# Patient Record
Sex: Female | Born: 1950 | Race: White | Hispanic: No | Marital: Married | Smoking: Never smoker
Health system: Southern US, Community
[De-identification: ages and names within clinical notes are randomized; demographics above are authoritative.]

## PROBLEM LIST (undated history)

## (undated) DIAGNOSIS — M1990 Unspecified osteoarthritis, unspecified site: Secondary | ICD-10-CM

## (undated) DIAGNOSIS — E785 Hyperlipidemia, unspecified: Secondary | ICD-10-CM

## (undated) DIAGNOSIS — E875 Hyperkalemia: Secondary | ICD-10-CM

## (undated) DIAGNOSIS — E039 Hypothyroidism, unspecified: Secondary | ICD-10-CM

## (undated) DIAGNOSIS — Z0000 Encounter for general adult medical examination without abnormal findings: Secondary | ICD-10-CM

## (undated) DIAGNOSIS — R7309 Other abnormal glucose: Secondary | ICD-10-CM

## (undated) DIAGNOSIS — E079 Disorder of thyroid, unspecified: Secondary | ICD-10-CM

## (undated) DIAGNOSIS — D751 Secondary polycythemia: Secondary | ICD-10-CM

## (undated) DIAGNOSIS — M6530 Trigger finger, unspecified finger: Secondary | ICD-10-CM

## (undated) DIAGNOSIS — E663 Overweight: Secondary | ICD-10-CM

## (undated) DIAGNOSIS — I10 Essential (primary) hypertension: Secondary | ICD-10-CM

## (undated) HISTORY — DX: Encounter for general adult medical examination without abnormal findings: Z00.00

## (undated) HISTORY — DX: Essential (primary) hypertension: I10

## (undated) HISTORY — PX: TONSILECTOMY, ADENOIDECTOMY, BILATERAL MYRINGOTOMY AND TUBES: SHX2538

## (undated) HISTORY — DX: Other abnormal glucose: R73.09

## (undated) HISTORY — DX: Disorder of thyroid, unspecified: E07.9

## (undated) HISTORY — PX: TONSILLECTOMY: SUR1361

## (undated) HISTORY — DX: Unspecified osteoarthritis, unspecified site: M19.90

## (undated) HISTORY — DX: Trigger finger, unspecified finger: M65.30

## (undated) HISTORY — DX: Overweight: E66.3

## (undated) HISTORY — DX: Hyperkalemia: E87.5

## (undated) HISTORY — PX: OTHER SURGICAL HISTORY: SHX169

## (undated) HISTORY — DX: Hyperlipidemia, unspecified: E78.5

## (undated) HISTORY — DX: Secondary polycythemia: D75.1

---

## 2003-05-07 ENCOUNTER — Ambulatory Visit (HOSPITAL_COMMUNITY): Admission: RE | Admit: 2003-05-07 | Discharge: 2003-05-07 | Payer: Self-pay

## 2003-09-18 ENCOUNTER — Emergency Department (HOSPITAL_COMMUNITY): Admission: EM | Admit: 2003-09-18 | Discharge: 2003-09-18 | Payer: Self-pay | Admitting: Emergency Medicine

## 2004-04-30 ENCOUNTER — Ambulatory Visit (HOSPITAL_COMMUNITY): Admission: RE | Admit: 2004-04-30 | Discharge: 2004-04-30 | Payer: Self-pay | Admitting: Unknown Physician Specialty

## 2004-07-11 ENCOUNTER — Encounter (INDEPENDENT_AMBULATORY_CARE_PROVIDER_SITE_OTHER): Payer: Self-pay | Admitting: Family Medicine

## 2004-07-11 LAB — HM COLONOSCOPY: HM Colonoscopy: NORMAL

## 2004-07-11 LAB — CONVERTED CEMR LAB: Pap Smear: NORMAL

## 2005-02-16 ENCOUNTER — Encounter (INDEPENDENT_AMBULATORY_CARE_PROVIDER_SITE_OTHER): Payer: Self-pay | Admitting: Family Medicine

## 2005-04-01 ENCOUNTER — Ambulatory Visit: Payer: Self-pay | Admitting: Internal Medicine

## 2005-04-25 ENCOUNTER — Ambulatory Visit (HOSPITAL_COMMUNITY): Admission: RE | Admit: 2005-04-25 | Discharge: 2005-04-25 | Payer: Self-pay | Admitting: Internal Medicine

## 2005-04-25 ENCOUNTER — Ambulatory Visit: Payer: Self-pay | Admitting: Internal Medicine

## 2005-05-05 ENCOUNTER — Ambulatory Visit (HOSPITAL_COMMUNITY): Admission: RE | Admit: 2005-05-05 | Discharge: 2005-05-05 | Payer: Self-pay | Admitting: Unknown Physician Specialty

## 2005-10-15 ENCOUNTER — Encounter (INDEPENDENT_AMBULATORY_CARE_PROVIDER_SITE_OTHER): Payer: Self-pay | Admitting: Family Medicine

## 2006-05-18 ENCOUNTER — Ambulatory Visit: Payer: Self-pay | Admitting: Family Medicine

## 2006-06-07 ENCOUNTER — Encounter: Payer: Self-pay | Admitting: Family Medicine

## 2006-06-08 ENCOUNTER — Ambulatory Visit (HOSPITAL_COMMUNITY): Admission: RE | Admit: 2006-06-08 | Discharge: 2006-06-08 | Payer: Self-pay | Admitting: Family Medicine

## 2006-06-08 ENCOUNTER — Encounter (INDEPENDENT_AMBULATORY_CARE_PROVIDER_SITE_OTHER): Payer: Self-pay | Admitting: Family Medicine

## 2006-06-08 LAB — CONVERTED CEMR LAB
Albumin: 4.3 g/dL
Alkaline Phosphatase: 95 units/L
BUN: 12 mg/dL
Calcium: 9.6 mg/dL
Chloride: 102 meq/L
Creatinine, Ser: 0.94 mg/dL
Potassium: 4.9 meq/L
Sodium: 141 meq/L
Total Bilirubin: 0.5 mg/dL
Total Protein: 6.9 g/dL

## 2006-06-09 ENCOUNTER — Encounter (INDEPENDENT_AMBULATORY_CARE_PROVIDER_SITE_OTHER): Payer: Self-pay | Admitting: Family Medicine

## 2006-06-09 LAB — CONVERTED CEMR LAB
Cholesterol: 209 mg/dL
HCT: 42.7 %
HDL: 73 mg/dL
Hemoglobin: 14.5 g/dL
LDL Cholesterol: 128 mg/dL
MCV: 84.4 fL
Platelets: 231 10*3/uL
RBC: 5.06 M/uL
Triglycerides: 39 mg/dL
WBC: 4.7 10*3/uL

## 2006-06-15 ENCOUNTER — Ambulatory Visit: Payer: Self-pay | Admitting: Family Medicine

## 2006-07-10 ENCOUNTER — Ambulatory Visit (HOSPITAL_COMMUNITY): Admission: RE | Admit: 2006-07-10 | Discharge: 2006-07-10 | Payer: Self-pay | Admitting: Family Medicine

## 2006-07-13 ENCOUNTER — Encounter (INDEPENDENT_AMBULATORY_CARE_PROVIDER_SITE_OTHER): Payer: Self-pay | Admitting: Family Medicine

## 2006-07-31 ENCOUNTER — Ambulatory Visit: Payer: Self-pay | Admitting: Family Medicine

## 2006-08-01 ENCOUNTER — Encounter (INDEPENDENT_AMBULATORY_CARE_PROVIDER_SITE_OTHER): Payer: Self-pay | Admitting: Family Medicine

## 2006-08-01 LAB — CONVERTED CEMR LAB: TSH: 4.592 microintl units/mL (ref 0.350–5.50)

## 2006-09-11 ENCOUNTER — Ambulatory Visit: Payer: Self-pay | Admitting: Family Medicine

## 2006-09-11 DIAGNOSIS — E079 Disorder of thyroid, unspecified: Secondary | ICD-10-CM | POA: Insufficient documentation

## 2006-09-11 LAB — CONVERTED CEMR LAB
Cholesterol, target level: 200 mg/dL
HDL goal, serum: 40 mg/dL
LDL Goal: 160 mg/dL

## 2006-09-13 LAB — CONVERTED CEMR LAB
Free T4: 1.18 ng/dL (ref 0.89–1.80)
T3, Free: 2.6 pg/mL (ref 2.3–4.2)
TSH: 6.316 microintl units/mL — ABNORMAL HIGH (ref 0.350–5.50)

## 2006-12-07 ENCOUNTER — Encounter (INDEPENDENT_AMBULATORY_CARE_PROVIDER_SITE_OTHER): Payer: Self-pay | Admitting: Family Medicine

## 2006-12-14 ENCOUNTER — Ambulatory Visit: Payer: Self-pay | Admitting: Family Medicine

## 2006-12-15 ENCOUNTER — Encounter (INDEPENDENT_AMBULATORY_CARE_PROVIDER_SITE_OTHER): Payer: Self-pay | Admitting: Family Medicine

## 2006-12-15 ENCOUNTER — Telehealth (INDEPENDENT_AMBULATORY_CARE_PROVIDER_SITE_OTHER): Payer: Self-pay | Admitting: Family Medicine

## 2006-12-15 LAB — CONVERTED CEMR LAB
Free T4: 1.13 ng/dL (ref 0.89–1.80)
T3, Free: 2.6 pg/mL (ref 2.3–4.2)
TSH: 6.74 microintl units/mL — ABNORMAL HIGH (ref 0.350–5.50)

## 2007-02-20 ENCOUNTER — Ambulatory Visit: Payer: Self-pay | Admitting: Family Medicine

## 2007-04-30 ENCOUNTER — Ambulatory Visit: Payer: Self-pay | Admitting: Family Medicine

## 2007-05-01 ENCOUNTER — Telehealth (INDEPENDENT_AMBULATORY_CARE_PROVIDER_SITE_OTHER): Payer: Self-pay | Admitting: *Deleted

## 2007-05-01 LAB — CONVERTED CEMR LAB: TSH: 4.607 microintl units/mL (ref 0.350–5.50)

## 2007-05-19 ENCOUNTER — Encounter (INDEPENDENT_AMBULATORY_CARE_PROVIDER_SITE_OTHER): Payer: Self-pay | Admitting: Family Medicine

## 2007-06-11 ENCOUNTER — Encounter (INDEPENDENT_AMBULATORY_CARE_PROVIDER_SITE_OTHER): Payer: Self-pay | Admitting: Family Medicine

## 2007-06-12 ENCOUNTER — Telehealth (INDEPENDENT_AMBULATORY_CARE_PROVIDER_SITE_OTHER): Payer: Self-pay | Admitting: *Deleted

## 2007-06-12 LAB — CONVERTED CEMR LAB
ALT: 20 U/L
AST: 23 U/L
Albumin: 4.3 g/dL
Alkaline Phosphatase: 93 U/L
BUN: 16 mg/dL
Basophils Absolute: 0 K/uL
Basophils Relative: 0 %
CO2: 25 meq/L
Calcium: 9.7 mg/dL
Chloride: 102 meq/L
Cholesterol: 210 mg/dL — ABNORMAL HIGH
Creatinine, Ser: 0.93 mg/dL
Eosinophils Absolute: 0.1 K/uL — ABNORMAL LOW
Eosinophils Relative: 2 %
Glucose, Bld: 103 mg/dL — ABNORMAL HIGH
HCT: 44 %
HDL: 73 mg/dL
Hemoglobin: 15.1 g/dL — ABNORMAL HIGH
LDL Cholesterol: 126 mg/dL — ABNORMAL HIGH
Lymphocytes Relative: 44 %
Lymphs Abs: 2.1 K/uL
MCHC: 34.3 g/dL
MCV: 84.9 fL
Monocytes Absolute: 0.3 K/uL
Monocytes Relative: 7 %
Neutro Abs: 2.2 K/uL
Neutrophils Relative %: 47 %
Platelets: 222 K/uL
Potassium: 5.8 meq/L — ABNORMAL HIGH
RBC: 5.18 M/uL — ABNORMAL HIGH
RDW: 13.2 %
Sodium: 140 meq/L
Total Bilirubin: 0.7 mg/dL
Total CHOL/HDL Ratio: 2.9
Total Protein: 7.2 g/dL
Triglycerides: 54 mg/dL
VLDL: 11 mg/dL
WBC: 4.7 10*3/microliter

## 2007-06-14 ENCOUNTER — Telehealth (INDEPENDENT_AMBULATORY_CARE_PROVIDER_SITE_OTHER): Payer: Self-pay | Admitting: *Deleted

## 2007-06-14 ENCOUNTER — Encounter (INDEPENDENT_AMBULATORY_CARE_PROVIDER_SITE_OTHER): Payer: Self-pay | Admitting: Family Medicine

## 2007-06-14 ENCOUNTER — Ambulatory Visit: Payer: Self-pay | Admitting: Family Medicine

## 2007-06-14 ENCOUNTER — Other Ambulatory Visit: Admission: RE | Admit: 2007-06-14 | Discharge: 2007-06-14 | Payer: Self-pay | Admitting: Family Medicine

## 2007-06-15 ENCOUNTER — Telehealth (INDEPENDENT_AMBULATORY_CARE_PROVIDER_SITE_OTHER): Payer: Self-pay | Admitting: *Deleted

## 2007-06-15 LAB — CONVERTED CEMR LAB: Potassium: 4.1 meq/L (ref 3.5–5.3)

## 2007-06-19 ENCOUNTER — Telehealth (INDEPENDENT_AMBULATORY_CARE_PROVIDER_SITE_OTHER): Payer: Self-pay | Admitting: *Deleted

## 2007-06-19 LAB — CONVERTED CEMR LAB: Pap Smear: NORMAL

## 2007-07-13 ENCOUNTER — Ambulatory Visit (HOSPITAL_COMMUNITY): Admission: RE | Admit: 2007-07-13 | Discharge: 2007-07-13 | Payer: Self-pay | Admitting: Family Medicine

## 2007-07-17 ENCOUNTER — Telehealth (INDEPENDENT_AMBULATORY_CARE_PROVIDER_SITE_OTHER): Payer: Self-pay | Admitting: *Deleted

## 2007-09-13 ENCOUNTER — Ambulatory Visit: Payer: Self-pay | Admitting: Family Medicine

## 2007-09-13 DIAGNOSIS — E663 Overweight: Secondary | ICD-10-CM | POA: Insufficient documentation

## 2007-09-14 ENCOUNTER — Telehealth (INDEPENDENT_AMBULATORY_CARE_PROVIDER_SITE_OTHER): Payer: Self-pay | Admitting: *Deleted

## 2007-09-14 LAB — CONVERTED CEMR LAB: TSH: 4.849 microintl units/mL (ref 0.350–5.50)

## 2008-03-20 ENCOUNTER — Encounter (INDEPENDENT_AMBULATORY_CARE_PROVIDER_SITE_OTHER): Payer: Self-pay | Admitting: Family Medicine

## 2008-03-21 ENCOUNTER — Encounter (INDEPENDENT_AMBULATORY_CARE_PROVIDER_SITE_OTHER): Payer: Self-pay | Admitting: Family Medicine

## 2008-04-14 ENCOUNTER — Ambulatory Visit: Payer: Self-pay | Admitting: Family Medicine

## 2008-04-14 ENCOUNTER — Encounter (INDEPENDENT_AMBULATORY_CARE_PROVIDER_SITE_OTHER): Payer: Self-pay | Admitting: Family Medicine

## 2008-04-14 ENCOUNTER — Encounter: Payer: Self-pay | Admitting: Family Medicine

## 2008-04-15 ENCOUNTER — Encounter (INDEPENDENT_AMBULATORY_CARE_PROVIDER_SITE_OTHER): Payer: Self-pay | Admitting: Family Medicine

## 2008-04-15 LAB — CONVERTED CEMR LAB: TSH: 3.879 microintl units/mL (ref 0.350–4.50)

## 2008-06-23 ENCOUNTER — Telehealth (INDEPENDENT_AMBULATORY_CARE_PROVIDER_SITE_OTHER): Payer: Self-pay | Admitting: *Deleted

## 2008-07-14 ENCOUNTER — Ambulatory Visit: Payer: Self-pay | Admitting: Family Medicine

## 2008-11-17 ENCOUNTER — Ambulatory Visit: Payer: Self-pay | Admitting: Family Medicine

## 2009-02-24 ENCOUNTER — Encounter (INDEPENDENT_AMBULATORY_CARE_PROVIDER_SITE_OTHER): Payer: Self-pay | Admitting: Family Medicine

## 2009-02-25 ENCOUNTER — Encounter (INDEPENDENT_AMBULATORY_CARE_PROVIDER_SITE_OTHER): Payer: Self-pay | Admitting: Family Medicine

## 2009-02-25 LAB — CONVERTED CEMR LAB
ALT: 35 units/L (ref 0–35)
AST: 29 units/L (ref 0–37)
Albumin: 4.4 g/dL (ref 3.5–5.2)
Alkaline Phosphatase: 119 units/L — ABNORMAL HIGH (ref 39–117)
BUN: 14 mg/dL (ref 6–23)
Basophils Absolute: 0 10*3/uL (ref 0.0–0.1)
Basophils Relative: 0 % (ref 0–1)
CO2: 26 meq/L (ref 19–32)
Calcium: 10 mg/dL (ref 8.4–10.5)
Chloride: 102 meq/L (ref 96–112)
Cholesterol: 220 mg/dL — ABNORMAL HIGH (ref 0–200)
Creatinine, Ser: 0.85 mg/dL (ref 0.40–1.20)
Eosinophils Absolute: 0.1 10*3/uL (ref 0.0–0.7)
Eosinophils Relative: 2 % (ref 0–5)
Glucose, Bld: 99 mg/dL (ref 70–99)
HCT: 44.4 % (ref 36.0–46.0)
HDL: 69 mg/dL (ref >39)
Hemoglobin: 14.6 g/dL (ref 12.0–15.0)
LDL Cholesterol: 136 mg/dL — ABNORMAL HIGH (ref 0–99)
Lymphocytes Relative: 38 % (ref 12–46)
Lymphs Abs: 2.1 10*3/uL (ref 0.7–4.0)
MCHC: 32.9 g/dL (ref 30.0–36.0)
MCV: 85.9 fL (ref 78.0–100.0)
Monocytes Absolute: 0.4 10*3/uL (ref 0.1–1.0)
Monocytes Relative: 6 % (ref 3–12)
Neutro Abs: 3 10*3/uL (ref 1.7–7.7)
Neutrophils Relative %: 54 % (ref 43–77)
Platelets: 251 10*3/uL (ref 150–400)
Potassium: 4.9 meq/L (ref 3.5–5.3)
RBC: 5.17 M/uL — ABNORMAL HIGH (ref 3.87–5.11)
RDW: 13.8 % (ref 11.5–15.5)
Sodium: 140 meq/L (ref 135–145)
TSH: 8.841 microintl units/mL — ABNORMAL HIGH (ref 0.350–4.500)
Total Bilirubin: 0.5 mg/dL (ref 0.3–1.2)
Total CHOL/HDL Ratio: 3.2
Total Protein: 7.3 g/dL (ref 6.0–8.3)
Triglycerides: 75 mg/dL (ref <150)
VLDL: 15 mg/dL (ref 0–40)
WBC: 5.5 10*3/uL (ref 4.0–10.5)

## 2009-02-26 LAB — CONVERTED CEMR LAB
Free T4: 0.98 ng/dL (ref 0.80–1.80)
T3, Free: 2.9 pg/mL (ref 2.3–4.2)

## 2009-08-13 ENCOUNTER — Ambulatory Visit: Payer: Self-pay | Admitting: Family Medicine

## 2009-08-20 ENCOUNTER — Ambulatory Visit (HOSPITAL_COMMUNITY): Admission: RE | Admit: 2009-08-20 | Discharge: 2009-08-20 | Payer: Self-pay | Admitting: Family Medicine

## 2009-12-17 ENCOUNTER — Other Ambulatory Visit: Admission: RE | Admit: 2009-12-17 | Discharge: 2009-12-17 | Payer: Self-pay | Admitting: Family Medicine

## 2009-12-17 ENCOUNTER — Ambulatory Visit: Payer: Self-pay | Admitting: Family Medicine

## 2009-12-17 ENCOUNTER — Telehealth: Payer: Self-pay | Admitting: Family Medicine

## 2009-12-17 DIAGNOSIS — E782 Mixed hyperlipidemia: Secondary | ICD-10-CM | POA: Insufficient documentation

## 2009-12-17 DIAGNOSIS — N950 Postmenopausal bleeding: Secondary | ICD-10-CM | POA: Insufficient documentation

## 2009-12-17 LAB — CONVERTED CEMR LAB
BUN: 15 mg/dL (ref 6–23)
Basophils Absolute: 0 10*3/uL (ref 0.0–0.1)
Basophils Relative: 1 % (ref 0–1)
CO2: 26 meq/L (ref 19–32)
Calcium: 9.7 mg/dL (ref 8.4–10.5)
Chloride: 102 meq/L (ref 96–112)
Cholesterol: 225 mg/dL — ABNORMAL HIGH (ref 0–200)
Creatinine, Ser: 0.83 mg/dL (ref 0.40–1.20)
Eosinophils Absolute: 0.1 10*3/uL (ref 0.0–0.7)
Eosinophils Relative: 2 % (ref 0–5)
Glucose, Bld: 98 mg/dL (ref 70–99)
HCT: 45.6 % (ref 36.0–46.0)
HDL: 76 mg/dL (ref >39)
Hemoglobin: 14.9 g/dL (ref 12.0–15.0)
LDL Cholesterol: 140 mg/dL — ABNORMAL HIGH (ref 0–99)
Lymphocytes Relative: 41 % (ref 12–46)
Lymphs Abs: 2.3 10*3/uL (ref 0.7–4.0)
MCHC: 32.7 g/dL (ref 30.0–36.0)
MCV: 86.7 fL (ref 78.0–100.0)
Monocytes Absolute: 0.3 10*3/uL (ref 0.1–1.0)
Monocytes Relative: 6 % (ref 3–12)
Neutro Abs: 2.8 10*3/uL (ref 1.7–7.7)
Neutrophils Relative %: 51 % (ref 43–77)
Platelets: 249 10*3/uL (ref 150–400)
Potassium: 5.3 meq/L (ref 3.5–5.3)
RBC: 5.26 M/uL — ABNORMAL HIGH (ref 3.87–5.11)
RDW: 13.8 % (ref 11.5–15.5)
Sodium: 138 meq/L (ref 135–145)
TSH: 7.289 microintl units/mL — ABNORMAL HIGH (ref 0.350–4.500)
Total CHOL/HDL Ratio: 3
Triglycerides: 43 mg/dL (ref <150)
VLDL: 9 mg/dL (ref 0–40)
Vit D, 25-Hydroxy: 42 ng/mL (ref 30–89)
WBC: 5.6 10*3/uL (ref 4.0–10.5)

## 2009-12-22 ENCOUNTER — Ambulatory Visit (HOSPITAL_COMMUNITY): Admission: RE | Admit: 2009-12-22 | Discharge: 2009-12-22 | Payer: Self-pay | Admitting: Family Medicine

## 2010-01-01 ENCOUNTER — Encounter: Payer: Self-pay | Admitting: Family Medicine

## 2010-01-18 ENCOUNTER — Ambulatory Visit: Payer: Self-pay | Admitting: Endocrinology

## 2010-01-18 DIAGNOSIS — E039 Hypothyroidism, unspecified: Secondary | ICD-10-CM | POA: Insufficient documentation

## 2010-01-18 DIAGNOSIS — E041 Nontoxic single thyroid nodule: Secondary | ICD-10-CM | POA: Insufficient documentation

## 2010-01-18 HISTORY — DX: Nontoxic single thyroid nodule: E04.1

## 2010-03-09 ENCOUNTER — Telehealth: Payer: Self-pay | Admitting: Endocrinology

## 2010-03-19 ENCOUNTER — Ambulatory Visit (HOSPITAL_COMMUNITY): Admission: RE | Admit: 2010-03-19 | Discharge: 2010-03-19 | Payer: Self-pay | Admitting: Obstetrics and Gynecology

## 2010-05-19 LAB — CONVERTED CEMR LAB
Cholesterol: 195 mg/dL (ref 0–200)
HDL: 64 mg/dL (ref >39)
LDL Cholesterol: 122 mg/dL — ABNORMAL HIGH (ref 0–99)
Total CHOL/HDL Ratio: 3
Triglycerides: 45 mg/dL (ref <150)
VLDL: 9 mg/dL (ref 0–40)

## 2010-05-21 ENCOUNTER — Ambulatory Visit: Payer: Self-pay | Admitting: Family Medicine

## 2010-05-24 ENCOUNTER — Encounter: Payer: Self-pay | Admitting: Family Medicine

## 2010-05-24 LAB — CONVERTED CEMR LAB
Free T4: 1.18 ng/dL (ref 0.80–1.80)
T3, Free: 2.6 pg/mL (ref 2.3–4.2)
TSH: 5.509 microintl units/mL — ABNORMAL HIGH (ref 0.350–4.500)

## 2010-05-25 ENCOUNTER — Encounter: Payer: Self-pay | Admitting: Family Medicine

## 2010-08-03 ENCOUNTER — Other Ambulatory Visit: Payer: Self-pay | Admitting: Family Medicine

## 2010-08-03 DIAGNOSIS — Z139 Encounter for screening, unspecified: Secondary | ICD-10-CM

## 2010-08-08 LAB — CONVERTED CEMR LAB
OCCULT 1: NEGATIVE
Pap Smear: NEGATIVE

## 2010-08-10 NOTE — Progress Notes (Signed)
  Phone Note Outgoing Call   Call placed by: dr simpson Summary of Call: Dr Cordie Grice , this pt who is 57 and [place] had approx 3 months ago a single episode of brown spotting with breast tenderness, does she need endometrial bx, her pelvic exam, manual appears nl, or will just a pelvic US suffice? Initial call taken by: Syliva Overman MD,  December 17, 2009 9:32 AM  Follow-up for Phone Call        she needs endometrial bx, i will refer her to dr Emelda Fear Follow-up by: Syliva Overman MD,  December 17, 2009 9:42 AM

## 2010-08-10 NOTE — Assessment & Plan Note (Signed)
Summary: NEW PATIENT   Vital Signs:  Patient profile:   60 year old female Menstrual status:  postmenopausal Height:      65 inches Weight:      163.75 pounds BMI:     27.35 O2 Sat:      98 % Pulse rate:   76 / minute Pulse rhythm:   regular Resp:     16 per minute BP sitting:   130 / 84  (right arm) Cuff size:   regular  Vitals Entered By: Everitt Amber Aug 24, 2009 1:30 PM)  Nutrition Counseling: Patient's BMI is greater than 25 and therefore counseled on weight management options. CC: New Patient Is Patient Diabetic? No Pain Assessment Patient in pain? no          Menstrual Status postmenopausal Last PAP Result normal   Primary Care Provider:  Syliva Overman MD  CC:  New Patient.  History of Present Illness: New pt evaluation for this pleasant femle, who is essntially fairly healthy with no specific health concerns at this time. She has been working on lifestyle change with excellent weight loss over the past several months, and intends to continue this. She is behind in immunisation and preventive screening tests, and this will be addressed also. Reports  that she is doing well. Denies recent fever or chills. Denies sinus pressure, nasal congestion , ear pain or sore throat. Denies chest congestion, or cough productive of sputum. Denies chest pain, palpitations, PND, orthopnea or leg swelling. Denies abdominal pain, nausea, vomitting, diarrhea or constipation. Denies change in bowel movements or bloody stool. Denies dysuria , frequency, incontinence or hesitancy. Denies  joint pain, swelling, or reduced mobility. Denies headaches, vertigo, seizures. Denies depression, anxiety or insomnia. Denies  rash, lesions, or itch.     Current Medications (verified): 1)  Daily Multiple Vitamins  Tabs (Multiple Vitamin) .... Once Daily 2)  Oscal 500/200 D-3 500-200 Mg-Unit Tabs (Calcium-Vitamin D) .... 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) OSTEOARTHRITIS (ICD-715.90) HYPERLIPIDEMIA (ICD-272.4)  Past Surgical History: Last updated: 06/07/2006 Tonsillectomy  Family History: Last updated: 08-24-09 Father: Dead 51, [place] Mom: Dead 71 CVA at 70 Siblings:  Sister: 46 Thyroid problems Brothers: 3 x Hyperlipidemia - age 18, 30 and 40  Social History: Last updated: Aug 24, 2009 Occupation: Book Biomedical engineer Married Never Smoked Alcohol use-yes - socially Drug use-no No children  Risk Factors: Smoking Status: never (09/11/2006)  Family History: Father: Dead 76, [place] Mom: Dead 73 CVA at 70 Siblings:  Sister: 61 Thyroid problems Brothers: 3 x Hyperlipidemia - age 74, 7 and 66  Social History: Occupation: Book Biomedical engineer Married Never Smoked Alcohol use-yes - socially Drug use-no No children  Review of Systems      See HPI Eyes:  Denies discharge, eye pain, and red eye. Heme:  Denies abnormal bruising, bleeding, and enlarge lymph nodes. Allergy:  Denies hives or rash and sneezing.  Physical Exam  General:  Well-developed,well-nourished,in no acute distress; alert,appropriate and cooperative throughout examination HEENT: No facial asymmetry,  EOMI, No sinus tenderness, TM's Clear, oropharynx  pink and moist.   Chest: Clear to auscultation bilaterally.  CVS: S1, S2, No murmurs, No S3.   Abd: Soft, Nontender.  MS: Adequate ROM spine, hips, shoulders and knees.  Ext: No edema.  CNS: CN 2-12 intact, power tone and sensation normal throughout.   Skin: Intact, no visible lesions or rashes.  Psych: Good eye contact, normal affect.  Memory intact, not anxious or depressed appearing.    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    Problem # 2:  OVERWEIGHT (ICD-278.02) Assessment: Improved  Ht: 65 (08/13/2009)   Wt: 163.75 (08/13/2009)   BMI: 27.35 (08/13/2009)  Problem # 3:  HYPERLIPIDEMIA (ICD-272.4) Assessment: Comment Only  Orders: T-Lipid Profile [id number])  Labs Reviewed: SGOT: 29 (02/24/2009)   SGPT: 35 (02/24/2009)  Lipid Goals: Chol Goal: 200 (09/11/2006)   HDL Goal: 40 (09/11/2006)   LDL Goal: 160 (09/11/2006)   TG Goal: 150 (09/11/2006), low fat diet and regular exercise discussed and encouraged, rept lipids needed   Prior 10 Yr Risk Heart Disease: 7 % (09/13/2007)   HDL:69 (02/24/2009), 73 (06/11/2007)  LDL:136 (02/24/2009), 126 (06/11/2007)  Chol:220 (02/24/2009), 210 (06/11/2007)  Trig:75 (02/24/2009), 54 (06/11/2007)  Complete Medication List: 1)  Daily Multiple Vitamins Tabs (Multiple vitamin) .... Once daily 2)  Oscal 500/200 D-3 500-200 Mg-unit Tabs (Calcium-vitamin d) .... Three times a day  Other Orders: T-Basic Metabolic Panel [id number]) T-CBC w/Diff [id number]) T-TSH [id number]) T-Vitamin D (25-Hydroxy) [phone]) Radiology Referral (Radiology) Tdap => 23yrs IM (02725) Admin 1st Vaccine (36644) Admin 1st Vaccine (State) [phone])  Patient Instructions: 1)  CPE in 4 months 2)    3)  BMP prior to visit, ICD-9: 4)  Lipid Panel prior to visit, ICD-9:  fasting asap 5)  TSH prior to visit, ICD-9: 6)  CBC w/ Diff prior to visit, ICD-9: 7)  Vit D level 8)  TdAP  today. 9)  Pls get your flu shot if possible. 10)  Schedule your mammogram. 11)  It is important that you  exercise regularly at least 30 minutes 3 times a week. If you develop chest pain, have severe difficulty breathing, or feel very tired , stop exercising immediately and seek medical attention. 12)  You need to lose weight. Consider a lower calorie diet and regular exercise. Goal is 10 pounds      Tetanus/Td Vaccine    Vaccine Type: Tdap    Site: right deltoid    Mfr: Sanofi Pasteur    Dose: 0.5 ml    Route: IM    Given by: Everitt Amber    Exp. Date: 08/08/2011    Lot #: [id number]

## 2010-08-10 NOTE — Letter (Signed)
Summary: add lab on  add lab on   Imported By: Luann Bullins 05/25/2010 08:15:46  _____________________________________________________________________  External Attachment:    Type:   Image     Comment:   External Document

## 2010-08-10 NOTE — Letter (Signed)
Summary: LAB ADD ON  LAB ADD ON   Imported By: Lind Guest 05/24/2010 10:30:53  _____________________________________________________________________  External Attachment:    Type:   Image     Comment:   External Document

## 2010-08-10 NOTE — Assessment & Plan Note (Signed)
Summary: office visit   Vital Signs:  Patient profile:   60 year old female Menstrual status:  postmenopausal Height:      65 inches Weight:      175.75 pounds BMI:     29.35 O2 Sat:      97 % on Room air Pulse rate:   79 / minute Pulse rhythm:   regular Resp:     16 per minute BP sitting:   138 / 80  (right arm)  Vitals Entered By: Mauricia Area CMA (May 21, 2010 8:47 AM)  Nutrition Counseling: Patient's BMI is greater than 25 and therefore counseled on weight management options.  O2 Flow:  Room air CC: follow up   Primary Care Provider:  Syliva Overman MD  CC:  follow up.  History of Present Illness: Reports  that she has been doing fairly well. She has had gynae eval for PMB which was negative, and she has been to endocrine about her thyroid gland, wishes a 2nd opinion next year.Currently on no supplement, intolerant, feels well. Denies recent fever or chills. Denies sinus pressure, nasal congestion , ear pain or sore throat. Denies chest congestion, or cough productive of sputum. Denies chest pain, palpitations, PND, orthopnea or leg swelling. Denies abdominal pain, nausea, vomitting, diarrhea or constipation. Denies change in bowel movements or bloody stool. Denies dysuria , frequency, incontinence or hesitancy. Denies  joint pain, swelling, or reduced mobility. Denies headaches, vertigo, seizures. Denies depression, anxiety or insomnia. Denies  rash, lesions, or itch. Julie May has recently joined [employer], and hasalready started successful weight loss.     Current Medications (verified): 1)  Daily Multiple Vitamins  Tabs (Multiple Vitamin) .... Once Daily  Allergies (verified): No Known Drug Allergies  Review of Systems      See HPI Eyes:  Denies blurring, discharge, eye pain, and red eye. Endo:  Denies cold intolerance, excessive hunger, excessive thirst, excessive urination, and heat intolerance. Heme:  Denies abnormal bruising and  bleeding. Allergy:  Denies hives or rash and itching eyes.  Physical Exam  General:  Well-developed,overweight,in no acute distress; alert,appropriate and cooperative throughout examination HEENT: No facial asymmetry,  EOMI, No sinus tenderness, TM's Clear, oropharynx  pink and moist.   Chest: Clear to auscultation bilaterally.  CVS: S1, S2, No murmurs, No S3.   Abd: Soft, Nontender.  MS: Adequate ROM spine, hips, shoulders and knees.  Ext: No edema.   CNS: CN 2-12 intact, power tone and sensation normal throughout.   Skin: Intact, no visible lesions or rashes.  Psych: Good eye contact, normal affect.  Memory intact, not anxious or depressed appearing.    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 1 once daily, intolerant of med  Orders: T-TSH ([id number]) T-TSH [id number])  Labs Reviewed: TSH: 7.289 (12/15/2009)    Chol: 195 (05/18/2010)   HDL: 64 (05/18/2010)   LDL: 122 (05/18/2010)   TG: 45 (05/18/2010)  Problem # 3:  HYPERLIPIDEMIA (ICD-272.4) Assessment: Improved  Labs Reviewed: SGOT: 29 (02/24/2009)   SGPT: 35 (02/24/2009) Low fat dietdiscussed and encouraged  Lipid Goals: Chol Goal:  200 (09/11/2006)   HDL Goal: 40 (09/11/2006)   LDL Goal: 160 (09/11/2006)   TG Goal: 150 (09/11/2006)  Prior 10 Yr Risk Heart Disease: 7 % (09/13/2007)   HDL:64 (05/18/2010), 76 (12/15/2009)  LDL:122 (05/18/2010), 140 (12/15/2009)  Chol:195 (05/18/2010), 225 (12/15/2009)  Trig:45 (05/18/2010), 43 (12/15/2009)  Problem # 4:  POSTMENOPAUSAL BLEEDING (ICD-627.1) Assessment: Comment  Only neg gynae eval in 2011  Problem # 5:  OVERWEIGHT (ICD-278.02) Assessment: Deteriorated  Ht: 65 (05/21/2010)   Wt: 175.75 (05/21/2010)   BMI: 29.35 (05/21/2010) therapeutic lifestyle change discussed and encouraged  Complete Medication List: 1)  Daily Multiple Vitamins Tabs (Multiple vitamin) .... Once daily  Other Orders: T-Glucose, Blood ([id number]) T- Hemoglobin A1C ([id number])  Patient Instructions: 1)  Follow up appointment in 5.40months 2)  TSH in 3 monrths. 3)  TSH and blood sugar in 5.5 months Lipid Panel prior to visit, ICD-9: 4)  It is important that you exercise regularly at least 20 minutes 5 times a week. If you develop chest pain, have severe difficulty breathing, or feel very tired , stop exercising immediately and seek medical attention. 5)  You need to lose weight. Consider a lower calorie diet and regular exercise.    Orders Added: 1)  Est. Patient Level IV [65784] 2)  T-TSH [[id number]] 3)  T-TSH [32440-10272] 4)  T-Glucose, Blood [[id number]] 5)  T- Hemoglobin A1C [83036-23375]

## 2010-08-10 NOTE — Letter (Signed)
Summary: [facility]  Office Visit   Imported By: Lind Guest 01/12/2010 17:03:38  _____________________________________________________________________  External Attachment:    Type:   Image     Comment:   External Document

## 2010-08-10 NOTE — Assessment & Plan Note (Signed)
Summary: physical   Vital Signs:  Patient profile:   60 year old female Menstrual status:  postmenopausal Height:      65 inches Weight:      166.25 pounds BMI:     27.77 O2 Sat:      99 % Pulse rate:   80 / minute Pulse rhythm:   regular Resp:     16 per minute BP sitting:   118 / 80  (left arm)  Vitals Entered By: Everitt Amber LPN (December 17, 6960 8:27 AM)  Nutrition Counseling: Patient's BMI is greater than 25 and therefore counseled on weight management options. CC: CPE  Vision Screening:Left eye with correction: 20 / 25 Right eye with correction: 20 / 20 Both eyes with correction: 20 / 25  Color vision testing: normal      Vision Entered By: Everitt Amber LPN (December 18, 9526 8:31 AM)   Primary Care Provider:  Syliva Overman MD  CC:  CPE.  History of Present Illness: Reports  that she has been  doing well. she is concerned about weight gain, however she has not been diligent as far as lifestyle change to facilitate weight loss.  Denies recent fever or chills. Denies sinus pressure, nasal congestion , ear pain or sore throat. Denies chest congestion, or cough productive of sputum. Denies chest pain, palpitations, PND, orthopnea or leg swelling. Denies abdominal pain, nausea, vomitting, diarrhea or constipation. Denies change in bowel movements or bloody stool. Denies dysuria , frequency, incontinence or hesitancy.She did have a recent single episode of brown vag spotting though postmenopausal. Denies  joint pain, swelling, or reduced mobility. Denies headaches, vertigo, seizures. Denies depression, anxiety or insomnia. Denies  rash, lesions, or itch. she plans to work on low fat diet as far as hyperlipidemia isconcerned, sh was intolerant of meds. She hasquestions about the need for thyroid replacement     Current Medications (verified): 1)  Daily Multiple Vitamins  Tabs (Multiple Vitamin) .... Once Daily 2)  Oscal 500/200 D-3 500-200 Mg-Unit Tabs  (Calcium-Vitamin D) .... Three Times A Day  Allergies (verified): No Known Drug Allergies  Review of Systems      See HPI General:  Denies chills, fatigue, fever, malaise, sleep disorder, weakness, and weight loss. Eyes:  father had glaucoma. ENT:  Denies hoarseness, nasal congestion, sinus pressure, and sore throat. CV:  Denies chest pain or discomfort, difficulty breathing while lying down, fatigue, palpitations, shortness of breath with exertion, and swelling of feet; father died of mI at age 26, no known previous heart ds. Resp:  Denies cough, sputum productive, and wheezing. GI:  Denies abdominal pain, change in bowel habits, constipation, diarrhea, nausea, and vomiting. GU:  Complains of abnormal vaginal bleeding; one episode of brown spotting associated with breast tenderness in the past3 months, first ever incideent since she has been menopausal. MS:  Denies joint pain, low back pain, mid back pain, muscle weakness, and stiffness. Derm:  Denies itching, lesion(s), and rash. Neuro:  Denies headaches, memory loss, seizures, sensation of room spinning, and tingling. Psych:  Denies anxiety and depression. Endo:  Denies cold intolerance, excessive hunger, excessive thirst, excessive urination, heat intolerance, and weight change; h/o elevated tSH, tolerates only armor thyroid , but doesn't think it makes any difference in how she feels, wants to know if necessary. Heme:  Denies abnormal bruising and bleeding.  Physical Exam  General:  Well-developed,well-nourished,in no acute distress; alert,appropriate and cooperative throughout examination Head:  Normocephalic and atraumatic without obvious abnormalities.  No apparent alopecia or balding. Ears:  External ear exam shows no significant lesions or deformities.  Otoscopic examination reveals clear canals, tympanic membranes are intact bilaterally without bulging, retraction, inflammation or discharge. Hearing is grossly normal  bilaterally. Nose:  External nasal examination shows no deformity or inflammation. Nasal mucosa are pink and moist without lesions or exudates. Mouth:  Oral mucosa and oropharynx without lesions or exudates.  Teeth in good repair. Neck:  No deformities, masses, or tenderness noted.. Chest Wall:  No deformities, masses, or tenderness noted. Breasts:  No mass, nodules, thickening, tenderness, bulging, retraction, inflamation, nipple discharge or skin changes noted.   Lungs:  Normal respiratory effort, chest expands symmetrically. Lungs are clear to auscultation, no crackles or wheezes. Heart:  Normal rate and regular rhythm. S1 and S2 normal without gallop, murmur, click, rub or other extra sounds. Abdomen:  Bowel sounds positive,abdomen soft and non-tender without masses, organomegaly or hernias noted. Rectal:  No external abnormalities noted. Normal sphincter tone. No rectal masses or tenderness.guaic neg stool Genitalia:  Normal introitus for age, no external lesions, no vaginal discharge, mucosa pink and moist, no vaginal or cervical lesions, no vaginal atrophy, no friaility or hemorrhage, normal uterus size and position, no adnexal masses or tenderness Msk:  No deformity or scoliosis noted of thoracic or lumbar spine.   Pulses:  R and L carotid,radial,femoral,dorsalis pedis and posterior tibial pulses are full and equal bilaterally Extremities:  No clubbing, cyanosis, edema, or deformity noted with normal full range of motion of all joints.   Neurologic:  No cranial nerve deficits noted. Station and gait are normal. Plantar reflexes are down-going bilaterally. DTRs are symmetrical throughout. Sensory, motor and coordinative functions appear intact. Skin:  Intact without suspicious lesions or rashes Cervical Nodes:  No lymphadenopathy noted Axillary Nodes:  No palpable lymphadenopathy Inguinal Nodes:  No significant adenopathy Psych:  Cognition and judgment appear intact. Alert and cooperative  with normal attention span and concentration. No apparent delusions, illusions, hallucinations   Impression & Recommendations:  Problem # 1:  SPECIAL SCREENING FOR MALIGNANT NEOPLASMS COLON (ICD-V76.51) Assessment Comment Only  Orders: Hemoccult Guaiac-1 spec.(in office) (82270)negative  Problem # 2:  POSTMENOPAUSAL BLEEDING (ICD-627.1) Assessment: Comment Only  Orders: Gynecologic Referral (Gyn)though scant single episode need endometrial bx, discussed with pt and gynae  Problem # 3:  THYROID FUNCTION TEST, ABNORMAL (ICD-794.5)  Orders: Radiology Referral (Radiology)eval goiter/thyroid nodules  Problem # 4:  HYPERLIPIDEMIA (ICD-272.4) Assessment: Comment Only  Orders: EKG w/ Interpretation (93000) T-Lipid Profile ([id number])  Labs Reviewed: SGOT: 29 (02/24/2009)   SGPT: 35 (02/24/2009)  Lipid Goals: Chol Goal: 200 (09/11/2006)   HDL Goal: 40 (09/11/2006)   LDL Goal: 160 (09/11/2006)   TG Goal: 150 (09/11/2006)  Prior 10 Yr Risk Heart Disease: 7 % (09/13/2007)   HDL:76 (12/15/2009), 69 (02/24/2009)  LDL:140 (12/15/2009), 136 (02/24/2009)  Chol:225 (12/15/2009), 220 (02/24/2009)  Trig:43 (12/15/2009), 75 (02/24/2009)low fat diet discussed and encouraged  Problem # 5:  OVERWEIGHT (ICD-278.02) Assessment: Deteriorated  Ht: 65 (12/17/2009)   Wt: 166.25 (12/17/2009)   BMI: 27.77 (12/17/2009)  Problem # 6:  FAMILY HISTORY OF ISCHEMIC HEART DISEASE (ICD-V17.3) Assessment: Comment Only EKG; normals sinus rythm , no evidence of ischemia  Complete Medication List: 1)  Daily Multiple Vitamins Tabs (Multiple vitamin) .... Once daily 2)  Oscal 500/200 D-3 500-200 Mg-unit Tabs (Calcium-vitamin d) .... Three times a day  Other Orders: Pap Smear (28413)  Patient Instructions: 1)  F/u in 5.5 months. 2)  It is important that  you exercise regularly at least 20 minutes 5 times a week. If you develop chest pain, have severe difficulty breathing, or feel very tired , stop  exercising immediately and seek medical attention. 3)  You need to lose weight. Consider a lower calorie diet and regular exercise.  4)  pls follow a low fat diet, your cholesterol , total and bad are high. 5)  i will contact the gynaecologist and endocrinologst about your concerns and get back in touch with you in the next week,pls call if you do not hear from me. 6)  You are being redferred for an ultrasound of your thyroid gland. 7)  your eKG shows no evidence of heart disease. 8)  Lipid Panel prior to visit, ICD-9:fasting lipid in 5.5 months. 9)  zostavax today, pt elected to return for the vaccine    Laboratory Results    Stool - Occult Blood Hemmoccult #1: negative Date: 12/17/2009 Comments: [id number] 9r 8/11 118 [id number]

## 2010-08-10 NOTE — Progress Notes (Signed)
Summary: D/C medications  Phone Note Call from Patient Call back at Home Phone [phone]   Caller: Patient Summary of Call: Pt called to informed MD that she had to stop taking Levothyroxine after 3 days due to SE: Tingling in her hand when sleeping and 2 pre-syncope episodes. Initial call taken by: Margaret Pyle, CMA,  March 09, 2010 2:34 PM  Follow-up for Phone Call        in view of these sxs, i think you can abandon the medication.   Follow-up by: Minus Breeding MD,  March 09, 2010 4:06 PM  Additional Follow-up for Phone Call Additional follow up Details #1::        Pt informed and will follow up as needed Additional Follow-up by: Margaret Pyle, CMA,  March 09, 2010 4:15 PM

## 2010-08-10 NOTE — Assessment & Plan Note (Signed)
Summary: NEW ENDO CON/ THYROID NODULE/ UMR/ NWS  #   Vital Signs:  Patient profile:   60 year old female Menstrual status:  postmenopausal Height:      65 inches (165.10 cm) Weight:      167.50 pounds (76.14 kg) BMI:     27.97 O2 Sat:      97 % on Room air Temp:     97.1 degrees F (36.17 degrees C) oral Pulse rate:   68 / minute BP sitting:   118 / 84  (left arm) Cuff size:   regular  Vitals Entered By: Brenton Grills MA (January 18, 2010 1:24 PM)  O2 Flow:  Room air CC: New Endo/Thyroid Nodule/[facility]/pt is no longer taking Oscal/aj   Primary Provider:  Syliva Overman MD  CC:  New Endo/Thyroid Nodule/[facility]/pt is no longer taking Oscal/aj.  History of Present Illness: pt was noted on routine blood test to have elevated tsh.  she was rx'ed synthroid, which gave her few weeks of moderate "twitching," of her right eye, and associated nausea.  she tolerated armour thyroid well.  she has been off all thyroid medication x 3 years, and is asymptomatic.   Current Medications (verified): 1)  Daily Multiple Vitamins  Tabs (Multiple Vitamin) .... Once Daily 2)  Oscal 500/200 D-3 500-200 Mg-Unit Tabs (Calcium-Vitamin D) .... Three Times A Day  Allergies (verified): No Known Drug Allergies  Past History:  Past Medical History: Last updated: 09/13/2007 Current Problems:  OVERWEIGHT (ICD-278.02) HYPERKALEMIA (ICD-276.7) HYPERGLYCEMIA (ICD-790.29) POLYCYTHEMIA (ICD-289.0) WELL ADULT EXAM (ICD-V70.0) TRIGGER FINGER, LEFT THUMB (ICD-727.03) THYROID STIMULATING HORMONE, ABNORMAL (ICD-246.9) OSTEOARTHRITIS (ICD-715.90) HYPERLIPIDEMIA (ICD-272.4)  Family History: Reviewed history from 08/13/2009 and no changes required. Father: Dead 42, MI Mom: Dead 60 CVA at 12 Siblings:  Sister: 87 Thyroid problems--takes synthroid Brothers: 3 x Hyperlipidemia - age 79, 57 and 92 Arthritis Hypertension [place] Heart Disease  Social History: Reviewed  history from 08/13/2009 and no changes required. Occupation: Book Biomedical engineer Married Never Smoked Alcohol use-yes - socially Drug use-no No children  Review of Systems  The patient denies fever.         on synthroid, she also had abdominal cramps and acral numbness.   denies depression, hair loss, sob, memory loss, constipation, blurry vision, myalgias, dry skin, syncope, itching, rhinorrhea, and easy bruising.  she has lost weight, due to her efforts.   Physical Exam  General:  normal appearance.   Head:  head: no deformity eyes: no periorbital swelling, no proptosis external nose and ears are normal mouth: no lesion seen Neck:  thyroid has an irregular surface, but i doi not appreciated any nodule Lungs:  Clear to auscultation bilaterally. Normal respiratory effort.  Heart:  Regular rate and rhythm without murmurs or gallops noted. Normal S1,S2.   Msk:  muscle bulk and strength are grossly normal.  no obvious joint swelling.  gait is normal and steady  Extremities:  no edema no deformity Neurologic:  cn 2-12 grossly intact.   readily moves all 4's.   sensation is intact to touch on all 4's Skin:  normal texture and temp.  no rash.  not diaphoretic  Cervical Nodes:  No significant adenopathy.  Psych:  Alert and cooperative; normal mood and affect; normal attention span and concentration.   Additional Exam:  THYROID ULTRASOUND Tiny nonspecific left thyroid nodule.  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  uncertain etiology  Problem # 3:  abdominal cramps when she took a higher dosage of synthroid in the past. i believe this was a coincidence.  Medications Added to Medication List This Visit: 1)  Levothyroxine Sodium 25 Mcg Tabs (Levothyroxine sodium)  .Marland Kitchen.. 1 once daily  Other Orders: Consultation Level IV (29562)  Patient Instructions: 1)  take a 25 microgram thyroid hormone pill per day.   2)  recheck tsh in 4-6 weeks 244.9.  please call [phone] to hear your test results.   3)  you should have a repeat tsh, physical examination of the thyroid,  and thyroid ultrasound in 1 year.  i would be happy to do this, or dr simpson could do so.   Prescriptions: LEVOTHYROXINE SODIUM 25 MCG TABS (LEVOTHYROXINE SODIUM) 1 once daily  #90 x 3   Entered and Authorized by:   Minus Breeding MD   Signed by:   Minus Breeding MD on 01/18/2010   Method used:   Electronically to        [facility]* (retail)       [address]       [address] Shipping/mailing       [place], [place]  [address]       Ph: [phone]       Fax: [phone]   RxID:   [id number]

## 2010-08-18 ENCOUNTER — Encounter: Payer: Self-pay | Admitting: Family Medicine

## 2010-08-18 LAB — CONVERTED CEMR LAB
Free T4: 1.18 ng/dL (ref 0.80–1.80)
T3, Free: 2.8 pg/mL (ref 2.3–4.2)
TSH: 6.085 microintl units/mL — ABNORMAL HIGH (ref 0.350–4.500)

## 2010-08-19 ENCOUNTER — Encounter: Payer: Self-pay | Admitting: Family Medicine

## 2010-08-24 ENCOUNTER — Ambulatory Visit (HOSPITAL_COMMUNITY)
Admission: RE | Admit: 2010-08-24 | Discharge: 2010-08-24 | Disposition: A | Discharge status: Home / Self Care | Payer: 59 | Source: Ambulatory Visit | Attending: Family Medicine | Admitting: Family Medicine

## 2010-08-24 DIAGNOSIS — Z1231 Encounter for screening mammogram for malignant neoplasm of breast: Secondary | ICD-10-CM | POA: Insufficient documentation

## 2010-08-24 DIAGNOSIS — Z139 Encounter for screening, unspecified: Secondary | ICD-10-CM

## 2010-08-26 NOTE — Letter (Signed)
Summary: lab add on  lab add on   Imported By: Luann Bullins 08/19/2010 14:28:05  _____________________________________________________________________  External Attachment:    Type:   Image     Comment:   External Document

## 2010-09-23 LAB — CBC
HCT: 41.3 % (ref 36.0–46.0)
Hemoglobin: 14.1 g/dL (ref 12.0–15.0)
MCH: 29.5 pg (ref 26.0–34.0)
MCHC: 34 g/dL (ref 30.0–36.0)
MCV: 86.5 fL (ref 78.0–100.0)
Platelets: 171 10*3/uL (ref 150–400)
RBC: 4.77 MIL/uL (ref 3.87–5.11)
RDW: 13.1 % (ref 11.5–15.5)
WBC: 5.6 10*3/uL (ref 4.0–10.5)

## 2010-09-23 LAB — COMPREHENSIVE METABOLIC PANEL
ALT: 35 U/L (ref 0–35)
AST: 30 U/L (ref 0–37)
Albumin: 3.7 g/dL (ref 3.5–5.2)
Alkaline Phosphatase: 103 U/L (ref 39–117)
BUN: 16 mg/dL (ref 6–23)
CO2: 28 mEq/L (ref 19–32)
Calcium: 9.2 mg/dL (ref 8.4–10.5)
Chloride: 101 mEq/L (ref 96–112)
Creatinine, Ser: 0.81 mg/dL (ref 0.4–1.2)
GFR calc Af Amer: >60 mL/min (ref >60)
GFR calc non Af Amer: >60 mL/min (ref >60)
Glucose, Bld: 100 mg/dL — ABNORMAL HIGH (ref 70–99)
Potassium: 4 mEq/L (ref 3.5–5.1)
Sodium: 135 mEq/L (ref 135–145)
Total Bilirubin: 0.8 mg/dL (ref 0.3–1.2)
Total Protein: 6.3 g/dL (ref 6.0–8.3)

## 2010-09-23 LAB — SURGICAL PCR SCREEN: MRSA, PCR: NEGATIVE

## 2010-11-05 ENCOUNTER — Encounter: Payer: Self-pay | Admitting: Family Medicine

## 2010-11-10 ENCOUNTER — Encounter: Payer: Self-pay | Admitting: Family Medicine

## 2010-11-10 ENCOUNTER — Other Ambulatory Visit: Payer: Self-pay | Admitting: Family Medicine

## 2010-11-10 LAB — HEMOGLOBIN A1C: Mean Plasma Glucose: 123 mg/dL — ABNORMAL HIGH (ref <117)

## 2010-11-12 ENCOUNTER — Encounter: Payer: Self-pay | Admitting: Family Medicine

## 2010-11-12 ENCOUNTER — Ambulatory Visit (INDEPENDENT_AMBULATORY_CARE_PROVIDER_SITE_OTHER): Payer: 59 | Admitting: Family Medicine

## 2010-11-12 ENCOUNTER — Other Ambulatory Visit: Payer: Self-pay | Admitting: Family Medicine

## 2010-11-12 VITALS — BP 140/86 | HR 103 | Resp 16 | Ht 65.0 in | Wt 177.8 lb

## 2010-11-12 DIAGNOSIS — R946 Abnormal results of thyroid function studies: Secondary | ICD-10-CM

## 2010-11-12 DIAGNOSIS — E041 Nontoxic single thyroid nodule: Secondary | ICD-10-CM

## 2010-11-12 DIAGNOSIS — I10 Essential (primary) hypertension: Secondary | ICD-10-CM

## 2010-11-12 DIAGNOSIS — E663 Overweight: Secondary | ICD-10-CM

## 2010-11-12 DIAGNOSIS — R221 Localized swelling, mass and lump, neck: Secondary | ICD-10-CM

## 2010-11-12 DIAGNOSIS — E785 Hyperlipidemia, unspecified: Secondary | ICD-10-CM

## 2010-11-12 DIAGNOSIS — E039 Hypothyroidism, unspecified: Secondary | ICD-10-CM

## 2010-11-12 DIAGNOSIS — R7301 Impaired fasting glucose: Secondary | ICD-10-CM

## 2010-11-12 MED ORDER — HYDROCHLOROTHIAZIDE 12.5 MG PO CAPS: 12.5000 mg | ORAL_CAPSULE | Freq: Every day | ORAL | Status: DC

## 2010-11-12 MED ORDER — POTASSIUM CHLORIDE 10 MEQ PO TBCR: 10.0000 meq | EXTENDED_RELEASE_TABLET | Freq: Every day | ORAL | Status: DC

## 2010-11-12 NOTE — Assessment & Plan Note (Signed)
Unchanged, will refer to endocrine as well as for an [place] of her thyroid gland to re-evaluate nodule

## 2010-11-12 NOTE — Patient Instructions (Addendum)
F/U in 4 months.  It is important that you exercise regularly at least 30 minutes 5 times a week. If you develop chest pain, have severe difficulty breathing, or feel very tired, stop exercising immediately and seek medical attention    A healthy diet is rich in fruit, vegetables and whole grains. Poultry fish, nuts and beans are a healthy choice for protein rather then red meat. A low sodium diet and drinking 64 ounces of water daily is generally recommended. Oils and sweet should be limited. Carbohydrates especially for those who are diabetic or overweight, should be limited to 34-45 gram per meal. It is important to eat on a regular schedule, at least 3 times daily. Snacks should be primarily fruits, vegetables or nuts.   Fasting chem 7, HBA1C and lipids in 4 months  You are being referred to Dr Fransico Him about your thyroid , and an [place] will be ordered

## 2010-11-12 NOTE — Assessment & Plan Note (Signed)
Deteriorated. Patient re-educated about  the importance of commitment to a  minimum of 150 minutes of exercise per week. The importance of healthy food choices with portion control discussed. Encouraged to start a food diary, count calories and to consider  joining a support group. Sample diet sheets offered. Goals set by the patient for the next several months.goal of 6 to 8 pound weight loss

## 2010-11-12 NOTE — Assessment & Plan Note (Signed)
New diagnosis, pt has her meter which is reliable, at home her blood pressure is normal though in the office on several occasions both systolic and diastolic are elevated, she will start a low dose of hctz with potassium supplement

## 2010-11-12 NOTE — Assessment & Plan Note (Signed)
Improved, pt applauded on this and encouraged to continue a healthier eating plan

## 2010-11-12 NOTE — Progress Notes (Signed)
  Subjective:    Patient ID: Julie May, female    DOB: 03/09/1951, 60 y.o.   MRN: [id number]  HPI Pt in for f/u of chronic conditions. She has unfortunately gained weight , though she reports comiting to  More exercise, she does however state that her eating habits have not improved,. She actually states she feels well, she has concerns over supplementing her thyroid, and requests a f/u US to re-evaluate a nodule which has been seen in the past. She is interested in armour thyroid supplementation, which she can tolerate, has questions about a possible link to uncorrected  thyroid disease and heart disease. She is interested in a second endo opinion.      Review of Systems Denies recent fever or chills. Denies sinus pressure, nasal congestion, ear pain or sore throat. Denies chest congestion, productive cough or wheezing. Denies chest pains, palpitations, paroxysmal nocturnal dyspnea, orthopnea and leg swelling Denies abdominal pain, nausea, vomiting,diarrhea or constipation.  Denies rectal bleeding or change in bowel movement. Denies dysuria, frequency, hesitancy or incontinence. Denies joint pain, swelling and limitation in mobility. Denies headaches, seizure, numbness, or tingling. Denies depression, anxiety or insomnia. Denies skin break down or rash.        Objective:   Physical Exam Patient alert and oriented and in no Cardiopulmonary distress.  HEENT: No facial asymmetry, EOMI, no sinus tenderness, TM's clear, Oropharynx pink and moist.  Neck supple no adenopathy.  Chest: Clear to auscultation bilaterally.  CVS: S1, S2 no murmurs, no S3.  ABD: Soft non tender. Bowel sounds normal.  Ext: No edema  MS: Adequate ROM spine, shoulders, hips and knees.  Skin: Intact, no ulcerations or rash noted.  Psych: Good eye contact, normal affect. Memory intact not anxious or depressed appearing.  CNS: CN 2-12 intact, power, tone and sensation normal throughout.         Assessment & Plan:

## 2010-11-15 ENCOUNTER — Encounter: Payer: Self-pay | Admitting: Family Medicine

## 2010-11-16 ENCOUNTER — Ambulatory Visit (HOSPITAL_COMMUNITY)
Admission: RE | Admit: 2010-11-16 | Discharge: 2010-11-16 | Disposition: A | Discharge status: Home / Self Care | Payer: 59 | Source: Ambulatory Visit | Attending: Family Medicine | Admitting: Family Medicine

## 2010-11-16 DIAGNOSIS — R221 Localized swelling, mass and lump, neck: Secondary | ICD-10-CM

## 2010-11-16 DIAGNOSIS — E049 Nontoxic goiter, unspecified: Secondary | ICD-10-CM | POA: Insufficient documentation

## 2010-11-26 NOTE — Consult Note (Signed)
NAMESOLA, MARGOLIS                   ACCOUNT NO.:  [id number]   MEDICAL RECORD NO.:  [id number]         PATIENT TYPE:  AMB   LOCATION:                                FACILITY:  [facility]   PHYSICIAN:  Lionel December, M.D.    DATE OF BIRTH:  03/24/1951   DATE OF CONSULTATION:  04/01/2005  DATE OF DISCHARGE:                                   CONSULTATION   REFERRING PHYSICIAN:  Ernestina Penna, M.D.   HISTORY OF PRESENT ILLNESS:  The patient is a 60 year old, Caucasian female  patient of Dr. Ralph Dowdy who presents today for further evaluation of recent  change in her bowel movements and left lower quadrant discomfort.  She  states a few weeks ago that she developed constipation.  She did not have a  bowel movement for over week.  She had left lower quadrant abdominal pain.  She saw Dr. Ralph Dowdy who performed gynecological evaluation which was  unremarkable.  She states that the symptoms have resolved.  She has not had  any problems in 2 weeks.  She is interested in colonoscopy, however.  She  does have a history of intermittent small volume hematochezia.   CURRENT MEDICATIONS:  1.  Multivitamin.  2.  Ibuprofen 400 mg daily p.r.n.   ALLERGIES:  No known drug allergies.   PAST MEDICAL HISTORY:  Negative for chronic illnesses.   PAST SURGICAL HISTORY:  Tonsillectomy.   FAMILY HISTORY:  Mother is 12 years old and has history of stroke.  Father  died at age 20 of MI.  No family history of colorectal cancer.   SOCIAL HISTORY:  She is married.  She does not have any children.  She is  unemployed.  Her husband is Quynh Basso who works with [employer].  She has never been a smoker and she occasionally consumes alcohol.   REVIEW OF SYSTEMS:  GASTROINTESTINAL:  See HPI.  CONSTITUTIONAL:  Denies any  weight loss.  CARDIOPULMONARY:  No chest pain or shortness of breath.   PHYSICAL EXAMINATION:  VITAL SIGNS:  Weight 159, height 5 feet 5 inches,  temperature 97.9, blood pressure 122/64, pulse  72.  GENERAL:  Pleasant, well-developed, well-nourished, Caucasian female in no  acute distress.  SKIN:  Warm and dry, no jaundice.  HEENT:  Pupils equal, round and reactive to light.  Conjunctivae are pink.  Sclerae nonicteric.  Oropharyngeal mucosa moist and pink.  No lesions,  erythema or exudate.  No lymphadenopathy or thyromegaly.  CHEST:  Lungs clear to auscultation.  CARDIAC:  Regular rate and rhythm with normal S1, S2, no murmurs, rubs or  gallops.  ABDOMEN:  Positive bowel sounds, soft, nontender, nondistended.  No  organomegaly or masses.  No rebound tenderness or guarding.  No abdominal  bruits or hernias.  EXTREMITIES:  No edema.   IMPRESSION:  Ms. Channing is a 60 year old lady who recently had a change in her  bowel movements.  She had a short period of constipation with left lower  quadrant abdominal pain.  She has intermittent small volume hematochezia.  I  recommend colonoscopy at this time for further evaluation.  I discussed  risks, alternatives and benefits with regards to, but no limited to, the  risk of medication reaction, bleeding, infection, colonic perforation.  The  patient is agreeable to proceed.   RECOMMENDATIONS:  Colonoscopy in the near future.  She will be given prep  per her request.   I would like to thank Dr. Ernestina Penna for allowing [place] to take part in the  care of this patient.      Tana Coast, P.A.      Lionel December, M.D.  Electronically Signed    LL/MEDQ  D:  04/01/2005  T:  04/01/2005  Job:  [id number]   cc:   Ernestina Penna  Fax: [phone]

## 2010-11-26 NOTE — Op Note (Signed)
Julie May, Julie May                   ACCOUNT NO.:  [id number]   MEDICAL RECORD NO.:  [id number]          PATIENT TYPE:  AMB   LOCATION:  DAY                           FACILITY:  [facility]   PHYSICIAN:  Lionel December, M.D.    DATE OF BIRTH:  10-13-1950   DATE OF PROCEDURE:  04/25/2005  DATE OF DISCHARGE:                                 OPERATIVE REPORT   PROCEDURE:  Colonoscopy.   INDICATIONS:  Julie May is a 60 year old Caucasian female who is undergoing  diagnostic colonoscopy.  She recently noted some change in her bowel habits  with constipation and LLQ abdominal pain.  The symptoms have spontaneously  resolved.  She also gives a history of intermittent small-volume  hematochezia felt to be secondary to hemorrhoids.  The procedure risks were  reviewed the patient, informed consent was obtained.  Family history is  negative for colorectal carcinoma.   PREMEDICATION:  Demerol 50 mg IV, Versed 5 mg IV in divided dose.   FINDINGS:  Procedure performed in endoscopy suite.  The patient's vital  signs and O2 saturation were monitored during the procedure and remained  stable.  The patient was placed in the left lateral recumbent position.  Rectal examination performed.  No abnormality noted on external or digital  exam.  The Olympus video scope was placed in the rectum and advanced under  vision into sigmoid colon and beyond.  Preparation was satisfactory.  The  scope was passed into the cecum, which was identified by appendiceal orifice  and ileocecal valve.  Pictures taken for the record.  As the scope was  withdrawn, colonic mucosa was examined for the second time and was normal  throughout.  Rectal mucosa was normal.  The scope was retroflexed to examine  anorectal junction, and small hemorrhoids were noted below the dentate line.  Endoscope was straightened and withdrawn.  The patient tolerated the  procedure well.   FINAL DIAGNOSIS:  Small external hemorrhoids, otherwise normal  colonoscopy.   RECOMMENDATIONS:  She should continue a high-fiber diet.  If she gets  constipated, she can take Colace two tablets at bedtime.  If this  combination does not work, she will give [place] a call.   She should continue yearly Hemoccults and consider next exam in 10 years  from now.      Lionel December, M.D.  Electronically Signed     NR/MEDQ  D:  04/25/2005  T:  04/25/2005  Job:  [id number]   cc:   Ernestina Penna  Fax: [phone]

## 2011-03-15 ENCOUNTER — Ambulatory Visit: Payer: 59 | Admitting: Family Medicine

## 2011-03-19 LAB — BASIC METABOLIC PANEL
CO2: 24 mEq/L (ref 19–32)
Chloride: 104 mEq/L (ref 96–112)
Creat: 1.04 mg/dL (ref 0.40–1.20)

## 2011-03-19 LAB — HEMOGLOBIN A1C: Hgb A1c MFr Bld: 5.7 % — ABNORMAL HIGH (ref <5.7)

## 2011-03-19 LAB — LIPID PANEL
HDL: 63 mg/dL (ref >39)
LDL Cholesterol: 112 mg/dL — ABNORMAL HIGH (ref 0–99)

## 2011-03-22 ENCOUNTER — Encounter: Payer: Self-pay | Admitting: Family Medicine

## 2011-03-23 ENCOUNTER — Encounter: Payer: Self-pay | Admitting: Family Medicine

## 2011-03-23 ENCOUNTER — Ambulatory Visit (INDEPENDENT_AMBULATORY_CARE_PROVIDER_SITE_OTHER): Payer: 59 | Admitting: Family Medicine

## 2011-03-23 VITALS — BP 130/80 | HR 72 | Resp 16 | Ht 65.0 in | Wt 177.1 lb

## 2011-03-23 DIAGNOSIS — Z23 Encounter for immunization: Secondary | ICD-10-CM

## 2011-03-23 DIAGNOSIS — E663 Overweight: Secondary | ICD-10-CM

## 2011-03-23 DIAGNOSIS — Z2911 Encounter for prophylactic immunotherapy for respiratory syncytial virus (RSV): Secondary | ICD-10-CM

## 2011-03-23 DIAGNOSIS — R5383 Other fatigue: Secondary | ICD-10-CM

## 2011-03-23 DIAGNOSIS — E079 Disorder of thyroid, unspecified: Secondary | ICD-10-CM

## 2011-03-23 DIAGNOSIS — E785 Hyperlipidemia, unspecified: Secondary | ICD-10-CM

## 2011-03-23 DIAGNOSIS — I10 Essential (primary) hypertension: Secondary | ICD-10-CM

## 2011-03-23 DIAGNOSIS — R5381 Other malaise: Secondary | ICD-10-CM

## 2011-03-23 DIAGNOSIS — R7301 Impaired fasting glucose: Secondary | ICD-10-CM

## 2011-03-23 DIAGNOSIS — Z13820 Encounter for screening for osteoporosis: Secondary | ICD-10-CM

## 2011-03-23 NOTE — Progress Notes (Signed)
  Subjective:    Patient ID: Julie May, female    DOB: [birth year], 60 y.o.   MRN: [id number]  HPI The PT is here for follow up and re-evaluation of chronic medical conditions, medication management and review of any available recent lab and radiology data.  Preventive health is updated, specifically  Cancer screening and Immunization.   Questions or concerns regarding consultations or procedures which the PT has had in the interim are  Addressed.She is extremely pleased with her new endocrinologist, and is delighted with the fact that she does not need to be on thyroid medication. She will follow with him every 6 months The PT denies any adverse reactions to current medications since the last visit.  There are no new concerns.  There are no specific complaints . Pt has now comited to cycling every day for 1 hour , though no weight has been lost, she feels better and is managing her blood pressure with lifestyle change only. Did not take the meds prescribed due to side effect profile      Review of Systems See HPI Denies recent fever or chills. Denies sinus pressure, nasal congestion, ear pain or sore throat. Denies chest congestion, productive cough or wheezing. Denies chest pains, palpitations and leg swelling Denies abdominal pain, nausea, vomiting,diarrhea or constipation.   Denies dysuria, frequency, hesitancy or incontinence. Denies joint pain, swelling and limitation in mobility. Denies headaches, seizures, numbness, or tingling. Denies depression, anxiety or insomnia. Denies skin break down or rash.        Objective:   Physical Exam Patient alert and oriented and in no cardiopulmonary distress.  HEENT: No facial asymmetry, EOMI, no sinus tenderness,  oropharynx pink and moist.  Neck supple no adenopathy.  Chest: Clear to auscultation bilaterally.  CVS: S1, S2 no murmurs, no S3.  ABD: Soft non tender. Bowel sounds normal.  Ext: No edema  MS: Adequate ROM spine,  shoulders, hips and knees.  Skin: Intact, no ulcerations or rash noted.  Psych: Good eye contact, normal affect. Memory intact not anxious or depressed appearing.  CNS: CN 2-12 intact, power, tone and sensation normal throughout.        Assessment & Plan:

## 2011-03-23 NOTE — Patient Instructions (Signed)
CPE in 6 months  It is important that you exercise regularly at least 30 minutes 5 times a week. If you develop chest pain, have severe difficulty breathing, or feel very tired, stop exercising immediately and seek medical attention   A healthy diet is rich in fruit, vegetables and whole grains. Poultry fish, nuts and beans are a healthy choice for protein rather then red meat. A low sodium diet and drinking 64 ounces of water daily is generally recommended. Oils and sweet should be limited. Carbohydrates especially for those who are diabetic or overweight, should be limited to 30-45 gram per meal. It is important to eat on a regular schedule, at least 3 times daily. Snacks should be primarily fruits, vegetables or nuts.   Isuggest you start using the free app for calorie counting  Zostavax today  Return for flu vaccine , pls make appt ..   Cbc, fasting lipid, HBA1C and chem 7 in 6 months just before appt.  Bone density test will be scheduled

## 2011-03-23 NOTE — Assessment & Plan Note (Signed)
low fat diet discussed and encouraged

## 2011-03-23 NOTE — Assessment & Plan Note (Signed)
Unchanged. Patient re-educated about  the importance of commitment to a  minimum of 150 minutes of exercise per week. The importance of healthy food choices with portion control discussed. Encouraged to start a food diary, count calories and to consider  joining a support group. Sample diet sheets offered. Goals set by the patient for the next several months.    

## 2011-03-23 NOTE — Assessment & Plan Note (Signed)
Normotensive off medication, managed with lifestyle change only

## 2011-03-23 NOTE — Assessment & Plan Note (Signed)
Followed by endocrine, no medication needed

## 2011-03-29 ENCOUNTER — Telehealth: Payer: Self-pay | Admitting: Family Medicine

## 2011-03-29 NOTE — Telephone Encounter (Signed)
Result mailed

## 2011-04-05 ENCOUNTER — Telehealth: Payer: Self-pay | Admitting: Family Medicine

## 2011-04-05 NOTE — Telephone Encounter (Signed)
Mailed last lab work to her

## 2011-06-21 ENCOUNTER — Ambulatory Visit: Payer: 59

## 2011-06-22 ENCOUNTER — Ambulatory Visit (INDEPENDENT_AMBULATORY_CARE_PROVIDER_SITE_OTHER): Payer: 59

## 2011-06-22 DIAGNOSIS — Z23 Encounter for immunization: Secondary | ICD-10-CM

## 2011-07-06 ENCOUNTER — Emergency Department
Admission: EM | Admit: 2011-07-06 | Discharge: 2011-07-06 | Disposition: A | Discharge status: Home / Self Care | Payer: 59 | Source: Home / Self Care | Attending: Family Medicine | Admitting: Family Medicine

## 2011-07-06 ENCOUNTER — Encounter: Payer: Self-pay | Admitting: Emergency Medicine

## 2011-07-06 DIAGNOSIS — H6120 Impacted cerumen, unspecified ear: Secondary | ICD-10-CM

## 2011-07-06 DIAGNOSIS — H6692 Otitis media, unspecified, left ear: Secondary | ICD-10-CM

## 2011-07-06 DIAGNOSIS — J029 Acute pharyngitis, unspecified: Secondary | ICD-10-CM

## 2011-07-06 DIAGNOSIS — H6690 Otitis media, unspecified, unspecified ear: Secondary | ICD-10-CM

## 2011-07-06 MED ORDER — AMOXICILLIN 875 MG PO TABS: 875.0000 mg | ORAL_TABLET | Freq: Two times a day (BID) | ORAL | Status: AC

## 2011-07-06 MED ORDER — BENZONATATE 200 MG PO CAPS: 200.0000 mg | ORAL_CAPSULE | Freq: Every day | ORAL | Status: AC

## 2011-07-06 NOTE — ED Provider Notes (Signed)
History     CSN: [id number]  Arrival date & time 07/06/11  7829   First MD Initiated Contact with Patient 07/06/11 1906      Chief Complaint  Patient presents with  . Sore Throat      HPI Comments: Patient complains of approximately11 day history of persistent sore throat.  Complains of fatigue but no myalgias.   A cough developed about one week ago, now worse at night and generally non-productive during the day.  There has been no pleuritic pain, shortness of breath, or wheezes.  She has developed persistent post nasal drainage.  No fevers, chills, and sweats   Patient is a 60 y.o. female presenting with pharyngitis. The history is provided by the patient.  Sore Throat This is a new problem. Episode onset: 11 days ago. The problem occurs constantly. The problem has been gradually worsening. Pertinent negatives include no chest pain. The symptoms are aggravated by swallowing. The symptoms are relieved by nothing.    Past Medical History  Diagnosis Date  . Overweight   . Hyperpotassemia   . Other abnormal glucose   . Polycythemia, secondary   . Routine general medical examination at a health care facility   . Trigger finger (acquired)   . Unspecified disorder of thyroid   . OA (osteoarthritis)   . Hyperlipidemia   . Hypertension     new dx 11/12/2010    Past Surgical History  Procedure Date  . Tonsilectomy, adenoidectomy, bilateral myringotomy and tubes   . Tonsillectomy     Family History  Problem Relation Age of Onset  . Heart attack Father   . Stroke Mother   . Thyroid disease Sister   . Hyperlipidemia Brother   . Hyperlipidemia Brother   . Hyperlipidemia Brother   . Arthritis      family history   . Hypertension      family history   . Stroke      family history   . Heart disease      family history     History  Substance Use Topics  . Smoking status: Never Smoker   . Smokeless tobacco: Not on file  . Alcohol Use: Yes    OB History    Grav Para  Term Preterm Abortions TAB SAB Ect Mult Living                  Review of Systems  Cardiovascular: Negative for chest pain.   + sore throat + cough, worse at night No pleuritic pain No wheezing Mild nasal congestion + post-nasal drainage No sinus pain/pressure No itchy/red eyes ? right earache No hemoptysis No SOB No fever/chills No nausea No vomiting No abdominal pain No diarrhea No urinary symptoms No skin rashes + fatigue No myalgias No headache Used OTC meds without relief  Allergies  Review of patient's allergies indicates no known allergies.  Home Medications   Current Outpatient Rx  Name Route Sig Dispense Refill  . OMEGA-3 FATTY ACIDS 1000 MG PO CAPS Oral Take by mouth daily.      . AMOXICILLIN 875 MG PO TABS Oral Take 1 tablet (875 mg total) by mouth 2 (two) times daily. 20 tablet 0  . BENZONATATE 200 MG PO CAPS Oral Take 1 capsule (200 mg total) by mouth at bedtime. Take as needed for cough 12 capsule 0  . ONE-DAILY MULTI VITAMINS PO TABS Oral Take 1 tablet by mouth daily.        Pulse 79  Temp(Src) 98.8 F (37.1 C) (Oral)  Resp 16  Ht 5\' 5"  (1.651 m)  Wt 175 lb (79.379 kg)  BMI 29.12 kg/m2  SpO2 97%  Physical Exam Nursing notes and Vital Signs reviewed. Appearance:  Patient appears healthy, stated age, and in no acute distress.  Patient is overweight (BMI 29.2)   Eyes:  Pupils are equal, round, and reactive to light and accomodation.  Extraocular movement is intact.  Conjunctivae are not inflamed  Ears:  Left canal normal.  Left tympanic membrane normal.  Right canal occluded with cerumen.  Post lavage, left tympanic membrane is erythematous.  Left canal slightly erythematous but nontender to speculum insertion. Nose:  Mildly congested turbinates.  No sinus tenderness.   Pharynx:  Normal Neck:  Supple.  No adenopathy.  No thyromegaly. Lungs:  Clear to auscultation.  Breath sounds are equal.  Heart:  Regular rate and rhythm without murmurs, rubs,  or gallops.  Abdomen:  Nontender without masses or hepatosplenomegaly.  Bowel sounds are present.  No CVA or flank tenderness.  Skin:  No rash present.   ED Course  Procedures right ear lavage   Labs Reviewed  POCT RAPID STREP A (OFFICE) negative      1. Acute pharyngitis   2. Cerumen impaction   3. Left otitis media       MDM  Begin amoxicillin for 10 days, and cough suppressant at bedtime. Take Mucinex D (guaifenesin with decongestant) twice daily for congestion, or Mucinex plus Sudafed.   Increase fluid intake, rest. May use Afrin nasal spray (or generic oxymetazoline) twice daily for about 5 days.  Also recommend using saline nasal spray several times daily and/or saline nasal irrigation. Stop all antihistamines for now, and other non-prescription cough/cold preparations. Follow-up with ENT physician if not improving 7 to 10 days.         Donna Christen, MD 07/11/11 [phone]

## 2011-07-06 NOTE — ED Notes (Signed)
Sore throat, cough, congestion, hoarseness x 11 days. Had Flu vaccination this season.

## 2011-08-26 ENCOUNTER — Other Ambulatory Visit: Payer: Self-pay | Admitting: Family Medicine

## 2011-08-26 DIAGNOSIS — Z139 Encounter for screening, unspecified: Secondary | ICD-10-CM

## 2011-08-30 ENCOUNTER — Ambulatory Visit (HOSPITAL_COMMUNITY)
Admission: RE | Admit: 2011-08-30 | Discharge: 2011-08-30 | Disposition: A | Discharge status: Home / Self Care | Payer: 59 | Source: Ambulatory Visit | Attending: Family Medicine | Admitting: Family Medicine

## 2011-08-30 DIAGNOSIS — Z139 Encounter for screening, unspecified: Secondary | ICD-10-CM

## 2011-08-30 DIAGNOSIS — Z1231 Encounter for screening mammogram for malignant neoplasm of breast: Secondary | ICD-10-CM | POA: Insufficient documentation

## 2011-09-20 ENCOUNTER — Encounter: Payer: 59 | Admitting: Family Medicine

## 2011-11-02 ENCOUNTER — Encounter: Payer: 59 | Admitting: Family Medicine

## 2011-12-02 ENCOUNTER — Other Ambulatory Visit: Payer: Self-pay | Admitting: Family Medicine

## 2011-12-03 LAB — CBC WITH DIFFERENTIAL/PLATELET
Basophils Absolute: 0 10*3/uL (ref 0.0–0.1)
Basophils Relative: 0 % (ref 0–1)
Eosinophils Absolute: 0.2 10*3/uL (ref 0.0–0.7)
Eosinophils Relative: 3 % (ref 0–5)
MCH: 28.3 pg (ref 26.0–34.0)
MCV: 84.5 fL (ref 78.0–100.0)
Platelets: 244 10*3/uL (ref 150–400)
RDW: 14 % (ref 11.5–15.5)
WBC: 5.9 10*3/uL (ref 4.0–10.5)

## 2011-12-03 LAB — HEMOGLOBIN A1C
Hgb A1c MFr Bld: 6.2 % — ABNORMAL HIGH (ref <5.7)
Mean Plasma Glucose: 131 mg/dL — ABNORMAL HIGH (ref <117)

## 2011-12-07 ENCOUNTER — Ambulatory Visit (INDEPENDENT_AMBULATORY_CARE_PROVIDER_SITE_OTHER): Payer: 59 | Admitting: Family Medicine

## 2011-12-07 ENCOUNTER — Other Ambulatory Visit (HOSPITAL_COMMUNITY)
Admission: RE | Admit: 2011-12-07 | Discharge: 2011-12-07 | Disposition: A | Discharge status: Home / Self Care | Payer: 59 | Source: Ambulatory Visit | Attending: Family Medicine | Admitting: Family Medicine

## 2011-12-07 ENCOUNTER — Ambulatory Visit (HOSPITAL_COMMUNITY)
Admission: RE | Admit: 2011-12-07 | Discharge: 2011-12-07 | Disposition: A | Discharge status: Home / Self Care | Payer: 59 | Source: Ambulatory Visit | Attending: Family Medicine | Admitting: Family Medicine

## 2011-12-07 ENCOUNTER — Encounter: Payer: Self-pay | Admitting: Family Medicine

## 2011-12-07 VITALS — BP 120/82 | HR 68 | Resp 16 | Ht 65.0 in | Wt 176.1 lb

## 2011-12-07 DIAGNOSIS — R7303 Prediabetes: Secondary | ICD-10-CM

## 2011-12-07 DIAGNOSIS — R7302 Impaired glucose tolerance (oral): Secondary | ICD-10-CM | POA: Insufficient documentation

## 2011-12-07 DIAGNOSIS — Z124 Encounter for screening for malignant neoplasm of cervix: Secondary | ICD-10-CM

## 2011-12-07 DIAGNOSIS — Z13820 Encounter for screening for osteoporosis: Secondary | ICD-10-CM

## 2011-12-07 DIAGNOSIS — M899 Disorder of bone, unspecified: Secondary | ICD-10-CM | POA: Insufficient documentation

## 2011-12-07 DIAGNOSIS — R7309 Other abnormal glucose: Secondary | ICD-10-CM

## 2011-12-07 DIAGNOSIS — Z01419 Encounter for gynecological examination (general) (routine) without abnormal findings: Secondary | ICD-10-CM | POA: Insufficient documentation

## 2011-12-07 DIAGNOSIS — E785 Hyperlipidemia, unspecified: Secondary | ICD-10-CM

## 2011-12-07 DIAGNOSIS — E663 Overweight: Secondary | ICD-10-CM

## 2011-12-07 DIAGNOSIS — Z0000 Encounter for general adult medical examination without abnormal findings: Secondary | ICD-10-CM

## 2011-12-07 DIAGNOSIS — Z1211 Encounter for screening for malignant neoplasm of colon: Secondary | ICD-10-CM

## 2011-12-07 DIAGNOSIS — E039 Hypothyroidism, unspecified: Secondary | ICD-10-CM

## 2011-12-07 NOTE — Patient Instructions (Addendum)
F/u in 3 month  Your blood sugar average is higher, you need to attend a group at the hospital for diabetic education, espescialy focusing on how to eat.please call for an appointment.  We will also provide you with basic dietary information to help improve your blood sugars, and assist with weight loss.  A healthy diet is rich in fruit, vegetables and whole grains. Poultry fish, nuts and beans are a healthy choice for protein rather then red meat. A low sodium diet and drinking 64 ounces of water daily is generally recommended. Oils and sweet should be limited. Carbohydrates especially for those who are diabetic or overweight, should be limited to 30-45 gram per meal. It is important to eat on a regular schedule, at least 3 times daily. Snacks should be primarily fruits, vegetables or nuts.  It is important that you exercise regularly at least 45 minutes 5 times a week. If you develop chest pain, have severe difficulty breathing, or feel very tired, stop exercising immediately and seek medical attention .  Weight loss goal of 3 to 4 pounds per month  HBA1C in 3 month

## 2011-12-07 NOTE — Progress Notes (Signed)
  Subjective:    Patient ID: Julie May, female    DOB: September 07, 1950, 61 y.o.   MRN: [id number]  HPI The PT is here for annual exam and re-evaluation of chronic medical conditions, medication management and review of any available recent lab and radiology data.  Preventive health is updated, specifically  Cancer screening and Immunization.   Questions or concerns regarding consultations or procedures which the PT has had in the interim are  Addressed.Recently started on thyroid replacement The PT denies any adverse reactions to current medications since the last visit.  There are no new concerns.  There are no specific complaints       Review of Systems See HPI Denies recent fever or chills. Denies sinus pressure, nasal congestion, ear pain or sore throat. Denies chest congestion, productive cough or wheezing. Denies chest pains, palpitations and leg swelling Denies abdominal pain, nausea, vomiting,diarrhea or constipation.   Denies dysuria, frequency, hesitancy or incontinence. Denies joint pain, swelling and limitation in mobility. Denies headaches, seizures, numbness, or tingling. Denies depression, anxiety or insomnia. Denies skin break down or rash.        Objective:   Physical Exam .Pleasant well nourished female, alert and oriented x 3, in no cardio-pulmonary distress. Afebrile. HEENT No facial trauma or asymetry. Sinuses non tender.  EOMI, PERTL  External ears normal, tympanic membranes clear. Oropharynx moist, no exudate, good dentition. Neck: supple, no adenopathy,JVD or thyromegaly.No bruits.  Chest: Clear to ascultation bilaterally.No crackles or wheezes. Non tender to palpation  Breast: No asymetry,no masses. No nipple discharge or inversion. No axillary or supraclavicular adenopathy  Cardiovascular system; Heart sounds normal,  S1 and  S2 ,no S3.  No murmur, or thrill. Apical beat not displaced Peripheral pulses normal.  Abdomen: Soft, non tender,  no organomegaly or masses. No bruits. Bowel sounds normal. No guarding, tenderness or rebound.  Rectal:  No mass. Guaiac negative stool.  GU: External genitalia normal. No lesions. Vaginal canal normal.No discharge. Uterus normal size, no adnexal masses, no cervical motion or adnexal tenderness.  Musculoskeletal exam: Full ROM of spine, hips , shoulders and knees. No deformity ,swelling or crepitus noted. No muscle wasting or atrophy.   Neurologic: Cranial nerves 2 to 12 intact. Power, tone ,sensation and reflexes normal throughout. No disturbance in gait. No tremor.  Skin: Intact, no ulceration, erythema , scaling or rash noted. Pigmentation normal throughout  Psych; Normal mood and affect. Judgement and concentration normal        Assessment & Plan:

## 2011-12-08 NOTE — Assessment & Plan Note (Signed)
Improved with dietary change only, continue same

## 2011-12-08 NOTE — Assessment & Plan Note (Signed)
Unchanged. Patient re-educated about  the importance of commitment to a  minimum of 150 minutes of exercise per week. The importance of healthy food choices with portion control discussed. Encouraged to start a food diary, count calories and to consider  joining a support group. Sample diet sheets offered. Goals set by the patient for the next several months.    

## 2011-12-08 NOTE — Assessment & Plan Note (Signed)
Obesity and prediabetic status addressed as this has deteriorated   Commitment to dietary change and weight loss is essential.  Pt has no issues with sleep or safety in the home. Supplementation with calcium , which had discontinued is encouraged, also daily low dose aspirin for stroke risk reduction

## 2011-12-08 NOTE — Assessment & Plan Note (Signed)
Deteriorated, educated in office again, also advised to attend class, rept HBa1C in 3 month

## 2011-12-08 NOTE — Assessment & Plan Note (Signed)
Followed by endo, has now started replacement therapy which for the past 3 weeks has not caused a problem

## 2011-12-13 ENCOUNTER — Encounter (HOSPITAL_COMMUNITY): Payer: Self-pay | Admitting: Dietician

## 2011-12-13 NOTE — Progress Notes (Signed)
[facility] Diabetes Class Completion  Date:December 13, 2011  Time: 10:00 AM  Pt attended [facility]'s Diabetes Class on December 13, 2011.   Patient was educated on the following topics: carbohydrate metabolism in relation to diabetes, sources of carbohydrate, carbohydrate counting, meal planning strategies, food label reading, and portion control.   Melody Haver, RD, LDN Date:December 13, 2011 Time: 10:00 AM

## 2012-01-02 ENCOUNTER — Encounter: Payer: Self-pay | Admitting: Family Medicine

## 2012-03-07 ENCOUNTER — Ambulatory Visit: Payer: 59 | Admitting: Family Medicine

## 2012-03-22 ENCOUNTER — Encounter: Payer: Self-pay | Admitting: Family Medicine

## 2012-03-22 ENCOUNTER — Ambulatory Visit (INDEPENDENT_AMBULATORY_CARE_PROVIDER_SITE_OTHER): Payer: 59 | Admitting: Family Medicine

## 2012-03-22 VITALS — BP 112/80 | HR 70 | Temp 98.5°F | Resp 15 | Ht 65.0 in | Wt 169.0 lb

## 2012-03-22 DIAGNOSIS — E039 Hypothyroidism, unspecified: Secondary | ICD-10-CM

## 2012-03-22 DIAGNOSIS — E663 Overweight: Secondary | ICD-10-CM

## 2012-03-22 DIAGNOSIS — J322 Chronic ethmoidal sinusitis: Secondary | ICD-10-CM

## 2012-03-22 DIAGNOSIS — R7303 Prediabetes: Secondary | ICD-10-CM

## 2012-03-22 DIAGNOSIS — E785 Hyperlipidemia, unspecified: Secondary | ICD-10-CM

## 2012-03-22 DIAGNOSIS — R7309 Other abnormal glucose: Secondary | ICD-10-CM

## 2012-03-22 LAB — HEMOGLOBIN A1C
Hgb A1c MFr Bld: 5.9 % — ABNORMAL HIGH (ref <5.7)
Mean Plasma Glucose: 123 mg/dL — ABNORMAL HIGH (ref <117)

## 2012-03-22 MED ORDER — SULFAMETHOXAZOLE-TRIMETHOPRIM 800-160 MG PO TABS: 1.0000 | ORAL_TABLET | Freq: Two times a day (BID) | ORAL | Status: AC

## 2012-03-22 MED ORDER — FLUCONAZOLE 150 MG PO TABS: ORAL_TABLET | ORAL | Status: AC

## 2012-03-22 NOTE — Patient Instructions (Addendum)
F/u in 4.5 month, pls cancel sooner appt.  Please call if you need me before  Please call and come for your flu vaccine when better  Congrats on weight losss, keep it up!  You are being treated for left ethmoid sinusitis with 10 days of antibiotics. Do saline nasal flushes 2 to 3 times daily also please.  If you still have symptoms on near completion of the course of treatment , call I will extend your treatment, if it continues after that you will need a scan of your sinuses, and possibly an ENT eval  Please take one calcium with D gel capsule 1200mg /1000IU one daily, you have thinning of the bones. You will get information on bone health  HBa1C today, and in 4.5 months before next visit   Hba1C in 4.5 month

## 2012-03-26 NOTE — Assessment & Plan Note (Signed)
Antibiotic course prscribed

## 2012-03-26 NOTE — Assessment & Plan Note (Signed)
Improved HBa1C with lifestyle change and weight loss

## 2012-03-26 NOTE — Assessment & Plan Note (Signed)
Improved. Pt applauded on succesful weight loss through lifestyle change, and encouraged to continue same. Weight loss goal set for the next several months.  

## 2012-03-26 NOTE — Progress Notes (Signed)
  Subjective:    Patient ID: Julie May, female    DOB: 08-Oct-1950, 61 y.o.   MRN: [id number]  HPI 2 week h/o sinus pressure over ethmoidal sinus with yellow nasal drainage which is foul tasting and smelly. Intermittent chills, no documented fever, sore throat, ear pain or productive cough. Has worked aggressively on lifestyle change with successful weight loss   Review of Systems See HPI Denies recent fever or chills.  Denies chest congestion, productive cough or wheezing. Denies chest pains, palpitations and leg swelling Denies abdominal pain, nausea, vomiting,diarrhea or constipation.   Denies dysuria, frequency, hesitancy or incontinence. Denies joint pain, swelling and limitation in mobility. Denies headaches, seizures, numbness, or tingling. Denies depression, anxiety or insomnia. Denies skin break down or rash.        Objective:   Physical Exam Patient alert and oriented and in no cardiopulmonary distress.  HEENT: No facial asymmetry, EOMI,ethmoid  sinus tenderness,  oropharynx pink and moist.  Neck supple no adenopathy.  Chest: Clear to auscultation bilaterally.  CVS: S1, S2 no murmurs, no S3.  ABD: Soft non tender. Bowel sounds normal.  Ext: No edema  MS: Adequate ROM spine, shoulders, hips and knees.  Skin: Intact, no ulcerations or rash noted.  Psych: Good eye contact, normal affect. Memory intact not anxious or depressed appearing.  CNS: CN 2-12 intact, power, tone and sensation normal throughout.        Assessment & Plan:

## 2012-03-26 NOTE — Assessment & Plan Note (Signed)
Improved with low fat diet , continue same

## 2012-03-26 NOTE — Assessment & Plan Note (Signed)
followed by endo.  

## 2012-03-28 ENCOUNTER — Ambulatory Visit: Payer: 59 | Admitting: Family Medicine

## 2012-03-30 ENCOUNTER — Telehealth: Payer: Self-pay | Admitting: Family Medicine

## 2012-03-30 ENCOUNTER — Other Ambulatory Visit: Payer: Self-pay | Admitting: Family Medicine

## 2012-03-30 DIAGNOSIS — J329 Chronic sinusitis, unspecified: Secondary | ICD-10-CM

## 2012-03-30 NOTE — Telephone Encounter (Signed)
Patient was calling back to let Dr. Lodema Hong know that she has not seen any improvement from the use of antibiotics and that is slightly upsetting her stomach.

## 2012-03-30 NOTE — Telephone Encounter (Signed)
pls advise I recommend a ct scan of her sinuses, I am putting in the referral, she can stop the current antibiotic, and I am also referring for ENT eval

## 2012-04-04 ENCOUNTER — Other Ambulatory Visit (HOSPITAL_COMMUNITY): Payer: 59

## 2012-04-05 NOTE — Telephone Encounter (Signed)
Called patient and left message for them to return call at the office   

## 2012-04-06 NOTE — Telephone Encounter (Signed)
Spoke with prt states she feels better, does not want the CT scan at this  time

## 2012-08-21 ENCOUNTER — Ambulatory Visit (INDEPENDENT_AMBULATORY_CARE_PROVIDER_SITE_OTHER): Payer: 59 | Admitting: Family Medicine

## 2012-08-21 ENCOUNTER — Encounter: Payer: Self-pay | Admitting: Family Medicine

## 2012-08-21 VITALS — BP 106/72 | HR 76 | Resp 18 | Wt 179.1 lb

## 2012-08-21 DIAGNOSIS — L729 Follicular cyst of the skin and subcutaneous tissue, unspecified: Secondary | ICD-10-CM | POA: Insufficient documentation

## 2012-08-21 DIAGNOSIS — L723 Sebaceous cyst: Secondary | ICD-10-CM

## 2012-08-21 NOTE — Patient Instructions (Signed)
Continue to watch the cyst on he back of head Call if if this enlarges or does not go away completely   Keep follow-up with Dr. Lodema Hong

## 2012-08-21 NOTE — Progress Notes (Signed)
  Subjective:    Patient ID: Julie May, female    DOB: 1950/10/01, 62 y.o.   MRN: [id number]  HPI  Pt presents with knot on back of her head for the past 3 days, no injury, no fever, no previous lesions, mild tenderness relieved with tylenol. Smaller than it was on Sunday  Review of Systems - per above  GEN- denies fatigue, fever, weight loss,weakness, recent illness HEENT- denies eye drainage, change in vision, nasal discharge, CVS- denies chest pain, palpitations RESP- denies SOB, cough, wheeze MSK- denies joint pain, muscle aches, injury Neuro- denies headache, dizziness, syncope, seizure activity      Objective:   Physical Exam   GEN- NAD, alert and oriented x3 HEENT- PERRL, EOMI, non injected sclera, pink conjunctiva, MMM, oropharynx clear Neck- Supple, no LAD, no posterior nodes CVS- RRR, no murmur RESP-CTAB Pulses- Radial 2+ Skin- Right occipital region 1cm cystic lesions in scalp, mild TTP, no erythema, no fluctuance, no induration, no warmth,           Assessment & Plan:

## 2012-08-21 NOTE — Assessment & Plan Note (Signed)
Appears to be a dermoid cyst or possible small lipoma, no evidence of infection. It is is already resolving She will call if there are any changes, would then image

## 2012-10-19 ENCOUNTER — Other Ambulatory Visit: Payer: Self-pay | Admitting: Family Medicine

## 2012-10-19 DIAGNOSIS — Z139 Encounter for screening, unspecified: Secondary | ICD-10-CM

## 2012-10-23 ENCOUNTER — Ambulatory Visit (HOSPITAL_COMMUNITY)
Admission: RE | Admit: 2012-10-23 | Discharge: 2012-10-23 | Disposition: A | Discharge status: Home / Self Care | Payer: 59 | Source: Ambulatory Visit | Attending: Family Medicine | Admitting: Family Medicine

## 2012-10-23 DIAGNOSIS — Z139 Encounter for screening, unspecified: Secondary | ICD-10-CM

## 2012-10-23 DIAGNOSIS — Z1231 Encounter for screening mammogram for malignant neoplasm of breast: Secondary | ICD-10-CM | POA: Insufficient documentation

## 2013-04-10 ENCOUNTER — Ambulatory Visit (INDEPENDENT_AMBULATORY_CARE_PROVIDER_SITE_OTHER): Payer: 59

## 2013-04-10 ENCOUNTER — Ambulatory Visit (INDEPENDENT_AMBULATORY_CARE_PROVIDER_SITE_OTHER): Payer: 59 | Admitting: Orthopedic Surgery

## 2013-04-10 VITALS — Ht 65.0 in | Wt 173.0 lb

## 2013-04-10 DIAGNOSIS — M25562 Pain in left knee: Secondary | ICD-10-CM

## 2013-04-10 DIAGNOSIS — M25569 Pain in unspecified knee: Secondary | ICD-10-CM

## 2013-04-10 NOTE — Progress Notes (Signed)
Patient ID: Julie May, female   DOB: 1950-10-17, 62 y.o.   MRN: [id number]  Chief Complaint  Patient presents with  . Knee Pain    Left knee pain and popping , no injuury,    HISTORY: 2 week history of left knee pain and swelling   She denies any trauma, but does complain of 4/10 dull pain with intermittent symptoms. She does complain that when she bends her knee her knee pops she feels grinding and has joint swelling. Her symptoms are worse with activity slightly better with Aleve and and with slight flexion of the knee.  Review of Systems  Musculoskeletal: Positive for joint pain.  All other systems reviewed and are negative.   The past, family history and social history have been reviewed and are recorded in the corresponding sections of epic   Ht 5\' 5"  (1.651 m)  Wt 173 lb (78.472 kg)  BMI 28.79 kg/m2 General appearance is normal, the patient is alert and oriented x3 with normal mood and affect.  She seems to be walking well and does not use assistive devices. Her left knee looks to be in valgus both legs are equal in terms of length. She has some crepitance and grinding in the right knee full range of motion without pain is stable motor exam normal skin is intact  Left knee no joint line tenderness she does have joint effusion her flexion is limited to 100 measured with goniometer, motor exam normal skin intact  Distal sensation normal bilaterally with intact and equal dorsalis pedis pulses. She does have bilateral cavus feet with callus formation and typical small joint deformities.  Medical problems hypothyroidism previous surgery tonsillectomy  X-rays show an interesting valgus knee with medial compartment disease and severe patellofemoral disease  Diagnosis osteoarthritis left knee with effusion  Plan aspiration injection left knee followup 6 months for x-rays  Patient information regarding knee replacement and osteoarthritis  Knee  Injection Procedure  Note  Pre-operative Diagnosis: left knee oa  Post-operative Diagnosis: same  Indications: pain  Anesthesia: ethyl chloride   Procedure Details   Verbal consent was obtained for the procedure. Time out was completed.The joint was prepped with alcohol, followed by  Ethyl chloride spray and A 20 gauge needle was inserted into the knee via lateral approach; 4ml 1% lidocaine and 1 ml of depomedrol  was then injected into the joint . The needle was removed and the area cleansed and dressed.  Complications:  None; patient tolerated the procedure well.

## 2013-04-10 NOTE — Patient Instructions (Addendum)
You have received a steroid shot. 15% of patients experience increased pain at the injection site with in the next 24 hours. This is best treated with ice and tylenol extra strength 2 tabs every 8 hours. If you are still having pain please call the office.    Wear and Tear Disorders of the Knee (Arthritis, Osteoarthritis) Everyone will experience wear and tear injuries (arthritis, osteoarthritis) of the knee. These are the changes we all get as we age. They come from the joint stress of daily living. The amount of cartilage damage in your knee and your symptoms determine if you need surgery. Mild problems require approximately two months recovery time. More severe problems take several months to recover. With mild problems, your surgeon may find worn and rough cartilage surfaces. With severe changes, your surgeon may find cartilage that has completely worn away and exposed the bone. Loose bodies of bone and cartilage, bone spurs (excess bone growth), and injuries to the menisci (cushions between the large bones of your leg) are also common. All of these problems can cause pain. For a mild wear and tear problem, rough cartilage may simply need to be shaved and smoothed. For more severe problems with areas of exposed bone, your surgeon may use an instrument for roughing up the bone surfaces to stimulate new cartilage growth. Loose bodies are usually removed. Torn menisci may be trimmed or repaired. ABOUT THE ARTHROSCOPIC PROCEDURE Arthroscopy is a surgical technique. It allows your orthopedic surgeon to diagnose and treat your knee injury with accuracy. The surgeon looks into your knee through a small scope. The scope is like a small (pencil-sized) telescope. Arthroscopy is less invasive than open knee surgery. You can expect a more rapid recovery. After the procedure, you will be moved to a recovery area until most of the effects of the medication have worn off. Your caregiver will discuss the test results  with you. RECOVERY The severity of the arthritis and the type of procedure performed will determine recovery time. Other important factors include age, physical condition, medical conditions, and the type of rehabilitation program. Strengthening your muscles after arthroscopy helps guarantee a better recovery. Follow your caregiver's instructions. Use crutches, rest, elevate, ice, and do knee exercises as instructed. Your caregivers will help you and instruct you with exercises and other physical therapy required to regain your mobility, muscle strength, and functioning following surgery. Only take over-the-counter or prescription medicines for pain, discomfort, or fever as directed by your caregiver.  SEEK MEDICAL CARE IF:   There is increased bleeding (more than a small spot) from the wound.  You notice redness, swelling, or increasing pain in the wound.  Pus is coming from wound.  You develop an unexplained oral temperature above 102 F (38.9 C) , or as your caregiver suggests.  You notice a foul smell coming from the wound or dressing.  You have severe pain with motion of the knee. SEEK IMMEDIATE MEDICAL CARE IF:   You develop a rash.  You have difficulty breathing.  You have any allergic problems. MAKE SURE YOU:   Understand these instructions.  Will watch your condition.  Will get help right away if you are not doing well or get worse. Document Released: 06/24/2000 Document Revised: 09/19/2011 Document Reviewed: 11/21/2007 [facility] Patient Information 2014 [place], [place]. Total Knee Replacement Total knee replacement is a procedure to replace your knee joint with an artificial knee joint (prosthetic knee joint). The purpose of this surgery is to reduce pain and improve your  knee function. LET YOUR CAREGIVER KNOW ABOUT:   Any allergies you have.  Any medicines you are taking, including vitamins, herbs, eyedrops, over-the-counter medicines, and creams.  Any problems you  have had with the use of anesthetics.  Family history of problems with the use of anesthetics.  Any blood disorders you have, including bleeding problems or clotting problems.  Previous surgeries you have had. RISKS AND COMPLICATIONS  Generally, total knee replacement is a safe procedure. However, as with any surgical procedure, complications can occur. Possible complications associated with total knee replacement include:  Loss of range of motion of the knee or instability.  Loosening of the prosthesis.  Infection.  Persistent pain. BEFORE THE PROCEDURE   Your caregiver will instruct you when you need to stop eating and drinking.  Ask your caregiver if you need to change or stop any regular medicines. PROCEDURE  Just before the procedure you will receive medicine that will make you drowsy (sedative). This will be given through a tube that is inserted into one of your veins (intravenous [IV] tube). Then you will either receive medicine to block pain from the waist down through your legs (spinal block) or medicine to also receive medicine to make you fall asleep (general anesthetic). You may also receive medicine to block feeling in your leg (nerve block) to help ease pain after surgery. An incision will be made in your knee. Your surgeon will take out any damaged cartilage and bone by sawing off the damaged surfaces. Then the surgeon will put a new metal liner over the sawed off portion of your thigh bone (femur) and a plastic liner over the sawed off portion of one of the bones of your lower leg (tibia). This is to restore alignment and function to your knee. A plastic piece is often used to restore the surface of your knee cap. AFTER THE PROCEDURE  You will be taken to the recovery area. You may have drainage tubes to drain excess fluid from your knee. These tubes attach to a device that removes these fluids. Once you are awake, stable, and taking fluids well, you will be taken to your  hospital room. You will receive physical therapy as prescribed by your caregiver. The length of your stay in the hospital after a knee replacement is 2 4 days. Your surgeon may recommend that you spend time (usually an additional 10 14 days) in an extended-care facility to help you begin walking again and improve your range of motion before you go home. You may also be prescribed blood-thinning medicine to decrease your risk of developing blood clots in your leg. Document Released: 10/03/2000 Document Revised: 12/27/2011 Document Reviewed: 08/07/2011 [facility] Patient Information 2014 [place], [place].

## 2013-04-14 ENCOUNTER — Encounter: Payer: Self-pay | Admitting: Orthopedic Surgery

## 2013-04-29 ENCOUNTER — Telehealth: Payer: Self-pay | Admitting: Orthopedic Surgery

## 2013-04-29 NOTE — Telephone Encounter (Signed)
Spoke with patient and she would like to be evaluated by PT for knee strengthening exercises, in hopes that she can put off knee replacement for as Julie May as possible. May I order this?

## 2013-04-30 ENCOUNTER — Other Ambulatory Visit: Payer: Self-pay | Admitting: *Deleted

## 2013-04-30 DIAGNOSIS — M1712 Unilateral primary osteoarthritis, left knee: Secondary | ICD-10-CM

## 2013-04-30 NOTE — Telephone Encounter (Signed)
Sent order to [facility] for PT. Patient is aware to call and arrange therapy.

## 2013-05-15 ENCOUNTER — Ambulatory Visit (HOSPITAL_COMMUNITY)
Admission: RE | Admit: 2013-05-15 | Discharge: 2013-05-15 | Disposition: A | Discharge status: Home / Self Care | Payer: 59 | Source: Ambulatory Visit | Attending: Orthopedic Surgery | Admitting: Orthopedic Surgery

## 2013-05-15 DIAGNOSIS — R2689 Other abnormalities of gait and mobility: Secondary | ICD-10-CM

## 2013-05-15 DIAGNOSIS — R269 Unspecified abnormalities of gait and mobility: Secondary | ICD-10-CM | POA: Insufficient documentation

## 2013-05-15 DIAGNOSIS — M6281 Muscle weakness (generalized): Secondary | ICD-10-CM | POA: Insufficient documentation

## 2013-05-15 DIAGNOSIS — IMO0001 Reserved for inherently not codable concepts without codable children: Secondary | ICD-10-CM | POA: Insufficient documentation

## 2013-05-15 DIAGNOSIS — R29898 Other symptoms and signs involving the musculoskeletal system: Secondary | ICD-10-CM

## 2013-05-15 NOTE — Evaluation (Signed)
Physical Therapy Evaluation  Patient Details  Name: Julie May MRN: [id number] Date of Birth: [birth year]  Today's Date: 05/15/2013 Time: 0850-0935 PT Time Calculation (min): 45 min              Visit#: 1 of 1  Assessment Diagnosis:  (osteoarthritis)   Past Medical History:  Past Medical History  Diagnosis Date  . Overweight   . Hyperpotassemia   . Other abnormal glucose   . Polycythemia, secondary   . Routine general medical examination at a health care facility   . Trigger finger (acquired)   . Unspecified disorder of thyroid   . OA (osteoarthritis)   . Hyperlipidemia   . Hypertension     new dx 11/12/2010   Past Surgical History:  Past Surgical History  Procedure Laterality Date  . Tonsilectomy, adenoidectomy, bilateral myringotomy and tubes    . Tonsillectomy      Subjective Symptoms/Limitations Symptoms: Pt states that she does not have a lot of pain in her knee she has noticed increased fluid in her knee that was drawn off by Dr. Romeo Apple a few months ago.  She states that the MD stated that her knees are bone on bone.   How long can you sit comfortably?: no problem How long can you stand comfortably?: no problem How long can you walk comfortably?: no problem Special Tests: Pt states that steps going down are painful but not too bad. Pain Assessment Currently in Pain?: No/denies (medial pain going down steps 2/10)     Prior Functioning  Prior Function Vocation: Retired Leisure: Hobbies-yes (Comment) Comments: swim, bike    Sensation/Coordination/Flexibility/Functional Tests   foto 61 risk adjusted 48 Assessment RLE Strength Right Hip Flexion: 5/5 Right Hip Extension: 5/5 Right Hip ABduction: 5/5 Right Hip ADduction: 5/5 Right Knee Flexion: 5/5 Right Knee Extension: 5/5 Right Ankle Dorsiflexion: 3-/5 Right Ankle Plantar Flexion: 2+/5 Right Ankle Inversion: 3+/5 Right Ankle Eversion: 3-/5 LLE Strength Left Hip Flexion: 5/5 Left Hip Extension:  4/5 Left Hip ABduction: 5/5 Left Hip ADduction: 5/5 Left Knee Flexion: 4/5 Left Knee Extension: 5/5 Left Ankle Dorsiflexion: 3-/5 Left Ankle Plantar Flexion: 3-/5 Left Ankle Inversion: 3+/5 Left Ankle Eversion: 3-/5  Exercise/Treatments Mobility/Balance  Static Standing Balance Single Leg Stance - Right Leg: 2 Single Leg Stance - Left Leg: 2      Standing  tandem stance; SLS  Supine Straight Leg Raises: 10 reps Other Supine Knee Exercises: ankle isometric ex x 5 Prone  Hamstring Curl: 10 reps Hip Extension: 10 reps     Physical Therapy Assessment and Plan PT Assessment and Plan Clinical Impression Statement: Pt referred for a one time HEP for her Lt knee.   Pt assessment demonstrates decrased strength of hamsstrings and hip extensors as well as all anklle mm and decreased balance.  PT recieved HEP for the above. Pt will benefit from skilled therapeutic intervention in order to improve on the following deficits: Pain;Decreased balance;Decreased strength Rehab Potential: Good PT Plan: one time treatment    Goals Home Exercise Program Pt/caregiver will Perform Home Exercise Program: For increased strengthening  Problem List Patient Active Problem List   Diagnosis Date Noted  . Weakness of left leg 05/15/2013  . Balance problem 05/15/2013  . Scalp cyst 08/21/2012  . Sinusitis chronic, ethmoidal 03/22/2012  . Annual physical exam 12/07/2011  . Prediabetes 12/07/2011  . THYROID NODULE 01/18/2010  . HYPOTHYROIDISM 01/18/2010  . HYPERLIPIDEMIA 12/17/2009  . POSTMENOPAUSAL BLEEDING 12/17/2009  . OVERWEIGHT 09/13/2007  . THYROID  STIMULATING HORMONE, ABNORMAL 09/11/2006    PT Plan of Care PT Home Exercise Plan: given  GP    Alanta Scobey,CINDY 05/15/2013, 11:02 AM  Physician Documentation Your signature is required to indicate approval of the treatment plan as stated above.  Please sign and either send electronically or make a copy of this report for your files and  return this physician signed original.   Please mark one 1.__approve of plan  2. ___approve of plan with the following conditions.   ______________________________                                                          _____________________ Physician Signature                                                                                                             Date

## 2013-05-16 ENCOUNTER — Other Ambulatory Visit: Payer: Self-pay

## 2013-05-21 ENCOUNTER — Encounter: Payer: Self-pay | Admitting: Family Medicine

## 2013-05-21 ENCOUNTER — Ambulatory Visit (INDEPENDENT_AMBULATORY_CARE_PROVIDER_SITE_OTHER): Payer: 59 | Admitting: Family Medicine

## 2013-05-21 ENCOUNTER — Ambulatory Visit (HOSPITAL_COMMUNITY): Payer: 59 | Admitting: Physical Therapy

## 2013-05-21 VITALS — BP 120/82 | HR 88 | Resp 16 | Ht 65.0 in | Wt 173.4 lb

## 2013-05-21 DIAGNOSIS — E039 Hypothyroidism, unspecified: Secondary | ICD-10-CM

## 2013-05-21 DIAGNOSIS — L819 Disorder of pigmentation, unspecified: Secondary | ICD-10-CM

## 2013-05-21 DIAGNOSIS — R7309 Other abnormal glucose: Secondary | ICD-10-CM

## 2013-05-21 DIAGNOSIS — M79609 Pain in unspecified limb: Secondary | ICD-10-CM

## 2013-05-21 DIAGNOSIS — E663 Overweight: Secondary | ICD-10-CM

## 2013-05-21 DIAGNOSIS — R7303 Prediabetes: Secondary | ICD-10-CM

## 2013-05-21 DIAGNOSIS — M79671 Pain in right foot: Secondary | ICD-10-CM

## 2013-05-21 NOTE — Progress Notes (Signed)
  Subjective:    Patient ID: Julie May, female    DOB: 28-Mar-1951, 62 y.o.   MRN: [id number]  HPI  The PT is here for follow up and re-evaluation of chronic medical conditions, medication management and review of any available recent lab and radiology data.  Preventive health is updated, specifically  Cancer screening and Immunization.   Questions or concerns regarding consultations or procedures which the PT has had in the interim are  Addressed.Continues to follow thyroid disease with endo Saw ortho since lpast visit for left knee pain, was told she will need replacement eventually. Has been to podiatry for painful area on sole of right foot x 2 month insert recommended of no benefit The PT denies any adverse reactions to current medications since the last visit.  Wants to hold on flu vaccine at this time Working on healthy lifestyle changes with some weightloss, anxious to resume walking, now riding but still limited by foot pain   Review of Systems See HPI Denies recent fever or chills. Denies sinus pressure, nasal congestion, ear pain or sore throat. Denies chest congestion, productive cough or wheezing. Denies chest pains, palpitations and leg swelling Denies abdominal pain, nausea, vomiting,diarrhea or constipation.   Denies dysuria, frequency, hesitancy or incontinence.  Denies headaches, seizures, numbness, or tingling. Denies depression, anxiety or insomnia.       Objective:   Physical Exam  Patient alert and oriented and in no cardiopulmonary distress.  HEENT: No facial asymmetry, EOMI, no sinus tenderness,  oropharynx pink and moist.  Neck supple no adenopathy.  Chest: Clear to auscultation bilaterally.  CVS: S1, S2 no murmurs, no S3.  ABD: Soft non tender. Bowel sounds normal.  Ext: No edema  MS: Adequate ROM spine, shoulders, hips and reduced in  knees.  Skin: Intact, thickened callus on sole of right foot in ball of great toe. Hyperpigmented lesion  approx 2mm diameter in area also  Psych: Good eye contact, normal affect. Memory intact not anxious or depressed appearing.  CNS: CN 2-12 intact, power, tone and sensation normal throughout.       Assessment & Plan:

## 2013-05-21 NOTE — Patient Instructions (Addendum)
CPE and pap in May 2015   I will locate a professional in orthotics, to address your needs  Consider dermatology to evaluate the dark spot on your sole , if it enlarges, itches or bleeds that is not good. I think you should have dermatology evaluate now..   Work on weight loss through reduced portion size  And regular exrercise, this will protect your knees for a longer time  Weight loss goal of 5 pounds  Scedule to return for flu vaccine

## 2013-05-23 ENCOUNTER — Telehealth: Payer: Self-pay | Admitting: Family Medicine

## 2013-05-23 DIAGNOSIS — M79671 Pain in right foot: Secondary | ICD-10-CM | POA: Insufficient documentation

## 2013-05-23 HISTORY — DX: Pain in right foot: M79.671

## 2013-05-24 NOTE — Telephone Encounter (Signed)
Script written pls fax

## 2013-05-27 DIAGNOSIS — L819 Disorder of pigmentation, unspecified: Secondary | ICD-10-CM | POA: Insufficient documentation

## 2013-05-27 NOTE — Assessment & Plan Note (Addendum)
Improved. Pt applauded on succesful weight loss through lifestyle change, and encouraged to continue same. Weight loss goal set for the next several months. l months.

## 2013-05-27 NOTE — Assessment & Plan Note (Signed)
Noted on sole of right foot, new per pt. Dermatology eval; recommended, pt chooses to wait at this time

## 2013-05-27 NOTE — Assessment & Plan Note (Signed)
Patient educated about the importance of limiting  Carbohydrate intake , the need to commit to daily physical activity for a minimum of 30 minutes , and to commit weight loss. The fact that changes in all these areas will reduce or eliminate all together the development of diabetes is stressed.   Updated lab for next visit 

## 2013-05-27 NOTE — Assessment & Plan Note (Signed)
Managed by endo

## 2013-05-27 NOTE — Assessment & Plan Note (Signed)
Congenital abnormal foot shape, needs orthotics, also painful  callus on ball of foot with dark spot  Refer to orthotics

## 2013-05-31 ENCOUNTER — Ambulatory Visit (INDEPENDENT_AMBULATORY_CARE_PROVIDER_SITE_OTHER): Payer: 59

## 2013-05-31 DIAGNOSIS — Z23 Encounter for immunization: Secondary | ICD-10-CM

## 2013-10-08 ENCOUNTER — Ambulatory Visit (INDEPENDENT_AMBULATORY_CARE_PROVIDER_SITE_OTHER): Payer: 59

## 2013-10-08 ENCOUNTER — Ambulatory Visit (INDEPENDENT_AMBULATORY_CARE_PROVIDER_SITE_OTHER): Payer: 59 | Admitting: Orthopedic Surgery

## 2013-10-08 ENCOUNTER — Encounter: Payer: Self-pay | Admitting: Orthopedic Surgery

## 2013-10-08 VITALS — BP 140/84 | Ht 65.0 in | Wt 178.0 lb

## 2013-10-08 DIAGNOSIS — M129 Arthropathy, unspecified: Secondary | ICD-10-CM

## 2013-10-08 DIAGNOSIS — M1710 Unilateral primary osteoarthritis, unspecified knee: Secondary | ICD-10-CM

## 2013-10-08 DIAGNOSIS — M1990 Unspecified osteoarthritis, unspecified site: Secondary | ICD-10-CM

## 2013-10-08 DIAGNOSIS — M1712 Unilateral primary osteoarthritis, left knee: Secondary | ICD-10-CM

## 2013-10-08 DIAGNOSIS — IMO0002 Reserved for concepts with insufficient information to code with codable children: Secondary | ICD-10-CM

## 2013-10-08 NOTE — Progress Notes (Signed)
Subjective:     Patient ID: Julie May, female   DOB: 1950/08/20, 63 y.o.   MRN: [id number]  Chief Complaint  Patient presents with  . Follow-up    6 month recheck left knee    HPI 6 month followup status post initial evaluation for knee pain found to have severe patellofemoral arthritis and mild tibiofemoral arthritis. Activity modification injection were instituted and the patient is essentially asymptomatic.  Review of Systems denies catching or locking or mechanical symptoms  Previous history Chief Complaint   Patient presents with   .  Knee Pain       Left knee pain and popping , no injuury,     HISTORY: 2 week history of left knee pain and swelling   She denies any trauma, but does complain of 4/10 dull pain with intermittent symptoms. She does complain that when she bends her knee her knee pops she feels grinding and has joint swelling. Her symptoms are worse with activity slightly better with Aleve and and with slight flexion of the knee.     Objective:   Physical Exam BP 140/84  Ht 5\' 5"  (1.651 m)  Wt 178 lb (80.74 kg)  BMI 29.62 kg/m2 General appearance is normal, the patient is alert and oriented x3 with normal mood and affect. Ambulation remains intact. 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

## 2013-11-20 ENCOUNTER — Other Ambulatory Visit (HOSPITAL_COMMUNITY)
Admission: RE | Admit: 2013-11-20 | Discharge: 2013-11-20 | Disposition: A | Discharge status: Home / Self Care | Payer: 59 | Source: Ambulatory Visit | Attending: Family Medicine | Admitting: Family Medicine

## 2013-11-20 ENCOUNTER — Encounter: Payer: Self-pay | Admitting: Family Medicine

## 2013-11-20 ENCOUNTER — Ambulatory Visit (INDEPENDENT_AMBULATORY_CARE_PROVIDER_SITE_OTHER): Payer: 59 | Admitting: Family Medicine

## 2013-11-20 VITALS — BP 122/82 | HR 63 | Resp 16 | Wt 176.1 lb

## 2013-11-20 DIAGNOSIS — L819 Disorder of pigmentation, unspecified: Secondary | ICD-10-CM

## 2013-11-20 DIAGNOSIS — Z1211 Encounter for screening for malignant neoplasm of colon: Secondary | ICD-10-CM

## 2013-11-20 DIAGNOSIS — Z0000 Encounter for general adult medical examination without abnormal findings: Secondary | ICD-10-CM

## 2013-11-20 DIAGNOSIS — Z1151 Encounter for screening for human papillomavirus (HPV): Secondary | ICD-10-CM | POA: Insufficient documentation

## 2013-11-20 DIAGNOSIS — Z01419 Encounter for gynecological examination (general) (routine) without abnormal findings: Secondary | ICD-10-CM | POA: Insufficient documentation

## 2013-11-20 LAB — POC HEMOCCULT BLD/STL (OFFICE/1-CARD/DIAGNOSTIC): Fecal Occult Blood, POC: NEGATIVE

## 2013-11-20 NOTE — Patient Instructions (Addendum)
F/u in 6 month, call if you need me before  Weight loss goal of 6 pounds  Please start asprin 81 mg daily for stroke risk reduction  Concentrate on  healthy lifestyle.  Please try to ensure intake of 1000 to 1200mg  calcium daily and daily physical activity for bone health.  You are referred to dematology  for skin evaluation

## 2013-11-24 NOTE — Assessment & Plan Note (Signed)
Annual exam as documented. Pt will be more consistent in healthy ;lifestyle changes to lose weight, also to start daily aspirin, considering annual mammogram, still not committed in this regard

## 2013-11-24 NOTE — Progress Notes (Signed)
   Subjective:    Patient ID: Julie May, female    DOB: March 03, 1951, 63 y.o.   MRN: [id number]  HPI Pt in for annual physical exam. Doing well, thyroid is managed and controlled by endo Her weight challenge continues and she is working on this.Plans to reduce portion size, she does have healthy food choice as wellas to commit to 150 mins or more of physical exercise She is electing to do mammograms every other year, states she has no f/h of breast cancer, I encourage her to reconsider and chose annual mammograms, she will think about this Does have a concern about a pigmented lesion she noted recently in left forehead area, and has had lesions removed in the past, no dx of cancer however   Review of Systems See HPI Denies recent fever or chills. Denies sinus pressure, nasal congestion, ear pain or sore throat. Denies chest congestion, productive cough or wheezing. Denies chest pains, palpitations and leg swelling Denies abdominal pain, nausea, vomiting,diarrhea or constipation.   Denies dysuria, frequency, hesitancy or incontinence. Denies joint pain, swelling and limitation in mobility. Denies headaches, seizures, numbness, or tingling. Denies depression, anxiety or insomnia. Denies skin break down or rash.        Objective:   Physical Exam Pleasant well nourished female, alert and oriented x 3, in no cardio-pulmonary distress. Afebrile. HEENT No facial trauma or asymetry. Sinuses non tender.  EOMI, PERTL, fundoscopic exam no hemorhage or exudate.  External ears normal, tympanic membranes clear. Oropharynx moist, no exudate, good dentition. Neck: supple, no adenopathy,JVD or thyromegaly.No bruits.  Chest: Clear to ascultation bilaterally.No crackles or wheezes. Non tender to palpation  Breast: No asymetry,no masses. No nipple discharge or inversion. No axillary or supraclavicular adenopathy  Cardiovascular system; Heart sounds normal,  S1 and  S2 ,no S3.  No murmur,  or thrill. Apical beat not displaced Peripheral pulses normal.  Abdomen: Soft, non tender, no organomegaly or masses. No bruits. Bowel sounds normal. No guarding, tenderness or rebound.  Rectal:  No mass. Guaiac negative stool.  GU: External genitalia normal. No lesions. Vaginal canal normal.No discharge. Uterus normal size, no adnexal masses, no cervical motion or adnexal tenderness.  Musculoskeletal exam: Full ROM of spine, hips , shoulders and knees. No deformity ,swelling or crepitus noted. No muscle wasting or atrophy.   Neurologic: Cranial nerves 2 to 12 intact. Power, tone ,sensation and reflexes normal throughout. No disturbance in gait. No tremor.  Skin: Intact, no ulceration, erythema , scaling or rash noted. Hyperpigmented tan colored flat lesions noted on upper extremities and also on the face in area of concern  Psych; Normal mood and affect. Judgement and concentration normal        Assessment & Plan:  Annual physical exam Annual exam as documented. Pt will be more consistent in healthy ;lifestyle changes to lose weight, also to start daily aspirin, considering annual mammogram, still not committed in this regard   Pigmented skin lesion Multiple skin lesions , h/o prior cryotherapy, npo known skin cancer. None of these lesions look suspicious , however , pt needs the benefit of dermatology eval and referral entered

## 2013-11-24 NOTE — Assessment & Plan Note (Signed)
Multiple skin lesions , h/o prior cryotherapy, npo known skin cancer. None of these lesions look suspicious , however , pt needs the benefit of dermatology eval and referral entered

## 2014-04-08 ENCOUNTER — Ambulatory Visit (INDEPENDENT_AMBULATORY_CARE_PROVIDER_SITE_OTHER): Payer: 59

## 2014-04-08 ENCOUNTER — Encounter: Payer: Self-pay | Admitting: Orthopedic Surgery

## 2014-04-08 ENCOUNTER — Ambulatory Visit (INDEPENDENT_AMBULATORY_CARE_PROVIDER_SITE_OTHER): Payer: 59 | Admitting: Orthopedic Surgery

## 2014-04-08 VITALS — BP 146/91 | Ht 65.0 in | Wt 176.1 lb

## 2014-04-08 DIAGNOSIS — M25562 Pain in left knee: Secondary | ICD-10-CM

## 2014-04-08 DIAGNOSIS — M25569 Pain in unspecified knee: Secondary | ICD-10-CM

## 2014-04-08 DIAGNOSIS — M1710 Unilateral primary osteoarthritis, unspecified knee: Secondary | ICD-10-CM

## 2014-04-08 DIAGNOSIS — M1712 Unilateral primary osteoarthritis, left knee: Secondary | ICD-10-CM

## 2014-04-08 NOTE — Progress Notes (Signed)
Followup visit  Chief Complaint  Patient presents with  . Follow-up    6 month recheck + xray Lt knee    BP 146/91  Ht 5\' 5"  (1.651 m)  Wt 176 lb 1.9 oz (79.888 kg)  BMI 29.31 kg/m2  Encounter Diagnosis  Name Primary?  . Left knee pain Yes    This is a 6 month followup visit for [facility] who has osteoarthritis of the left knee she is here for six-month x-ray recheck and examination. She reports no pain in her knee at this time and no functional deficits  Review of systems she is not having any catching locking or giving way  Her vital signs are stable as recorded she is awake alert and oriented x3 her mood and affect are normal she has excellent appearance she walks without any supportive devices her knee flexion is 120 she has slight valgus alignment to the knee is otherwise stable with good muscle tone and strength  Her x-ray shows valgus osteoarthritis with severe patellofemoral disease more so than tibiofemoral disease.  She's doing well and has no functional deficits I think she can wait a year before we have to re\re x-ray her unless her symptoms change

## 2014-05-28 ENCOUNTER — Ambulatory Visit (INDEPENDENT_AMBULATORY_CARE_PROVIDER_SITE_OTHER): Payer: 59

## 2014-05-28 ENCOUNTER — Ambulatory Visit (INDEPENDENT_AMBULATORY_CARE_PROVIDER_SITE_OTHER): Payer: 59 | Admitting: Family Medicine

## 2014-05-28 ENCOUNTER — Encounter: Payer: Self-pay | Admitting: Family Medicine

## 2014-05-28 VITALS — BP 152/84 | HR 74 | Resp 16 | Ht 65.0 in | Wt 174.8 lb

## 2014-05-28 DIAGNOSIS — E038 Other specified hypothyroidism: Secondary | ICD-10-CM

## 2014-05-28 DIAGNOSIS — R7309 Other abnormal glucose: Secondary | ICD-10-CM

## 2014-05-28 DIAGNOSIS — R7303 Prediabetes: Secondary | ICD-10-CM

## 2014-05-28 DIAGNOSIS — Z23 Encounter for immunization: Secondary | ICD-10-CM

## 2014-05-28 DIAGNOSIS — E785 Hyperlipidemia, unspecified: Secondary | ICD-10-CM

## 2014-05-28 DIAGNOSIS — N9489 Other specified conditions associated with female genital organs and menstrual cycle: Secondary | ICD-10-CM

## 2014-05-28 DIAGNOSIS — R030 Elevated blood-pressure reading, without diagnosis of hypertension: Secondary | ICD-10-CM

## 2014-05-28 DIAGNOSIS — N898 Other specified noninflammatory disorders of vagina: Secondary | ICD-10-CM

## 2014-05-28 DIAGNOSIS — Z1211 Encounter for screening for malignant neoplasm of colon: Secondary | ICD-10-CM

## 2014-05-28 NOTE — Patient Instructions (Addendum)
F/U end January, call if you need me before  Pls get fasting lipid, cmp , HBA1C and cBc the same day you get labs for Dr nida  Please bring your blood pressure cuff to next visit, so I can determine if I agree with "white coat syndrome" or if you do have blood pressure which needs to be treated  Please start aspirin 81mg  once daily to reduce the risk of stroke  Yopu are referred to Dr Laural Golden for screening colonoscopy due in 07/2014 , his office will mail you a letter to call and scheduule  You are referred to Dr Glo Herring for gynecologic exam    Pls continue  To do regular physical activity and work on eating mainly vegetable and fruit

## 2014-05-29 ENCOUNTER — Encounter (INDEPENDENT_AMBULATORY_CARE_PROVIDER_SITE_OTHER): Payer: Self-pay | Admitting: *Deleted

## 2014-06-04 ENCOUNTER — Ambulatory Visit (INDEPENDENT_AMBULATORY_CARE_PROVIDER_SITE_OTHER): Payer: 59 | Admitting: Obstetrics and Gynecology

## 2014-06-04 ENCOUNTER — Encounter: Payer: Self-pay | Admitting: Obstetrics and Gynecology

## 2014-06-04 VITALS — BP 140/90 | Ht 65.0 in | Wt 170.0 lb

## 2014-06-04 DIAGNOSIS — N369 Urethral disorder, unspecified: Secondary | ICD-10-CM

## 2014-06-04 HISTORY — DX: Urethral disorder, unspecified: N36.9

## 2014-06-04 NOTE — Progress Notes (Signed)
Patient ID: [name], female   DOB: 03/05/1951, 63 y.o.   MRN: [id number]   [facility] Visit  Patient name: Julie May MRN [id number]  Date of birth: 06/29/1951  CC & HPI:  Julie May is a 63 y.o. female presenting today for vaginal mass. She is being seen by Dr. Moshe Cipro and she informed them to do a pelvic. She felt a mass that was the size of a marble and became concerned. She states that she is having associated symptoms of pelvic pressure. She denies any discharge, vaginal bleeding, and any other symptoms. She has not had any   ROS:  +Vaginal mass +Pelvic Pressure -Vaginal bleeding -Vaginal discharge No other complaints  Pertinent History Reviewed:   Reviewed: Significant for  Medical         Past Medical History  Diagnosis Date  . Overweight(278.02)   . Hyperpotassemia   . Other abnormal glucose   . Polycythemia, secondary   . Routine general medical examination at a health care facility   . Trigger finger (acquired)   . Unspecified disorder of thyroid   . OA (osteoarthritis)   . Hyperlipidemia   . Hypertension     new dx 11/12/2010                              Surgical Hx:    Past Surgical History  Procedure Laterality Date  . Tonsilectomy, adenoidectomy, bilateral myringotomy and tubes    . Tonsillectomy    . Uterine polyp removal      Medications: Reviewed & Updated - see associated section                      Current outpatient prescriptions: Calcium Carbonate-Vitamin D (CALCIUM 600+D) 600-400 MG-UNIT per tablet, Take 2 tablets by mouth daily., Disp: , Rfl: ;  levothyroxine (SYNTHROID, LEVOTHROID) 50 MCG tablet, Take 50 mcg by mouth daily before breakfast., Disp: , Rfl: ;  Multiple Vitamin (MULTIVITAMIN) tablet, Take 1 tablet by mouth daily.  , Disp: , Rfl:    Social History: Reviewed -  reports that she has never smoked. She has never used smokeless tobacco.  Objective Findings:  Vitals: Blood pressure 140/90, height 5\' 5"  (1.651 m), weight 170  lb (77.111 kg).  Physical Examination:  Pelvic - normal external genitalia, vulva, vagina, cervix, uterus and adnexa,  VULVA: normal appearing vulva with no masses, tenderness or lesions,  VAGINA: normal appearing vagina with normal color and discharge, no lesions, great muscle tone at the vaginal entrance.  CERVIX: normal appearing cervix without discharge or lesions,  UTERUS: uterus is normal size, shape, consistency and nontender,  ADNEXA: normal adnexa in size, nontender and no masses  Urethra forcibly more prominent due to the thickening of the pubic bone.   Assessment & Plan:   A:  1. Normal body changes urethral prominence. 2. Urethra more prominent due to the thickening of the pubic bone.   P:  1. F/U PRN  This chart was scribed for Julie Kind, MD by Steva Colder, ED Scribe. The patient was seen in room 1 at 3:11 PM.

## 2014-06-04 NOTE — Progress Notes (Deleted)
°   [facility] Visit  Patient name: Julie May MRN [id number]  Date of birth: 24-Feb-1951  CC & HPI:  Julie May is a 63 y.o. female presenting today for vaginal mass. She is being seen by Dr. Moshe Cipro and she informed them to do a pelvic. She felt a mass that was the size of a marble and became concerned. She states that she is having associated symptoms of pelvic pressure. She denies any discharge, vaginal bleeding, and any other symptoms. She has not had any   ROS:  +Vaginal mass +Pelvic Pressure -Vaginal bleeding -Vaginal discharge No other complaints  Pertinent History Reviewed:   Reviewed: Significant for  Medical         Past Medical History  Diagnosis Date   Overweight(278.02)    Hyperpotassemia    Other abnormal glucose    Polycythemia, secondary    Routine general medical examination at a health care facility    Trigger finger (acquired)    Unspecified disorder of thyroid    OA (osteoarthritis)    Hyperlipidemia    Hypertension     new dx 11/12/2010                              Surgical Hx:    Past Surgical History  Procedure Laterality Date   Tonsilectomy, adenoidectomy, bilateral myringotomy and tubes     Tonsillectomy     Uterine polyp removal      Medications: Reviewed & Updated - see associated section                      Current outpatient prescriptions: Calcium Carbonate-Vitamin D (CALCIUM 600+D) 600-400 MG-UNIT per tablet, Take 2 tablets by mouth daily., Disp: , Rfl: ;  levothyroxine (SYNTHROID, LEVOTHROID) 50 MCG tablet, Take 50 mcg by mouth daily before breakfast., Disp: , Rfl: ;  Multiple Vitamin (MULTIVITAMIN) tablet, Take 1 tablet by mouth daily.  , Disp: , Rfl:    Social History: Reviewed -  reports that she has never smoked. She has never used smokeless tobacco.  Objective Findings:  Vitals: Blood pressure 140/90, height 5\' 5"  (1.651 m), weight 170 lb (77.111 kg).  Physical Examination:  Pelvic - normal external  genitalia, vulva, vagina, cervix, uterus and adnexa,  VULVA: normal appearing vulva with no masses, tenderness or lesions,  VAGINA: normal appearing vagina with normal color and discharge, no lesions, great muscle tone at the vaginal entrance.  CERVIX: normal appearing cervix without discharge or lesions,  UTERUS: uterus is normal size, shape, consistency and nontender,  ADNEXA: normal adnexa in size, nontender and no masses  Urethra forcibly more prominent due to the thickening of the pubic bone.   Assessment & Plan:   A:  1. Normal body changes urethral prominence. 2. Urethra more prominent due to the thickening of the pubic bone.   P:  1. F/U PRN  This chart was scribed for [name], MD by Steva Colder, ED Scribe. The patient was seen in room 1 at 3:11 PM.

## 2014-06-04 NOTE — Progress Notes (Signed)
Patient ID: Julie May, female   DOB: [birth year], 63 y.o.   MRN: [id number] Pt here today for vaginal mass. Pt state that she has had this for about month. Pt states that she has a little pelvic pressure but denies any severe pain. Pt denies any discharge.

## 2014-06-16 ENCOUNTER — Encounter: Payer: Self-pay | Admitting: Obstetrics and Gynecology

## 2014-07-13 DIAGNOSIS — Z23 Encounter for immunization: Secondary | ICD-10-CM | POA: Insufficient documentation

## 2014-07-13 DIAGNOSIS — R030 Elevated blood-pressure reading, without diagnosis of hypertension: Secondary | ICD-10-CM | POA: Insufficient documentation

## 2014-07-13 NOTE — Assessment & Plan Note (Signed)
unchanged Patient re-educated about  the importance of commitment to a  minimum of 150 minutes of exercise per week. The importance of healthy food choices with portion control discussed. Encouraged to start a food diary, count calories and to consider  joining a support group. Sample diet sheets offered. Goals set by the patient for the next several months.    

## 2014-07-13 NOTE — Assessment & Plan Note (Signed)
Physical exam reveals hard lesion palpable in vaginal canal, will refer to gyne for further eval and definitive management, soonest available appt

## 2014-07-13 NOTE — Assessment & Plan Note (Signed)
Patient educated about the importance of limiting  Carbohydrate intake , the need to commit to daily physical activity for a minimum of 30 minutes , and to commit weight loss. The fact that changes in all these areas will reduce or eliminate all together the development of diabetes is stressed.   Updated lab needed at/ before next visit.  

## 2014-07-13 NOTE — Assessment & Plan Note (Signed)
Controlled an d treated by endo 

## 2014-07-13 NOTE — Progress Notes (Signed)
Subjective:    Patient ID: Julie May, female    DOB: 12/06/1950, 64 y.o.   MRN: [id number]  HPI The PT is here for follow up and re-evaluation of chronic medical conditions, medication management and review of any available recent lab and radiology data.  Preventive health is updated, specifically  Cancer screening and Immunization.   Continues to follow with endo regularly. The PT denies any adverse reactions to current medications since the last visit.  1 month h/o hard swelling in vaginal canal, no discharge or dyspareunia No consistent change in diet, no weight loss      Review of Systems See HPI Denies recent fever or chills. Denies sinus pressure, nasal congestion, ear pain or sore throat. Denies chest congestion, productive cough or wheezing. Denies chest pains, palpitations and leg swelling Denies abdominal pain, nausea, vomiting,diarrhea or constipation.   Denies dysuria, frequency, hesitancy or incontinence. Denies joint pain, swelling and limitation in mobility. Denies headaches, seizures, numbness, or tingling. Denies depression, anxiety or insomnia. Denies skin break down or rash.        Objective:   Physical Exam BP 152/84 mmHg  Pulse 74  Resp 16  Ht 5\' 5"  (1.651 m)  Wt 174 lb 12.8 oz (79.289 kg)  BMI 29.09 kg/m2  SpO2 97% Patient alert and oriented and in no cardiopulmonary distress.  HEENT: No facial asymmetry, EOMI,   oropharynx pink and moist.  Neck supple no JVD, no mass.  Chest: Clear to auscultation bilaterally.  CVS: S1, S2 no murmurs, no S3.Regular rate.  ABD: Soft non tender. No organomegaly or mass Pelvic: Normal external female genitalia. No ulcer, female distribution of hair Uterus : normal size, no adnexal masses. Firm lesion palpated in anterior vaginal wall, non tender, no drainage  Ext: No edema  MS: Adequate ROM spine, shoulders, hips and knees.  Skin: Intact, no ulcerations or rash noted.  Psych: Good eye contact, normal  affect. Memory intact not anxious or depressed appearing.  CNS: CN 2-12 intact, power,  normal throughout.no focal deficits noted.        Assessment & Plan:  Vaginal mass Physical exam reveals hard lesion palpable in vaginal canal, will refer to gyne for further eval and definitive management, soonest available appt  OVERWEIGHT unchanged. Patient re-educated about  the importance of commitment to a  minimum of 150 minutes of exercise per week. The importance of healthy food choices with portion control discussed. Encouraged to start a food diary, count calories and to consider  joining a support group. Sample diet sheets offered. Goals set by the patient for the next several months.     Hyperlipemia Hyperlipidemia:Low fat diet discussed and encouraged.  Updated lab needed at/ before next visit.   Prediabetes Patient educated about the importance of limiting  Carbohydrate intake , the need to commit to daily physical activity for a minimum of 30 minutes , and to commit weight loss. The fact that changes in all these areas will reduce or eliminate all together the development of diabetes is stressed.   Updated lab needed at/ before next visit.   Hypothyroidism Controlled and treated by endo  Need for prophylactic vaccination and inoculation against influenza Vaccine administered at visit.   Elevated blood pressure reading without diagnosis of hypertension Pt reports normal blood pressure readings on her home meter, she will bring to next visit, has carried a dx of white coat syndrome in the past DASH diet and commitment to daily physical activity for a minimum  of 30 minutes discussed and encouraged, as a part of hypertension management. The importance of attaining a healthy weight is also discussed.

## 2014-07-13 NOTE — Assessment & Plan Note (Signed)
Vaccine administered at visit.  

## 2014-07-13 NOTE — Assessment & Plan Note (Signed)
Hyperlipidemia:Low fat diet discussed and encouraged.  Updated lab needed at/ before next visit.  

## 2014-07-13 NOTE — Assessment & Plan Note (Signed)
Pt reports normal blood pressure readings on her home meter, she will bring to next visit, has carried a dx of white coat syndrome in the past DASH diet and commitment to daily physical activity for a minimum of 30 minutes discussed and encouraged, as a part of hypertension management. The importance of attaining a healthy weight is also discussed.

## 2014-08-05 ENCOUNTER — Ambulatory Visit: Payer: 59 | Admitting: Family Medicine

## 2014-09-30 ENCOUNTER — Ambulatory Visit (INDEPENDENT_AMBULATORY_CARE_PROVIDER_SITE_OTHER): Payer: 59 | Admitting: Family Medicine

## 2014-09-30 ENCOUNTER — Encounter: Payer: Self-pay | Admitting: Family Medicine

## 2014-09-30 VITALS — BP 110/70 | HR 76 | Resp 18 | Ht 65.0 in | Wt 173.0 lb

## 2014-09-30 DIAGNOSIS — E663 Overweight: Secondary | ICD-10-CM | POA: Diagnosis not present

## 2014-09-30 DIAGNOSIS — R7303 Prediabetes: Secondary | ICD-10-CM

## 2014-09-30 DIAGNOSIS — R030 Elevated blood-pressure reading, without diagnosis of hypertension: Secondary | ICD-10-CM | POA: Diagnosis not present

## 2014-09-30 DIAGNOSIS — Z13820 Encounter for screening for osteoporosis: Secondary | ICD-10-CM | POA: Diagnosis not present

## 2014-09-30 DIAGNOSIS — E785 Hyperlipidemia, unspecified: Secondary | ICD-10-CM | POA: Diagnosis not present

## 2014-09-30 DIAGNOSIS — R7309 Other abnormal glucose: Secondary | ICD-10-CM

## 2014-09-30 DIAGNOSIS — E038 Other specified hypothyroidism: Secondary | ICD-10-CM

## 2014-09-30 NOTE — Patient Instructions (Addendum)
F/u in 6 month, call iof you need me before  Blood pressure today is normal and labs have improved a lot, PLEASE keep doing what you are doing!  Weight loss goal of 5 pounds  Please schedule your colonoscopy within the next 3 months, past due  You will be referred for a bone density scan end August and I will ask Dr Dorris Fetch for help with this also since you already see him  For bone health continue daily exercise ciommitment, adding weights when you walk, about 5 pounds will help, also commit to calcium 1000mg  to 1200 mg daily, getting through food not supplement is most current recommendation  OK to stop asprin since you bruised, focus on lifestyle  For stroke risk reduction

## 2014-10-07 ENCOUNTER — Other Ambulatory Visit (HOSPITAL_COMMUNITY): Payer: 59

## 2014-10-20 ENCOUNTER — Encounter: Payer: Self-pay | Admitting: Family Medicine

## 2014-11-09 NOTE — Assessment & Plan Note (Signed)
Adequately treated on medication pt tolerates. Treated by endo

## 2014-11-09 NOTE — Progress Notes (Signed)
   Julie May     MRN: [id number]      DOB: 05/09/1951   HPI Julie May is here for follow up and re-evaluation of chronic medical conditions, medication management and review of any available recent lab and radiology data.  Preventive health is updated, specifically  Cancer screening and Immunization.   Questions or concerns regarding consultations or procedures which the PT has had in the interim are  addressed. The PT denies any adverse reactions to current medications since the last visit.  There are no new concerns.  There are no specific complaints   ROS Denies recent fever or chills. Denies sinus pressure, nasal congestion, ear pain or sore throat. Denies chest congestion, productive cough or wheezing. Denies chest pains, palpitations and leg swelling Denies abdominal pain, nausea, vomiting,diarrhea or constipation.   Denies dysuria, frequency, hesitancy or incontinence. Denies joint pain, swelling and limitation in mobility. Denies headaches, seizures, numbness, or tingling. Denies depression, anxiety or insomnia. Denies skin break down or rash.   PE  BP 110/70 mmHg  Pulse 76  Resp 18  Ht 5\' 5"  (1.651 m)  Wt 173 lb 0.6 oz (78.49 kg)  BMI 28.80 kg/m2  SpO2 97%  Patient alert and oriented and in no cardiopulmonary distress.  HEENT: No facial asymmetry, EOMI,   oropharynx pink and moist.  Neck supple no JVD, no mass.  Chest: Clear to auscultation bilaterally.  CVS: S1, S2 no murmurs, no S3.Regular rate.  ABD: Soft non tender.   Ext: No edema  MS: Adequate ROM spine, shoulders, hips and knees.  Skin: Intact, no ulcerations or rash noted.  Psych: Good eye contact, normal affect. Memory intact not anxious or depressed appearing.  CNS: CN 2-12 intact, power,  normal throughout.no focal deficits noted.   Assessment & Plan   Elevated blood pressure reading without diagnosis of hypertension Normal BP at visit with lifestyle modification, pt applauded on this and  will not require anti hypertensive medication at this trime   Overweight Unchanged. Patient re-educated about  the importance of commitment to a  minimum of 150 minutes of exercise per week.  The importance of healthy food choices with portion control discussed. Encouraged to start a food diary, count calories and to consider  joining a support group. Sample diet sheets offered. Goals set by the patient for the next several months.   Weight /BMI 09/30/2014 06/04/2014 05/28/2014  WEIGHT 173 lb 0.6 oz 170 lb 174 lb 12.8 oz  HEIGHT 5\' 5"  5\' 5"  5\' 5"   BMI 28.8 kg/m2 28.29 kg/m2 29.09 kg/m2    Current exercise per week 150 minutes.    Hyperlipemia Hyperlipidemia:Low fat diet discussed and encouraged.   Improved with dietary chnage       Hypothyroidism Adequately treated on medication pt tolerates. Treated by endo   Prediabetes Patient educated about the importance of limiting  Carbohydrate intake , the need to commit to daily physical activity for a minimum of 30 minutes , and to commit weight loss. The fact that changes in all these areas will reduce or eliminate all together the development of diabetes is stressed.

## 2014-11-09 NOTE — Assessment & Plan Note (Signed)
Normal BP at visit with lifestyle modification, pt applauded on this and will not require anti hypertensive medication at this trime

## 2014-11-09 NOTE — Assessment & Plan Note (Signed)
Hyperlipidemia:Low fat diet discussed and encouraged.   Improved with dietary chnage

## 2014-11-09 NOTE — Assessment & Plan Note (Signed)
Patient educated about the importance of limiting  Carbohydrate intake , the need to commit to daily physical activity for a minimum of 30 minutes , and to commit weight loss. The fact that changes in all these areas will reduce or eliminate all together the development of diabetes is stressed.    

## 2014-11-09 NOTE — Assessment & Plan Note (Signed)
Unchanged. Patient re-educated about  the importance of commitment to a  minimum of 150 minutes of exercise per week.  The importance of healthy food choices with portion control discussed. Encouraged to start a food diary, count calories and to consider  joining a support group. Sample diet sheets offered. Goals set by the patient for the next several months.   Weight /BMI 09/30/2014 06/04/2014 05/28/2014  WEIGHT 173 lb 0.6 oz 170 lb 174 lb 12.8 oz  HEIGHT 5\' 5"  5\' 5"  5\' 5"   BMI 28.8 kg/m2 28.29 kg/m2 29.09 kg/m2    Current exercise per week 150 minutes.

## 2015-03-17 ENCOUNTER — Other Ambulatory Visit (HOSPITAL_COMMUNITY): Payer: 59

## 2015-03-25 ENCOUNTER — Ambulatory Visit (HOSPITAL_COMMUNITY)
Admission: RE | Admit: 2015-03-25 | Discharge: 2015-03-25 | Disposition: A | Discharge status: Home / Self Care | Payer: 59 | Source: Ambulatory Visit | Attending: Family Medicine | Admitting: Family Medicine

## 2015-03-25 ENCOUNTER — Ambulatory Visit: Payer: 59 | Admitting: Family Medicine

## 2015-03-25 DIAGNOSIS — Z780 Asymptomatic menopausal state: Secondary | ICD-10-CM | POA: Diagnosis not present

## 2015-03-25 DIAGNOSIS — Z13820 Encounter for screening for osteoporosis: Secondary | ICD-10-CM | POA: Diagnosis present

## 2015-03-31 ENCOUNTER — Ambulatory Visit (INDEPENDENT_AMBULATORY_CARE_PROVIDER_SITE_OTHER): Payer: 59 | Admitting: Family Medicine

## 2015-03-31 ENCOUNTER — Encounter: Payer: Self-pay | Admitting: Family Medicine

## 2015-03-31 VITALS — BP 134/82 | HR 73 | Resp 16 | Ht 65.0 in | Wt 178.0 lb

## 2015-03-31 DIAGNOSIS — M8580 Other specified disorders of bone density and structure, unspecified site: Secondary | ICD-10-CM

## 2015-03-31 DIAGNOSIS — Z1159 Encounter for screening for other viral diseases: Secondary | ICD-10-CM | POA: Diagnosis not present

## 2015-03-31 DIAGNOSIS — E559 Vitamin D deficiency, unspecified: Secondary | ICD-10-CM

## 2015-03-31 DIAGNOSIS — E663 Overweight: Secondary | ICD-10-CM

## 2015-03-31 DIAGNOSIS — R030 Elevated blood-pressure reading, without diagnosis of hypertension: Secondary | ICD-10-CM | POA: Diagnosis not present

## 2015-03-31 DIAGNOSIS — R7309 Other abnormal glucose: Secondary | ICD-10-CM

## 2015-03-31 DIAGNOSIS — E038 Other specified hypothyroidism: Secondary | ICD-10-CM

## 2015-03-31 DIAGNOSIS — R7303 Prediabetes: Secondary | ICD-10-CM

## 2015-03-31 NOTE — Progress Notes (Signed)
Subjective:    Patient ID: [name], female    DOB: [birth year], 64 y.o.   MRN: [id number]  HPI   BRITTEN PARADY     MRN: [id number]      DOB: 15-Nov-1950   HPI Ms. Zech is here for follow up and re-evaluation of chronic medical conditions, medication management and review of any available recent lab and radiology data.  Preventive health is updated, specifically  Cancer screening and Immunization.   Questions or concerns regarding consultations or procedures which the PT has had in the interim are  addressed. The PT denies any adverse reactions to current medications since the last visit.  There are no new concerns.  There are no specific complaints   ROS Denies recent fever or chills. Denies sinus pressure, nasal congestion, ear pain or sore throat. Denies chest congestion, productive cough or wheezing. Denies chest pains, palpitations and leg swelling Denies abdominal pain, nausea, vomiting,diarrhea or constipation.   Denies dysuria, frequency, hesitancy or incontinence. Denies joint pain, swelling and limitation in mobility. Denies headaches, seizures, numbness, or tingling. Denies depression, anxiety or insomnia. Denies skin break down or rash.   PE  BP 134/82 mmHg  Pulse 73  Resp 16  Ht 5\' 5"  (1.651 m)  Wt 178 lb (80.74 kg)  BMI 29.62 kg/m2  SpO2 97%  Patient alert and oriented and in no cardiopulmonary distress.  HEENT: No facial asymmetry, EOMI,   oropharynx pink and moist.  Neck supple no JVD, no mass.  Chest: Clear to auscultation bilaterally.  CVS: S1, S2 no murmurs, no S3.Regular rate.  ABD: Soft non tender.   Ext: No edema  MS: Adequate ROM spine, shoulders, hips and knees.  Skin: Intact, no ulcerations or rash noted.  Psych: Good eye contact, normal affect. Memory intact not anxious or depressed appearing.  CNS: CN 2-12 intact, power,  normal throughout.no focal deficits noted.   Assessment & Plan   Hypothyroidism Managed by endo, recent  need for dose increase in medication which she is tolerating well  Prediabetes Patient educated about the importance of limiting  Carbohydrate intake , the need to commit to daily physical activity for a minimum of 30 minutes , and to commit weight loss. The fact that changes in all these areas will reduce or eliminate all together the development of diabetes is stressed.  Slight improvement   Diabetic Labs Latest Ref Rng 03/22/2012 12/02/2011 11/12/2010 11/10/2010 05/18/2010  HbA1c <5.7 % 5.9(H) 6.2(H) 5.7(H) 5.9(H) -  Chol 0 - 200 mg/dL - 180 186 - 195  HDL >39 mg/dL - 66 63 - 64  Calc LDL 0 - 99 mg/dL - 104(H) 112(H) - 122(H)  Triglycerides <150 mg/dL - 52 53 - 45  Creatinine 0.40 - 1.20 mg/dL - - 1.04 - -   BP/Weight 03/31/2015 09/30/2014 06/04/2014 05/28/2014 04/08/2014 11/20/2013 0/94/7096  Systolic BP 283 662 947 654 650 354 656  Diastolic BP 82 70 90 84 91 82 84  Wt. (Lbs) 178 173.04 170 174.8 176.12 176.12 178  BMI 29.62 28.8 28.29 29.09 29.31 29.31 29.62   No flowsheet data found.     Overweight Deteriorated. Patient re-educated about  the importance of commitment to a  minimum of 150 minutes of exercise per week.  The importance of healthy food choices with portion control discussed. Encouraged to start a food diary, count calories and to consider  joining a support group. Sample diet sheets offered. Goals set by the patient for the next several  months.   Weight /BMI 03/31/2015 09/30/2014 06/04/2014  WEIGHT 178 lb 173 lb 0.6 oz 170 lb  HEIGHT 5\' 5"  5\' 5"  5\' 5"   BMI 29.62 kg/m2 28.8 kg/m2 28.29 kg/m2    Current exercise per week 150  minutes.   Osteopenia weight bearing exercise daily and adequate intake of calcium and vit D. Rept in 3 years      Review of Systems     Objective:   Physical Exam        Assessment & Plan:

## 2015-03-31 NOTE — Patient Instructions (Addendum)
F/u end Feb, call if you need me before  Pls call and schedule  Mammogram past due, also colonoscopy is past due, call for referral aim to complete this year  Pls return for flu vaccine  Bones are slightly thinner, continue commitment to daily exercise and 1200 mg calcium Vit D, CBc, HIV and hep C when labs with Dr Dorris Fetch  Please work on good  health habits so that your health will improve. 1. Commitment to daily physical activity for 30 to 60  minutes, if you are able to do this.  2. Commitment to wise food choices. Aim for half of your  food intake to be vegetable and fruit, one quarter starchy foods, and one quarter protein. Try to eat on a regular schedule  3 meals per day, snacking between meals should be limited to vegetables or fruits or small portions of nuts. 64 ounces of water per day is generally recommended, unless you have specific health conditions, like heart failure or kidney failure where you will need to limit fluid intake.  3. Commitment to sufficient and a  good quality of physical and mental rest daily, generally between 6 to 8 hours per day.  WITH PERSISTANCE AND PERSEVERANCE, THE IMPOSSIBLE , BECOMES THE NORM!   Thanks for choosing [facility], we consider it a privelige to serve you.

## 2015-04-04 DIAGNOSIS — M8580 Other specified disorders of bone density and structure, unspecified site: Secondary | ICD-10-CM | POA: Insufficient documentation

## 2015-04-04 NOTE — Assessment & Plan Note (Signed)
weight bearing exercise daily and adequate intake of calcium and vit D. Rept in 3 years

## 2015-04-04 NOTE — Assessment & Plan Note (Addendum)
Patient educated about the importance of limiting  Carbohydrate intake , the need to commit to daily physical activity for a minimum of 30 minutes , and to commit weight loss. The fact that changes in all these areas will reduce or eliminate all together the development of diabetes is stressed.  Slight improvement   Diabetic Labs Latest Ref Rng 03/22/2012 12/02/2011 11/12/2010 11/10/2010 05/18/2010  HbA1c <5.7 % 5.9(H) 6.2(H) 5.7(H) 5.9(H) -  Chol 0 - 200 mg/dL - 180 186 - 195  HDL >39 mg/dL - 66 63 - 64  Calc LDL 0 - 99 mg/dL - 104(H) 112(H) - 122(H)  Triglycerides <150 mg/dL - 52 53 - 45  Creatinine 0.40 - 1.20 mg/dL - - 1.04 - -   BP/Weight 03/31/2015 09/30/2014 06/04/2014 05/28/2014 04/08/2014 11/20/2013 8/63/8177  Systolic BP 116 579 038 333 832 919 166  Diastolic BP 82 70 90 84 91 82 84  Wt. (Lbs) 178 173.04 170 174.8 176.12 176.12 178  BMI 29.62 28.8 28.29 29.09 29.31 29.31 29.62   No flowsheet data found.

## 2015-04-04 NOTE — Assessment & Plan Note (Signed)
Managed by endo, recent need for dose increase in medication which she is tolerating well

## 2015-04-04 NOTE — Assessment & Plan Note (Signed)
Deteriorated. Patient re-educated about  the importance of commitment to a  minimum of 150 minutes of exercise per week.  The importance of healthy food choices with portion control discussed. Encouraged to start a food diary, count calories and to consider  joining a support group. Sample diet sheets offered. Goals set by the patient for the next several months.   Weight /BMI 03/31/2015 09/30/2014 06/04/2014  WEIGHT 178 lb 173 lb 0.6 oz 170 lb  HEIGHT 5\' 5"  5\' 5"  5\' 5"   BMI 29.62 kg/m2 28.8 kg/m2 28.29 kg/m2    Current exercise per week 150  minutes.

## 2015-04-06 ENCOUNTER — Encounter (INDEPENDENT_AMBULATORY_CARE_PROVIDER_SITE_OTHER): Payer: Self-pay | Admitting: *Deleted

## 2015-04-14 ENCOUNTER — Ambulatory Visit (INDEPENDENT_AMBULATORY_CARE_PROVIDER_SITE_OTHER): Payer: 59 | Admitting: Orthopedic Surgery

## 2015-04-14 ENCOUNTER — Ambulatory Visit (INDEPENDENT_AMBULATORY_CARE_PROVIDER_SITE_OTHER): Payer: 59

## 2015-04-14 VITALS — BP 141/85 | Ht 65.0 in | Wt 178.0 lb

## 2015-04-14 DIAGNOSIS — Z23 Encounter for immunization: Secondary | ICD-10-CM | POA: Diagnosis not present

## 2015-04-14 DIAGNOSIS — M25562 Pain in left knee: Secondary | ICD-10-CM

## 2015-04-14 DIAGNOSIS — M1712 Unilateral primary osteoarthritis, left knee: Secondary | ICD-10-CM | POA: Diagnosis not present

## 2015-04-14 NOTE — Progress Notes (Signed)
Patient ID: DLYNN RANES, female   DOB: 04-08-1951, 64 y.o.   MRN: [id number]  Follow up visit  Chief Complaint  Patient presents with  . Follow-up    1 year follow up + xray left knee arthritis    BP 141/85 mmHg  Ht 5\' 5"  (1.651 m)  Wt 178 lb (80.74 kg)  BMI 29.62 kg/m2  Encounter Diagnoses  Name Primary?  . Left knee pain   . 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

## 2015-04-21 ENCOUNTER — Other Ambulatory Visit: Payer: Self-pay | Admitting: Family Medicine

## 2015-04-21 DIAGNOSIS — Z1231 Encounter for screening mammogram for malignant neoplasm of breast: Secondary | ICD-10-CM

## 2015-04-29 ENCOUNTER — Ambulatory Visit (HOSPITAL_COMMUNITY)
Admission: RE | Admit: 2015-04-29 | Discharge: 2015-04-29 | Disposition: A | Discharge status: Home / Self Care | Payer: 59 | Source: Ambulatory Visit | Attending: Family Medicine | Admitting: Family Medicine

## 2015-04-29 DIAGNOSIS — Z1231 Encounter for screening mammogram for malignant neoplasm of breast: Secondary | ICD-10-CM | POA: Insufficient documentation

## 2015-05-05 ENCOUNTER — Ambulatory Visit (INDEPENDENT_AMBULATORY_CARE_PROVIDER_SITE_OTHER): Payer: 59 | Admitting: Family Medicine

## 2015-05-05 ENCOUNTER — Encounter: Payer: Self-pay | Admitting: Family Medicine

## 2015-05-05 VITALS — BP 104/78 | HR 72 | Resp 18 | Ht 65.0 in | Wt 175.0 lb

## 2015-05-05 DIAGNOSIS — E663 Overweight: Secondary | ICD-10-CM

## 2015-05-05 DIAGNOSIS — E038 Other specified hypothyroidism: Secondary | ICD-10-CM

## 2015-05-05 DIAGNOSIS — S8011XA Contusion of right lower leg, initial encounter: Secondary | ICD-10-CM | POA: Diagnosis not present

## 2015-05-05 NOTE — Patient Instructions (Addendum)
F/u as before  Mammogram reported as normal  Nodule on right calf represents resolving bruise/ hematoma under the skin where the ice bucket directly hit you 3 months ago, no risk of this going to lungs, and it should eventually totally be gone by next April  Letter today per request  Regular exercise with weight loss will help to reduce rate of development of arthritis

## 2015-05-05 NOTE — Progress Notes (Signed)
   Subjective:    Patient ID: Julie May, female    DOB: October 01, 1950, 64 y.o.   MRN: [id number]  HPI Pt in with concern of "knot" on right leg , 3 weeks after direct trauma from a falling ice bucket. Reports gradual improvement , but concerned that the area is not yet back to normal. Wishes also to review mammogram reports  Is still committed to healthy lifestyle with recent weight loss success    Review of Systems See HPI Denies recent fever or chills. Denies sinus pressure, nasal congestion, ear pain or sore throat. Denies chest congestion, productive cough or wheezing. Denies chest pains, palpitations and leg swelling Denies abdominal pain, nausea, vomiting,diarrhea or constipation.   Denies dysuria, frequency, hesitancy or incontinence. Denies joint pain, swelling and limitation in mobility. Denies headaches, seizures, numbness, or tingling. Denies depression, anxiety or insomnia.       Objective:   Physical Exam  BP 104/78 mmHg  Pulse 72  Resp 18  Ht 5\' 5"  (1.651 m)  Wt 175 lb 0.6 oz (79.398 kg)  BMI 29.13 kg/m2  SpO2 96% Patient alert and oriented and in no cardiopulmonary distress.  HEENT: No facial asymmetry, EOMI,   oropharynx pink and moist.  Neck supple no JVD, no mass.  Chest: Clear to auscultation bilaterally.  CVS: S1, S2 no murmurs, no S3.Regular rate.  ABD: Soft non tender.   Ext: No edema  MS: Adequate ROM spine, shoulders, hips and knees.  Skin: Intact, no ulcerations or rash noted.Palpable hematoma on right leg, non tender, max diameter approx 4 cm, no erythema or warmth  Psych: Good eye contact, normal affect. Memory intact not anxious or depressed appearing.  CNS: CN 2-12 intact, power,  normal throughout.no focal deficits noted.       Assessment & Plan:  Hematoma of leg H/o direct trauma 3 weeks prior to visit, bruising of skin is resolved, palpable hematoma present in area of pt concern, no warmth or tenderness, and pt reports that  this is reducing in size. She is reassured that this has no potential of embolizing to lung, and needs to give it an additional 6 to 12 weeks to fully resolve  Hypothyroidism Controlled and managed by endo  Overweight Improved. Patient re-educated about  the importance of commitment to a  minimum of 150 minutes of exercise per week.  The importance of healthy food choices with portion control discussed. Encouraged to start a food diary, count calories and to consider  joining a support group. Sample diet sheets offered. Goals set by the patient for the next several months.   Weight /BMI 05/05/2015 04/14/2015 03/31/2015  WEIGHT 175 lb 0.6 oz 178 lb 178 lb  HEIGHT 5\' 5"  5\' 5"  5\' 5"   BMI 29.13 kg/m2 29.62 kg/m2 29.62 kg/m2    Current exercise per week 150 minutes.

## 2015-05-22 DIAGNOSIS — S8010XA Contusion of unspecified lower leg, initial encounter: Secondary | ICD-10-CM | POA: Insufficient documentation

## 2015-05-22 NOTE — Assessment & Plan Note (Signed)
Controlled and managed by endo 

## 2015-05-22 NOTE — Assessment & Plan Note (Signed)
Improved. Patient re-educated about  the importance of commitment to a  minimum of 150 minutes of exercise per week.  The importance of healthy food choices with portion control discussed. Encouraged to start a food diary, count calories and to consider  joining a support group. Sample diet sheets offered. Goals set by the patient for the next several months.   Weight /BMI 05/05/2015 04/14/2015 03/31/2015  WEIGHT 175 lb 0.6 oz 178 lb 178 lb  HEIGHT 5\' 5"  5\' 5"  5\' 5"   BMI 29.13 kg/m2 29.62 kg/m2 29.62 kg/m2    Current exercise per week 150 minutes.

## 2015-05-22 NOTE — Assessment & Plan Note (Signed)
H/o direct trauma 3 weeks prior to visit, bruising of skin is resolved, palpable hematoma present in area of pt concern, no warmth or tenderness, and pt reports that this is reducing in size. She is reassured that this has no potential of embolizing to lung, and needs to give it an additional 6 to 12 weeks to fully resolve

## 2015-07-28 DIAGNOSIS — M25572 Pain in left ankle and joints of left foot: Secondary | ICD-10-CM | POA: Diagnosis not present

## 2015-07-28 DIAGNOSIS — M25571 Pain in right ankle and joints of right foot: Secondary | ICD-10-CM | POA: Diagnosis not present

## 2015-09-14 MED FILL — TIROSINT 75 MCG CAPSULE: 75 | 84 days supply | Qty: 84 | Fill #2

## 2015-09-18 ENCOUNTER — Other Ambulatory Visit: Payer: Self-pay | Admitting: "Endocrinology

## 2015-09-18 ENCOUNTER — Telehealth: Payer: Self-pay

## 2015-09-18 ENCOUNTER — Encounter (HOSPITAL_COMMUNITY): Payer: Self-pay | Admitting: Emergency Medicine

## 2015-09-18 ENCOUNTER — Emergency Department (HOSPITAL_COMMUNITY)
Admission: EM | Admit: 2015-09-18 | Discharge: 2015-09-18 | Disposition: A | Discharge status: Home / Self Care | Payer: 59 | Attending: Emergency Medicine | Admitting: Emergency Medicine

## 2015-09-18 DIAGNOSIS — R55 Syncope and collapse: Secondary | ICD-10-CM | POA: Diagnosis not present

## 2015-09-18 DIAGNOSIS — I10 Essential (primary) hypertension: Secondary | ICD-10-CM | POA: Diagnosis not present

## 2015-09-18 DIAGNOSIS — R030 Elevated blood-pressure reading, without diagnosis of hypertension: Secondary | ICD-10-CM | POA: Diagnosis not present

## 2015-09-18 DIAGNOSIS — E785 Hyperlipidemia, unspecified: Secondary | ICD-10-CM | POA: Diagnosis not present

## 2015-09-18 DIAGNOSIS — E038 Other specified hypothyroidism: Secondary | ICD-10-CM | POA: Diagnosis not present

## 2015-09-18 DIAGNOSIS — R7302 Impaired glucose tolerance (oral): Secondary | ICD-10-CM | POA: Diagnosis not present

## 2015-09-18 DIAGNOSIS — Z79899 Other long term (current) drug therapy: Secondary | ICD-10-CM | POA: Insufficient documentation

## 2015-09-18 DIAGNOSIS — M1990 Unspecified osteoarthritis, unspecified site: Secondary | ICD-10-CM | POA: Insufficient documentation

## 2015-09-18 DIAGNOSIS — M545 Low back pain, unspecified: Secondary | ICD-10-CM

## 2015-09-18 DIAGNOSIS — Z1159 Encounter for screening for other viral diseases: Secondary | ICD-10-CM | POA: Diagnosis not present

## 2015-09-18 DIAGNOSIS — E559 Vitamin D deficiency, unspecified: Secondary | ICD-10-CM | POA: Diagnosis not present

## 2015-09-18 MED ORDER — ACETAMINOPHEN 325 MG PO TABS
650.0000 mg | ORAL_TABLET | Freq: Once | ORAL | Status: AC
  Administered 2015-09-18: 650 mg via ORAL
  Filled 2015-09-18: qty 2

## 2015-09-18 NOTE — ED Notes (Signed)
Patient ambulated without assistance.  Patient did state that she had a "left flank pain" which is new.  Patient was noticeable while ambulating.

## 2015-09-18 NOTE — ED Provider Notes (Signed)
CSN: [id number]     Arrival date & time 09/18/15  1122 History  By signing my name below, I, Terressa Koyanagi, attest that this documentation has been prepared under the direction and in the presence of No att. providers found. Electronically Signed: Terressa Koyanagi, ED Scribe. 09/18/2015. 12:20 PM.   Chief Complaint  Patient presents with  . Loss of Consciousness   HPI  PCP: Tula Nakayama, MD HPI Comments: Julie May is a 65 y.o. female, with PMHx noted below including HTN, who presents to the Emergency Department complaining of one episode of syncope onset at 9 am today while pt was having her blood drawn this morning.  Associated Sx include left sided spasmic hip pain and nausea. Pt denies any Hx of chronic cardiac problems. Pt further denies dizziness, dysuria, difficulty urinating, diarrhea, vomiting or any other Sx at this time.   Past Medical History  Diagnosis Date  . Overweight(278.02)   . Hyperpotassemia   . Other abnormal glucose   . Polycythemia, secondary   . Routine general medical examination at a health care facility   . Trigger finger (acquired)   . Unspecified disorder of thyroid   . OA (osteoarthritis)   . Hyperlipidemia   . Hypertension     new dx 11/12/2010   Past Surgical History  Procedure Laterality Date  . Tonsilectomy, adenoidectomy, bilateral myringotomy and tubes    . Tonsillectomy    . Uterine polyp removal      Family History  Problem Relation Age of Onset  . Heart attack Father   . Stroke Mother   . Hypertension Mother   . Thyroid disease Sister   . Hyperlipidemia Brother   . Heart attack Brother   . Heart disease Brother   . Arthritis      family history   . Hypertension      family history   . Stroke      family history   . Heart disease      family history    Social History  Substance Use Topics  . Smoking status: Never Smoker   . Smokeless tobacco: Never Used  . Alcohol Use: Yes     Comment: occ.   OB History    No data  available     Review of Systems  Gastrointestinal: Positive for nausea. Negative for vomiting and diarrhea.  Genitourinary: Negative for dysuria and difficulty urinating.  Musculoskeletal: Positive for arthralgias (left hip).  Neurological: Positive for syncope. Negative for dizziness.  All other systems reviewed and are negative.  Allergies  Aspirin  Home Medications   Prior to Admission medications   Medication Sig Start Date End Date Taking? Authorizing Provider  Levothyroxine Sodium (TIROSINT) 75 MCG CAPS Take by mouth daily before breakfast.    Historical Provider, MD   Triage Vitals: BP 152/68 mmHg  Pulse 60  Resp 16  Ht 5\' 5"  (1.651 m)  Wt 175 lb (79.379 kg)  BMI 29.12 kg/m2  SpO2 100% Physical Exam  Constitutional: She is oriented to person, place, and time. She appears well-developed and well-nourished.  HENT:  Head: Normocephalic.  Eyes: EOM are normal.  No nystagmus  Neck: Normal range of motion.  Pulmonary/Chest: Effort normal.  Abdominal: She exhibits no distension. There is no tenderness.  Musculoskeletal: Normal range of motion.  Slight tenderness to left SI joint. No CVA tenderness   Neurological: She is alert and oriented to person, place, and time.  Psychiatric: She has a normal mood and affect.  Nursing note and vitals reviewed.   ED Course  Procedures (including critical care time) DIAGNOSTIC STUDIES: Oxygen Saturation is 100% on ra, nl by my interpretation.    COORDINATION OF CARE: 12:15 PM: Discussed treatment plan which includes EKG results with pt at bedside; patient verbalizes understanding and agrees with treatment plan.    EKG Interpretation   Date/Time:  Friday September 18 2015 11:52:11 EST Ventricular Rate:  55 PR Interval:  181 QRS Duration: 95 QT Interval:  458 QTC Calculation: 438 R Axis:   63 Text Interpretation:  Sinus rhythm Probable left atrial enlargement No  significant change since last tracing Confirmed by Collette Pescador   MD, Ovid Curd  [phone]) on 09/18/2015 12:04:17 PM      MDM   Final diagnoses:  Vasovagal syncope  Left-sided low back pain without sciatica    Patient with low back pain. Likely muscle skeletal. Patient did come in with vasovagal syncope. Had a syncopal episode after had a blood drawn. Ambulated swell besides the pain. Will discharge home. I personally performed the services described in this documentation, which was scribed in my presence. The recorded information has been reviewed and is accurate.       Davonna Belling, MD 09/18/15 2209

## 2015-09-18 NOTE — Telephone Encounter (Signed)
Tech at [facility] called and said that during lab draw, patient passed out but immediately came back to and her husband came to pick her up and brought her some food but she wanted to still see the dr. Durward Fortes Dr wasn't in office today and if she needed to be evaluated to go to the urgent care or ER and call for appt Monday for follow up

## 2015-09-18 NOTE — Discharge Instructions (Signed)
Back Pain, Adult °Back pain is very common in adults. The cause of back pain is rarely dangerous and the pain often gets better over time. The cause of your back pain may not be known. Some common causes of back pain include: °· Strain of the muscles or ligaments supporting the spine. °· Wear and tear (degeneration) of the spinal disks. °· Arthritis. °· Direct injury to the back. °For many people, back pain may return. Since back pain is rarely dangerous, most people can learn to manage this condition on their own. °HOME CARE INSTRUCTIONS °Watch your back pain for any changes. The following actions may help to lessen any discomfort you are feeling: °· Remain active. It is stressful on your back to sit or stand in one place for long periods of time. Do not sit, drive, or stand in one place for more than 30 minutes at a time. Take short walks on even surfaces as soon as you are able. Try to increase the length of time you walk each day. °· Exercise regularly as directed by your health care provider. Exercise helps your back heal faster. It also helps avoid future injury by keeping your muscles strong and flexible. °· Do not stay in bed. Resting more than 1-2 days can delay your recovery. °· Pay attention to your body when you bend and lift. The most comfortable positions are those that put less stress on your recovering back. Always use proper lifting techniques, including: °· Bending your knees. °· Keeping the load close to your body. °· Avoiding twisting. °· Find a comfortable position to sleep. Use a firm mattress and lie on your side with your knees slightly bent. If you lie on your back, put a pillow under your knees. °· Avoid feeling anxious or stressed. Stress increases muscle tension and can worsen back pain. It is important to recognize when you are anxious or stressed and learn ways to manage it, such as with exercise. °· Take medicines only as directed by your health care provider. Over-the-counter  medicines to reduce pain and inflammation are often the most helpful. Your health care provider may prescribe muscle relaxant drugs. These medicines help dull your pain so you can more quickly return to your normal activities and healthy exercise. °· Apply ice to the injured area: °· Put ice in a plastic bag. °· Place a towel between your skin and the bag. °· Leave the ice on for 20 minutes, 2-3 times a day for the first 2-3 days. After that, ice and heat may be alternated to reduce pain and spasms. °· Maintain a healthy weight. Excess weight puts extra stress on your back and makes it difficult to maintain good posture. °SEEK MEDICAL CARE IF: °· You have pain that is not relieved with rest or medicine. °· You have increasing pain going down into the legs or buttocks. °· You have pain that does not improve in one week. °· You have night pain. °· You lose weight. °· You have a fever or chills. °SEEK IMMEDIATE MEDICAL CARE IF:  °· You develop new bowel or bladder control problems. °· You have unusual weakness or numbness in your arms or legs. °· You develop nausea or vomiting. °· You develop abdominal pain. °· You feel faint. °  °This information is not intended to replace advice given to you by your health care provider. Make sure you discuss any questions you have with your health care provider. °  °Document Released: 06/27/2005 Document Revised: 07/18/2014 Document Reviewed: 10/29/2013 °Elsevier Interactive Patient Education ©2016 Elsevier   Inc.  Syncope, commonly known as fainting, is a temporary loss of consciousness. It occurs when the blood flow to the brain is reduced. Vasovagal syncope (also called neurocardiogenic syncope) is a fainting spell in which the blood flow to the brain is reduced because of a sudden drop in heart rate and blood pressure. Vasovagal syncope occurs when the brain and the cardiovascular system (blood vessels) do not adequately communicate and respond to each other. This is the most  common cause of fainting. It often occurs in response to fear or some other type of emotional or physical stress. The body has a reaction in which the heart starts beating too slowly or the blood vessels expand, reducing blood pressure. This type of fainting spell is generally considered harmless. However, injuries can occur if a person takes a sudden fall during a fainting spell.  CAUSES  Vasovagal syncope occurs when a person's blood pressure and heart rate decrease suddenly, usually in response to a trigger. Many things and situations can trigger an episode. Some of these include:   Pain.   Fear.   The sight of blood or medical procedures, such as blood being drawn from a vein.   Common activities, such as coughing, swallowing, stretching, or going to the bathroom.   Emotional stress.   Prolonged standing, especially in a warm environment.   Lack of sleep or rest.   Prolonged lack of food.   Prolonged lack of fluids.   Recent illness.  The use of certain drugs that affect blood pressure, such as cocaine, alcohol, marijuana, inhalants, and opiates.  SYMPTOMS  Before the fainting episode, you may:   Feel dizzy or light headed.   Become pale.  Sense that you are going to faint.   Feel like the room is spinning.   Have tunnel vision, only seeing directly in front of you.   Feel sick to your stomach (nauseous).   See spots or slowly lose vision.   Hear ringing in your ears.   Have a headache.   Feel warm and sweaty.   Feel a sensation of pins and needles. During the fainting spell, you will generally be unconscious for no longer than a couple minutes before waking up and returning to normal. If you get up too quickly before your body can recover, you may faint again. Some twitching or jerky movements may occur during the fainting spell.  DIAGNOSIS  Your health care provider will ask about your symptoms, take a medical history, and perform a physical  exam. Various tests may be done to rule out other causes of fainting. These may include blood tests and tests to check the heart, such as electrocardiography, echocardiography, and possibly an electrophysiology study. When other causes have been ruled out, a test may be done to check the body's response to changes in position (tilt table test). TREATMENT  Most cases of vasovagal syncope do not require treatment. Your health care provider may recommend ways to avoid fainting triggers and may provide home strategies for preventing fainting. If you must be exposed to a possible trigger, you can drink additional fluids to help reduce your chances of having an episode of vasovagal syncope. If you have warning signs of an oncoming episode, you can respond by positioning yourself favorably (lying down). If your fainting spells continue, you may be given medicines to prevent fainting. Some medicines may help make you more resistant to repeated episodes of vasovagal syncope. Special exercises or compression stockings may be recommended. In rare  cases, the surgical placement of a pacemaker is considered. HOME CARE INSTRUCTIONS   Learn to identify the warning signs of vasovagal syncope.   Sit or lie down at the first warning sign of a fainting spell. If sitting, put your head down between your legs. If you lie down, swing your legs up in the air to increase blood flow to the brain.   Avoid hot tubs and saunas.  Avoid prolonged standing.  Drink enough fluids to keep your urine clear or pale yellow. Avoid caffeine.  Increase salt in your diet as directed by your health care provider.   If you have to stand for a long time, perform movements such as:   Crossing your legs.   Flexing and stretching your leg muscles.   Squatting.   Moving your legs.   Bending over.   Only take over-the-counter or prescription medicines as directed by your health care provider. Do not suddenly stop any medicines  without asking your health care provider first. [place] IF:   Your fainting spells continue or happen more frequently in spite of treatment.   You lose consciousness for more than a couple minutes.  You have fainting spells during or after exercising or after being startled.   You have new symptoms that occur with the fainting spells, such as:   Shortness of breath.  Chest pain.   Irregular heartbeat.   You have episodes of twitching or jerky movements that last longer than a few seconds.  You have episodes of twitching or jerky movements without obvious fainting. SEEK IMMEDIATE MEDICAL CARE IF:   You have injuries or bleeding after a fainting spell.   You have episodes of twitching or jerky movements that last longer than 5 minutes.   You have more than one spell of twitching or jerky movements before returning to consciousness after fainting.   This information is not intended to replace advice given to you by your health care provider. Make sure you discuss any questions you have with your health care provider.   Document Released: 06/13/2012 Document Revised: 11/11/2014 Document Reviewed: 06/13/2012 Elsevier Interactive Patient Education [employer].

## 2015-09-18 NOTE — ED Notes (Signed)
Patient given discharge instruction, verbalized understand. Patient ambulatory out of the department with husband. 

## 2015-09-18 NOTE — ED Notes (Signed)
PT stated she was having blood draw this am and had a syncopal episode there around 0900. PT c/o generalized weakness this am with nausea as well. PT denies falling at that time.

## 2015-09-18 NOTE — ED Notes (Signed)
Pt states she feels better no dizziness, she does have some nausea, no appetite, chills

## 2015-09-19 LAB — HGB A1C W/O EAG: Hgb A1c MFr Bld: 5.6 % (ref 4.8–5.6)

## 2015-09-19 LAB — T4, FREE: Free T4: 1.62 ng/dL (ref 0.82–1.77)

## 2015-09-19 LAB — TSH: TSH: 0.793 u[IU]/mL (ref 0.450–4.500)

## 2015-09-23 ENCOUNTER — Telehealth: Payer: Self-pay | Admitting: Family Medicine

## 2015-09-23 NOTE — Telephone Encounter (Signed)
Direct contact made as Ed follow up for vasovagal episode following lab draw last week Reports back to normal, did "twist her back"  Improving with time and tylenol No additional meds or management indicated  At this time

## 2015-09-25 ENCOUNTER — Ambulatory Visit (INDEPENDENT_AMBULATORY_CARE_PROVIDER_SITE_OTHER): Payer: 59 | Admitting: "Endocrinology

## 2015-09-25 ENCOUNTER — Encounter: Payer: Self-pay | Admitting: "Endocrinology

## 2015-09-25 VITALS — BP 129/84 | HR 64 | Ht 65.0 in | Wt 177.0 lb

## 2015-09-25 DIAGNOSIS — E042 Nontoxic multinodular goiter: Secondary | ICD-10-CM | POA: Diagnosis not present

## 2015-09-25 DIAGNOSIS — E785 Hyperlipidemia, unspecified: Secondary | ICD-10-CM | POA: Diagnosis not present

## 2015-09-25 DIAGNOSIS — R7303 Prediabetes: Secondary | ICD-10-CM

## 2015-09-25 DIAGNOSIS — E038 Other specified hypothyroidism: Secondary | ICD-10-CM

## 2015-09-25 MED ORDER — LEVOTHYROXINE SODIUM 75 MCG PO CAPS: 75.0000 ug | ORAL_CAPSULE | Freq: Every day | ORAL | Status: DC

## 2015-09-25 NOTE — Progress Notes (Signed)
Subjective:    Patient ID: Julie May, female    DOB: 11-25-1950, PCP Tula Nakayama, MD   Past Medical History  Diagnosis Date  . Overweight(278.02)   . Hyperpotassemia   . Other abnormal glucose   . Polycythemia, secondary   . Routine general medical examination at a health care facility   . Trigger finger (acquired)   . Unspecified disorder of thyroid   . OA (osteoarthritis)   . Hyperlipidemia   . Hypertension     new dx 11/12/2010   Past Surgical History  Procedure Laterality Date  . Tonsilectomy, adenoidectomy, bilateral myringotomy and tubes    . Tonsillectomy    . Uterine polyp removal      Social History   Social History  . Marital Status: Married    Spouse Name: N/A  . Number of Children: 0  . Years of Education: N/A   Occupational History  . book Therapist, nutritional     Social History Main Topics  . Smoking status: Never Smoker   . Smokeless tobacco: Never Used  . Alcohol Use: Yes     Comment: occ.  . Drug Use: No  . Sexual Activity: Yes    Birth Control/ Protection: Post-menopausal   Other Topics Concern  . None   Social History Narrative   Outpatient Encounter Prescriptions as of 09/25/2015  Medication Sig  . Levothyroxine Sodium (TIROSINT) 75 MCG CAPS Take 1 capsule (75 mcg total) by mouth daily before breakfast.  . [DISCONTINUED] Levothyroxine Sodium (TIROSINT) 75 MCG CAPS Take by mouth daily before breakfast.   No facility-administered encounter medications on file as of 09/25/2015.   ALLERGIES: Allergies  Allergen Reactions  . Aspirin Other (See Comments)    bruising   VACCINATION STATUS: Immunization History  Administered Date(s) Administered  . Influenza Split 06/22/2011  . Influenza Whole 05/18/2006, 04/30/2007, 04/14/2008  . Influenza,inj,Quad PF,36+ Mos 05/31/2013, 05/28/2014, 04/14/2015  . Td 08/13/2009  . Zoster 03/23/2011    HPI  65 yr old female with medical hx of hypothyroidism x 5 yrs on Tirosint. She is here for f/u. she  was kept on Tirosint 75 mcg po qday, because she did not tolerate synthroid and LT4. Her latest labs show  target TSH, normal free t4.  Pt denies any complaints. No interval issues. she  Has steady weight.   Review of Systems Constitutional: no weight gain/loss, no fatigue, no subjective hyperthermia/hypothermia Eyes: no blurry vision, no xerophthalmia ENT: no sore throat, no nodules palpated in throat, no dysphagia/odynophagia, no hoarseness Cardiovascular: no CP/SOB/palpitations/leg swelling Respiratory: no cough/SOB Gastrointestinal: no N/V/D/C Musculoskeletal: no muscle/joint aches Skin: no rashes Neurological: no tremors/numbness/tingling/dizziness Psychiatric: no depression/anxiety  Objective:    BP 129/84 mmHg  Pulse 64  Ht _0  (1.651 m)  Wt 177 lb (80.287 kg)  BMI 29.45 kg/m2  SpO2 97%  Wt Readings from Last 3 Encounters:  09/25/15 177 lb (80.287 kg)  09/18/15 175 lb (79.379 kg)  05/05/15 175 lb 0.6 oz (79.398 kg)    Physical Exam Constitutional: overweight, in NAD Eyes: PERRLA, EOMI, no exophthalmos ENT: moist mucous membranes, palpable thyroid, no cervical lymphadenopathy Cardiovascular: RRR, No MRG Respiratory: CTA B Gastrointestinal: abdomen soft, NT, ND, BS+ Musculoskeletal: bilateral feet deformities, strength intact in all 4 Skin: moist, warm, no rashes Neurological: no tremor with outstretched hands, DTR normal in all 4  CMP     Component Value Date/Time   NA 139 11/12/2010 0904   K 5.8* 11/12/2010 0904   CL 104  11/12/2010 0904   CO2 24 11/12/2010 0904   GLUCOSE 97 11/12/2010 0904   BUN 18 11/12/2010 0904   CREATININE 1.04 11/12/2010 0904   CREATININE 0.81 03/11/2010 0844   CALCIUM 9.7 11/12/2010 0904   PROT 6.3 03/11/2010 0844   ALBUMIN 3.7 03/11/2010 0844   AST 30 03/11/2010 0844   ALT 35 03/11/2010 0844   ALKPHOS 103 03/11/2010 0844   BILITOT 0.8 03/11/2010 0844   GFRNONAA >60 03/11/2010 0844   GFRAA  03/11/2010 0844    >60        The  eGFR has been calculated using the MDRD equation. This calculation has not been validated in all clinical situations. eGFR's persistently <60 mL/min signify possible Chronic Kidney Disease.     Diabetic Labs (most recent): Lab Results  Component Value Date   HGBA1C 5.6 09/18/2015   HGBA1C 5.9* 03/22/2012   HGBA1C 6.2* 12/02/2011      Lipid Panel     Component Value Date/Time   CHOL 180 12/02/2011 0800   TRIG 52 12/02/2011 0800   HDL 66 12/02/2011 0800   CHOLHDL 2.7 12/02/2011 0800   VLDL 10 12/02/2011 0800   LDLCALC 104* 12/02/2011 0800     Results for DONIA, YOKUM (MRN [id number]) as of 09/25/2015 08:58  Ref. Range 09/18/2015 09:36  Hemoglobin A1C Latest Ref Range: 4.8-5.6 % 5.6  TSH Latest Ref Range: 0.450-4.500 uIU/mL 0.793  T4,Free(Direct) Latest Ref Range: 0.82-1.77 ng/dL 1.62       Assessment & Plan:   1. Other specified hypothyroidism Her TFTs show Appropriate -replacement with Tirosint. I will continue Tirosint  75 mcg po qam. She is clinically improving.  - We discussed about correct intake of levothyroxine, at fasting, with water, separated by at least 30 minutes from breakfast, and separated by more than 4 hours from calcium, iron, multivitamins, acid reflux medications (PPIs). -Patient is made aware of the fact that thyroid hormone replacement is needed for life, dose to be adjusted by periodic monitoring of thyroid function tests.   2. Hyperlipemia Her last lipid panel were better  . Will not need medications , I advised her on diet and exercise.   3. Prediabetes -She was diagnosed with prediabetes last visit, a1c  improved to 5.6%. Detailed information on diabetes and complications given to her. she will not need medications for prediabetes now. I advised her to avoid simple CHO.  4. Nontoxic multinodular goiter -Her last ultrasound from 2012 showed bilateral multiple small thyroid nodules. I will repeat surveillance ultrasound before her next  visit in 6 months.   - I advised patient to maintain close follow up with Tula Nakayama, MD for primary care needs. Follow up plan: Return in about 6 months (around 03/27/2016) for underactive thyroid, prediabetes, follow up with pre-visit labs.  Glade Lloyd, MD Phone: [phone]  Fax: [phone]   09/25/2015, 9:14 AM

## 2015-09-29 DIAGNOSIS — M25572 Pain in left ankle and joints of left foot: Secondary | ICD-10-CM | POA: Diagnosis not present

## 2015-09-29 DIAGNOSIS — M25571 Pain in right ankle and joints of right foot: Secondary | ICD-10-CM | POA: Diagnosis not present

## 2015-10-02 ENCOUNTER — Encounter: Payer: Self-pay | Admitting: Family Medicine

## 2015-12-03 ENCOUNTER — Encounter: Payer: Self-pay | Admitting: "Endocrinology

## 2015-12-08 ENCOUNTER — Other Ambulatory Visit: Payer: Self-pay

## 2015-12-08 MED ORDER — LEVOTHYROXINE SODIUM 75 MCG PO CAPS: 75.0000 ug | ORAL_CAPSULE | Freq: Every day | ORAL | Status: DC

## 2015-12-08 MED FILL — TIROSINT 75 MCG CAPSULE: 75 | 84 days supply | Qty: 84 | Fill #0

## 2015-12-28 ENCOUNTER — Other Ambulatory Visit (INDEPENDENT_AMBULATORY_CARE_PROVIDER_SITE_OTHER): Payer: Self-pay | Admitting: *Deleted

## 2015-12-28 DIAGNOSIS — Z1211 Encounter for screening for malignant neoplasm of colon: Secondary | ICD-10-CM

## 2016-01-21 ENCOUNTER — Telehealth: Payer: Self-pay

## 2016-01-21 DIAGNOSIS — Z1283 Encounter for screening for malignant neoplasm of skin: Secondary | ICD-10-CM

## 2016-01-21 NOTE — Telephone Encounter (Signed)
Referral entered  

## 2016-02-01 DIAGNOSIS — L570 Actinic keratosis: Secondary | ICD-10-CM | POA: Diagnosis not present

## 2016-02-01 DIAGNOSIS — D225 Melanocytic nevi of trunk: Secondary | ICD-10-CM | POA: Diagnosis not present

## 2016-02-01 DIAGNOSIS — X32XXXD Exposure to sunlight, subsequent encounter: Secondary | ICD-10-CM | POA: Diagnosis not present

## 2016-02-01 DIAGNOSIS — Z1283 Encounter for screening for malignant neoplasm of skin: Secondary | ICD-10-CM | POA: Diagnosis not present

## 2016-02-05 ENCOUNTER — Telehealth (INDEPENDENT_AMBULATORY_CARE_PROVIDER_SITE_OTHER): Payer: Self-pay | Admitting: *Deleted

## 2016-02-05 ENCOUNTER — Encounter (INDEPENDENT_AMBULATORY_CARE_PROVIDER_SITE_OTHER): Payer: Self-pay | Admitting: *Deleted

## 2016-02-05 NOTE — Telephone Encounter (Signed)
Patient needs trilyte 

## 2016-02-09 MED ORDER — PEG 3350-KCL-NA BICARB-NACL 420 G PO SOLR: 4000.0000 mL | Freq: Once | ORAL | 0 refills | Status: AC

## 2016-02-09 MED FILL — GAVILYTE-N SOLUTION: 420 | 1 days supply | Qty: 4000 | Fill #0

## 2016-02-15 ENCOUNTER — Encounter (INDEPENDENT_AMBULATORY_CARE_PROVIDER_SITE_OTHER): Payer: Self-pay | Admitting: *Deleted

## 2016-02-15 ENCOUNTER — Telehealth (INDEPENDENT_AMBULATORY_CARE_PROVIDER_SITE_OTHER): Payer: Self-pay | Admitting: *Deleted

## 2016-02-15 NOTE — Telephone Encounter (Signed)
Patient needs movi prep 

## 2016-02-16 MED ORDER — PEG-KCL-NACL-NASULF-NA ASC-C 100 G PO SOLR: 1.0000 | Freq: Once | ORAL | 0 refills | Status: AC

## 2016-02-29 ENCOUNTER — Telehealth (INDEPENDENT_AMBULATORY_CARE_PROVIDER_SITE_OTHER): Payer: Self-pay | Admitting: *Deleted

## 2016-02-29 ENCOUNTER — Other Ambulatory Visit: Payer: Self-pay

## 2016-02-29 MED ORDER — LEVOTHYROXINE SODIUM 75 MCG PO CAPS: 75.0000 ug | ORAL_CAPSULE | Freq: Every day | ORAL | 1 refills | Status: DC

## 2016-02-29 MED FILL — TIROSINT 75 MCG CAPSULE: 75 | 84 days supply | Qty: 84 | Fill #0

## 2016-02-29 NOTE — Telephone Encounter (Signed)
agree

## 2016-02-29 NOTE — Telephone Encounter (Signed)
Referring MD/PCP: simpson   Procedure: tcs  Reason/Indication:  screening  Has patient had this procedure before?  Yes, over 10 yrs ago  If so, when, by whom and where?    Is there a family history of colon cancer?  no  Who?  What age when diagnosed?    Is patient diabetic?   no      Does patient have prosthetic heart valve or mechanical valve?  no  Do you have a pacemaker?  no  Has patient ever had endocarditis? no  Has patient had joint replacement within last 12 months?  no  Does patient tend to be constipated or take laxatives? no  Does patient have a history of alcohol/drug use?  no  Is patient on Coumadin, Plavix and/or Aspirin? no  Medications: levothyroxine 75 mcg daily  Allergies: asa  Medication Adjustment:   Procedure date & time: 03/30/16 at 1030

## 2016-03-21 MED FILL — MOVIPREP POWDER KIT: 100 | 1 days supply | Qty: 1 | Fill #0

## 2016-03-25 ENCOUNTER — Other Ambulatory Visit (HOSPITAL_COMMUNITY)
Admission: RE | Admit: 2016-03-25 | Discharge: 2016-03-25 | Disposition: A | Discharge status: Home / Self Care | Payer: 59 | Source: Ambulatory Visit | Attending: "Endocrinology | Admitting: "Endocrinology

## 2016-03-25 DIAGNOSIS — E785 Hyperlipidemia, unspecified: Secondary | ICD-10-CM | POA: Diagnosis not present

## 2016-03-25 DIAGNOSIS — E038 Other specified hypothyroidism: Secondary | ICD-10-CM | POA: Insufficient documentation

## 2016-03-25 LAB — T4, FREE: Free T4: 1.15 ng/dL — ABNORMAL HIGH (ref 0.61–1.12)

## 2016-03-25 LAB — LIPID PANEL
CHOL/HDL RATIO: 2.8 ratio
CHOLESTEROL: 203 mg/dL — AB (ref 0–200)
HDL: 72 mg/dL (ref >40)
LDL Cholesterol: 124 mg/dL — ABNORMAL HIGH (ref 0–99)
TRIGLYCERIDES: 34 mg/dL (ref <150)
VLDL: 7 mg/dL (ref 0–40)

## 2016-03-25 LAB — TSH: TSH: 5.286 u[IU]/mL — AB (ref 0.350–4.500)

## 2016-03-28 ENCOUNTER — Ambulatory Visit (HOSPITAL_COMMUNITY)
Admission: RE | Admit: 2016-03-28 | Discharge: 2016-03-28 | Disposition: A | Discharge status: Home / Self Care | Payer: 59 | Source: Ambulatory Visit | Attending: "Endocrinology | Admitting: "Endocrinology

## 2016-03-28 DIAGNOSIS — E042 Nontoxic multinodular goiter: Secondary | ICD-10-CM

## 2016-03-30 ENCOUNTER — Ambulatory Visit (HOSPITAL_COMMUNITY)
Admission: RE | Admit: 2016-03-30 | Discharge: 2016-03-30 | Disposition: A | Discharge status: Home / Self Care | Payer: 59 | Source: Ambulatory Visit | Attending: Internal Medicine | Admitting: Internal Medicine

## 2016-03-30 ENCOUNTER — Encounter (HOSPITAL_COMMUNITY)
Admission: RE | Disposition: A | Discharge status: Home / Self Care | Payer: Self-pay | Source: Ambulatory Visit | Attending: Internal Medicine

## 2016-03-30 ENCOUNTER — Encounter (HOSPITAL_COMMUNITY): Payer: Self-pay | Admitting: *Deleted

## 2016-03-30 DIAGNOSIS — Z6827 Body mass index (BMI) 27.0-27.9, adult: Secondary | ICD-10-CM | POA: Diagnosis not present

## 2016-03-30 DIAGNOSIS — Z79899 Other long term (current) drug therapy: Secondary | ICD-10-CM | POA: Insufficient documentation

## 2016-03-30 DIAGNOSIS — I10 Essential (primary) hypertension: Secondary | ICD-10-CM | POA: Diagnosis not present

## 2016-03-30 DIAGNOSIS — Z1211 Encounter for screening for malignant neoplasm of colon: Secondary | ICD-10-CM | POA: Diagnosis not present

## 2016-03-30 DIAGNOSIS — E079 Disorder of thyroid, unspecified: Secondary | ICD-10-CM | POA: Diagnosis not present

## 2016-03-30 DIAGNOSIS — E663 Overweight: Secondary | ICD-10-CM | POA: Diagnosis not present

## 2016-03-30 DIAGNOSIS — D751 Secondary polycythemia: Secondary | ICD-10-CM | POA: Diagnosis not present

## 2016-03-30 DIAGNOSIS — K5730 Diverticulosis of large intestine without perforation or abscess without bleeding: Secondary | ICD-10-CM | POA: Insufficient documentation

## 2016-03-30 HISTORY — PX: COLONOSCOPY: SHX5424

## 2016-03-30 SURGERY — COLONOSCOPY
Anesthesia: Moderate Sedation

## 2016-03-30 MED ORDER — STERILE WATER FOR IRRIGATION IR SOLN
Status: DC | PRN
  Administered 2016-03-30: 100 mL

## 2016-03-30 MED ORDER — SODIUM CHLORIDE 0.9 % IV SOLN
INTRAVENOUS | Status: DC
  Administered 2016-03-30: 1000 mL via INTRAVENOUS

## 2016-03-30 MED ORDER — MEPERIDINE HCL 50 MG/ML IJ SOLN
INTRAMUSCULAR | Status: DC | PRN
  Administered 2016-03-30 (×2): 25 mg via INTRAVENOUS

## 2016-03-30 MED ORDER — MIDAZOLAM HCL 5 MG/5ML IJ SOLN
INTRAMUSCULAR | Status: DC | PRN
  Administered 2016-03-30: 2 mg via INTRAVENOUS
  Administered 2016-03-30 (×3): 1 mg via INTRAVENOUS
  Administered 2016-03-30: 2 mg via INTRAVENOUS

## 2016-03-30 MED ORDER — MEPERIDINE HCL 50 MG/ML IJ SOLN
INTRAMUSCULAR | Status: DC
Start: 2016-03-30 — End: 2016-03-30
  Filled 2016-03-30: qty 1

## 2016-03-30 MED ORDER — MIDAZOLAM HCL 5 MG/5ML IJ SOLN
INTRAMUSCULAR | Status: AC
  Filled 2016-03-30: qty 10

## 2016-03-30 NOTE — Discharge Instructions (Signed)
Resume usual medications and high fiber diet. No driving for 24 hours. Next screening exam in 10 years.  Colonoscopy, Care After Refer to this sheet in the next few weeks. These instructions provide you with information on caring for yourself after your procedure. Your health care provider may also give you more specific instructions. Your treatment has been planned according to current medical practices, but problems sometimes occur. Call your health care provider if you have any problems or questions after your procedure. WHAT TO EXPECT AFTER THE PROCEDURE  After your procedure, it is typical to have the following:  A small amount of blood in your stool.  Moderate amounts of gas and mild abdominal cramping or bloating. HOME CARE INSTRUCTIONS  Do not drive, operate machinery, or sign important documents for 24 hours.  You may shower and resume your regular physical activities, but move at a slower pace for the first 24 hours.  Take frequent rest periods for the first 24 hours.  Walk around or put a warm pack on your abdomen to help reduce abdominal cramping and bloating.  Drink enough fluids to keep your urine clear or pale yellow.  You may resume your normal diet as instructed by your health care provider. Avoid heavy or fried foods that are hard to digest.  Avoid drinking alcohol for 24 hours or as instructed by your health care provider.  Only take over-the-counter or prescription medicines as directed by your health care provider.  If a tissue sample (biopsy) was taken during your procedure:  Do not take aspirin or blood thinners for 7 days, or as instructed by your health care provider.  Do not drink alcohol for 7 days, or as instructed by your health care provider.  Eat soft foods for the first 24 hours. SEEK MEDICAL CARE IF: You have persistent spotting of blood in your stool 2-3 days after the procedure. SEEK IMMEDIATE MEDICAL CARE IF:  You have more than a small  spotting of blood in your stool.  You pass large blood clots in your stool.  Your abdomen is swollen (distended).  You have nausea or vomiting.  You have a fever.  You have increasing abdominal pain that is not relieved with medicine.   This information is not intended to replace advice given to you by your health care provider. Make sure you discuss any questions you have with your health care provider.   Document Released: 02/09/2004 Document Revised: 04/17/2013 Document Reviewed: 03/04/2013 Elsevier Interactive Patient Education 2016 Elsevier Inc. High-Fiber Diet Fiber, also called dietary fiber, is a type of carbohydrate found in fruits, vegetables, whole grains, and beans. A high-fiber diet can have many health benefits. Your health care provider may recommend a high-fiber diet to help:  Prevent constipation. Fiber can make your bowel movements more regular.  Lower your cholesterol.  Relieve hemorrhoids, uncomplicated diverticulosis, or irritable bowel syndrome.  Prevent overeating as part of a weight-loss plan.  Prevent heart disease, type 2 diabetes, and certain cancers. WHAT IS MY PLAN? The recommended daily intake of fiber includes:  38 grams for men under age 28.  35 grams for men over age 28.  7 grams for women under age 32.  49 grams for women over age 17. You can get the recommended daily intake of dietary fiber by eating a variety of fruits, vegetables, grains, and beans. Your health care provider may also recommend a fiber supplement if it is not possible to get enough fiber through your diet. WHAT DO I  NEED TO KNOW ABOUT A HIGH-FIBER DIET?  Fiber supplements have not been widely studied for their effectiveness, so it is better to get fiber through food sources.  Always check the fiber content on thenutrition facts label of any prepackaged food. Look for foods that contain at least 5 grams of fiber per serving.  Ask your dietitian if you have questions  about specific foods that are related to your condition, especially if those foods are not listed in the following section.  Increase your daily fiber consumption gradually. Increasing your intake of dietary fiber too quickly may cause bloating, cramping, or gas.  Drink plenty of water. Water helps you to digest fiber. WHAT FOODS CAN I EAT? Grains Whole-grain breads. Multigrain cereal. Oats and oatmeal. Brown rice. Barley. Bulgur wheat. [place]. Bran muffins. Popcorn. Rye wafer crackers. Vegetables Sweet potatoes. Spinach. Kale. Artichokes. Cabbage. Broccoli. Green peas. Carrots. Squash. Fruits Berries. Pears. Apples. Oranges. Avocados. Prunes and raisins. Dried figs. Meats and Other Protein Sources [place], kidney, pinto, and soy beans. Split peas. Lentils. Nuts and seeds. Dairy Fiber-fortified yogurt. Beverages Fiber-fortified soy milk. Fiber-fortified orange juice. Other Fiber bars. The items listed above may not be a complete list of recommended foods or beverages. Contact your dietitian for more options. WHAT FOODS ARE NOT RECOMMENDED? Grains White bread. Pasta made with refined flour. White rice. Vegetables Fried potatoes. Canned vegetables. Well-cooked vegetables.  Fruits Fruit juice. Cooked, strained fruit. Meats and Other Protein Sources Fatty cuts of meat. Fried Sales executive or fried fish. Dairy Milk. Yogurt. Cream cheese. Sour cream. Beverages Soft drinks. Other Cakes and pastries. Butter and oils. The items listed above may not be a complete list of foods and beverages to avoid. Contact your dietitian for more information. WHAT ARE SOME TIPS FOR INCLUDING HIGH-FIBER FOODS IN MY DIET?  Eat a wide variety of high-fiber foods.  Make sure that half of all grains consumed each day are whole grains.  Replace breads and cereals made from refined flour or white flour with whole-grain breads and cereals.  Replace white rice with brown rice, bulgur wheat, or millet.  Start the  day with a breakfast that is high in fiber, such as a cereal that contains at least 5 grams of fiber per serving.  Use beans in place of meat in soups, salads, or pasta.  Eat high-fiber snacks, such as berries, raw vegetables, nuts, or popcorn.   This information is not intended to replace advice given to you by your health care provider. Make sure you discuss any questions you have with your health care provider.   Document Released: 06/27/2005 Document Revised: 07/18/2014 Document Reviewed: 12/10/2013 Elsevier Interactive Patient Education [employer].

## 2016-03-30 NOTE — H&P (Signed)
Julie May is an 65 y.o. female.   Chief Complaint: Patient is here for colonoscopy. HPI: Patient is 56 old Caucasian female who is here for average risk screening colonoscopy. Last exam was 11 years ago and was normal. She denies abdominal pain change in bowel habits or rectal bleeding. Family history is negative for CRC.  Past Medical History:  Diagnosis Date  . Hyperlipidemia   . Hyperpotassemia   . Hypertension    new dx 11/12/2010  . OA (osteoarthritis)   . Other abnormal glucose   . Overweight(278.02)   . Polycythemia, secondary   . Routine general medical examination at a health care facility   . Trigger finger (acquired)   . Unspecified disorder of thyroid     Past Surgical History:  Procedure Laterality Date  . TONSILECTOMY, ADENOIDECTOMY, BILATERAL MYRINGOTOMY AND TUBES    . TONSILLECTOMY    . uterine polyp removal       Family History  Problem Relation Age of Onset  . Heart attack Father   . Stroke Mother   . Hypertension Mother   . Thyroid disease Sister   . Hyperlipidemia Brother   . Heart attack Brother   . Heart disease Brother   . Arthritis      family history   . Hypertension      family history   . Stroke      family history   . Heart disease      family history    Social History:  reports that she has never smoked. She has never used smokeless tobacco. She reports that she drinks alcohol. She reports that she does not use drugs.  Allergies:  Allergies  Allergen Reactions  . Aspirin Other (See Comments)    bruising    Medications Prior to Admission  Medication Sig Dispense Refill  . Levothyroxine Sodium (TIROSINT) 75 MCG CAPS Take 1 capsule (75 mcg total) by mouth daily before breakfast. 90 capsule 1    No results found for this or any previous visit (from the past 48 hour(s)). US Soft Tissue Head/neck  Result Date: 03/28/2016 CLINICAL DATA:  Prior ultrasound follow-up. Follow-up multi nodular goiter. EXAM: THYROID ULTRASOUND TECHNIQUE:  Ultrasound examination of the thyroid gland and adjacent soft tissues was performed. COMPARISON:  11/16/2010 FINDINGS: Parenchymal Echotexture: Normal Estimated total number of nodules >/= 1 cm: 0 Number of spongiform nodules > 2 cm not described below (TR1): 0 Number of mixed cystic and solid nodules > 1.5 cm not described below ([place]): 0 _________________________________________________________ Isthmus: Measures 0.2 cm. No discrete nodules are identified within the thyroid isthmus. _________________________________________________________ Right lobe: Measures 3.4 x 1.0 x 1.2 cm, previously measured 3.6 x 1.4 x 1.2 cm. No discrete nodules are identified within the right lobe of the thyroid. The previously described tissue along the inferior right thyroid lobe probably represented a pseudo nodule from the previous examination. _________________________________________________________ Left lobe: Measures 4.0 x 1.0 x 1.1 cm, previously measured at 3.2 x 1.6 x 1.8 cm. No discrete nodules are identified within the left lobe of the thyroid. Soft tissue along left side of the neck probably represents adjacent lymph nodes. This tissue measures 0.4 cm in the short axis. IMPRESSION: Normal appearance of the thyroid.  No discrete thyroid nodules. Probable adjacent lymph nodes on left side of the neck as described. Findings are nonspecific. Electronically Signed   By: Markus Daft M.D.   On: 03/28/2016 15:51    ROS  Blood pressure (!) 142/78, pulse 84, temperature  97.6 F (36.4 C), temperature source Oral, resp. rate 20, height 5\' 5"  (1.651 m), weight 166 lb (75.3 kg), SpO2 100 %. Physical Exam  Constitutional: She appears well-developed and well-nourished.  HENT:  Mouth/Throat: Oropharynx is clear and moist.  Eyes: Conjunctivae are normal. No scleral icterus.  Neck: No thyromegaly present.  Cardiovascular: Normal rate, regular rhythm and normal heart sounds.   No murmur heard. Respiratory: Effort normal and breath  sounds normal.  GI:  Abdomen is symmetrical soft and nontender without organomegaly or masses.  Musculoskeletal: She exhibits no edema.  Lymphadenopathy:    She has no cervical adenopathy.  Neurological: She is alert.  Skin: Skin is warm and dry.     Assessment/Plan Average risk screening colonoscopy.  Hildred Laser, MD 03/30/2016, 10:11 AM

## 2016-03-30 NOTE — Op Note (Signed)
[facility] Patient Name: Julie May Procedure Date: 03/30/2016 10:06 AM MRN: [id number] Date of Birth: [birth year] Attending MD: Hildred Laser , MD CSN: [id number] Age: 65 Admit Type: Outpatient Procedure:                Colonoscopy Indications:              Screening for colorectal malignant neoplasm Providers:                Hildred Laser, MD, Rosina Lowenstein, RN, Randa Spike, Technician Referring MD:             Norwood Levo. Simpson MD, MD Medicines:                Meperidine 50 mg IV, Midazolam 7 mg IV Complications:            No immediate complications. Estimated Blood Loss:     Estimated blood loss: none. Procedure:                Pre-Anesthesia Assessment:                           - Prior to the procedure, a History and Physical                            was performed, and patient medications and                            allergies were reviewed. The patient's tolerance of                            previous anesthesia was also reviewed. The risks                            and benefits of the procedure and the sedation                            options and risks were discussed with the patient.                            All questions were answered, and informed consent                            was obtained. Prior Anticoagulants: The patient has                            taken no previous anticoagulant or antiplatelet                            agents. ASA Grade Assessment: I - A normal, healthy                            patient. After reviewing the risks and benefits,  the patient was deemed in satisfactory condition to                            undergo the procedure.                           After obtaining informed consent, the colonoscope                            was passed under direct vision. Throughout the                            procedure, the patient's blood pressure, pulse, and                             oxygen saturations were monitored continuously. The                            was introduced through the anus and advanced to the                            the cecum, identified by appendiceal orifice and                            ileocecal valve. The colonoscopy was performed                            without difficulty. The patient tolerated the                            procedure well. The quality of the bowel                            preparation was excellent. The ileocecal valve,                            appendiceal orifice, and rectum were photographed. Scope In: 10:21:46 AM Scope Out: 10:41:09 AM Scope Withdrawal Time: 0 hours 9 minutes 23 seconds  Total Procedure Duration: 0 hours 19 minutes 23 seconds  Findings:      The perianal and digital rectal examinations were normal.      A few medium-mouthed diverticula were found in the sigmoid colon.      The exam was otherwise normal throughout the examined colon.      The retroflexed view of the distal rectum and anal verge was normal and       showed no anal or rectal abnormalities. Impression:               - Diverticulosis in the sigmoid colon.                           - No specimens collected. Moderate Sedation:      Moderate (conscious) sedation was administered by the endoscopy nurse       and supervised by the endoscopist. The following parameters were       monitored: oxygen saturation,  heart rate, blood pressure, CO2       capnography and response to care. Total physician intraservice time was       26 minutes. Recommendation:           - Patient has a contact number available for                            emergencies. The signs and symptoms of potential                            delayed complications were discussed with the                            patient. Return to normal activities tomorrow.                            Written discharge instructions were provided to the                             patient.                           - High fiber diet today.                           - Continue present medications.                           - Repeat colonoscopy in 10 years for screening                            purposes. Procedure Code(s):        --- Professional ---                           [phone], Colonoscopy, flexible; diagnostic, including                            collection of specimen(s) by brushing or washing,                            when performed (separate procedure)                           99152, Moderate sedation services provided by the                            same physician or other qualified health care                            professional performing the diagnostic or                            therapeutic service that the sedation supports,  requiring the presence of an independent trained                            observer to assist in the monitoring of the                            patient's level of consciousness and physiological                            status; initial 15 minutes of intraservice time,                            patient age 75 years or older                           [id number], Moderate sedation services; each additional                            15 minutes intraservice time Diagnosis Code(s):        --- Professional ---                           Z12.11, Encounter for screening for malignant                            neoplasm of colon                           K57.30, Diverticulosis of large intestine without                            perforation or abscess without bleeding CPT copyright 2016 [employer]. All rights reserved. The codes documented in this report are preliminary and upon coder review may  be revised to meet current compliance requirements. Hildred Laser, MD Hildred Laser, MD 03/30/2016 10:46:17 AM This report has been signed electronically. Number of Addenda: 0

## 2016-04-01 ENCOUNTER — Ambulatory Visit (INDEPENDENT_AMBULATORY_CARE_PROVIDER_SITE_OTHER): Payer: 59 | Admitting: "Endocrinology

## 2016-04-01 ENCOUNTER — Encounter: Payer: Self-pay | Admitting: "Endocrinology

## 2016-04-01 VITALS — BP 131/88 | HR 65 | Ht 65.0 in | Wt 166.0 lb

## 2016-04-01 DIAGNOSIS — E038 Other specified hypothyroidism: Secondary | ICD-10-CM

## 2016-04-01 NOTE — Progress Notes (Signed)
Subjective:    Patient ID: Julie May, female    DOB: Oct 26, 1950, PCP Tula Nakayama, MD   Past Medical History:  Diagnosis Date  . Hyperlipidemia   . Hyperpotassemia   . Hypertension    new dx 11/12/2010  . OA (osteoarthritis)   . Other abnormal glucose   . Overweight(278.02)   . Polycythemia, secondary   . Routine general medical examination at a health care facility   . Trigger finger (acquired)   . Unspecified disorder of thyroid    Past Surgical History:  Procedure Laterality Date  . TONSILECTOMY, ADENOIDECTOMY, BILATERAL MYRINGOTOMY AND TUBES    . TONSILLECTOMY    . uterine polyp removal      Social History   Social History  . Marital status: Married    Spouse name: N/A  . Number of children: 0  . Years of education: N/A   Occupational History  . book Therapist, nutritional     Social History Main Topics  . Smoking status: Never Smoker  . Smokeless tobacco: Never Used  . Alcohol use Yes     Comment: occ.  . Drug use: No  . Sexual activity: Yes    Birth control/ protection: Post-menopausal   Other Topics Concern  . None   Social History Narrative  . None   Outpatient Encounter Prescriptions as of 04/01/2016  Medication Sig  . Levothyroxine Sodium (TIROSINT) 75 MCG CAPS Take 1 capsule (75 mcg total) by mouth daily before breakfast.   No facility-administered encounter medications on file as of 04/01/2016.    ALLERGIES: Allergies  Allergen Reactions  . Aspirin Other (See Comments)    bruising   VACCINATION STATUS: Immunization History  Administered Date(s) Administered  . Influenza Split 06/22/2011  . Influenza Whole 05/18/2006, 04/30/2007, 04/14/2008  . Influenza,inj,Quad PF,36+ Mos 05/31/2013, 05/28/2014, 04/14/2015  . Td 08/13/2009  . Zoster 03/23/2011    HPI  65 yr old female with medical hx of hypothyroidism x 5 yrs on Tirosint. She is here for f/u. she was kept on Tirosint 75 mcg po qday, because she did not tolerate synthroid and LT4.  -She  is compliant to her medications.   Pt denies any complaints. No interval issues. she  Has lost 11 pounds of weight- 5-6 pounds due to her recent bowel prep for colonoscopy.  Review of Systems Constitutional: + weight loss, no fatigue, no subjective hyperthermia/hypothermia Eyes: no blurry vision, no xerophthalmia ENT: no sore throat, no nodules palpated in throat, no dysphagia/odynophagia, no hoarseness Cardiovascular: no CP/SOB/palpitations/leg swelling Respiratory: no cough/SOB Gastrointestinal: no N/V/D/C Musculoskeletal: no muscle/joint aches Skin: no rashes Neurological: no tremors/numbness/tingling/dizziness Psychiatric: no depression/anxiety  Objective:    BP 131/88   Pulse 65   Ht 5' 5" (1.651 m)   Wt 166 lb (75.3 kg)   BMI 27.62 kg/m   Wt Readings from Last 3 Encounters:  04/01/16 166 lb (75.3 kg)  03/30/16 166 lb (75.3 kg)  09/25/15 177 lb (80.3 kg)    Physical Exam Constitutional: overweight, in NAD Eyes: PERRLA, EOMI, no exophthalmos ENT: moist mucous membranes, palpable thyroid, no cervical lymphadenopathy Cardiovascular: RRR, No MRG Respiratory: CTA B Gastrointestinal: abdomen soft, NT, ND, BS+ Musculoskeletal: bilateral feet deformities, strength intact in all 4 Skin: moist, warm, no rashes Neurological: no tremor with outstretched hands, DTR normal in all 4  CMP     Component Value Date/Time   NA 139 11/12/2010 0904   K 5.8 (H) 11/12/2010 0904   CL 104 11/12/2010 0904  CO2 24 11/12/2010 0904   GLUCOSE 97 11/12/2010 0904   BUN 18 11/12/2010 0904   CREATININE 1.04 11/12/2010 0904   CALCIUM 9.7 11/12/2010 0904   PROT 6.3 03/11/2010 0844   ALBUMIN 3.7 03/11/2010 0844   AST 30 03/11/2010 0844   ALT 35 03/11/2010 0844   ALKPHOS 103 03/11/2010 0844   BILITOT 0.8 03/11/2010 0844   GFRNONAA >60 03/11/2010 0844   GFRAA  03/11/2010 0844    >60        The eGFR has been calculated using the MDRD equation. This calculation has not been validated in  all clinical situations. eGFR's persistently <60 mL/min signify possible Chronic Kidney Disease.     Diabetic Labs (most recent): Lab Results  Component Value Date   HGBA1C 5.6 09/18/2015   HGBA1C 5.9 (H) 03/22/2012   HGBA1C 6.2 (H) 12/02/2011      Lipid Panel     Component Value Date/Time   CHOL 203 (H) 03/25/2016 0824   TRIG 34 03/25/2016 0824   HDL 72 03/25/2016 0824   CHOLHDL 2.8 03/25/2016 0824   VLDL 7 03/25/2016 0824   LDLCALC 124 (H) 03/25/2016 0824     Results for HANA, TRIPPETT (MRN [id number]) as of 04/01/2016 10:53  Ref. Range 09/18/2015 09:36 03/25/2016 08:23 03/25/2016 08:24  TSH Latest Ref Range: 0.350 - 4.500 uIU/mL 0.793  5.286 (H)  T4,Free(Direct) Latest Ref Range: 0.61 - 1.12 ng/dL 1.62 1.15 (H)      Assessment & Plan:   1. Other specified hypothyroidism Her Free T4 is on target while TSH is above target. She is clinically improving. I will continue Tirosint  75 mcg po qam.   - We discussed about correct intake of levothyroxine, at fasting, with water, separated by at least 30 minutes from breakfast, and separated by more than 4 hours from calcium, iron, multivitamins, acid reflux medications (PPIs). -Patient is made aware of the fact that thyroid hormone replacement is needed for life, dose to be adjusted by periodic monitoring of thyroid function tests.   2. Hyperlipemia Her last lipid panel shows increasing LDL while improving her HDL. Will not need medications , I advised her on diet and exercise.   3. Prediabetes -She was diagnosed with prediabetes last visit, a1c  improved to 5.6%. Detailed information on diabetes and complications given to her. she will not need medications for prediabetes now. I advised her to avoid simple CHO.  4. Nontoxic multinodular goiter -Her last ultrasound from 2012 showed bilateral multiple small thyroid nodules. Her repeat thyroid ultrasound is essentially unchanged with no significant pathology findings..   - I  advised patient to maintain close follow up with Tula Nakayama, MD for primary care needs. Follow up plan: Return in about 3 months (around 07/01/2016) for follow up with pre-visit labs.  Glade Lloyd, MD Phone: [phone]  Fax: [phone]   04/01/2016, 10:53 AM

## 2016-04-05 ENCOUNTER — Other Ambulatory Visit: Payer: Self-pay | Admitting: Family Medicine

## 2016-04-05 DIAGNOSIS — Z1231 Encounter for screening mammogram for malignant neoplasm of breast: Secondary | ICD-10-CM

## 2016-04-06 ENCOUNTER — Encounter (HOSPITAL_COMMUNITY): Payer: Self-pay | Admitting: Internal Medicine

## 2016-04-19 ENCOUNTER — Ambulatory Visit: Payer: 59 | Admitting: Orthopedic Surgery

## 2016-04-20 ENCOUNTER — Ambulatory Visit (INDEPENDENT_AMBULATORY_CARE_PROVIDER_SITE_OTHER): Payer: 59

## 2016-04-20 ENCOUNTER — Ambulatory Visit (INDEPENDENT_AMBULATORY_CARE_PROVIDER_SITE_OTHER): Payer: 59 | Admitting: Orthopedic Surgery

## 2016-04-20 ENCOUNTER — Encounter: Payer: Self-pay | Admitting: Orthopedic Surgery

## 2016-04-20 VITALS — BP 137/84 | HR 78 | Wt 166.0 lb

## 2016-04-20 DIAGNOSIS — M1712 Unilateral primary osteoarthritis, left knee: Secondary | ICD-10-CM | POA: Diagnosis not present

## 2016-04-20 NOTE — Progress Notes (Signed)
Patient ID: Julie May, female   DOB: Jun 13, 1951, 65 y.o.   MRN: [id number]  Chief Complaint  Patient presents with  . Follow-up    YEARLY XRAY LEFT KNEE OA    HPI Julie May is a 65 y.o. female.   HPI 65 years old osteoarthritis left knee comes in for annual follow-up. She says she takes one aspirin occasionally. She says she is able to get up out of a chair well getting out of a car while she is not having a lot of difficulty. She's taking water aerobics which seems to be helping  Review of Systems Review of Systems Normal neuro  Denies fever  Examination BP 137/84   Pulse 78   Wt 166 lb (75.3 kg)   BMI 27.62 kg/m   Gen. appearance the patient's appearance is normal with normal grooming and  hygiene The patient is oriented to person place and time Mood and affect are normal   Ortho Exam Gait is remarkable for valgus gait no limp Inspection reveals valgus alignment lateral joint line mild tenderness on patellofemoral crepitance ROM is 115 Stability tests are normal  Motor exam 5/5 manual muscle testing , no atrophy  Skin is normal (no rash or erythema)    Medical decision-making Diagnosis, Data, Plan (risk)  Plain films show valgus alignment with medial compartment joint space narrowing and patellofemoral primary arthritis      Arther Abbott, MD 04/20/2016 9:44 AM

## 2016-04-27 ENCOUNTER — Ambulatory Visit (INDEPENDENT_AMBULATORY_CARE_PROVIDER_SITE_OTHER): Payer: 59 | Admitting: Family Medicine

## 2016-04-27 ENCOUNTER — Encounter: Payer: Self-pay | Admitting: Family Medicine

## 2016-04-27 ENCOUNTER — Other Ambulatory Visit (HOSPITAL_COMMUNITY)
Admission: RE | Admit: 2016-04-27 | Discharge: 2016-04-27 | Disposition: A | Discharge status: Home / Self Care | Payer: 59 | Source: Ambulatory Visit | Attending: Family Medicine | Admitting: Family Medicine

## 2016-04-27 VITALS — BP 122/84 | HR 82 | Ht 65.0 in | Wt 167.4 lb

## 2016-04-27 DIAGNOSIS — Z0000 Encounter for general adult medical examination without abnormal findings: Secondary | ICD-10-CM

## 2016-04-27 DIAGNOSIS — Z1211 Encounter for screening for malignant neoplasm of colon: Secondary | ICD-10-CM | POA: Diagnosis not present

## 2016-04-27 DIAGNOSIS — Z01419 Encounter for gynecological examination (general) (routine) without abnormal findings: Secondary | ICD-10-CM | POA: Diagnosis not present

## 2016-04-27 DIAGNOSIS — Z124 Encounter for screening for malignant neoplasm of cervix: Secondary | ICD-10-CM | POA: Diagnosis not present

## 2016-04-27 DIAGNOSIS — Z23 Encounter for immunization: Secondary | ICD-10-CM | POA: Diagnosis not present

## 2016-04-27 LAB — POC HEMOCCULT BLD/STL (OFFICE/1-CARD/DIAGNOSTIC): Fecal Occult Blood, POC: NEGATIVE

## 2016-04-27 NOTE — Patient Instructions (Addendum)
F/u in 5.5 months, call if you need me before  Non fast vit D and hepatitis C in next 5.5 month  Script for orthotics today  Ensure intake of 100 to 1200 mg calcium daily for bone health, need to read food labels!  Reduce egg yolk, red meat, fried and fatty foods please   It is important that you exercise regularly at least 30 minutes 5 times a week. If you develop chest pain, have severe difficulty breathing, or feel very tired, stop exercising immediately and seek medical attention

## 2016-04-27 NOTE — Assessment & Plan Note (Signed)

## 2016-04-28 ENCOUNTER — Encounter: Payer: Self-pay | Admitting: Family Medicine

## 2016-04-28 NOTE — Progress Notes (Signed)
    Julie May     MRN: [id number]      DOB: 06-Feb-1951  HPI: Patient is in for annual physical exam. Requests orthotics for both feet Recent consultations are reviewed Recent labs, if available are reviewed. Immunization is reviewed , and  updated if needed.   PE: Pleasant  female, alert and oriented x 3, in no cardio-pulmonary distress. Afebrile. HEENT No facial trauma or asymetry. Sinuses non tender.  Extra occullar muscles intact, pupils equally reactive to light. External ears normal, tympanic membranes clear. Oropharynx moist, no exudate. Neck: supple, no adenopathy,JVD or thyromegaly.No bruits.  Chest: Clear to ascultation bilaterally.No crackles or wheezes. Non tender to palpation  Breast: No asymetry,no masses or lumps. No tenderness. No nipple discharge or inversion. No axillary or supraclavicular adenopathy  Cardiovascular system; Heart sounds normal,  S1 and  S2 ,no S3.  No murmur, or thrill. Apical beat not displaced Peripheral pulses normal.  Abdomen: Soft, non tender, no organomegaly or masses. No bruits. Bowel sounds normal. No guarding, tenderness or rebound.  Rectal:  Normal sphincter tone. No rectal mass. Guaiac negative stool.  GU: External genitalia normal female genitalia , normal female distribution of hair. No lesions. Urethral meatus normal in size, no  Prolapse, no lesions visibly  Present. Bladder non tender. Vagina pink and moist , with no visible lesions , discharge present . Adequate pelvic support no  cystocele or rectocele noted Cervix pink and appears healthy, no lesions or ulcerations noted, no discharge noted from os Uterus normal size, no adnexal masses, no cervical motion or adnexal tenderness.   Musculoskeletal exam: Full ROM of spine, hips , shoulders and knees. No deformity ,swelling or crepitus noted. No muscle wasting or atrophy.   Neurologic: Cranial nerves 2 to 12 intact. Power, tone ,sensation and reflexes  normal throughout. No disturbance in gait. No tremor.  Skin: Intact, no ulceration, erythema , scaling or rash noted. Pigmentation normal throughout  Psych; Normal mood and affect. Judgement and concentration normal   Assessment & Plan:  Annual physical exam Annual exam as documented. Counseling done  re healthy lifestyle involving commitment to 150 minutes exercise per week, heart healthy diet, and attaining healthy weight.The importance of adequate sleep also discussed. Regular seat belt use and home safety, is also discussed. Changes in health habits are decided on by the patient with goals and time frames  set for achieving them. Immunization and cancer screening needs are specifically addressed at this visit.

## 2016-04-29 LAB — CYTOLOGY - PAP: Diagnosis: NEGATIVE

## 2016-05-04 ENCOUNTER — Ambulatory Visit (HOSPITAL_COMMUNITY)
Admission: RE | Admit: 2016-05-04 | Discharge: 2016-05-04 | Disposition: A | Discharge status: Home / Self Care | Payer: 59 | Source: Ambulatory Visit | Attending: Family Medicine | Admitting: Family Medicine

## 2016-05-04 DIAGNOSIS — Z1231 Encounter for screening mammogram for malignant neoplasm of breast: Secondary | ICD-10-CM | POA: Insufficient documentation

## 2016-05-23 MED FILL — TIROSINT 75 MCG CAPSULE: 75 | 84 days supply | Qty: 84 | Fill #1

## 2016-06-10 DIAGNOSIS — M79672 Pain in left foot: Secondary | ICD-10-CM | POA: Diagnosis not present

## 2016-06-10 DIAGNOSIS — M79671 Pain in right foot: Secondary | ICD-10-CM | POA: Diagnosis not present

## 2016-06-12 ENCOUNTER — Encounter: Payer: Self-pay | Admitting: Family Medicine

## 2016-06-24 ENCOUNTER — Other Ambulatory Visit (HOSPITAL_COMMUNITY)
Admission: RE | Admit: 2016-06-24 | Discharge: 2016-06-24 | Disposition: A | Discharge status: Home / Self Care | Payer: 59 | Source: Ambulatory Visit | Attending: "Endocrinology | Admitting: "Endocrinology

## 2016-06-24 DIAGNOSIS — E038 Other specified hypothyroidism: Secondary | ICD-10-CM | POA: Diagnosis not present

## 2016-06-24 LAB — T4, FREE: FREE T4: 1.14 ng/dL — AB (ref 0.61–1.12)

## 2016-06-24 LAB — COMPREHENSIVE METABOLIC PANEL
ALT: 38 U/L (ref 14–54)
AST: 31 U/L (ref 15–41)
Albumin: 4.1 g/dL (ref 3.5–5.0)
Alkaline Phosphatase: 91 U/L (ref 38–126)
Anion gap: 9 (ref 5–15)
BUN: 13 mg/dL (ref 6–20)
CHLORIDE: 102 mmol/L (ref 101–111)
CO2: 27 mmol/L (ref 22–32)
CREATININE: 0.91 mg/dL (ref 0.44–1.00)
Calcium: 9.6 mg/dL (ref 8.9–10.3)
GFR calc non Af Amer: >60 mL/min (ref >60)
Glucose, Bld: 110 mg/dL — ABNORMAL HIGH (ref 65–99)
POTASSIUM: 4.6 mmol/L (ref 3.5–5.1)
SODIUM: 138 mmol/L (ref 135–145)
Total Bilirubin: 0.7 mg/dL (ref 0.3–1.2)
Total Protein: 7.3 g/dL (ref 6.5–8.1)

## 2016-06-24 LAB — TSH: TSH: 4 u[IU]/mL (ref 0.350–4.500)

## 2016-07-01 ENCOUNTER — Ambulatory Visit (INDEPENDENT_AMBULATORY_CARE_PROVIDER_SITE_OTHER): Payer: 59 | Admitting: "Endocrinology

## 2016-07-01 ENCOUNTER — Encounter: Payer: Self-pay | Admitting: "Endocrinology

## 2016-07-01 VITALS — BP 134/83 | HR 63 | Ht 65.0 in | Wt 169.0 lb

## 2016-07-01 DIAGNOSIS — E038 Other specified hypothyroidism: Secondary | ICD-10-CM | POA: Diagnosis not present

## 2016-07-01 DIAGNOSIS — E782 Mixed hyperlipidemia: Secondary | ICD-10-CM

## 2016-07-01 MED ORDER — LEVOTHYROXINE SODIUM 88 MCG PO CAPS: 88.0000 ug | ORAL_CAPSULE | Freq: Every day | ORAL | 6 refills | Status: DC

## 2016-07-01 NOTE — Progress Notes (Signed)
Subjective:    Patient ID: Julie May, female    DOB: 06/10/1951, PCP Tula Nakayama, MD   Past Medical History:  Diagnosis Date  . Hyperlipidemia   . Hyperpotassemia   . Hypertension    new dx 11/12/2010  . OA (osteoarthritis)   . Other abnormal glucose   . Overweight(278.02)   . Polycythemia, secondary   . Routine general medical examination at a health care facility   . Trigger finger (acquired)   . Unspecified disorder of thyroid    Past Surgical History:  Procedure Laterality Date  . COLONOSCOPY N/A 03/30/2016   Procedure: COLONOSCOPY;  Surgeon: Rogene Houston, MD;  Location: [facility];  Service: Endoscopy;  Laterality: N/A;  1030  . TONSILECTOMY, ADENOIDECTOMY, BILATERAL MYRINGOTOMY AND TUBES    . TONSILLECTOMY    . uterine polyp removal      Social History   Social History  . Marital status: Married    Spouse name: N/A  . Number of children: 0  . Years of education: N/A   Occupational History  . book Therapist, nutritional     Social History Main Topics  . Smoking status: Never Smoker  . Smokeless tobacco: Never Used  . Alcohol use Yes     Comment: occ.  . Drug use: No  . Sexual activity: Yes    Birth control/ protection: Post-menopausal   Other Topics Concern  . None   Social History Narrative  . None   Outpatient Encounter Prescriptions as of 07/01/2016  Medication Sig  . Levothyroxine Sodium (TIROSINT) 88 MCG CAPS Take 1 capsule (88 mcg total) by mouth daily before breakfast.  . [DISCONTINUED] Levothyroxine Sodium (TIROSINT) 75 MCG CAPS Take 1 capsule (75 mcg total) by mouth daily before breakfast.   No facility-administered encounter medications on file as of 07/01/2016.    ALLERGIES: Allergies  Allergen Reactions  . Aspirin Other (See Comments)    Bruising if taken daily, pt reports occasional use is not a problem, therefore technically intolerance, not allergy   VACCINATION STATUS: Immunization History  Administered Date(s) Administered  .  Influenza Split 06/22/2011  . Influenza Whole 05/18/2006, 04/30/2007, 04/14/2008  . Influenza,inj,Quad PF,36+ Mos 05/31/2013, 05/28/2014, 04/14/2015, 04/27/2016  . Td 08/13/2009  . Zoster 03/23/2011    HPI  66 yr old female with medical hx of hypothyroidism x 5 yrs on Tirosint. She is here for f/u. she was kept on Tirosint 75 mcg po qday, because she did not tolerate synthroid and LT4.  -She is compliant to her medications.   Pt denies any complaints. No interval issues.  - She has steady weight after she  lost 11 pounds of weight.   Review of Systems Constitutional: - weight loss, no fatigue, no subjective hyperthermia/hypothermia Eyes: no blurry vision, no xerophthalmia ENT: no sore throat, no nodules palpated in throat, no dysphagia/odynophagia, no hoarseness Cardiovascular: no CP/SOB/palpitations/leg swelling Respiratory: no cough/SOB Gastrointestinal: no N/V/D/C Musculoskeletal: no muscle/joint aches Skin: no rashes Neurological: no tremors/numbness/tingling/dizziness Psychiatric: no depression/anxiety  Objective:    BP 134/83   Pulse 63   Ht 5\' 5"  (1.651 m)   Wt 169 lb (76.7 kg)   BMI 28.12 kg/m   Wt Readings from Last 3 Encounters:  07/01/16 169 lb (76.7 kg)  04/27/16 167 lb 6.4 oz (75.9 kg)  04/20/16 166 lb (75.3 kg)    Physical Exam Constitutional: overweight, in NAD Eyes: PERRLA, EOMI, no exophthalmos ENT: moist mucous membranes, palpable thyroid, no cervical lymphadenopathy Cardiovascular: RRR, No  MRG Respiratory: CTA B Gastrointestinal: abdomen soft, NT, ND, BS+ Musculoskeletal: bilateral feet deformities, strength intact in all 4 Skin: moist, warm, no rashes Neurological: no tremor with outstretched hands, DTR normal in all 4  CMP     Component Value Date/Time   NA 138 06/24/2016 0858   K 4.6 06/24/2016 0858   CL 102 06/24/2016 0858   CO2 27 06/24/2016 0858   GLUCOSE 110 (H) 06/24/2016 0858   BUN 13 06/24/2016 0858   CREATININE 0.91 06/24/2016  0858   CREATININE 1.04 11/12/2010 0904   CALCIUM 9.6 06/24/2016 0858   PROT 7.3 06/24/2016 0858   ALBUMIN 4.1 06/24/2016 0858   AST 31 06/24/2016 0858   ALT 38 06/24/2016 0858   ALKPHOS 91 06/24/2016 0858   BILITOT 0.7 06/24/2016 0858   GFRNONAA >60 06/24/2016 0858   GFRAA >60 06/24/2016 0858     Diabetic Labs (most recent): Lab Results  Component Value Date   HGBA1C 5.6 09/18/2015   HGBA1C 5.9 (H) 03/22/2012   HGBA1C 6.2 (H) 12/02/2011      Lipid Panel     Component Value Date/Time   CHOL 203 (H) 03/25/2016 0824   TRIG 34 03/25/2016 0824   HDL 72 03/25/2016 0824   CHOLHDL 2.8 03/25/2016 0824   VLDL 7 03/25/2016 0824   LDLCALC 124 (H) 03/25/2016 0824     Results for Julie May, Julie May (MRN [id number]) as of 07/01/2016 11:34  Ref. Range 09/18/2015 09:36 03/25/2016 08:23 03/25/2016 08:24 06/24/2016 08:57 06/24/2016 08:58  TSH Latest Ref Range: 0.350 - 4.500 uIU/mL 0.793  5.286 (H) 4.000   T4,Free(Direct) Latest Ref Range: 0.61 - 1.12 ng/dL 1.62 1.15 (H)   1.14 (H)    Assessment & Plan:   1. Other specified hypothyroidism Her Free T4 is on target while TSH is above target. She is clinically improving. - She would benefit from slight increase in her Tirosint  To 88 mcg po qam.   - We discussed about correct intake of levothyroxine, at fasting, with water, separated by at least 30 minutes from breakfast, and separated by more than 4 hours from calcium, iron, multivitamins, acid reflux medications (PPIs). -Patient is made aware of the fact that thyroid hormone replacement is needed for life, dose to be adjusted by periodic monitoring of thyroid function tests.   2. Hyperlipemia Her last lipid panel shows increasing LDL while improving her HDL. Will not need medications , I advised her on diet and exercise.   3. Prediabetes -She was diagnosed with prediabetes last visit, a1c  improved to 5.6%. Detailed information on diabetes and complications given to her. she will not need  medications for prediabetes now. I advised her to avoid simple CHO.  4. Nontoxic multinodular goiter -Her last ultrasound from 2012 showed bilateral multiple small thyroid nodules. Her repeat thyroid ultrasound is essentially unchanged with no significant pathology findings..   - I advised patient to maintain close follow up with Tula Nakayama, MD for primary care needs. Follow up plan: Return in about 6 months (around 12/30/2016) for follow up with pre-visit labs.  Glade Lloyd, MD Phone: [phone]  Fax: [phone]   07/01/2016, 11:33 AM

## 2016-07-06 MED FILL — TIROSINT 88 MCG CAPSULE: 88 | 84 days supply | Qty: 84 | Fill #0

## 2016-07-07 DIAGNOSIS — M79672 Pain in left foot: Secondary | ICD-10-CM | POA: Diagnosis not present

## 2016-07-07 DIAGNOSIS — M79671 Pain in right foot: Secondary | ICD-10-CM | POA: Diagnosis not present

## 2016-09-06 ENCOUNTER — Encounter: Payer: Self-pay | Admitting: "Endocrinology

## 2016-09-07 ENCOUNTER — Encounter: Payer: Self-pay | Admitting: "Endocrinology

## 2016-09-07 NOTE — Telephone Encounter (Signed)
Pt sent question about Tirosint

## 2016-09-13 ENCOUNTER — Other Ambulatory Visit (HOSPITAL_COMMUNITY)
Admission: RE | Admit: 2016-09-13 | Discharge: 2016-09-13 | Disposition: A | Discharge status: Home / Self Care | Payer: 59 | Source: Ambulatory Visit | Attending: "Endocrinology | Admitting: "Endocrinology

## 2016-09-13 DIAGNOSIS — E038 Other specified hypothyroidism: Secondary | ICD-10-CM | POA: Diagnosis not present

## 2016-09-13 LAB — TSH: TSH: 0.93 u[IU]/mL (ref 0.350–4.500)

## 2016-09-14 ENCOUNTER — Other Ambulatory Visit: Payer: Self-pay | Admitting: "Endocrinology

## 2016-09-14 ENCOUNTER — Encounter: Payer: Self-pay | Admitting: "Endocrinology

## 2016-09-14 LAB — T4, FREE: Free T4: 1.33 ng/dL — ABNORMAL HIGH (ref 0.61–1.12)

## 2016-09-14 MED ORDER — LEVOTHYROXINE SODIUM 75 MCG PO CAPS: 75.0000 ug | ORAL_CAPSULE | Freq: Every day | ORAL | 6 refills | Status: DC

## 2016-09-26 ENCOUNTER — Encounter: Payer: Self-pay | Admitting: "Endocrinology

## 2016-10-08 ENCOUNTER — Encounter: Payer: Self-pay | Admitting: Family Medicine

## 2016-10-10 ENCOUNTER — Telehealth: Payer: Self-pay

## 2016-10-10 DIAGNOSIS — R7303 Prediabetes: Secondary | ICD-10-CM

## 2016-10-10 DIAGNOSIS — E782 Mixed hyperlipidemia: Secondary | ICD-10-CM

## 2016-10-10 DIAGNOSIS — E038 Other specified hypothyroidism: Secondary | ICD-10-CM

## 2016-10-10 NOTE — Telephone Encounter (Signed)
Labs ordered.

## 2016-10-11 ENCOUNTER — Other Ambulatory Visit (HOSPITAL_COMMUNITY)
Admission: RE | Admit: 2016-10-11 | Discharge: 2016-10-11 | Disposition: A | Discharge status: Home / Self Care | Payer: 59 | Source: Ambulatory Visit | Attending: Family Medicine | Admitting: Family Medicine

## 2016-10-11 DIAGNOSIS — R7303 Prediabetes: Secondary | ICD-10-CM | POA: Diagnosis not present

## 2016-10-11 DIAGNOSIS — E782 Mixed hyperlipidemia: Secondary | ICD-10-CM | POA: Diagnosis not present

## 2016-10-11 LAB — CBC WITH DIFFERENTIAL/PLATELET
BASOS ABS: 0 10*3/uL (ref 0.0–0.1)
BASOS PCT: 0 %
Eosinophils Absolute: 0.1 10*3/uL (ref 0.0–0.7)
Eosinophils Relative: 2 %
HCT: 41.9 % (ref 36.0–46.0)
Hemoglobin: 14.4 g/dL (ref 12.0–15.0)
Lymphocytes Relative: 39 %
Lymphs Abs: 2 10*3/uL (ref 0.7–4.0)
MCH: 28.9 pg (ref 26.0–34.0)
MCHC: 34.4 g/dL (ref 30.0–36.0)
MCV: 84.1 fL (ref 78.0–100.0)
MONO ABS: 0.3 10*3/uL (ref 0.1–1.0)
Monocytes Relative: 7 %
Neutro Abs: 2.6 10*3/uL (ref 1.7–7.7)
Neutrophils Relative %: 52 %
Platelets: 230 10*3/uL (ref 150–400)
RBC: 4.98 MIL/uL (ref 3.87–5.11)
RDW: 13.1 % (ref 11.5–15.5)
WBC: 5 10*3/uL (ref 4.0–10.5)

## 2016-10-11 LAB — COMPREHENSIVE METABOLIC PANEL
ALBUMIN: 3.8 g/dL (ref 3.5–5.0)
ALT: 26 U/L (ref 14–54)
ANION GAP: 10 (ref 5–15)
AST: 25 U/L (ref 15–41)
Alkaline Phosphatase: 93 U/L (ref 38–126)
BILIRUBIN TOTAL: 0.4 mg/dL (ref 0.3–1.2)
BUN: 12 mg/dL (ref 6–20)
CO2: 26 mmol/L (ref 22–32)
Calcium: 9 mg/dL (ref 8.9–10.3)
Chloride: 100 mmol/L — ABNORMAL LOW (ref 101–111)
Creatinine, Ser: 0.78 mg/dL (ref 0.44–1.00)
GFR calc Af Amer: >60 mL/min (ref >60)
Glucose, Bld: 106 mg/dL — ABNORMAL HIGH (ref 65–99)
POTASSIUM: 4 mmol/L (ref 3.5–5.1)
Sodium: 136 mmol/L (ref 135–145)
TOTAL PROTEIN: 6.8 g/dL (ref 6.5–8.1)

## 2016-10-11 LAB — LIPID PANEL
CHOL/HDL RATIO: 2.6 ratio
CHOLESTEROL: 174 mg/dL (ref 0–200)
HDL: 66 mg/dL (ref >40)
LDL Cholesterol: 100 mg/dL — ABNORMAL HIGH (ref 0–99)
Triglycerides: 40 mg/dL (ref <150)
VLDL: 8 mg/dL (ref 0–40)

## 2016-10-12 ENCOUNTER — Encounter: Payer: Self-pay | Admitting: Family Medicine

## 2016-10-12 ENCOUNTER — Ambulatory Visit (INDEPENDENT_AMBULATORY_CARE_PROVIDER_SITE_OTHER): Payer: 59 | Admitting: Family Medicine

## 2016-10-12 VITALS — BP 126/68 | HR 76 | Temp 97.8°F | Resp 16 | Ht 65.0 in | Wt 169.0 lb

## 2016-10-12 DIAGNOSIS — R7302 Impaired glucose tolerance (oral): Secondary | ICD-10-CM | POA: Diagnosis not present

## 2016-10-12 DIAGNOSIS — E663 Overweight: Secondary | ICD-10-CM | POA: Diagnosis not present

## 2016-10-12 DIAGNOSIS — M79671 Pain in right foot: Secondary | ICD-10-CM

## 2016-10-12 DIAGNOSIS — E038 Other specified hypothyroidism: Secondary | ICD-10-CM

## 2016-10-12 DIAGNOSIS — E784 Other hyperlipidemia: Secondary | ICD-10-CM

## 2016-10-12 DIAGNOSIS — E7849 Other hyperlipidemia: Secondary | ICD-10-CM

## 2016-10-12 DIAGNOSIS — M79672 Pain in left foot: Secondary | ICD-10-CM | POA: Diagnosis not present

## 2016-10-12 LAB — HEPATITIS C ANTIBODY

## 2016-10-12 LAB — HEMOGLOBIN A1C
Hgb A1c MFr Bld: 5.6 % (ref 4.8–5.6)
MEAN PLASMA GLUCOSE: 114 mg/dL

## 2016-10-12 LAB — HIV ANTIBODY (ROUTINE TESTING W REFLEX): HIV SCREEN 4TH GENERATION: NONREACTIVE

## 2016-10-12 LAB — VITAMIN D 25 HYDROXY (VIT D DEFICIENCY, FRACTURES): Vit D, 25-Hydroxy: 27.9 ng/mL — ABNORMAL LOW (ref 30.0–100.0)

## 2016-10-12 MED ORDER — UNABLE TO FIND: Status: DC

## 2016-10-12 NOTE — Assessment & Plan Note (Signed)
Improved with diet only Hyperlipidemia:Low fat diet discussed and encouraged.   Lipid Panel  Lab Results  Component Value Date   CHOL 174 10/11/2016   HDL 66 10/11/2016   LDLCALC 100 (H) 10/11/2016   TRIG 40 10/11/2016   CHOLHDL 2.6 10/11/2016   Nearly normal , repeat in 12 month

## 2016-10-12 NOTE — Assessment & Plan Note (Signed)
Unchanged Patient re-educated about  the importance of commitment to a  minimum of 150 minutes of exercise per week.  The importance of healthy food choices with portion control discussed. Encouraged to start a food diary, count calories and to consider  joining a support group. Sample diet sheets offered. Goals set by the patient for the next several months.   Weight /BMI 10/12/2016 07/01/2016 04/27/2016  WEIGHT 169 lb 169 lb 167 lb 6.4 oz  HEIGHT 5\' 5"  5\' 5"  5\' 5"   BMI 28.12 kg/m2 28.12 kg/m2 27.86 kg/m2

## 2016-10-12 NOTE — Assessment & Plan Note (Signed)
Callus on feet responds to topical salicylate, script sent as was written in the past by dermatology

## 2016-10-12 NOTE — Assessment & Plan Note (Addendum)
Corrected and doing very well, fBG at 100 and normal hBA1C in 2018 Patient educated about the importance of limiting  Carbohydrate intake , the need to commit to daily physical activity for a minimum of 30 minutes , and to commit weight loss. The fact that changes in all these areas will reduce or eliminate all together the development of diabetes is stressed.   Diabetic Labs Latest Ref Rng & Units 10/11/2016 06/24/2016 03/25/2016 09/18/2015 03/22/2012  HbA1c 4.8 - 5.6 % 5.6 - - 5.6 5.9(H)  Chol 0 - 200 mg/dL 174 - 203(H) - -  HDL >40 mg/dL 66 - 72 - -  Calc LDL 0 - 99 mg/dL 100(H) - 124(H) - -  Triglycerides <150 mg/dL 40 - 34 - -  Creatinine 0.44 - 1.00 mg/dL 0.78 0.91 - - -   BP/Weight 10/12/2016 07/01/2016 04/27/2016 04/20/2016 04/01/2016 03/30/2016 6/71/2458  Systolic BP 099 833 825 053 976 92 734  Diastolic BP 68 83 84 84 88 59 84  Wt. (Lbs) 169 169 167.4 166 166 166 177  BMI 28.12 28.12 27.86 27.62 27.62 27.62 29.45   No flowsheet data found.

## 2016-10-12 NOTE — Progress Notes (Signed)
Julie May     MRN: [id number]      DOB: 05/26/1951   HPI Julie May is here for follow up and re-evaluation of chronic medical conditions, medication management and review of any available recent lab and radiology data.  Preventive health is updated, specifically  Cancer screening and Immunization.   Questions or concerns regarding consultations or procedures which the PT has had in the interim are  addressed. The PT denies any adverse reactions to current medications since the last visit.  There are no new concerns.  There are no specific complaints   ROS Denies recent fever or chills. Denies sinus pressure, nasal congestion, ear pain or sore throat. Denies chest congestion, productive cough or wheezing. Denies chest pains, palpitations and leg swelling Denies abdominal pain, nausea, vomiting,diarrhea or constipation.   Denies dysuria, frequency, hesitancy or incontinence. Denies joint pain, swelling and limitation in mobility. Denies headaches, seizures, numbness, or tingling. Denies depression, anxiety or insomnia. Denies skin break down or rash.   PE  BP 126/68 (BP Location: Left Arm, Patient Position: Sitting, Cuff Size: Normal)   Pulse 76   Temp 97.8 F (36.6 C) (Temporal)   Resp 16   Ht 5\' 5"  (1.651 m)   Wt 169 lb (76.7 kg)   SpO2 99%   BMI 28.12 kg/m   Patient alert and oriented and in no cardiopulmonary distress.  HEENT: No facial asymmetry, EOMI,   oropharynx pink and moist.  Neck supple no JVD, no mass.  Chest: Clear to auscultation bilaterally.  CVS: S1, S2 no murmurs, no S3.Regular rate.  ABD: Soft non tender.   Ext: No edema  MS: Adequate ROM spine, shoulders, hips and knees.  Skin: Intact, no ulcerations or rash noted.  Psych: Good eye contact, normal affect. Memory intact not anxious or depressed appearing.  CNS: CN 2-12 intact, power,  normal throughout.no focal deficits noted.   Assessment & Plan  IGT (impaired glucose  tolerance) Corrected and doing very well, fBG at 100 and normal hBA1C in 2018 Patient educated about the importance of limiting  Carbohydrate intake , the need to commit to daily physical activity for a minimum of 30 minutes , and to commit weight loss. The fact that changes in all these areas will reduce or eliminate all together the development of diabetes is stressed.   Diabetic Labs Latest Ref Rng & Units 10/11/2016 06/24/2016 03/25/2016 09/18/2015 03/22/2012  HbA1c 4.8 - 5.6 % 5.6 - - 5.6 5.9(H)  Chol 0 - 200 mg/dL 174 - 203(H) - -  HDL >40 mg/dL 66 - 72 - -  Calc LDL 0 - 99 mg/dL 100(H) - 124(H) - -  Triglycerides <150 mg/dL 40 - 34 - -  Creatinine 0.44 - 1.00 mg/dL 0.78 0.91 - - -   BP/Weight 10/12/2016 07/01/2016 04/27/2016 04/20/2016 04/01/2016 03/30/2016 8/33/8250  Systolic BP 539 767 341 937 902 92 409  Diastolic BP 68 83 84 84 88 59 84  Wt. (Lbs) 169 169 167.4 166 166 166 177  BMI 28.12 28.12 27.86 27.62 27.62 27.62 29.45   No flowsheet data found.    Hyperlipemia Improved with diet only Hyperlipidemia:Low fat diet discussed and encouraged.   Lipid Panel  Lab Results  Component Value Date   CHOL 174 10/11/2016   HDL 66 10/11/2016   LDLCALC 100 (H) 10/11/2016   TRIG 40 10/11/2016   CHOLHDL 2.6 10/11/2016   Nearly normal , repeat in 12 month   Foot pain, bilateral Callus on  feet responds to topical salicylate, script sent as was written in the past by dermatology  Overweight Unchanged Patient re-educated about  the importance of commitment to a  minimum of 150 minutes of exercise per week.  The importance of healthy food choices with portion control discussed. Encouraged to start a food diary, count calories and to consider  joining a support group. Sample diet sheets offered. Goals set by the patient for the next several months.   Weight /BMI 10/12/2016 07/01/2016 04/27/2016  WEIGHT 169 lb 169 lb 167 lb 6.4 oz  HEIGHT 5\' 5"  5\' 5"  5\' 5"   BMI 28.12 kg/m2 28.12  kg/m2 27.86 kg/m2      Hypothyroidism Managed by endo and controled on current med which she ia able to tolerate

## 2016-10-12 NOTE — Assessment & Plan Note (Signed)
Managed by endo and controled on current med which she ia able to tolerate

## 2016-10-12 NOTE — Patient Instructions (Addendum)
Annual exam early November, call if you need me sooner  Please get mammogram in October when due , call and schedule you appointment  Labs look very good, cholesterol almost normal and blood sugar remains normal  Please work on good  health habits so that your health will improve. 1. Commitment to daily physical activity for 30 to 60  minutes, if you are able to do this.  2. Commitment to wise food choices. Aim for half of your  food intake to be vegetable and fruit, one quarter starchy foods, and one quarter protein. Try to eat on a regular schedule  3 meals per day, snacking between meals should be limited to vegetables or fruits or small portions of nuts. 64 ounces of water per day is generally recommended, unless you have specific health conditions, like heart failure or kidney failure where you will need to limit fluid intake.  3. Commitment to sufficient and a  good quality of physical and mental rest daily, generally between 6 to 8 hours per day.  WITH PERSISTANCE AND PERSEVERANCE, THE IMPOSSIBLE , BECOMES THE NORM! It is important that you exercise regularly at least 30 minutes 5 times a week. If you develop chest pain, have severe difficulty breathing, or feel very tired, stop exercising immediately and seek medical attention   Thank you  for choosing [facility]. We consider it a privelige to serve you.  Delivering excellent health care in a caring and  compassionate way is our goal.  Partnering with you,  so that together we can achieve this goal is our strategy.

## 2016-10-13 ENCOUNTER — Encounter: Payer: Self-pay | Admitting: Family Medicine

## 2016-10-17 ENCOUNTER — Encounter: Payer: Self-pay | Admitting: Family Medicine

## 2016-11-01 DIAGNOSIS — H04123 Dry eye syndrome of bilateral lacrimal glands: Secondary | ICD-10-CM | POA: Diagnosis not present

## 2016-11-01 DIAGNOSIS — H2513 Age-related nuclear cataract, bilateral: Secondary | ICD-10-CM | POA: Diagnosis not present

## 2016-11-02 ENCOUNTER — Encounter: Payer: Self-pay | Admitting: Family Medicine

## 2016-11-03 ENCOUNTER — Other Ambulatory Visit: Payer: Self-pay

## 2016-11-03 MED ORDER — UNABLE TO FIND: 2 refills | Status: DC

## 2016-11-07 MED FILL — SALICYLIC ACID 30%/PETROLAT: 30% | 10 days supply | Qty: 100 | Fill #0

## 2016-11-09 MED FILL — TIROSINT 75 MCG CAPSULE: 75 | 84 days supply | Qty: 84 | Fill #0

## 2016-12-30 ENCOUNTER — Other Ambulatory Visit (HOSPITAL_COMMUNITY)
Admission: RE | Admit: 2016-12-30 | Discharge: 2016-12-30 | Disposition: A | Discharge status: Home / Self Care | Payer: 59 | Source: Ambulatory Visit | Attending: "Endocrinology | Admitting: "Endocrinology

## 2016-12-30 DIAGNOSIS — E038 Other specified hypothyroidism: Secondary | ICD-10-CM | POA: Diagnosis not present

## 2016-12-30 LAB — T4, FREE: Free T4: 1.18 ng/dL — ABNORMAL HIGH (ref 0.61–1.12)

## 2016-12-30 LAB — TSH: TSH: 1.958 u[IU]/mL (ref 0.350–4.500)

## 2017-01-06 ENCOUNTER — Ambulatory Visit: Payer: 59 | Admitting: "Endocrinology

## 2017-01-31 MED FILL — TIROSINT 75 MCG CAPSULE: 75 | 84 days supply | Qty: 84 | Fill #1

## 2017-02-08 ENCOUNTER — Ambulatory Visit (INDEPENDENT_AMBULATORY_CARE_PROVIDER_SITE_OTHER): Payer: 59 | Admitting: "Endocrinology

## 2017-02-08 ENCOUNTER — Encounter: Payer: Self-pay | Admitting: "Endocrinology

## 2017-02-08 VITALS — BP 132/76 | HR 81 | Ht 65.0 in | Wt 171.0 lb

## 2017-02-08 DIAGNOSIS — E038 Other specified hypothyroidism: Secondary | ICD-10-CM

## 2017-02-08 NOTE — Progress Notes (Signed)
Subjective:    Patient ID: Julie May, female    DOB: 1951-01-25, PCP Julie Helper, MD   Past Medical History:  Diagnosis Date  . Hyperlipidemia   . Hyperpotassemia   . Hypertension    new dx 11/12/2010  . OA (osteoarthritis)   . Other abnormal glucose   . Overweight(278.02)   . Polycythemia, secondary   . Routine general medical examination at a health care facility   . Trigger finger (acquired)   . Unspecified disorder of thyroid    Past Surgical History:  Procedure Laterality Date  . COLONOSCOPY N/A 03/30/2016   Procedure: COLONOSCOPY;  Surgeon: Julie Houston, MD;  Location: [facility];  Service: Endoscopy;  Laterality: N/A;  1030  . TONSILECTOMY, ADENOIDECTOMY, BILATERAL MYRINGOTOMY AND TUBES    . TONSILLECTOMY    . uterine polyp removal      Social History   Social History  . Marital status: Married    Spouse name: N/A  . Number of children: 0  . Years of education: N/A   Occupational History  . book Therapist, nutritional     Social History Main Topics  . Smoking status: Never Smoker  . Smokeless tobacco: Never Used  . Alcohol use Yes     Comment: occ.  . Drug use: No  . Sexual activity: Yes    Birth control/ protection: Post-menopausal   Other Topics Concern  . None   Social History Narrative  . None   Outpatient Encounter Prescriptions as of 02/08/2017  Medication Sig  . Levothyroxine Sodium (TIROSINT) 75 MCG CAPS Take 1 capsule (75 mcg total) by mouth daily before breakfast.  . UNABLE TO FIND Med Name: Salicylic acid 40% petroleum- apply to affected area on the feet at bedtime   No facility-administered encounter medications on file as of 02/08/2017.    ALLERGIES: Allergies  Allergen Reactions  . Aspirin Other (See Comments)    Bruising if taken daily, pt reports occasional use is not a problem, therefore technically intolerance, not allergy   VACCINATION STATUS: Immunization History  Administered Date(s) Administered  . Influenza Split  06/22/2011  . Influenza Whole 05/18/2006, 04/30/2007, 04/14/2008  . Influenza,inj,Quad PF,36+ Mos 05/31/2013, 05/28/2014, 04/14/2015, 04/27/2016  . Td 08/13/2009  . Zoster 03/23/2011    HPI  66 yr old female with medical hx of hypothyroidism x 5 yrs on Tirosint. She is here for f/u. she was kept on Tirosint 75 mcg po qday, because she did not tolerate synthroid nor levothyroxine.  -She is compliant to her medications.   Pt denies any complaints. No interval issues.  - She has steady weight after she  lost 11 pounds of weight.   Review of Systems Constitutional:  + steady weight, no fatigue, no subjective hyperthermia/hypothermia Eyes: no blurry vision, no xerophthalmia ENT: no sore throat, no nodules palpated in throat, no dysphagia/odynophagia, no hoarseness Cardiovascular: no CP/SOB/palpitations/leg swelling Respiratory: no cough/SOB Gastrointestinal: no N/V/D/C Musculoskeletal: no muscle/joint aches Skin: no rashes Neurological: no tremors/numbness/tingling/dizziness Psychiatric: no depression/anxiety  Objective:    BP 132/76   Pulse 81   Ht 5\' 5"  (1.651 m)   Wt 171 lb (77.6 kg)   BMI 28.46 kg/m   Wt Readings from Last 3 Encounters:  02/08/17 171 lb (77.6 kg)  10/12/16 169 lb (76.7 kg)  07/01/16 169 lb (76.7 kg)    Physical Exam Constitutional: overweight, in NAD Eyes: PERRLA, EOMI, no exophthalmos ENT: moist mucous membranes, palpable thyroid, no cervical lymphadenopathy Cardiovascular: RRR, No  MRG Respiratory: CTA B Gastrointestinal: abdomen soft, NT, ND, BS+ Musculoskeletal: bilateral feet deformities, strength intact in all 4 Skin: moist, warm, no rashes Neurological: no tremor with outstretched hands, DTR normal in all 4  CMP     Component Value Date/Time   NA 136 10/11/2016 0926   K 4.0 10/11/2016 0926   CL 100 (L) 10/11/2016 0926   CO2 26 10/11/2016 0926   GLUCOSE 106 (H) 10/11/2016 0926   BUN 12 10/11/2016 0926   CREATININE 0.78 10/11/2016 0926    CREATININE 1.04 11/12/2010 0904   CALCIUM 9.0 10/11/2016 0926   PROT 6.8 10/11/2016 0926   ALBUMIN 3.8 10/11/2016 0926   AST 25 10/11/2016 0926   ALT 26 10/11/2016 0926   ALKPHOS 93 10/11/2016 0926   BILITOT 0.4 10/11/2016 0926   GFRNONAA >60 10/11/2016 0926   GFRAA >60 10/11/2016 0926     Diabetic Labs (most recent): Lab Results  Component Value Date   HGBA1C 5.6 10/11/2016   HGBA1C 5.6 09/18/2015   HGBA1C 5.9 (H) 03/22/2012      Lipid Panel     Component Value Date/Time   CHOL 174 10/11/2016 0926   TRIG 40 10/11/2016 0926   HDL 66 10/11/2016 0926   CHOLHDL 2.6 10/11/2016 0926   VLDL 8 10/11/2016 0926   LDLCALC 100 (H) 10/11/2016 0926    Results for Julie May (MRN [id number]) as of 02/08/2017 10:33  Ref. Range 09/13/2016 12:02 12/30/2016 09:11  TSH Latest Ref Range: 0.350 - 4.500 uIU/mL 0.930 1.958  T4,Free(Direct) Latest Ref Range: 0.61 - 1.12 ng/dL 1.33 (H) 1.18 (H)     Assessment & Plan:   1. Other specified hypothyroidism Her Free T4 and TSH are on target . She is clinically improving. - She has tolerated and benefited from  Julie May  , and she has documented intolerance to Synthroid and levothyroxine.  I will continue  Tirosint 75 g by mouth every morning.    - We discussed about correct intake of levothyroxine, at fasting, with water, separated by at least 30 minutes from breakfast, and separated by more than 4 hours from calcium, iron, multivitamins, acid reflux medications (PPIs). -Patient is made aware of the fact that thyroid hormone replacement is needed for life, dose to be adjusted by periodic monitoring of thyroid function tests.   2. Hyperlipemia Her  lipid panel shows improving LDL to 100 from 124.  Will not need medications , I advised her on diet and exercise.   3. Prediabetes -She was diagnosed with prediabetes last visit, a1c  improved to 5.6%. Detailed information on diabetes and complications given to her. she will not need medications  for prediabetes now. I advised her to avoid simple CHO.  4. Nontoxic multinodular goiter -Her last ultrasound from 2012 showed bilateral multiple small thyroid nodules. Her repeat thyroid ultrasound   from September 2017 is essentially unchanged with no significant pathology findings..   - I advised patient to maintain close follow up with Julie Helper, MD for primary care needs. Follow up plan: Return in about 6 months (around 08/11/2017) for follow up with pre-visit labs.  Glade Lloyd, MD Phone: [phone]  Fax: [phone]   02/08/2017, 11:14 AM

## 2017-03-29 DIAGNOSIS — D225 Melanocytic nevi of trunk: Secondary | ICD-10-CM | POA: Diagnosis not present

## 2017-03-29 DIAGNOSIS — Z1283 Encounter for screening for malignant neoplasm of skin: Secondary | ICD-10-CM | POA: Diagnosis not present

## 2017-04-24 MED FILL — TIROSINT 75 MCG CAPSULE: 75 | 28 days supply | Qty: 28 | Fill #2

## 2017-04-28 ENCOUNTER — Other Ambulatory Visit: Payer: Self-pay | Admitting: Family Medicine

## 2017-04-28 DIAGNOSIS — Z1231 Encounter for screening mammogram for malignant neoplasm of breast: Secondary | ICD-10-CM

## 2017-05-12 ENCOUNTER — Ambulatory Visit (HOSPITAL_COMMUNITY)
Admission: RE | Admit: 2017-05-12 | Discharge: 2017-05-12 | Disposition: A | Discharge status: Home / Self Care | Payer: 59 | Source: Ambulatory Visit | Attending: Family Medicine | Admitting: Family Medicine

## 2017-05-12 DIAGNOSIS — Z1231 Encounter for screening mammogram for malignant neoplasm of breast: Secondary | ICD-10-CM | POA: Diagnosis not present

## 2017-05-16 ENCOUNTER — Ambulatory Visit (INDEPENDENT_AMBULATORY_CARE_PROVIDER_SITE_OTHER): Payer: 59 | Admitting: Family Medicine

## 2017-05-16 ENCOUNTER — Encounter: Payer: Self-pay | Admitting: Family Medicine

## 2017-05-16 VITALS — BP 112/80 | HR 67 | Resp 16 | Ht 65.0 in | Wt 176.1 lb

## 2017-05-16 DIAGNOSIS — E038 Other specified hypothyroidism: Secondary | ICD-10-CM | POA: Diagnosis not present

## 2017-05-16 DIAGNOSIS — M79671 Pain in right foot: Secondary | ICD-10-CM | POA: Diagnosis not present

## 2017-05-16 DIAGNOSIS — E663 Overweight: Secondary | ICD-10-CM

## 2017-05-16 DIAGNOSIS — E559 Vitamin D deficiency, unspecified: Secondary | ICD-10-CM

## 2017-05-16 DIAGNOSIS — Z23 Encounter for immunization: Secondary | ICD-10-CM

## 2017-05-16 DIAGNOSIS — E785 Hyperlipidemia, unspecified: Secondary | ICD-10-CM

## 2017-05-16 DIAGNOSIS — M79672 Pain in left foot: Secondary | ICD-10-CM

## 2017-05-16 MED ORDER — ERGOCALCIFEROL 1.25 MG (50000 UT) PO CAPS: 50000.0000 [IU] | ORAL_CAPSULE | ORAL | 1 refills | Status: DC

## 2017-05-16 MED FILL — VIT D2 1.25 MG (50,000 UNIT: 1.25 MG | 84 days supply | Qty: 12 | Fill #0

## 2017-05-16 NOTE — Patient Instructions (Addendum)
Annual physical exam April 9 or after, call if you need me before  Flu vaccine today  It is important that you exercise regularly at least 30 minutes 5 times a week. If you develop chest pain, have severe difficulty breathing, or feel very tired, stop exercising immediately and seek medical attention    Please work on good  health habits so that your health will improve. 1. Commitment to daily physical activity for 30 to 60  minutes, if you are able to do this.  2. Commitment to wise food choices. Aim for half of your  food intake to be vegetable and fruit, one quarter starchy foods, and one quarter protein. Try to eat on a regular schedule  3 meals per day, snacking between meals should be limited to vegetables or fruits or small portions of nuts. 64 ounces of water per day is generally recommended, unless you have specific health conditions, like heart failure or kidney failure where you will need to limit fluid intake.  3. Commitment to sufficient and a  good quality of physical and mental rest daily, generally between 6 to 8 hours per day.  WITH PERSISTANCE AND PERSEVERANCE, THE IMPOSSIBLE , BECOMES THE NORM!   Please focus on clean , green eating and water only  Labs April 5 or after cBC, lipid, cmp,  vit D  New is once weekly vit D for the next 6 months  Please commit to calcium 1000 to 1200 mg daily may get this with food  I will send Dr Dorris Fetch a message  Re potential drug interaction with thyroid medication BUT since no flag sent I do not thinkl this is a problem   Will address nexr dexa at next visit

## 2017-05-18 ENCOUNTER — Other Ambulatory Visit: Payer: Self-pay | Admitting: "Endocrinology

## 2017-05-18 DIAGNOSIS — E559 Vitamin D deficiency, unspecified: Secondary | ICD-10-CM | POA: Insufficient documentation

## 2017-05-18 NOTE — Assessment & Plan Note (Signed)
Managed by endocrinology.

## 2017-05-18 NOTE — Assessment & Plan Note (Signed)
Hyperlipidemia:Low fat diet discussed and encouraged.   Lipid Panel  Lab Results  Component Value Date   CHOL 174 10/11/2016   HDL 66 10/11/2016   LDLCALC 100 (H) 10/11/2016   TRIG 40 10/11/2016   CHOLHDL 2.6 10/11/2016     Updated lab needed at/ before next visit.

## 2017-05-18 NOTE — Assessment & Plan Note (Signed)
Deteriorated. Patient re-educated about  the importance of commitment to a  minimum of 150 minutes of exercise per week.  The importance of healthy food choices with portion control discussed. Encouraged to start a food diary, count calories and to consider  joining a support group. Sample diet sheets offered. Goals set by the patient for the next several months.   Weight /BMI 05/16/2017 02/08/2017 10/12/2016  WEIGHT 176 lb 1.9 oz 171 lb 169 lb  HEIGHT 5\' 5"  5\' 5"  5\' 5"   BMI 29.31 kg/m2 28.46 kg/m2 28.12 kg/m2

## 2017-05-18 NOTE — Assessment & Plan Note (Signed)
Improved with orthotics

## 2017-05-18 NOTE — Progress Notes (Signed)
   Julie May     MRN: [id number]      DOB: 1950/11/06   HPI Julie May is here for follow up and re-evaluation of chronic medical conditions, medication management and review of any available recent lab and radiology data.  Preventive health is updated, specifically  Cancer screening and Immunization.   Questions or concerns regarding consultations or procedures which the PT has had in the interim are  addressed. The PT denies any adverse reactions to current medications since the last visit.  There are no new concerns.  There are no specific complaints   ROS Denies recent fever or chills. Denies sinus pressure, nasal congestion, ear pain or sore throat. Denies chest congestion, productive cough or wheezing. Denies chest pains, palpitations and leg swelling Denies abdominal pain, nausea, vomiting,diarrhea or constipation.   Denies dysuria, frequency, hesitancy or incontinence. Denies joint pain, swelling and limitation in mobility. Denies headaches, seizures, numbness, or tingling. Denies depression, anxiety or insomnia. Denies skin break down or rash.   PE  BP 112/80   Pulse 67   Resp 16   Ht 5\' 5"  (1.651 m)   Wt 176 lb 1.9 oz (79.9 kg)   SpO2 97%   BMI 29.31 kg/m   Patient alert and oriented and in no cardiopulmonary distress.  HEENT: No facial asymmetry, EOMI,   oropharynx pink and moist.  Neck supple no JVD, no mass.  Chest: Clear to auscultation bilaterally.  CVS: S1, S2 no murmurs, no S3.Regular rate.  ABD: Soft non tender.   Ext: No edema  MS: Adequate ROM spine, shoulders, hips and knees.  Skin: Intact, no ulcerations or rash noted.  Psych: Good eye contact, normal affect. Memory intact not anxious or depressed appearing.  CNS: CN 2-12 intact, power,  normal throughout.no focal deficits noted.   Assessment & Plan  Dyslipidemia, goal LDL below 100 Hyperlipidemia:Low fat diet discussed and encouraged.   Lipid Panel  Lab Results  Component Value  Date   CHOL 174 10/11/2016   HDL 66 10/11/2016   LDLCALC 100 (H) 10/11/2016   TRIG 40 10/11/2016   CHOLHDL 2.6 10/11/2016     Updated lab needed at/ before next visit.   Overweight Deteriorated. Patient re-educated about  the importance of commitment to a  minimum of 150 minutes of exercise per week.  The importance of healthy food choices with portion control discussed. Encouraged to start a food diary, count calories and to consider  joining a support group. Sample diet sheets offered. Goals set by the patient for the next several months.   Weight /BMI 05/16/2017 02/08/2017 10/12/2016  WEIGHT 176 lb 1.9 oz 171 lb 169 lb  HEIGHT 5\' 5"  5\' 5"  5\' 5"   BMI 29.31 kg/m2 28.46 kg/m2 28.12 kg/m2      Foot pain, bilateral Improved with orthotics  Hypothyroidism Managed by endocrinology  Vitamin D deficiency Needs to commit tto weekly vit D for the next 6 monhts, will re check level at next visit

## 2017-05-18 NOTE — Assessment & Plan Note (Signed)
Needs to commit tto weekly vit D for the next 6 monhts, will re check level at next visit

## 2017-05-19 MED FILL — TIROSINT 75 MCG CAPSULE: 75 | 84 days supply | Qty: 84 | Fill #0

## 2017-06-06 ENCOUNTER — Encounter: Payer: Self-pay | Admitting: Family Medicine

## 2017-08-09 ENCOUNTER — Other Ambulatory Visit (HOSPITAL_COMMUNITY)
Admission: RE | Admit: 2017-08-09 | Discharge: 2017-08-09 | Disposition: A | Discharge status: Home / Self Care | Payer: 59 | Source: Ambulatory Visit | Attending: "Endocrinology | Admitting: "Endocrinology

## 2017-08-09 DIAGNOSIS — E038 Other specified hypothyroidism: Secondary | ICD-10-CM | POA: Diagnosis not present

## 2017-08-09 LAB — T4, FREE: Free T4: 1.06 ng/dL (ref 0.61–1.12)

## 2017-08-09 LAB — TSH: TSH: 4.985 u[IU]/mL — ABNORMAL HIGH (ref 0.350–4.500)

## 2017-08-15 ENCOUNTER — Encounter: Payer: Self-pay | Admitting: "Endocrinology

## 2017-08-15 ENCOUNTER — Ambulatory Visit (INDEPENDENT_AMBULATORY_CARE_PROVIDER_SITE_OTHER): Payer: 59 | Admitting: "Endocrinology

## 2017-08-15 VITALS — BP 134/86 | HR 71 | Ht 65.0 in | Wt 178.0 lb

## 2017-08-15 DIAGNOSIS — E038 Other specified hypothyroidism: Secondary | ICD-10-CM | POA: Diagnosis not present

## 2017-08-15 DIAGNOSIS — E782 Mixed hyperlipidemia: Secondary | ICD-10-CM | POA: Diagnosis not present

## 2017-08-15 DIAGNOSIS — R7303 Prediabetes: Secondary | ICD-10-CM | POA: Diagnosis not present

## 2017-08-15 MED ORDER — TIROSINT 88 MCG PO CAPS: 88.0000 ug | ORAL_CAPSULE | Freq: Every day | ORAL | 2 refills | Status: DC

## 2017-08-15 MED FILL — TIROSINT 88 MCG CAPS: 88 | 90 days supply | Qty: 90 | Fill #0

## 2017-08-15 NOTE — Progress Notes (Signed)
Subjective:    Patient ID: Julie May, female    DOB: [birth year], PCP Fayrene Helper, MD   Past Medical History:  Diagnosis Date  . Hyperlipidemia   . Hyperpotassemia   . Hypertension    new dx 11/12/2010  . OA (osteoarthritis)   . Other abnormal glucose   . Overweight(278.02)   . Polycythemia, secondary   . Routine general medical examination at a health care facility   . Trigger finger (acquired)   . Unspecified disorder of thyroid    Past Surgical History:  Procedure Laterality Date  . COLONOSCOPY N/A 03/30/2016   Procedure: COLONOSCOPY;  Surgeon: Rogene Houston, MD;  Location: [facility];  Service: Endoscopy;  Laterality: N/A;  1030  . TONSILECTOMY, ADENOIDECTOMY, BILATERAL MYRINGOTOMY AND TUBES    . TONSILLECTOMY    . uterine polyp removal      Social History   Socioeconomic History  . Marital status: Married    Spouse name: None  . Number of children: 0  . Years of education: None  . Highest education level: None  Social Needs  . Financial resource strain: None  . Food insecurity - worry: None  . Food insecurity - inability: None  . Transportation needs - medical: None  . Transportation needs - non-medical: None  Occupational History  . Occupation: Clinical cytogeneticist   Tobacco Use  . Smoking status: Never Smoker  . Smokeless tobacco: Never Used  Substance and Sexual Activity  . Alcohol use: Yes    Comment: occ.  . Drug use: No  . Sexual activity: Yes    Birth control/protection: Post-menopausal  Other Topics Concern  . None  Social History Narrative  . None   Outpatient Encounter Medications as of 08/15/2017  Medication Sig  . TIROSINT 88 MCG CAPS Take 1 capsule (88 mcg total) by mouth daily before breakfast.  . UNABLE TO FIND Med Name: Salicylic acid 90% petroleum- apply to affected area on the feet at bedtime  . [DISCONTINUED] ergocalciferol (VITAMIN D2) 50000 units capsule Take 1 capsule (50,000 Units total) once a week by mouth. One capsule  once weekly (Patient not taking: Reported on 08/15/2017)  . [DISCONTINUED] TIROSINT 75 MCG CAPS TAKE 1 CAPSULE BY MOUTH DAILY BEFORE BREAKFAST.   No facility-administered encounter medications on file as of 08/15/2017.    ALLERGIES: Allergies  Allergen Reactions  . Aspirin Other (See Comments)    Bruising if taken daily, pt reports occasional use is not a problem, therefore technically intolerance, not allergy   VACCINATION STATUS: Immunization History  Administered Date(s) Administered  . Influenza Split 06/22/2011  . Influenza Whole 05/18/2006, 04/30/2007, 04/14/2008  . Influenza,inj,Quad PF,6+ Mos 05/31/2013, 05/28/2014, 04/14/2015, 04/27/2016, 05/16/2017  . Td 08/13/2009  . Zoster 03/23/2011    HPI  67 yr old female with medical hx of hypothyroidism x 5 yrs on Tirosint.  - She returns for follow-up with repeat thyroid function test.  She is currently on Tirosint 75 mcg p.o. every morning.  She reports compliance.  She denies any new complaints today.   No interval issues.   Review of Systems Constitutional:  + Progressive weight gain, no fatigue, no subjective hyperthermia/hypothermia Eyes: no blurry vision, no xerophthalmia ENT: no sore throat, no nodules palpated in throat, no dysphagia/odynophagia, no hoarseness Cardiovascular: No chest pain, no palpitations, no leg swelling.  Respiratory: no cough/SOB Gastrointestinal: no N/V/D/C Musculoskeletal: no muscle/joint aches Skin: no rashes Neurological: No tremors, no dizziness.  Psychiatric: no depression/anxiety  Objective:  BP 134/86   Pulse 71   Ht 5\' 5"  (1.651 m)   Wt 178 lb (80.7 kg)   BMI 29.62 kg/m   Wt Readings from Last 3 Encounters:  08/15/17 178 lb (80.7 kg)  05/16/17 176 lb 1.9 oz (79.9 kg)  02/08/17 171 lb (77.6 kg)    Physical Exam Constitutional: + overweight, not in acute distress  Eyes: PERRLA, EOMI, no exophthalmos ENT: moist mucous membranes, palpable thyroid, no cervical  lymphadenopathy Cardiovascular: RRR, No MRG Respiratory: CTA B Gastrointestinal: abdomen soft, NT, ND, BS+ Musculoskeletal: bilateral feet deformities, strength intact in all 4 Skin: moist, warm, no rashes Neurological: No tremors of outstretched hands, DTR reflexes normal in bilateral lower extremities.     CMP     Component Value Date/Time   NA 136 10/11/2016 0926   K 4.0 10/11/2016 0926   CL 100 (L) 10/11/2016 0926   CO2 26 10/11/2016 0926   GLUCOSE 106 (H) 10/11/2016 0926   BUN 12 10/11/2016 0926   CREATININE 0.78 10/11/2016 0926   CREATININE 1.04 11/12/2010 0904   CALCIUM 9.0 10/11/2016 0926   PROT 6.8 10/11/2016 0926   ALBUMIN 3.8 10/11/2016 0926   AST 25 10/11/2016 0926   ALT 26 10/11/2016 0926   ALKPHOS 93 10/11/2016 0926   BILITOT 0.4 10/11/2016 0926   GFRNONAA >60 10/11/2016 0926   GFRAA >60 10/11/2016 0926     Diabetic Labs (most recent): Lab Results  Component Value Date   HGBA1C 5.6 10/11/2016   HGBA1C 5.6 09/18/2015   HGBA1C 5.9 (H) 03/22/2012      Lipid Panel     Component Value Date/Time   CHOL 174 10/11/2016 0926   TRIG 40 10/11/2016 0926   HDL 66 10/11/2016 0926   CHOLHDL 2.6 10/11/2016 0926   VLDL 8 10/11/2016 0926   LDLCALC 100 (H) 10/11/2016 0926    Results for DANIKAH, BUDZIK (MRN [id number]) as of 08/15/2017 13:45  Ref. Range 08/09/2017 11:23 08/09/2017 12:08  TSH Latest Ref Range: 0.350 - 4.500 uIU/mL 4.985 (H)   T4,Free(Direct) Latest Ref Range: 0.61 - 1.12 ng/dL  1.06    Assessment & Plan:   1.  hypothyroidism -Based on her thyroid function test, she will benefit from slight increase in her Tirosint dose. -Discussed and increased it to 88 mcg p.o. every morning.  - We discussed about correct intake of Tirosint, at fasting, with water, separated by at least 30 minutes from breakfast, and separated by more than 4 hours from calcium, iron, multivitamins, acid reflux medications (PPIs). -Patient is made aware of the fact that thyroid  hormone replacement is needed for life, dose to be adjusted by periodic monitoring of thyroid function tests.   2. Hyperlipemia Her  lipid panel shows improving LDL to 100 from 124.  Will not need medications , I advised her on diet and exercise.   3. Prediabetes -Her last A1c was 5.6%.   -He is not on any medications.   - -  Suggestion is made for her to avoid simple carbohydrates  from her diet including Cakes, Sweet Desserts / Pastries, Ice Cream, Soda (diet and regular), Sweet Tea, Candies, Chips, Cookies, Store Bought Juices, Alcohol in Excess of  1-2 drinks a day, Artificial Sweeteners, and "Sugar-free" Products. This will help patient to have stable blood glucose profile and potentially avoid unintended weight gain.  4. Nontoxic multinodular goiter -Her last ultrasound from 2012 showed bilateral multiple small thyroid nodules. Her repeat thyroid ultrasound   from September 2017  is essentially unchanged with no significant pathology findings..   - I advised patient to maintain close follow up with Fayrene Helper, MD for primary care needs. Follow up plan: Return in about 6 months (around 02/12/2018) for follow up with pre-visit labs.  Glade Lloyd, MD Phone: [phone]  Fax: [phone]  -  This note was partially dictated with voice recognition software. Similar sounding words can be transcribed inadequately or may not  be corrected upon review.  08/15/2017, 1:43 PM

## 2017-09-10 ENCOUNTER — Telehealth: Payer: 59 | Admitting: Family

## 2017-09-10 DIAGNOSIS — B9689 Other specified bacterial agents as the cause of diseases classified elsewhere: Secondary | ICD-10-CM

## 2017-09-10 DIAGNOSIS — J208 Acute bronchitis due to other specified organisms: Secondary | ICD-10-CM | POA: Diagnosis not present

## 2017-09-10 MED ORDER — BENZONATATE 100 MG PO CAPS: 100.0000 mg | ORAL_CAPSULE | Freq: Three times a day (TID) | ORAL | 0 refills | Status: DC | PRN

## 2017-09-10 MED ORDER — AZITHROMYCIN 250 MG PO TABS: ORAL_TABLET | ORAL | 0 refills | Status: DC

## 2017-09-10 NOTE — Progress Notes (Signed)

## 2017-10-17 ENCOUNTER — Encounter: Payer: 59 | Admitting: Family Medicine

## 2017-10-23 ENCOUNTER — Encounter: Payer: Self-pay | Admitting: Family Medicine

## 2017-10-24 ENCOUNTER — Telehealth: Payer: Self-pay

## 2017-10-24 DIAGNOSIS — E039 Hypothyroidism, unspecified: Secondary | ICD-10-CM

## 2017-10-24 DIAGNOSIS — E559 Vitamin D deficiency, unspecified: Secondary | ICD-10-CM

## 2017-10-24 DIAGNOSIS — E782 Mixed hyperlipidemia: Secondary | ICD-10-CM

## 2017-10-24 DIAGNOSIS — R7303 Prediabetes: Secondary | ICD-10-CM

## 2017-10-24 NOTE — Telephone Encounter (Signed)
Labs sent

## 2017-10-25 ENCOUNTER — Other Ambulatory Visit (HOSPITAL_COMMUNITY)
Admission: RE | Admit: 2017-10-25 | Discharge: 2017-10-25 | Disposition: A | Discharge status: Home / Self Care | Payer: 59 | Source: Ambulatory Visit | Attending: Family Medicine | Admitting: Family Medicine

## 2017-10-25 DIAGNOSIS — E782 Mixed hyperlipidemia: Secondary | ICD-10-CM | POA: Insufficient documentation

## 2017-10-25 LAB — COMPREHENSIVE METABOLIC PANEL
ALT: 27 U/L (ref 14–54)
AST: 32 U/L (ref 15–41)
Albumin: 4.5 g/dL (ref 3.5–5.0)
Alkaline Phosphatase: 94 U/L (ref 38–126)
Anion gap: 13 (ref 5–15)
BUN: 14 mg/dL (ref 6–20)
CHLORIDE: 97 mmol/L — AB (ref 101–111)
CO2: 26 mmol/L (ref 22–32)
CREATININE: 0.85 mg/dL (ref 0.44–1.00)
Calcium: 9.7 mg/dL (ref 8.9–10.3)
Glucose, Bld: 110 mg/dL — ABNORMAL HIGH (ref 65–99)
POTASSIUM: 3.8 mmol/L (ref 3.5–5.1)
SODIUM: 136 mmol/L (ref 135–145)
Total Bilirubin: 0.9 mg/dL (ref 0.3–1.2)
Total Protein: 7.6 g/dL (ref 6.5–8.1)

## 2017-10-25 LAB — CBC
HEMATOCRIT: 46 % (ref 36.0–46.0)
HEMOGLOBIN: 15.5 g/dL — AB (ref 12.0–15.0)
MCH: 28.5 pg (ref 26.0–34.0)
MCHC: 33.7 g/dL (ref 30.0–36.0)
MCV: 84.7 fL (ref 78.0–100.0)
Platelets: 247 10*3/uL (ref 150–400)
RBC: 5.43 MIL/uL — ABNORMAL HIGH (ref 3.87–5.11)
RDW: 13.3 % (ref 11.5–15.5)
WBC: 5.7 10*3/uL (ref 4.0–10.5)

## 2017-10-25 LAB — LIPID PANEL
Cholesterol: 192 mg/dL (ref 0–200)
HDL: 72 mg/dL (ref >40)
LDL CALC: 111 mg/dL — AB (ref 0–99)
TRIGLYCERIDES: 45 mg/dL (ref <150)
Total CHOL/HDL Ratio: 2.7 RATIO
VLDL: 9 mg/dL (ref 0–40)

## 2017-10-26 ENCOUNTER — Encounter: Payer: Self-pay | Admitting: Family Medicine

## 2017-10-26 ENCOUNTER — Telehealth: Payer: Self-pay

## 2017-10-26 DIAGNOSIS — R7303 Prediabetes: Secondary | ICD-10-CM

## 2017-10-26 LAB — VITAMIN D 25 HYDROXY (VIT D DEFICIENCY, FRACTURES): Vit D, 25-Hydroxy: 40.5 ng/mL (ref 30.0–100.0)

## 2017-10-26 NOTE — Telephone Encounter (Signed)
Request to add on

## 2017-10-26 NOTE — Telephone Encounter (Signed)
-----   Message from Fayrene Helper, MD sent at 10/26/2017 12:08 PM EDT ----- pls add hBA1C, dx IGT

## 2017-11-01 ENCOUNTER — Encounter: Payer: Self-pay | Admitting: Family Medicine

## 2017-11-01 ENCOUNTER — Ambulatory Visit (INDEPENDENT_AMBULATORY_CARE_PROVIDER_SITE_OTHER): Payer: 59 | Admitting: Family Medicine

## 2017-11-01 VITALS — BP 134/80 | HR 69 | Resp 16 | Ht 65.0 in | Wt 174.0 lb

## 2017-11-01 DIAGNOSIS — Z0000 Encounter for general adult medical examination without abnormal findings: Secondary | ICD-10-CM

## 2017-11-01 DIAGNOSIS — Z1211 Encounter for screening for malignant neoplasm of colon: Secondary | ICD-10-CM

## 2017-11-01 DIAGNOSIS — L84 Corns and callosities: Secondary | ICD-10-CM | POA: Diagnosis not present

## 2017-11-01 DIAGNOSIS — E663 Overweight: Secondary | ICD-10-CM

## 2017-11-01 MED ORDER — UNABLE TO FIND: 0 refills | Status: DC

## 2017-11-01 NOTE — Patient Instructions (Signed)
F/u in 6 months, please call if you need me sooner  Please calland return for Prevnar as promised  It is important that you exercise regularly at least 30 minutes 5 times a week. If you develop chest pain, have severe difficulty breathing, or feel very tired, stop exercising immediately and seek medical attention     Bad cholesterol and fasting blood sugar are stll high, please change food choice as we discussed  Stool test at home to be done this year  Please work on food choice.  Script for insoles will be sent to [facility] in due to foot shape and callus on right foot  Continue daily vitamin D 2000 IU

## 2017-11-07 DIAGNOSIS — L84 Corns and callosities: Secondary | ICD-10-CM

## 2017-11-07 HISTORY — DX: Corns and callosities: L84

## 2017-11-07 NOTE — Assessment & Plan Note (Signed)
Insole for foot prescribed due to abnotrmal shape, high instep and callus on right foot causing pain

## 2017-11-07 NOTE — Assessment & Plan Note (Signed)
Annual exam as documented. Counseling done  re healthy lifestyle involving commitment to 150 minutes exercise per week, heart healthy diet, and attaining healthy weight.The importance of adequate sleep also discussed. Changes in health habits are decided on by the patient with goals and time frames  set for achieving them. Immunization and cancer screening needs are specifically addressed at this visit. 

## 2017-11-07 NOTE — Assessment & Plan Note (Signed)
Improved. Patient re-educated about  the importance of commitment to a  minimum of 150 minutes of exercise per week.  The importance of healthy food choices with portion control discussed. Encouraged to start a food diary, count calories and to consider  joining a support group. Sample diet sheets offered. Goals set by the patient for the next several months.   Weight /BMI 11/01/2017 08/15/2017 05/16/2017  WEIGHT 174 lb 178 lb 176 lb 1.9 oz  HEIGHT 5\' 5"  5\' 5"  5\' 5"   BMI 28.96 kg/m2 29.62 kg/m2 29.31 kg/m2

## 2017-11-07 NOTE — Progress Notes (Signed)
    Julie May     MRN: [id number]      DOB: March 28, 1951  HPI: Patient is in for annual physical exam. No other health concerns are expressed or addressed at the visit. Recent labs, are reviewed. Immunization is reviewed , and  Patient is electing to hold on updates needed, unsure of shingrix and will call back for pneumonia  PE: BP 134/80   Pulse 69   Resp 16   Ht 5\' 5"  (1.651 m)   Wt 174 lb (78.9 kg)   SpO2 98%   BMI 28.96 kg/m   Pleasant  female, alert and oriented x 3, in no cardio-pulmonary distress. Afebrile. HEENT No facial trauma or asymetry. Sinuses non tender.  Extra occullar muscles intact, . External ears normal, tympanic membranes clear. Oropharynx moist, no exudate. Neck: supple, no adenopathy,JVD or thyromegaly.No bruits.  Chest: Clear to ascultation bilaterally.No crackles or wheezes. Non tender to palpation  Breast: No asymetry,no masses or lumps. No tenderness. No nipple discharge or inversion. No axillary or supraclavicular adenopathy  Cardiovascular system; Heart sounds normal,  S1 and  S2 ,no S3.  No murmur, or thrill. Apical beat not displaced Peripheral pulses normal.  Abdomen: Soft, non tender, no organomegaly or masses. No bruits. Bowel sounds normal. No guarding, tenderness or rebound.  Rectal:  Deferred, will do cologuard  GU: Asymptomatic, not examined.   Musculoskeletal exam: Full ROM of spine, hips , shoulders and knees. High arches of both feet with callus of the right foot and bunions and clawed toes. No muscle wasting or atrophy.   Neurologic: Cranial nerves 2 to 12 intact. Power, tone ,sensation and reflexes normal throughout. No disturbance in gait. No tremor.  Skin: Intact, no ulceration, erythema , scaling or rash noted. Pigmentation normal throughout  Psych; Normal mood and affect. Judgement and concentration normal   Assessment & Plan:  Annual physical exam Annual exam as documented. Counseling done  re  healthy lifestyle involving commitment to 150 minutes exercise per week, heart healthy diet, and attaining healthy weight.The importance of adequate sleep also discussed.  Changes in health habits are decided on by the patient with goals and time frames  set for achieving them. Immunization and cancer screening needs are specifically addressed at this visit.   Callus of foot Insole for foot prescribed due to abnotrmal shape, high instep and callus on right foot causing pain  Overweight Improved. Patient re-educated about  the importance of commitment to a  minimum of 150 minutes of exercise per week.  The importance of healthy food choices with portion control discussed. Encouraged to start a food diary, count calories and to consider  joining a support group. Sample diet sheets offered. Goals set by the patient for the next several months.   Weight /BMI 11/01/2017 08/15/2017 05/16/2017  WEIGHT 174 lb 178 lb 176 lb 1.9 oz  HEIGHT 5\' 5"  5\' 5"  5\' 5"   BMI 28.96 kg/m2 29.62 kg/m2 29.31 kg/m2

## 2017-11-13 MED FILL — TIROSINT 88 MCG CAPS: 88 | 90 days supply | Qty: 90 | Fill #1

## 2017-12-27 DIAGNOSIS — Z1211 Encounter for screening for malignant neoplasm of colon: Secondary | ICD-10-CM | POA: Diagnosis not present

## 2017-12-27 LAB — COLOGUARD: COLOGUARD: NEGATIVE

## 2018-01-15 ENCOUNTER — Encounter: Payer: Self-pay | Admitting: Family Medicine

## 2018-02-07 ENCOUNTER — Other Ambulatory Visit (HOSPITAL_COMMUNITY)
Admission: RE | Admit: 2018-02-07 | Discharge: 2018-02-07 | Disposition: A | Discharge status: Home / Self Care | Payer: 59 | Source: Ambulatory Visit | Attending: "Endocrinology | Admitting: "Endocrinology

## 2018-02-07 DIAGNOSIS — E038 Other specified hypothyroidism: Secondary | ICD-10-CM | POA: Insufficient documentation

## 2018-02-07 LAB — T4, FREE: Free T4: 1.17 ng/dL (ref 0.82–1.77)

## 2018-02-07 LAB — TSH: TSH: 2.969 u[IU]/mL (ref 0.350–4.500)

## 2018-02-12 MED FILL — TIROSINT 88 MCG CAPS: 88 | 90 days supply | Qty: 90 | Fill #2

## 2018-02-14 ENCOUNTER — Encounter: Payer: Self-pay | Admitting: "Endocrinology

## 2018-02-14 ENCOUNTER — Ambulatory Visit (INDEPENDENT_AMBULATORY_CARE_PROVIDER_SITE_OTHER): Payer: 59 | Admitting: "Endocrinology

## 2018-02-14 VITALS — BP 120/82 | HR 71 | Ht 65.0 in | Wt 174.0 lb

## 2018-02-14 DIAGNOSIS — E038 Other specified hypothyroidism: Secondary | ICD-10-CM

## 2018-02-14 NOTE — Progress Notes (Signed)
Endocrinology follow-up note  Subjective:    Patient ID: Julie May, female    DOB: [birth year], PCP Fayrene Helper, MD   Past Medical History:  Diagnosis Date  . Hyperlipidemia   . Hyperpotassemia   . Hypertension    new dx 11/12/2010  . OA (osteoarthritis)   . Other abnormal glucose   . Overweight(278.02)   . Polycythemia, secondary   . Routine general medical examination at a health care facility   . Trigger finger (acquired)   . Unspecified disorder of thyroid    Past Surgical History:  Procedure Laterality Date  . COLONOSCOPY N/A 03/30/2016   Procedure: COLONOSCOPY;  Surgeon: Rogene Houston, MD;  Location: [facility];  Service: Endoscopy;  Laterality: N/A;  1030  . TONSILECTOMY, ADENOIDECTOMY, BILATERAL MYRINGOTOMY AND TUBES    . TONSILLECTOMY    . uterine polyp removal      Social History   Socioeconomic History  . Marital status: Married    Spouse name: Not on file  . Number of children: 0  . Years of education: Not on file  . Highest education level: Not on file  Occupational History  . Occupation: Clinical cytogeneticist   Social Needs  . Financial resource strain: Not on file  . Food insecurity:    Worry: Not on file    Inability: Not on file  . Transportation needs:    Medical: Not on file    Non-medical: Not on file  Tobacco Use  . Smoking status: Never Smoker  . Smokeless tobacco: Never Used  Substance and Sexual Activity  . Alcohol use: Yes    Comment: occ.  . Drug use: No  . Sexual activity: Yes    Birth control/protection: Post-menopausal  Lifestyle  . Physical activity:    Days per week: Not on file    Minutes per session: Not on file  . Stress: Not on file  Relationships  . Social connections:    Talks on phone: Not on file    Gets together: Not on file    Attends religious service: Not on file    Active member of club or organization: Not on file    Attends meetings of clubs or organizations: Not on file    Relationship status: Not  on file  Other Topics Concern  . Not on file  Social History Narrative  . Not on file   Outpatient Encounter Medications as of 02/14/2018  Medication Sig  . Cholecalciferol (VITAMIN D) 2000 units CAPS Take 1 capsule by mouth daily.  Marland Kitchen TIROSINT 88 MCG CAPS Take 1 capsule (88 mcg total) by mouth daily before breakfast.  . UNABLE TO FIND Med Name: Salicylic acid 14% petroleum- apply to affected area on the feet at bedtime  . UNABLE TO FIND Bilateral insoles Dx: abnormal foot shape and callus on right foot   No facility-administered encounter medications on file as of 02/14/2018.    ALLERGIES: Allergies  Allergen Reactions  . Aspirin Other (See Comments)    Bruising if taken daily, pt reports occasional use is not a problem, therefore technically intolerance, not allergy   VACCINATION STATUS: Immunization History  Administered Date(s) Administered  . Influenza Split 06/22/2011  . Influenza Whole 05/18/2006, 04/30/2007, 04/14/2008  . Influenza,inj,Quad PF,6+ Mos 05/31/2013, 05/28/2014, 04/14/2015, 04/27/2016, 05/16/2017  . Td 08/13/2009  . Zoster 03/23/2011    HPI  67 year old female with medical history of hypothyroidism diagnosed at approximate age of 23 years, intolerant to levothyroxine and Synthroid.  Currently on Tirosint treatment -88 mcg p.o. every morning.    - She returns for follow-up with repeat thyroid function test.  She is compliant to her medications.  She does not have any new complaints today.   Review of Systems Constitutional:  + Steady weight,  no fatigue, no subjective hyperthermia/hypothermia Eyes: no blurry vision, no xerophthalmia ENT: no sore throat, no nodules palpated in throat, no dysphagia/odynophagia, no hoarseness Cardiovascular: No chest pain, no palpitations, no leg swelling.   Respiratory: no cough, no shortness of breath.   Gastrointestinal: no N/V/D/C Musculoskeletal: no muscle/joint aches Skin: no rashes Neurological: No tremors, no  dizziness.  Psychiatric: no depression/anxiety  Objective:    BP 120/82   Pulse 71   Ht 5\' 5"  (1.651 m)   Wt 174 lb (78.9 kg)   BMI 28.96 kg/m   Wt Readings from Last 3 Encounters:  02/14/18 174 lb (78.9 kg)  11/01/17 174 lb (78.9 kg)  08/15/17 178 lb (80.7 kg)    Physical Exam Constitutional: + overweight, not in acute distress.    Eyes: PERRLA, EOMI, no exophthalmos ENT: moist mucous membranes, palpable thyroid, no cervical lymphadenopathy  Musculoskeletal: +bilateral feet deformities, strength intact in all 4 Skin: moist, warm, no rashes Neurological: No tremors of outstretched hands  CMP     Component Value Date/Time   NA 136 10/25/2017 0918   K 3.8 10/25/2017 0918   CL 97 (L) 10/25/2017 0918   CO2 26 10/25/2017 0918   GLUCOSE 110 (H) 10/25/2017 0918   BUN 14 10/25/2017 0918   CREATININE 0.85 10/25/2017 0918   CREATININE 1.04 11/12/2010 0904   CALCIUM 9.7 10/25/2017 0918   PROT 7.6 10/25/2017 0918   ALBUMIN 4.5 10/25/2017 0918   AST 32 10/25/2017 0918   ALT 27 10/25/2017 0918   ALKPHOS 94 10/25/2017 0918   BILITOT 0.9 10/25/2017 0918   GFRNONAA >60 10/25/2017 0918   GFRAA >60 10/25/2017 0918     Diabetic Labs (most recent): Lab Results  Component Value Date   HGBA1C 5.6 10/11/2016   HGBA1C 5.6 09/18/2015   HGBA1C 5.9 (H) 03/22/2012      Lipid Panel     Component Value Date/Time   CHOL 192 10/25/2017 0918   TRIG 45 10/25/2017 0918   HDL 72 10/25/2017 0918   CHOLHDL 2.7 10/25/2017 0918   VLDL 9 10/25/2017 0918   LDLCALC 111 (H) 10/25/2017 0918   Results for EVELYNA, FOLKER (MRN [id number]) as of 02/14/2018 10:12  Ref. Range 02/07/2018 09:15 02/07/2018 09:16  TSH Latest Ref Range: 0.350 - 4.500 uIU/mL 2.969   T4,Free(Direct) Latest Ref Range: 0.82 - 1.77 ng/dL  1.17    Assessment & Plan:   1.  Primary hypothyroidism -Her previsit thyroid function tests are consistent with appropriate replacement. -She is advised to continue Tirosint 88 mcg p.o.  every morning.   - We discussed about correct intake of levothyroxine, at fasting, with water, separated by at least 30 minutes from breakfast, and separated by more than 4 hours from calcium, iron, multivitamins, acid reflux medications (PPIs). -Patient is made aware of the fact that thyroid hormone replacement is needed for life, dose to be adjusted by periodic monitoring of thyroid function tests.   2. Hyperlipemia Her  lipid panel shows improving LDL 111 associated with HDL of 72.  She is not on any statins.  She wishes to avoid statin treatment.    3. Prediabetes -Her last A1c was 5.6%.   -She is not on any  medications.  She will need repeat A1c before her next visit.  -  Suggestion is made for her to avoid simple carbohydrates  from her diet including Cakes, Sweet Desserts / Pastries, Ice Cream, Soda (diet and regular), Sweet Tea, Candies, Chips, Cookies, Store Bought Juices, Alcohol in Excess of  1-2 drinks a day, Artificial Sweeteners, and "Sugar-free" Products. This will help patient to have stable blood glucose profile and potentially avoid unintended weight gain.  4. Nontoxic multinodular goiter -Her last ultrasound from 2012 showed bilateral multiple small thyroid nodules. Her repeat thyroid ultrasound   from September 2017 is essentially unchanged with no significant pathology findings..   - I advised patient to maintain close follow up with Fayrene Helper, MD for primary care needs. Follow up plan: Return in about 6 months (around 08/17/2018) for Follow up with Pre-visit Labs.  Glade Lloyd, MD Phone: [phone]  Fax: [phone]  -  This note was partially dictated with voice recognition software. Similar sounding words can be transcribed inadequately or may not  be corrected upon review.  02/14/2018, 10:29 AM

## 2018-02-16 DIAGNOSIS — M216X1 Other acquired deformities of right foot: Secondary | ICD-10-CM | POA: Diagnosis not present

## 2018-02-16 DIAGNOSIS — M216X2 Other acquired deformities of left foot: Secondary | ICD-10-CM | POA: Diagnosis not present

## 2018-04-08 ENCOUNTER — Encounter: Payer: Self-pay | Admitting: Family Medicine

## 2018-04-10 ENCOUNTER — Other Ambulatory Visit: Payer: Self-pay | Admitting: Family Medicine

## 2018-04-10 DIAGNOSIS — Z1231 Encounter for screening mammogram for malignant neoplasm of breast: Secondary | ICD-10-CM

## 2018-04-13 ENCOUNTER — Ambulatory Visit (INDEPENDENT_AMBULATORY_CARE_PROVIDER_SITE_OTHER): Payer: 59

## 2018-04-13 DIAGNOSIS — Z23 Encounter for immunization: Secondary | ICD-10-CM

## 2018-05-02 ENCOUNTER — Ambulatory Visit: Payer: 59 | Admitting: Family Medicine

## 2018-05-02 ENCOUNTER — Encounter: Payer: Self-pay | Admitting: Family Medicine

## 2018-05-02 VITALS — BP 124/86 | HR 73 | Resp 12 | Ht 65.0 in | Wt 174.0 lb

## 2018-05-02 DIAGNOSIS — E559 Vitamin D deficiency, unspecified: Secondary | ICD-10-CM | POA: Diagnosis not present

## 2018-05-02 DIAGNOSIS — J0100 Acute maxillary sinusitis, unspecified: Secondary | ICD-10-CM | POA: Diagnosis not present

## 2018-05-02 DIAGNOSIS — E663 Overweight: Secondary | ICD-10-CM | POA: Diagnosis not present

## 2018-05-02 DIAGNOSIS — E038 Other specified hypothyroidism: Secondary | ICD-10-CM | POA: Diagnosis not present

## 2018-05-02 DIAGNOSIS — R7303 Prediabetes: Secondary | ICD-10-CM

## 2018-05-02 DIAGNOSIS — E782 Mixed hyperlipidemia: Secondary | ICD-10-CM | POA: Diagnosis not present

## 2018-05-02 MED ORDER — AZITHROMYCIN 250 MG PO TABS: ORAL_TABLET | ORAL | 0 refills | Status: DC

## 2018-05-02 MED FILL — AZITHROMYCIN 250 MG TABLET: 250 | 5 days supply | Qty: 6 | Fill #0

## 2018-05-02 NOTE — Assessment & Plan Note (Signed)
Hyperlipidemia:Low fat diet discussed and encouraged.   Lipid Panel  Lab Results  Component Value Date   CHOL 192 10/25/2017   HDL 72 10/25/2017   LDLCALC 111 (H) 10/25/2017   TRIG 45 10/25/2017   CHOLHDL 2.7 10/25/2017     Updated lab needed at/ before next visit.

## 2018-05-02 NOTE — Patient Instructions (Addendum)
Physical exam  April 20 or after  Nurse visit for Prevnar 13 administration on Nov 6, morning  Pls arrange wellness based on her supplemental medicare ins  Call if you need me before  Medication sent for sinus infection, Z pack  Fasting CBC, lipid, cmp and EGFr , amnd Vit D 2nd weeke April appt, get order at checkout please ( hospital draw)

## 2018-05-02 NOTE — Progress Notes (Signed)
   Julie May     MRN: [id number]      DOB: 06-18-1951   HPI Julie May is here for follow up and re-evaluation of chronic medical conditions, medication management and review of any available recent lab and radiology data.  Preventive health is updated, specifically  Cancer screening and Immunization.   2 week h/o worsening head and chest congestion, associatwith green drainage and foul taste  C/o bilateral ear pressure, denies hearing loss and sore throat. Increasing fatigue , poor appetitie and sleep disturbed by cough. No improvement with OTC medication.   ROS  Denies chest pains, palpitations and leg swelling Denies abdominal pain, nausea, vomiting,diarrhea or constipation.   Denies dysuria, frequency, hesitancy or incontinence. Denies joint pain, swelling and limitation in mobility. Denies headaches, seizures, numbness, or tingling. Denies depression, anxiety or insomnia. Denies skin break down or rash.   PE  BP 124/86 (BP Location: Right Arm, Patient Position: Sitting, Cuff Size: Large)   Pulse 73   Resp 12   Ht 5\' 5"  (1.651 m)   Wt 174 lb (78.9 kg)   SpO2 98% Comment: room air  BMI 28.96 kg/m   Patient alert and oriented and in no cardiopulmonary distress.  HEENT: No facial asymmetry, EOMI,   oropharynx pink and moist.  Neck supple no JVD, no mass.maxillary sinus tenderness, TAM clear   Chest: Clear to auscultation bilaterally.  CVS: S1, S2 no murmurs, no S3.Regular rate.  ABD: Soft non tender.   Ext: No edema  MS: Adequate ROM spine, shoulders, hips and knees.  Skin: Intact, no ulcerations or rash noted.  Psych: Good eye contact, normal affect. Memory intact not anxious or depressed appearing.  CNS: CN 2-12 intact, power,  normal throughout.no focal deficits noted.   Assessment & Plan  Acute maxillary sinusitis Z pack prescribed  Overweight Unchanged Patient re-educated about  the importance of commitment to a  minimum of 150 minutes of exercise  per week.  The importance of healthy food choices with portion control discussed. Encouraged to start a food diary, count calories and to consider  joining a support group. Sample diet sheets offered. Goals set by the patient for the next several months.   Weight /BMI 05/02/2018 02/14/2018 11/01/2017  WEIGHT 174 lb 174 lb 174 lb  HEIGHT 5\' 5"  5\' 5"  5\' 5"   BMI 28.96 kg/m2 28.96 kg/m2 28.96 kg/m2      Hypothyroidism Controlled and managed by endo  Mixed hyperlipidemia Hyperlipidemia:Low fat diet discussed and encouraged.   Lipid Panel  Lab Results  Component Value Date   CHOL 192 10/25/2017   HDL 72 10/25/2017   LDLCALC 111 (H) 10/25/2017   TRIG 45 10/25/2017   CHOLHDL 2.7 10/25/2017     Updated lab needed at/ before next visit.

## 2018-05-02 NOTE — Assessment & Plan Note (Signed)
Controlled and managed by endo 

## 2018-05-02 NOTE — Assessment & Plan Note (Signed)
Unchanged Patient re-educated about  the importance of commitment to a  minimum of 150 minutes of exercise per week.  The importance of healthy food choices with portion control discussed. Encouraged to start a food diary, count calories and to consider  joining a support group. Sample diet sheets offered. Goals set by the patient for the next several months.   Weight /BMI 05/02/2018 02/14/2018 11/01/2017  WEIGHT 174 lb 174 lb 174 lb  HEIGHT 5\' 5"  5\' 5"  5\' 5"   BMI 28.96 kg/m2 28.96 kg/m2 28.96 kg/m2

## 2018-05-02 NOTE — Assessment & Plan Note (Signed)
Z pack prescribed 

## 2018-05-09 ENCOUNTER — Other Ambulatory Visit: Payer: Self-pay | Admitting: "Endocrinology

## 2018-05-09 DIAGNOSIS — H25813 Combined forms of age-related cataract, bilateral: Secondary | ICD-10-CM | POA: Diagnosis not present

## 2018-05-09 DIAGNOSIS — H04123 Dry eye syndrome of bilateral lacrimal glands: Secondary | ICD-10-CM | POA: Diagnosis not present

## 2018-05-09 MED FILL — TIROSINT 88 MCG CAPS: 88 | 90 days supply | Qty: 90 | Fill #0

## 2018-05-16 ENCOUNTER — Ambulatory Visit (HOSPITAL_COMMUNITY)
Admission: RE | Admit: 2018-05-16 | Discharge: 2018-05-16 | Disposition: A | Discharge status: Home / Self Care | Payer: 59 | Source: Ambulatory Visit | Attending: Family Medicine | Admitting: Family Medicine

## 2018-05-16 DIAGNOSIS — Z1231 Encounter for screening mammogram for malignant neoplasm of breast: Secondary | ICD-10-CM | POA: Diagnosis not present

## 2018-08-08 MED FILL — TIROSINT 88 MCG CAPS: 88 | 30 days supply | Qty: 30 | Fill #1

## 2018-08-10 ENCOUNTER — Other Ambulatory Visit (HOSPITAL_COMMUNITY)
Admission: RE | Admit: 2018-08-10 | Discharge: 2018-08-10 | Disposition: A | Discharge status: Home / Self Care | Payer: Medicare Other | Source: Ambulatory Visit | Attending: "Endocrinology | Admitting: "Endocrinology

## 2018-08-10 DIAGNOSIS — E038 Other specified hypothyroidism: Secondary | ICD-10-CM | POA: Diagnosis not present

## 2018-08-10 LAB — TSH: TSH: 2.21 u[IU]/mL (ref 0.350–4.500)

## 2018-08-10 LAB — HEMOGLOBIN A1C
HEMOGLOBIN A1C: 5.8 % — AB (ref 4.8–5.6)
Mean Plasma Glucose: 119.76 mg/dL

## 2018-08-10 LAB — T4, FREE: FREE T4: 1.24 ng/dL (ref 0.82–1.77)

## 2018-08-17 ENCOUNTER — Encounter: Payer: Self-pay | Admitting: "Endocrinology

## 2018-08-17 ENCOUNTER — Ambulatory Visit (INDEPENDENT_AMBULATORY_CARE_PROVIDER_SITE_OTHER): Payer: Medicare Other | Admitting: "Endocrinology

## 2018-08-17 VITALS — BP 121/82 | HR 62 | Ht 65.0 in | Wt 173.0 lb

## 2018-08-17 DIAGNOSIS — E038 Other specified hypothyroidism: Secondary | ICD-10-CM | POA: Diagnosis not present

## 2018-08-17 MED ORDER — TIROSINT 88 MCG PO CAPS: 88.0000 ug | ORAL_CAPSULE | Freq: Every day | ORAL | 3 refills | Status: DC

## 2018-08-17 NOTE — Progress Notes (Signed)
Endocrinology follow-up note  Subjective:    Patient ID: Julie May, female    DOB: 1951-02-28, PCP Fayrene Helper, MD   Past Medical History:  Diagnosis Date  . Hyperlipidemia   . Hyperpotassemia   . Hypertension    new dx 11/12/2010  . OA (osteoarthritis)   . Other abnormal glucose   . Overweight(278.02)   . Polycythemia, secondary   . Routine general medical examination at a health care facility   . Trigger finger (acquired)   . Unspecified disorder of thyroid    Past Surgical History:  Procedure Laterality Date  . COLONOSCOPY N/A 03/30/2016   Procedure: COLONOSCOPY;  Surgeon: Rogene Houston, MD;  Location: [facility];  Service: Endoscopy;  Laterality: N/A;  1030  . TONSILECTOMY, ADENOIDECTOMY, BILATERAL MYRINGOTOMY AND TUBES    . TONSILLECTOMY    . uterine polyp removal      Social History   Socioeconomic History  . Marital status: Married    Spouse name: Not on file  . Number of children: 0  . Years of education: Not on file  . Highest education level: Not on file  Occupational History  . Occupation: Clinical cytogeneticist   Social Needs  . Financial resource strain: Not on file  . Food insecurity:    Worry: Not on file    Inability: Not on file  . Transportation needs:    Medical: Not on file    Non-medical: Not on file  Tobacco Use  . Smoking status: Never Smoker  . Smokeless tobacco: Never Used  Substance and Sexual Activity  . Alcohol use: Yes    Comment: occ.  . Drug use: No  . Sexual activity: Yes    Birth control/protection: Post-menopausal  Lifestyle  . Physical activity:    Days per week: Not on file    Minutes per session: Not on file  . Stress: Not on file  Relationships  . Social connections:    Talks on phone: Not on file    Gets together: Not on file    Attends religious service: Not on file    Active member of club or organization: Not on file    Attends meetings of clubs or organizations: Not on file    Relationship status: Not  on file  Other Topics Concern  . Not on file  Social History Narrative  . Not on file   Outpatient Encounter Medications as of 08/17/2018  Medication Sig  . Cholecalciferol (VITAMIN D) 2000 units CAPS Take 1 capsule by mouth daily.  Marland Kitchen TIROSINT 88 MCG CAPS Take 1 capsule (88 mcg total) by mouth daily before breakfast.  . UNABLE TO FIND Med Name: Salicylic acid 79% petroleum- apply to affected area on the feet at bedtime  . UNABLE TO FIND Bilateral insoles Dx: abnormal foot shape and callus on right foot  . [DISCONTINUED] azithromycin (ZITHROMAX) 250 MG tablet Two tablets on day one , then one tablet once daily for an additional 4 days  . [DISCONTINUED] TIROSINT 88 MCG CAPS TAKE 1 CAPSULE (88 MCG TOTAL) BY MOUTH DAILY BEFORE BREAKFAST.   No facility-administered encounter medications on file as of 08/17/2018.    ALLERGIES: Allergies  Allergen Reactions  . Aspirin Other (See Comments)    Bruising if taken daily, pt reports occasional use is not a problem, therefore technically intolerance, not allergy   VACCINATION STATUS: Immunization History  Administered Date(s) Administered  . Influenza Split 06/22/2011  . Influenza Whole 05/18/2006, 04/30/2007, 04/14/2008  .  Influenza,inj,Quad PF,6+ Mos 05/31/2013, 05/28/2014, 04/14/2015, 04/27/2016, 05/16/2017, 04/13/2018  . Td 08/13/2009  . Zoster 03/23/2011    HPI  68 year old female with medical history of hypothyroidism diagnosed at approximate age of 29 years, intolerant to levothyroxine and Synthroid.  Currently on Tirosint treatment -88 mcg p.o. every morning.   -Tolerating this medication very well with adequate clinical response. - She returns for follow-up with repeat thyroid function test.  She is compliant to her medications.  She does not have any new complaints today.   Review of Systems Constitutional:  + Steady weight,  no fatigue, no subjective hyperthermia/hypothermia Eyes: no blurry vision, no xerophthalmia ENT: no sore  throat, no nodules palpated in throat, no dysphagia/odynophagia, no hoarseness Musculoskeletal: no muscle/joint aches Skin: no rashes Neurological: No tremors, no dizziness.  Psychiatric: no depression/anxiety  Objective:    BP 121/82   Pulse 62   Ht 5\' 5"  (1.651 m)   Wt 173 lb (78.5 kg)   BMI 28.79 kg/m   Wt Readings from Last 3 Encounters:  08/17/18 173 lb (78.5 kg)  05/02/18 174 lb (78.9 kg)  02/14/18 174 lb (78.9 kg)    Physical Exam Constitutional: + overweight, not in acute distress.    Eyes: PERRLA, EOMI, no exophthalmos ENT: moist mucous membranes, palpable thyroid, no cervical lymphadenopathy  Musculoskeletal: +bilateral feet deformities, strength intact in all 4 Skin: moist, warm, no rashes Neurological: No tremors of outstretched hands  CMP     Component Value Date/Time   NA 136 10/25/2017 0918   K 3.8 10/25/2017 0918   CL 97 (L) 10/25/2017 0918   CO2 26 10/25/2017 0918   GLUCOSE 110 (H) 10/25/2017 0918   BUN 14 10/25/2017 0918   CREATININE 0.85 10/25/2017 0918   CREATININE 1.04 11/12/2010 0904   CALCIUM 9.7 10/25/2017 0918   PROT 7.6 10/25/2017 0918   ALBUMIN 4.5 10/25/2017 0918   AST 32 10/25/2017 0918   ALT 27 10/25/2017 0918   ALKPHOS 94 10/25/2017 0918   BILITOT 0.9 10/25/2017 0918   GFRNONAA >60 10/25/2017 0918   GFRAA >60 10/25/2017 0918     Diabetic Labs (most recent): Lab Results  Component Value Date   HGBA1C 5.8 (H) 08/10/2018   HGBA1C 5.6 10/11/2016   HGBA1C 5.6 09/18/2015      Lipid Panel     Component Value Date/Time   CHOL 192 10/25/2017 0918   TRIG 45 10/25/2017 0918   HDL 72 10/25/2017 0918   CHOLHDL 2.7 10/25/2017 0918   VLDL 9 10/25/2017 0918   LDLCALC 111 (H) 10/25/2017 0918   Recent Results (from the past 2160 hour(s))  TSH     Status: None   Collection Time: 08/10/18  9:21 AM  Result Value Ref Range   TSH 2.210 0.350 - 4.500 uIU/mL    Comment: Performed by a 3rd Generation assay with a functional sensitivity  of <=0.01 uIU/mL. Performed at [facility], [address], [place], [place] [address]   T4, free     Status: None   Collection Time: 08/10/18  9:21 AM  Result Value Ref Range   Free T4 1.24 0.82 - 1.77 ng/dL    Comment: (NOTE) Biotin ingestion may interfere with free T4 tests. If the results are inconsistent with the TSH level, previous test results, or the clinical presentation, then consider biotin interference. If needed, order repeat testing after stopping biotin. Performed at [facility], [place] [address] [place], [place] [address]   Hemoglobin A1c  Status: Abnormal   Collection Time: 08/10/18  9:21 AM  Result Value Ref Range   Hgb A1c MFr Bld 5.8 (H) 4.8 - 5.6 %    Comment: (NOTE) Pre diabetes:          5.7%-6.4% Diabetes:              >6.4% Glycemic control for   <7.0% adults with diabetes    Mean Plasma Glucose 119.76 mg/dL    Comment: Performed at [facility] [address] [place], [place] [address]     Assessment & Plan:   1.  Primary hypothyroidism -Her previsit labs are consistent with appropriate replacement.  She did not tolerate any other thyroid hormone preparations other than Tirosint.  She is advised to continue Tirosint 88 mcg p.o. every morning.  - We discussed about the correct intake of her thyroid hormone, on empty stomach at fasting, with water, separated by at least 30 minutes from breakfast and other medications,  and separated by more than 4 hours from calcium, iron, multivitamins, acid reflux medications (PPIs). -Patient is made aware of the fact that thyroid hormone replacement is needed for life, dose to be adjusted by periodic monitoring of thyroid function tests.   2. Hyperlipemia Her  lipid panel shows improving LDL 111 associated with HDL of 72.  She is not on any statins.  She wishes to avoid statin treatment.    3. Prediabetes -Her last A1c was 5.8%, still consistent with prediabetes.  She does not need any medications  at this time.  - Patient admits there is a room for improvement in her diet and drink choices. -  Suggestion is made for her to avoid simple carbohydrates  from her diet including Cakes, Sweet Desserts / Pastries, Ice Cream, Soda (diet and regular), Sweet Tea, Candies, Chips, Cookies, Store Bought Juices, Alcohol in Excess of  1-2 drinks a day, Artificial Sweeteners, and "Sugar-free" Products. This will help patient to have stable blood glucose profile and potentially avoid unintended weight gain.   4. Nontoxic multinodular goiter -Her last ultrasound from 2012 showed bilateral multiple small thyroid nodules. Her repeat thyroid ultrasound   from September 2017 is essentially unchanged with no significant pathology findings..   - I advised patient to maintain close follow up with Fayrene Helper, MD for primary care needs. Follow up plan: Return in about 8 months (around 04/17/2019) for Follow up with Pre-visit Labs.  Glade Lloyd, MD Phone: [phone]  Fax: [phone]  -  This note was partially dictated with voice recognition software. Similar sounding words can be transcribed inadequately or may not  be corrected upon review.  08/17/2018, 9:38 AM

## 2018-09-08 MED FILL — TIROSINT 88 MCG CAPS: 88 | 30 days supply | Qty: 30 | Fill #2 | Status: TO

## 2018-09-14 ENCOUNTER — Ambulatory Visit (INDEPENDENT_AMBULATORY_CARE_PROVIDER_SITE_OTHER): Payer: Medicare Other

## 2018-09-14 DIAGNOSIS — Z23 Encounter for immunization: Secondary | ICD-10-CM

## 2018-09-14 NOTE — Progress Notes (Signed)
Injection given with no complications in left deltoid

## 2018-09-17 ENCOUNTER — Ambulatory Visit: Payer: Medicare Other

## 2018-10-01 MED FILL — TIROSINT 88 MCG CAPS: 88 | 90 days supply | Qty: 90 | Fill #0

## 2018-11-06 ENCOUNTER — Encounter: Payer: 59 | Admitting: Family Medicine

## 2019-01-03 ENCOUNTER — Other Ambulatory Visit: Payer: Self-pay | Admitting: "Endocrinology

## 2019-01-03 MED FILL — TIROSINT 88 MCG CAPS: 88 | 90 days supply | Qty: 90 | Fill #0

## 2019-01-08 ENCOUNTER — Encounter: Payer: Self-pay | Admitting: Family Medicine

## 2019-01-23 ENCOUNTER — Encounter: Payer: Medicare Other | Admitting: Family Medicine

## 2019-04-01 ENCOUNTER — Encounter: Payer: Self-pay | Admitting: Family Medicine

## 2019-04-01 ENCOUNTER — Telehealth: Payer: Self-pay

## 2019-04-01 DIAGNOSIS — E559 Vitamin D deficiency, unspecified: Secondary | ICD-10-CM

## 2019-04-01 DIAGNOSIS — E663 Overweight: Secondary | ICD-10-CM

## 2019-04-01 DIAGNOSIS — R7303 Prediabetes: Secondary | ICD-10-CM

## 2019-04-01 DIAGNOSIS — E782 Mixed hyperlipidemia: Secondary | ICD-10-CM

## 2019-04-01 NOTE — Telephone Encounter (Signed)
Labs ordered for upcoming appt

## 2019-04-03 ENCOUNTER — Other Ambulatory Visit (HOSPITAL_COMMUNITY)
Admission: RE | Admit: 2019-04-03 | Discharge: 2019-04-03 | Disposition: A | Discharge status: Home / Self Care | Payer: Medicare Other | Source: Intra-hospital | Attending: Family Medicine | Admitting: Family Medicine

## 2019-04-03 DIAGNOSIS — R7303 Prediabetes: Secondary | ICD-10-CM | POA: Insufficient documentation

## 2019-04-03 DIAGNOSIS — E663 Overweight: Secondary | ICD-10-CM | POA: Insufficient documentation

## 2019-04-03 DIAGNOSIS — E559 Vitamin D deficiency, unspecified: Secondary | ICD-10-CM | POA: Diagnosis not present

## 2019-04-03 DIAGNOSIS — E782 Mixed hyperlipidemia: Secondary | ICD-10-CM | POA: Insufficient documentation

## 2019-04-03 LAB — COMPREHENSIVE METABOLIC PANEL
ALT: 16 U/L (ref 0–44)
AST: 21 U/L (ref 15–41)
Albumin: 4.3 g/dL (ref 3.5–5.0)
Alkaline Phosphatase: 82 U/L (ref 38–126)
Anion gap: 5 (ref 5–15)
BUN: 15 mg/dL (ref 8–23)
CO2: 31 mmol/L (ref 22–32)
Calcium: 9.4 mg/dL (ref 8.9–10.3)
Chloride: 102 mmol/L (ref 98–111)
Creatinine, Ser: 0.81 mg/dL (ref 0.44–1.00)
GFR calc Af Amer: >60 mL/min (ref >60)
GFR calc non Af Amer: >60 mL/min (ref >60)
Glucose, Bld: 109 mg/dL — ABNORMAL HIGH (ref 70–99)
Potassium: 4.3 mmol/L (ref 3.5–5.1)
Sodium: 138 mmol/L (ref 135–145)
Total Bilirubin: 0.8 mg/dL (ref 0.3–1.2)
Total Protein: 7.3 g/dL (ref 6.5–8.1)

## 2019-04-03 LAB — CBC
HCT: 46.7 % — ABNORMAL HIGH (ref 36.0–46.0)
Hemoglobin: 14.8 g/dL (ref 12.0–15.0)
MCH: 27.7 pg (ref 26.0–34.0)
MCHC: 31.7 g/dL (ref 30.0–36.0)
MCV: 87.3 fL (ref 80.0–100.0)
Platelets: 256 10*3/uL (ref 150–400)
RBC: 5.35 MIL/uL — ABNORMAL HIGH (ref 3.87–5.11)
RDW: 13.2 % (ref 11.5–15.5)
WBC: 5 10*3/uL (ref 4.0–10.5)
nRBC: 0 % (ref 0.0–0.2)

## 2019-04-03 LAB — LIPID PANEL
Cholesterol: 202 mg/dL — ABNORMAL HIGH (ref 0–200)
HDL: 68 mg/dL (ref >40)
LDL Cholesterol: 124 mg/dL — ABNORMAL HIGH (ref 0–99)
Total CHOL/HDL Ratio: 3 RATIO
Triglycerides: 52 mg/dL (ref <150)
VLDL: 10 mg/dL (ref 0–40)

## 2019-04-04 LAB — HEMOGLOBIN A1C
Hgb A1c MFr Bld: 5.8 % — ABNORMAL HIGH (ref 4.8–5.6)
Mean Plasma Glucose: 120 mg/dL

## 2019-04-04 LAB — VITAMIN D 25 HYDROXY (VIT D DEFICIENCY, FRACTURES): Vit D, 25-Hydroxy: 35.1 ng/mL (ref 30.0–100.0)

## 2019-04-04 MED FILL — TIROSINT 88 MCG CAPS: 88 | 90 days supply | Qty: 90 | Fill #1

## 2019-04-09 ENCOUNTER — Encounter: Payer: Self-pay | Admitting: Family Medicine

## 2019-04-10 ENCOUNTER — Ambulatory Visit (INDEPENDENT_AMBULATORY_CARE_PROVIDER_SITE_OTHER): Payer: Medicare Other | Admitting: Family Medicine

## 2019-04-10 ENCOUNTER — Encounter: Payer: Self-pay | Admitting: Family Medicine

## 2019-04-10 ENCOUNTER — Other Ambulatory Visit: Payer: Self-pay

## 2019-04-10 VITALS — BP 124/84 | HR 90 | Temp 98.0°F | Resp 15 | Ht 65.0 in | Wt 175.0 lb

## 2019-04-10 DIAGNOSIS — H6120 Impacted cerumen, unspecified ear: Secondary | ICD-10-CM | POA: Insufficient documentation

## 2019-04-10 DIAGNOSIS — R7303 Prediabetes: Secondary | ICD-10-CM | POA: Diagnosis not present

## 2019-04-10 DIAGNOSIS — E663 Overweight: Secondary | ICD-10-CM

## 2019-04-10 DIAGNOSIS — E782 Mixed hyperlipidemia: Secondary | ICD-10-CM | POA: Diagnosis not present

## 2019-04-10 DIAGNOSIS — Z780 Asymptomatic menopausal state: Secondary | ICD-10-CM | POA: Diagnosis not present

## 2019-04-10 DIAGNOSIS — H6123 Impacted cerumen, bilateral: Secondary | ICD-10-CM

## 2019-04-10 DIAGNOSIS — M8580 Other specified disorders of bone density and structure, unspecified site: Secondary | ICD-10-CM

## 2019-04-10 DIAGNOSIS — Z23 Encounter for immunization: Secondary | ICD-10-CM

## 2019-04-10 DIAGNOSIS — Z1231 Encounter for screening mammogram for malignant neoplasm of breast: Secondary | ICD-10-CM

## 2019-04-10 DIAGNOSIS — E038 Other specified hypothyroidism: Secondary | ICD-10-CM | POA: Diagnosis not present

## 2019-04-10 HISTORY — DX: Impacted cerumen, bilateral: H61.23

## 2019-04-10 NOTE — Assessment & Plan Note (Signed)
2 week h/o pressure feels like in a bucket, also reports mild right hearing loss since past 2 weeks, refer to ENT

## 2019-04-10 NOTE — Assessment & Plan Note (Signed)
Hyperlipidemia:Low fat diet discussed and encouraged. Needs to reduce fat in diet  Lipid Panel  Lab Results  Component Value Date   CHOL 202 (H) 04/03/2019   HDL 68 04/03/2019   LDLCALC 124 (H) 04/03/2019   TRIG 52 04/03/2019   CHOLHDL 3.0 04/03/2019

## 2019-04-10 NOTE — Assessment & Plan Note (Signed)
  Patient re-educated about  the importance of commitment to a  minimum of 150 minutes of exercise per week as able.  The importance of healthy food choices with portion control discussed, as well as eating regularly and within a 12 hour window most days. The need to choose "clean , green" food 50 to 75% of the time is discussed, as well as to make water the primary drink and set a goal of 64 ounces water daily.    Weight /BMI 04/10/2019 08/17/2018 05/02/2018  WEIGHT 175 lb 173 lb 174 lb  HEIGHT 5\' 5"  5\' 5"  5\' 5"   BMI 29.12 kg/m2 28.79 kg/m2 28.96 kg/m2

## 2019-04-10 NOTE — Assessment & Plan Note (Signed)
Patient educated about the importance of limiting  Carbohydrate intake , the need to commit to daily physical activity for a minimum of 30 minutes , and to commit weight loss. The fact that changes in all these areas will reduce or eliminate all together the development of diabetes is stressed.   Diabetic Labs Latest Ref Rng & Units 04/03/2019 08/10/2018 10/25/2017 10/11/2016 06/24/2016  HbA1c 4.8 - 5.6 % 5.8(H) 5.8(H) - 5.6 -  Chol 0 - 200 mg/dL 202(H) - 192 174 -  HDL >40 mg/dL 68 - 72 66 -  Calc LDL 0 - 99 mg/dL 124(H) - 111(H) 100(H) -  Triglycerides <150 mg/dL 52 - 45 40 -  Creatinine 0.44 - 1.00 mg/dL 0.81 - 0.85 0.78 0.91   BP/Weight 04/10/2019 08/17/2018 05/02/2018 02/14/2018 11/01/2017 08/15/2017 123456  Systolic BP A999333 123XX123 A999333 123456 Q000111Q Q000111Q [id number]  Diastolic BP 84 82 86 82 80 86 80  Wt. (Lbs) 175 173 174 174 174 178 176.12  BMI 29.12 28.79 28.96 28.96 28.96 29.62 29.31   No flowsheet data found.  Future HBA1C by Endo only

## 2019-04-10 NOTE — Assessment & Plan Note (Signed)
Repeat  dexa to re evaluate

## 2019-04-10 NOTE — Progress Notes (Signed)
Julie May     MRN: [id number]      DOB: 1951/01/08   HPI Julie May is here for follow up and re-evaluation of chronic medical conditions,  and review of any available recent lab and radiology data.  Preventive health is updated, specifically  Cancer screening and Immunization.   Has upcoming Endo appointment. The PT denies any adverse reactions to current medications since the last visit.  C/o fullness in both ears with mild hearing loss in right ear as a result in the past 2 weeks C/o intermittent left lower buttock pain , no pain actually experienced in spine, pain is relieved by advil. Denies lower extremity weakness, numbness or incontinence of stool or urine Committed to more regular exercise and better food choice , reports feels better now with reirement  ROS Denies recent fever or chills. Denies sinus pressure, nasal congestion, or sore throat. Denies chest congestion, productive cough or wheezing. Denies chest pains, palpitations and leg swelling Denies abdominal pain, nausea, vomiting,diarrhea or constipation.   Denies dysuria, frequency, hesitancy or incontinence. Denies joint pain, swelling and limitation in mobility. Denies headaches, seizures, . Denies depression, anxiety or insomnia. Denies skin break down or rash.   PE  BP 124/84   Pulse 90   Temp 98 F (36.7 C) (Temporal)   Resp 15   Ht 5\' 5"  (1.651 m)   Wt 175 lb (79.4 kg)   SpO2 99%   BMI 29.12 kg/m   Patient alert and oriented and in no cardiopulmonary distress.  HEENT: No facial asymmetry, EOMI,   oropharynx pink and moist.  Neck supple no JVD, no mass.both tM occluded by dry cerumen, right worse than left  Chest: Clear to auscultation bilaterally.  CVS: S1, S2 no murmurs, no S3.Regular rate.  ABD: Soft non tender.   Ext: No edema  MS: Adequate ROM spine, shoulders, hips and knees.No localized tenderness of spine  Skin: Intact, no ulcerations or rash noted.  Psych: Good eye contact, normal  affect. Memory intact not anxious or depressed appearing.  CNS: CN 2-12 intact, power,  normal throughout.no focal deficits noted.   Assessment & Plan  Bilateral impacted cerumen 2 week h/o pressure feels like in a bucket, also reports mild right hearing loss since past 2 weeks, refer to ENT  Osteopenia Repeat  dexa to re evaluate  Hypothyroidism Managed by Endo, has upcoming appt  Mixed hyperlipidemia Hyperlipidemia:Low fat diet discussed and encouraged. Needs to reduce fat in diet  Lipid Panel  Lab Results  Component Value Date   CHOL 202 (H) 04/03/2019   HDL 68 04/03/2019   LDLCALC 124 (H) 04/03/2019   TRIG 52 04/03/2019   CHOLHDL 3.0 04/03/2019       Overweight  Patient re-educated about  the importance of commitment to a  minimum of 150 minutes of exercise per week as able.  The importance of healthy food choices with portion control discussed, as well as eating regularly and within a 12 hour window most days. The need to choose "clean , green" food 50 to 75% of the time is discussed, as well as to make water the primary drink and set a goal of 64 ounces water daily.    Weight /BMI 04/10/2019 08/17/2018 05/02/2018  WEIGHT 175 lb 173 lb 174 lb  HEIGHT 5\' 5"  5\' 5"  5\' 5"   BMI 29.12 kg/m2 28.79 kg/m2 28.96 kg/m2      Prediabetes Patient educated about the importance of limiting  Carbohydrate intake , the  need to commit to daily physical activity for a minimum of 30 minutes , and to commit weight loss. The fact that changes in all these areas will reduce or eliminate all together the development of diabetes is stressed.   Diabetic Labs Latest Ref Rng & Units 04/03/2019 08/10/2018 10/25/2017 10/11/2016 06/24/2016  HbA1c 4.8 - 5.6 % 5.8(H) 5.8(H) - 5.6 -  Chol 0 - 200 mg/dL 202(H) - 192 174 -  HDL >40 mg/dL 68 - 72 66 -  Calc LDL 0 - 99 mg/dL 124(H) - 111(H) 100(H) -  Triglycerides <150 mg/dL 52 - 45 40 -  Creatinine 0.44 - 1.00 mg/dL 0.81 - 0.85 0.78 0.91    BP/Weight 04/10/2019 08/17/2018 05/02/2018 02/14/2018 11/01/2017 08/15/2017 123456  Systolic BP A999333 123XX123 A999333 123456 Q000111Q Q000111Q [id number]  Diastolic BP 84 82 86 82 80 86 80  Wt. (Lbs) 175 173 174 174 174 178 176.12  BMI 29.12 28.79 28.96 28.96 28.96 29.62 29.31   No flowsheet data found.  Future HBA1C by Endo only

## 2019-04-10 NOTE — Assessment & Plan Note (Signed)
Managed by Endo, has upcoming appt

## 2019-04-10 NOTE — Patient Instructions (Addendum)
WELLNESS/ WELCOME TO MEDICARE EXAM TO BE DONE BY np , PLEASE SCHEDULE FOR DUE DATE  F/U with MD in 12 months, call if you need me sooner  Please schedule mammogram and bone density test on same day if possible Flu vaccine today  Pneumonia 23 vaccine nurse visit in April 2021  You are referred for ear flush with ENT  Use tylenol for cramping pain when occurs, if not effective then the advil which has been working   Continue good health habits, labs are excellent  Thanks for choosing [facility], we consider it a privelige to serve you.

## 2019-04-12 ENCOUNTER — Other Ambulatory Visit (HOSPITAL_COMMUNITY)
Admission: RE | Admit: 2019-04-12 | Discharge: 2019-04-12 | Disposition: A | Discharge status: Home / Self Care | Payer: Medicare Other | Source: Ambulatory Visit | Attending: "Endocrinology | Admitting: "Endocrinology

## 2019-04-12 DIAGNOSIS — E038 Other specified hypothyroidism: Secondary | ICD-10-CM | POA: Insufficient documentation

## 2019-04-12 LAB — T4, FREE: Free T4: 1.35 ng/dL — ABNORMAL HIGH (ref 0.61–1.12)

## 2019-04-12 LAB — TSH: TSH: 2.921 u[IU]/mL (ref 0.350–4.500)

## 2019-04-15 ENCOUNTER — Other Ambulatory Visit: Payer: Self-pay

## 2019-04-15 ENCOUNTER — Ambulatory Visit (INDEPENDENT_AMBULATORY_CARE_PROVIDER_SITE_OTHER): Payer: Medicare Other | Admitting: "Endocrinology

## 2019-04-15 ENCOUNTER — Encounter: Payer: Self-pay | Admitting: "Endocrinology

## 2019-04-15 DIAGNOSIS — E038 Other specified hypothyroidism: Secondary | ICD-10-CM | POA: Diagnosis not present

## 2019-04-15 NOTE — Progress Notes (Signed)
04/15/2019                                Endocrinology Telehealth Visit Follow up Note -During COVID -19 Pandemic  I connected with Julie May on 04/15/2019   by telephone and verified that I am speaking with the correct person using two identifiers. Julie May, 1951-01-29. she has verbally consented to this visit. All issues noted in this document were discussed and addressed. The format was not optimal for physical exam.   Subjective:    Patient ID: Julie May, female    DOB: 11/11/1950, PCP Fayrene Helper, MD   Past Medical History:  Diagnosis Date  . Hyperlipidemia   . Hyperpotassemia   . Hypertension    new dx 11/12/2010  . OA (osteoarthritis)   . Other abnormal glucose   . Overweight(278.02)   . Polycythemia, secondary   . Routine general medical examination at a health care facility   . Trigger finger (acquired)   . Unspecified disorder of thyroid    Past Surgical History:  Procedure Laterality Date  . COLONOSCOPY N/A 03/30/2016   Procedure: COLONOSCOPY;  Surgeon: Rogene Houston, MD;  Location: [facility];  Service: Endoscopy;  Laterality: N/A;  1030  . TONSILECTOMY, ADENOIDECTOMY, BILATERAL MYRINGOTOMY AND TUBES    . TONSILLECTOMY    . uterine polyp removal      Social History   Socioeconomic History  . Marital status: Married    Spouse name: Not on file  . Number of children: 0  . Years of education: Not on file  . Highest education level: Not on file  Occupational History  . Occupation: Clinical cytogeneticist   Social Needs  . Financial resource strain: Not on file  . Food insecurity    Worry: Not on file    Inability: Not on file  . Transportation needs    Medical: Not on file    Non-medical: Not on file  Tobacco Use  . Smoking status: Never Smoker  . Smokeless tobacco: Never Used  Substance and Sexual Activity  . Alcohol use: Yes    Comment: occ.  . Drug use: No  . Sexual activity: Yes    Birth control/protection: Post-menopausal  Lifestyle  .  Physical activity    Days per week: Not on file    Minutes per session: Not on file  . Stress: Not on file  Relationships  . Social Herbalist on phone: Not on file    Gets together: Not on file    Attends religious service: Not on file    Active member of club or organization: Not on file    Attends meetings of clubs or organizations: Not on file    Relationship status: Not on file  Other Topics Concern  . Not on file  Social History Narrative  . Not on file   Outpatient Encounter Medications as of 04/15/2019  Medication Sig  . Cholecalciferol (VITAMIN D) 2000 units CAPS Take 1 capsule by mouth daily.  Marland Kitchen TIROSINT 88 MCG CAPS TAKE 1 CAPSULE BY MOUTH DAILY BEFORE BREAKFAST  . UNABLE TO FIND Med Name: Salicylic acid [id number] petroleum- apply to affected area on the feet at bedtime  . UNABLE TO FIND Bilateral insoles Dx: abnormal foot shape and callus on right foot   No facility-administered encounter medications on file as of 04/15/2019.    ALLERGIES: Allergies  Allergen  Reactions  . Aspirin Other (See Comments)    Bruising if taken daily, pt reports occasional use is not a problem, therefore technically intolerance, not allergy   VACCINATION STATUS: Immunization History  Administered Date(s) Administered  . Fluad Quad(high Dose 65+) 04/10/2019  . Influenza Split 06/22/2011  . Influenza Whole 05/18/2006, 04/30/2007, 04/14/2008  . Influenza,inj,Quad PF,6+ Mos 05/31/2013, 05/28/2014, 04/14/2015, 04/27/2016, 05/16/2017, 04/13/2018  . Pneumococcal Conjugate-13 09/14/2018  . Td 08/13/2009  . Zoster 03/23/2011    HPI  68 year old female  with medical history of hypothyroidism diagnosed at approximate age of 83 years, intolerant to levothyroxine and Synthroid.  Currently on Tirosint treatment -88 mcg p.o. every morning.   -Tolerating this medication very well with adequate clinical response. - She returns for follow-up with repeat thyroid function test.  She is compliant to  her medications.  She does not have any new complaints today.   Review of Systems Limited as above.  Objective:    There were no vitals taken for this visit.  Wt Readings from Last 3 Encounters:  04/10/19 175 lb (79.4 kg)  08/17/18 173 lb (78.5 kg)  05/02/18 174 lb (78.9 kg)    Physical Exam   CMP     Component Value Date/Time   NA 138 04/03/2019 0934   K 4.3 04/03/2019 0934   CL 102 04/03/2019 0934   CO2 31 04/03/2019 0934   GLUCOSE 109 (H) 04/03/2019 0934   BUN 15 04/03/2019 0934   CREATININE 0.81 04/03/2019 0934   CREATININE 1.04 11/12/2010 0904   CALCIUM 9.4 04/03/2019 0934   PROT 7.3 04/03/2019 0934   ALBUMIN 4.3 04/03/2019 0934   AST 21 04/03/2019 0934   ALT 16 04/03/2019 0934   ALKPHOS 82 04/03/2019 0934   BILITOT 0.8 04/03/2019 0934   GFRNONAA >60 04/03/2019 0934   GFRAA >60 04/03/2019 0934     Diabetic Labs (most recent): Lab Results  Component Value Date   HGBA1C 5.8 (H) 04/03/2019   HGBA1C 5.8 (H) 08/10/2018   HGBA1C 5.6 10/11/2016      Lipid Panel     Component Value Date/Time   CHOL 202 (H) 04/03/2019 0934   TRIG 52 04/03/2019 0934   HDL 68 04/03/2019 0934   CHOLHDL 3.0 04/03/2019 0934   VLDL 10 04/03/2019 0934   LDLCALC 124 (H) 04/03/2019 0934   Recent Results (from the past 2160 hour(s))  VITAMIN D 25 Hydroxy (Vit-D Deficiency, Fractures)     Status: None   Collection Time: 04/03/19  9:34 AM  Result Value Ref Range   Vit D, 25-Hydroxy 35.1 30.0 - 100.0 ng/mL    Comment: (NOTE) Vitamin D deficiency has been defined by the Institute of Medicine and an Endocrine Society practice guideline as a level of serum 25-OH vitamin D less than 20 ng/mL (1,2). The Endocrine Society went on to further define vitamin D insufficiency as a level between 21 and 29 ng/mL (2). 1. IOM (Institute of Medicine). 2010. Dietary reference   intakes for calcium and D. [place]: The   [employer]. 2. Holick MF, Binkley Luquillo, Bischoff-Ferrari  HA, et al.   Evaluation, treatment, and prevention of vitamin D   deficiency: an Endocrine Society clinical practice   guideline. JCEM. 2011 Jul; 96(7):1911-30. Performed At: [facility] [place], [place] [id number] Rush Farmer MD [id number]   Lipid panel     Status: Abnormal   Collection Time: 04/03/19  9:34 AM  Result Value Ref Range   Cholesterol 202 (  H) 0 - 200 mg/dL   Triglycerides 52 <150 mg/dL   HDL 68 >40 mg/dL   Total CHOL/HDL Ratio 3.0 RATIO   VLDL 10 0 - 40 mg/dL   LDL Cholesterol 124 (H) 0 - 99 mg/dL    Comment:        Total Cholesterol/HDL:CHD Risk Coronary Heart Disease Risk Table                     Men   Women  1/2 Average Risk   3.4   3.3  Average Risk       5.0   4.4  2 X Average Risk   9.6   7.1  3 X Average Risk  23.4   11.0        Use the calculated Patient Ratio above and the CHD Risk Table to determine the patient's CHD Risk.        ATP III CLASSIFICATION (LDL):  <100     mg/dL   Optimal  100-129  mg/dL   Near or Above                    Optimal  130-159  mg/dL   Borderline  160-189  mg/dL   High  >190     mg/dL   Very High Performed at [facility], [address], [place], [place] [address]   CBC     Status: Abnormal   Collection Time: 04/03/19  9:34 AM  Result Value Ref Range   WBC 5.0 4.0 - 10.5 K/uL   RBC 5.35 (H) 3.87 - 5.11 MIL/uL   Hemoglobin 14.8 12.0 - 15.0 g/dL   HCT 46.7 (H) 36.0 - 46.0 %   MCV 87.3 80.0 - 100.0 fL   MCH 27.7 26.0 - 34.0 pg   MCHC 31.7 30.0 - 36.0 g/dL   RDW 13.2 11.5 - 15.5 %   Platelets 256 150 - 400 K/uL   nRBC 0.0 0.0 - 0.2 %    Comment: Performed at [facility], [address], [place], [place] [address]  Comprehensive metabolic panel     Status: Abnormal   Collection Time: 04/03/19  9:34 AM  Result Value Ref Range   Sodium 138 135 - 145 mmol/L   Potassium 4.3 3.5 - 5.1 mmol/L   Chloride 102 98 - 111 mmol/L   CO2 31 22 - 32 mmol/L   Glucose, Bld 109 (H) 70 - 99 mg/dL   BUN  15 8 - 23 mg/dL   Creatinine, Ser 0.81 0.44 - 1.00 mg/dL   Calcium 9.4 8.9 - 10.3 mg/dL   Total Protein 7.3 6.5 - 8.1 g/dL   Albumin 4.3 3.5 - 5.0 g/dL   AST 21 15 - 41 U/L   ALT 16 0 - 44 U/L   Alkaline Phosphatase 82 38 - 126 U/L   Total Bilirubin 0.8 0.3 - 1.2 mg/dL   GFR calc non Af Amer >60 >60 mL/min   GFR calc Af Amer >60 >60 mL/min   Anion gap 5 5 - 15    Comment: Performed at [facility], [address] [place], [place] [address]  Hemoglobin A1c     Status: Abnormal   Collection Time: 04/03/19  9:35 AM  Result Value Ref Range   Hgb A1c MFr Bld 5.8 (H) 4.8 - 5.6 %    Comment: (NOTE)         Prediabetes: 5.7 - 6.4  Diabetes: >6.4         Glycemic control for adults with diabetes: <7.0    Mean Plasma Glucose 120 mg/dL    Comment: (NOTE) Performed At: [facility] [place], [place] [id number] Rush Farmer MD [id number]   T4, free     Status: Abnormal   Collection Time: 04/12/19  9:45 AM  Result Value Ref Range   Free T4 1.35 (H) 0.61 - 1.12 ng/dL    Comment: (NOTE) Biotin ingestion may interfere with free T4 tests. If the results are inconsistent with the TSH level, previous test results, or the clinical presentation, then consider biotin interference. If needed, order repeat testing after stopping biotin. Performed at [facility], [place] [address] [place], [place] [address]   TSH     Status: None   Collection Time: 04/12/19  9:45 AM  Result Value Ref Range   TSH 2.921 0.350 - 4.500 uIU/mL    Comment: Performed by a 3rd Generation assay with a functional sensitivity of <=0.01 uIU/mL. Performed at [facility], [address], [place], [place] [address]      Assessment & Plan:   1.  Primary hypothyroidism -Her previsit labs are consistent with appropriate replacement.  She did not tolerate levothyroxine or Synthroid in the past.    She is advised to continue Tirosint 88 mcg p.o. every morning.  - We discussed about the  correct intake of her thyroid hormone, on empty stomach at fasting, with water, separated by at least 30 minutes from breakfast and other medications,  and separated by more than 4 hours from calcium, iron, multivitamins, acid reflux medications (PPIs). -Patient is made aware of the fact that thyroid hormone replacement is needed for life, dose to be adjusted by periodic monitoring of thyroid function tests.    2. Hyperlipemia Her  lipid panel shows improving LDL 111 associated with HDL of 72.  She is not on any statins.  She wishes to avoid statin treatment.    3. Prediabetes -Her last A1c was 5.8%, still consistent with prediabetes.  She does not need any medications at this time. - she  admits there is a room for improvement in her diet and drink choices. -  Suggestion is made for her to avoid simple carbohydrates  from her diet including Cakes, Sweet Desserts / Pastries, Ice Cream, Soda (diet and regular), Sweet Tea, Candies, Chips, Cookies, Sweet Pastries,  Store Bought Juices, Alcohol in Excess of  1-2 drinks a day, Artificial Sweeteners, Coffee Creamer, and "Sugar-free" Products. This will help patient to have stable blood glucose profile and potentially avoid unintended weight gain.    4. Nontoxic multinodular goiter -Her last ultrasound from 2012 showed bilateral multiple small thyroid nodules. Her repeat thyroid ultrasound   from September 2017 is essentially unchanged with no significant pathology findings..   - I advised patient to maintain close follow up with Fayrene Helper, MD for primary care needs.   Time for this visit: 15 minutes. Julie May  participated in the discussions, expressed understanding, and voiced agreement with the above plans.  All questions were answered to her satisfaction. she is encouraged to contact clinic should she have any questions or concerns prior to her return visit.  Follow up plan: Return in about 6 months (around 10/14/2019) for Follow  up with Pre-visit Labs.  Glade Lloyd, MD Phone: [phone]  Fax: [phone]  -  This note was partially dictated with voice recognition software. Similar sounding words  can be transcribed inadequately or may not  be corrected upon review.  04/15/2019, 5:02 PM

## 2019-04-16 ENCOUNTER — Encounter: Payer: Self-pay | Admitting: Family Medicine

## 2019-04-16 ENCOUNTER — Ambulatory Visit (INDEPENDENT_AMBULATORY_CARE_PROVIDER_SITE_OTHER): Payer: Medicare Other | Admitting: Family Medicine

## 2019-04-16 ENCOUNTER — Other Ambulatory Visit: Payer: Self-pay

## 2019-04-16 VITALS — BP 136/78 | HR 62 | Temp 98.6°F | Resp 15 | Ht 65.0 in | Wt 174.0 lb

## 2019-04-16 DIAGNOSIS — Z0000 Encounter for general adult medical examination without abnormal findings: Secondary | ICD-10-CM | POA: Diagnosis not present

## 2019-04-16 LAB — POCT URINALYSIS DIP (CLINITEK)
Bilirubin, UA: NEGATIVE
Blood, UA: NEGATIVE
Glucose, UA: NEGATIVE mg/dL
Ketones, POC UA: NEGATIVE mg/dL
Leukocytes, UA: NEGATIVE
Nitrite, UA: NEGATIVE
POC PROTEIN,UA: NEGATIVE
Spec Grav, UA: 1.015 (ref 1.010–1.025)
Urobilinogen, UA: 0.2 E.U./dL
pH, UA: 7 (ref 5.0–8.0)

## 2019-04-16 NOTE — Progress Notes (Signed)
Subjective:    Julie May is a 68 y.o. female who presents for a Welcome to Medicare exam.   Review of Systems   Cardiac Risk Factors include: advanced age (>40men, >30 women)      Objective:    Today's Vitals   04/16/19 1134 04/16/19 1135  BP: 136/78   Pulse: 62   Resp: 15   Temp: 98.6 F (37 C)   TempSrc: Oral   SpO2: 95%   Weight: 174 lb 0.6 oz (78.9 kg)   Height: 5\' 5"  (1.651 m)   PainSc: 0-No pain 0-No pain  Body mass index is 28.96 kg/m.  Medications Outpatient Encounter Medications as of 04/16/2019  Medication Sig  . Cholecalciferol (VITAMIN D) 2000 units CAPS Take 1 capsule by mouth daily.  Marland Kitchen TIROSINT 88 MCG CAPS TAKE 1 CAPSULE BY MOUTH DAILY BEFORE BREAKFAST  . UNABLE TO FIND Med Name: Salicylic acid [id number] petroleum- apply to affected area on the feet at bedtime  . UNABLE TO FIND Bilateral insoles Dx: abnormal foot shape and callus on right foot   No facility-administered encounter medications on file as of 04/16/2019.      History: Past Medical History:  Diagnosis Date  . Hyperlipidemia   . Hyperpotassemia   . Hypertension    new dx 11/12/2010  . OA (osteoarthritis)   . Other abnormal glucose   . Overweight(278.02)   . Polycythemia, secondary   . Routine general medical examination at a health care facility   . Trigger finger (acquired)   . Unspecified disorder of thyroid    Past Surgical History:  Procedure Laterality Date  . COLONOSCOPY N/A 03/30/2016   Procedure: COLONOSCOPY;  Surgeon: Rogene Houston, MD;  [facility]: [facility];  Service: Endoscopy;  Laterality: N/A;  1030  . TONSILECTOMY, ADENOIDECTOMY, BILATERAL MYRINGOTOMY AND TUBES    . TONSILLECTOMY    . uterine polyp removal       Family History  Problem Relation Age of Onset  . Heart attack Father   . Stroke Mother   . Hypertension Mother   . Thyroid disease Sister   . Hyperlipidemia Brother   . Heart attack Brother   . Heart disease Brother   . Arthritis Other    family history   . Hypertension Other        family history   . Stroke Other        family history   . Heart disease Other        family history    Social History   Occupational History  . Occupation: Clinical cytogeneticist   Tobacco Use  . Smoking status: Never Smoker  . Smokeless tobacco: Never Used  Substance and Sexual Activity  . Alcohol use: Yes    Comment: occ.  . Drug use: No  . Sexual activity: Yes    Birth control/protection: Post-menopausal    Tobacco Counseling Counseling given: Yes   Immunizations and Health Maintenance Immunization History  Administered Date(s) Administered  . Fluad Quad(high Dose 65+) 04/10/2019  . Influenza Split 06/22/2011  . Influenza Whole 05/18/2006, 04/30/2007, 04/14/2008  . Influenza,inj,Quad PF,6+ Mos 05/31/2013, 05/28/2014, 04/14/2015, 04/27/2016, 05/16/2017, 04/13/2018  . Pneumococcal Conjugate-13 09/14/2018  . Td 08/13/2009  . Zoster 03/23/2011   There are no preventive care reminders to display for this patient.  Activities of Daily Living In your present state of health, do you have any difficulty performing the following activities: 04/16/2019  Hearing? N  Vision? N  Difficulty concentrating or  making decisions? N  Walking or climbing stairs? N  Dressing or bathing? N  Doing errands, shopping? N  Preparing Food and eating ? N  Using the Toilet? N  In the past six months, have you accidently leaked urine? N  Do you have problems with loss of bowel control? N  Managing your Medications? N  Managing your Finances? N  Housekeeping or managing your Housekeeping? N  Some recent data might be hidden    Physical Exam   (optional), or other factors deemed appropriate based on the beneficiary's medical and social history and current clinical standards.  Advanced Directives: working on them      Assessment:      Vision/Hearing screen No exam data present  Dietary issues and exercise activities discussed:  Current Exercise  Habits: Home exercise routine, Type of exercise: walking(bike, exercise bike), Time (Minutes): 30, Frequency (Times/Week): 5, Weekly Exercise (Minutes/Week): 150, Intensity: Moderate, Exercise limited by: None identified  Goals   None    Depression Screen PHQ 2/9 Scores 04/16/2019 04/10/2019 05/02/2018 11/01/2017  PHQ - 2 Score 0 0 0 0  PHQ- 9 Score - - - -     Fall Risk Fall Risk  04/16/2019  Falls in the past year? 1  Number falls in past yr: 0  Injury with Fall? 0  Follow up -    Cognitive Function:     6CIT Screen 04/16/2019  What Year? 0 points  What month? 0 points  What time? 0 points  Count back from 20 0 points  Months in reverse 0 points  Repeat phrase 0 points  Total Score 0    Patient Care Team: Fayrene Helper, MD as PCP - General     Plan:      1. Encounter for Medicare annual wellness exam   - POCT URINALYSIS DIP (CLINITEK) I have personally reviewed and noted the following in the patient's chart:   . Medical and social history . Use of alcohol, tobacco or illicit drugs  . Current medications and supplements . Functional ability and status . Nutritional status . Physical activity . Advanced directives . List of other physicians . Hospitalizations, surgeries, and ER visits in previous 12 months . Vitals . Screenings to include cognitive, depression, and falls . Referrals and appointments  In addition, I have reviewed and discussed with patient certain preventive protocols, quality metrics, and best practice recommendations. A written personalized care plan for preventive services as well as general preventive health recommendations were provided to patient.     Perlie Mayo, NP 04/16/2019

## 2019-04-16 NOTE — Patient Instructions (Addendum)
Julie May , Thank you for taking time to come for your Medicare Wellness Visit. I appreciate your ongoing commitment to your health goals. Please review the following plan we discussed and let me know if I can assist you in the future.   Please continue to practice social distancing to keep you, your family, and our community safe.  If you must go out, please wear a Mask and practice good handwashing.  Screening recommendations/referrals: Colonoscopy: Up to date Mammogram: Up to date  Bone Density: Nov/2020 Recommended yearly ophthalmology/optometry visit for glaucoma screening and checkup Recommended yearly dental visit for hygiene and checkup  Vaccinations: Influenza vaccine: Completed Pneumococcal vaccine: Needs Pneumovax 23 Tdap vaccine: Due 2021 Shingles vaccine: Completed   Advanced directives: You reported you do not have this.   Conditions/risks identified: Fall   Next appointment:  Oct 2021  Preventive Care 50 Years and Older, Female Preventive care refers to lifestyle choices and visits with your health care provider that can promote health and wellness. What does preventive care include?  A yearly physical exam. This is also called an annual well check.  Dental exams once or twice a year.  Routine eye exams. Ask your health care provider how often you should have your eyes checked.  Personal lifestyle choices, including:  Daily care of your teeth and gums.  Regular physical activity.  Eating a healthy diet.  Avoiding tobacco and drug use.  Limiting alcohol use.  Practicing safe sex.  Taking low-dose aspirin every day.  Taking vitamin and mineral supplements as recommended by your health care provider. What happens during an annual well check? The services and screenings done by your health care provider during your annual well check will depend on your age, overall health, lifestyle risk factors, and family history of disease. Counseling  Your health  care provider may ask you questions about your:  Alcohol use.  Tobacco use.  Drug use.  Emotional well-being.  Home and relationship well-being.  Sexual activity.  Eating habits.  History of falls.  Memory and ability to understand (cognition).  Work and work Statistician.  Reproductive health. Screening  You may have the following tests or measurements:  Height, weight, and BMI.  Blood pressure.  Lipid and cholesterol levels. These may be checked every 5 years, or more frequently if you are over 13 years old.  Skin check.  Lung cancer screening. You may have this screening every year starting at age 16 if you have a 30-pack-year history of smoking and currently smoke or have quit within the past 15 years.  Fecal occult blood test (FOBT) of the stool. You may have this test every year starting at age 27.  Flexible sigmoidoscopy or colonoscopy. You may have a sigmoidoscopy every 5 years or a colonoscopy every 10 years starting at age 37.  Hepatitis C blood test.  Hepatitis B blood test.  Sexually transmitted disease (STD) testing.  Diabetes screening. This is done by checking your blood sugar (glucose) after you have not eaten for a while (fasting). You may have this done every 1-3 years.  Bone density scan. This is done to screen for osteoporosis. You may have this done starting at age 72.  Mammogram. This may be done every 1-2 years. Talk to your health care provider about how often you should have regular mammograms. Talk with your health care provider about your test results, treatment options, and if necessary, the need for more tests. Vaccines  Your health care provider may recommend certain  vaccines, such as:  Influenza vaccine. This is recommended every year.  Tetanus, diphtheria, and acellular pertussis (Tdap, Td) vaccine. You may need a Td booster every 10 years.  Zoster vaccine. You may need this after age 3.  Pneumococcal 13-valent conjugate  (PCV13) vaccine. One dose is recommended after age 76.  Pneumococcal polysaccharide (PPSV23) vaccine. One dose is recommended after age 48. Talk to your health care provider about which screenings and vaccines you need and how often you need them. This information is not intended to replace advice given to you by your health care provider. Make sure you discuss any questions you have with your health care provider. Document Released: 07/24/2015 Document Revised: 03/16/2016 Document Reviewed: 04/28/2015 Elsevier Interactive Patient Education  2017 [place] Prevention in the Home Falls can cause injuries. They can happen to people of all ages. There are many things you can do to make your home safe and to help prevent falls. What can I do on the outside of my home?  Regularly fix the edges of walkways and driveways and fix any cracks.  Remove anything that might make you trip as you walk through a door, such as a raised step or threshold.  Trim any bushes or trees on the path to your home.  Use bright outdoor lighting.  Clear any walking paths of anything that might make someone trip, such as rocks or tools.  Regularly check to see if handrails are loose or broken. Make sure that both sides of any steps have handrails.  Any raised decks and porches should have guardrails on the edges.  Have any leaves, snow, or ice cleared regularly.  Use sand or salt on walking paths during winter.  Clean up any spills in your garage right away. This includes oil or grease spills. What can I do in the bathroom?  Use night lights.  Install grab bars by the toilet and in the tub and shower. Do not use towel bars as grab bars.  Use non-skid mats or decals in the tub or shower.  If you need to sit down in the shower, use a plastic, non-slip stool.  Keep the floor dry. Clean up any water that spills on the floor as soon as it happens.  Remove soap buildup in the tub or shower  regularly.  Attach bath mats securely with double-sided non-slip rug tape.  Do not have throw rugs and other things on the floor that can make you trip. What can I do in the bedroom?  Use night lights.  Make sure that you have a light by your bed that is easy to reach.  Do not use any sheets or blankets that are too big for your bed. They should not hang down onto the floor.  Have a firm chair that has side arms. You can use this for support while you get dressed.  Do not have throw rugs and other things on the floor that can make you trip. What can I do in the kitchen?  Clean up any spills right away.  Avoid walking on wet floors.  Keep items that you use a lot in easy-to-reach places.  If you need to reach something above you, use a strong step stool that has a grab bar.  Keep electrical cords out of the way.  Do not use floor polish or wax that makes floors slippery. If you must use wax, use non-skid floor wax.  Do not have throw rugs and other things  on the floor that can make you trip. What can I do with my stairs?  Do not leave any items on the stairs.  Make sure that there are handrails on both sides of the stairs and use them. Fix handrails that are broken or loose. Make sure that handrails are as long as the stairways.  Check any carpeting to make sure that it is firmly attached to the stairs. Fix any carpet that is loose or worn.  Avoid having throw rugs at the top or bottom of the stairs. If you do have throw rugs, attach them to the floor with carpet tape.  Make sure that you have a light switch at the top of the stairs and the bottom of the stairs. If you do not have them, ask someone to add them for you. What else can I do to help prevent falls?  Wear shoes that:  Do not have high heels.  Have rubber bottoms.  Are comfortable and fit you well.  Are closed at the toe. Do not wear sandals.  If you use a stepladder:  Make sure that it is fully  opened. Do not climb a closed stepladder.  Make sure that both sides of the stepladder are locked into place.  Ask someone to hold it for you, if possible.  Clearly mark and make sure that you can see:  Any grab bars or handrails.  First and last steps.  Where the edge of each step is.  Use tools that help you move around (mobility aids) if they are needed. These include:  Canes.  Walkers.  Scooters.  Crutches.  Turn on the lights when you go into a dark area. Replace any light bulbs as soon as they burn out.  Set up your furniture so you have a clear path. Avoid moving your furniture around.  If any of your floors are uneven, fix them.  If there are any pets around you, be aware of where they are.  Review your medicines with your doctor. Some medicines can make you feel dizzy. This can increase your chance of falling. Ask your doctor what other things that you can do to help prevent falls. This information is not intended to replace advice given to you by your health care provider. Make sure you discuss any questions you have with your health care provider. Document Released: 04/23/2009 Document Revised: 12/03/2015 Document Reviewed: 08/01/2014 Elsevier Interactive Patient Education  2017 [employer].

## 2019-04-17 ENCOUNTER — Ambulatory Visit: Payer: Medicare Other | Admitting: "Endocrinology

## 2019-05-13 ENCOUNTER — Ambulatory Visit (INDEPENDENT_AMBULATORY_CARE_PROVIDER_SITE_OTHER): Payer: Medicare Other | Admitting: Otolaryngology

## 2019-05-13 DIAGNOSIS — H6123 Impacted cerumen, bilateral: Secondary | ICD-10-CM

## 2019-05-13 DIAGNOSIS — H903 Sensorineural hearing loss, bilateral: Secondary | ICD-10-CM

## 2019-05-23 ENCOUNTER — Ambulatory Visit (HOSPITAL_COMMUNITY)
Admission: RE | Admit: 2019-05-23 | Discharge: 2019-05-23 | Disposition: A | Discharge status: Home / Self Care | Payer: Medicare Other | Source: Ambulatory Visit | Attending: Family Medicine | Admitting: Family Medicine

## 2019-05-23 ENCOUNTER — Other Ambulatory Visit: Payer: Self-pay

## 2019-05-23 DIAGNOSIS — Z1231 Encounter for screening mammogram for malignant neoplasm of breast: Secondary | ICD-10-CM | POA: Diagnosis not present

## 2019-05-23 DIAGNOSIS — M8580 Other specified disorders of bone density and structure, unspecified site: Secondary | ICD-10-CM | POA: Insufficient documentation

## 2019-05-23 DIAGNOSIS — M85851 Other specified disorders of bone density and structure, right thigh: Secondary | ICD-10-CM | POA: Diagnosis not present

## 2019-05-23 DIAGNOSIS — Z780 Asymptomatic menopausal state: Secondary | ICD-10-CM

## 2019-05-26 ENCOUNTER — Encounter: Payer: Self-pay | Admitting: Family Medicine

## 2019-07-01 MED FILL — TIROSINT 88 MCG CAPS: 88 | 90 days supply | Qty: 90 | Fill #0

## 2019-08-23 DIAGNOSIS — Z23 Encounter for immunization: Secondary | ICD-10-CM | POA: Diagnosis not present

## 2019-09-20 DIAGNOSIS — Z23 Encounter for immunization: Secondary | ICD-10-CM | POA: Diagnosis not present

## 2019-09-23 ENCOUNTER — Encounter: Payer: Self-pay | Admitting: Family Medicine

## 2019-10-01 ENCOUNTER — Other Ambulatory Visit: Payer: Self-pay | Admitting: "Endocrinology

## 2019-10-02 ENCOUNTER — Ambulatory Visit (INDEPENDENT_AMBULATORY_CARE_PROVIDER_SITE_OTHER): Payer: Medicare Other | Admitting: Family Medicine

## 2019-10-02 ENCOUNTER — Other Ambulatory Visit: Payer: Self-pay

## 2019-10-02 ENCOUNTER — Encounter: Payer: Self-pay | Admitting: Family Medicine

## 2019-10-02 VITALS — BP 140/88 | HR 99 | Temp 97.1°F | Resp 15 | Ht 65.0 in | Wt 173.0 lb

## 2019-10-02 DIAGNOSIS — M7918 Myalgia, other site: Secondary | ICD-10-CM | POA: Diagnosis not present

## 2019-10-02 MED FILL — TIROSINT 88 MCG CAPS: 88 | 90 days supply | Qty: 90 | Fill #0

## 2019-10-02 NOTE — Progress Notes (Signed)
Subjective:  Patient ID: Julie May, female    DOB: 1951/04/12  Age: 69 y.o. MRN: [id number]  CC:  Chief Complaint  Patient presents with  . Sciatica    more so in right side, started 3 months ago after working on flooring in home       HPI  HPI   Julie May is a 69 year old female patient of Dr. Griffin Dakin.  Who presents today with ongoing right buttock discomfort.  Reports 3 months ago after doing some working on the floor she started having some discomfort in the right buttock sometimes the left.  The predominantly on the right.  She reports it gets better as the day progresses so it is worse in the morning.  She now reports that is more of a dull/tooth achy pain and it goes down the leg.  She reports she has had some what she describes as a foot weakness/foot drop but that went away after she rested and she is not have a problem since.  She denies having any cauda equina symptoms.  Denies having any changes in bowel or bladder habits.  She denies having any injury to the back and/or buttocks or hip area.  She reports that she is not sure what truly aggravates it.  But knows that Tylenol, Advil, heating pads can help it when she needs it.  But she does have to have something every day.  Pain today is a 3 out of 10 and with it being intermittent she declines having any medication for it.  Is open and receptive to doing physical therapy she has done some therapy like moves after researching online but reports that is just been ongoing so she felt that she should be seen by provider.  Today patient denies signs and symptoms of COVID 19 infection including fever, chills, cough, shortness of breath, and headache. Past Medical, Surgical, Social History, Allergies, and Medications have been Reviewed.   Past Medical History:  Diagnosis Date  . Hyperlipidemia   . Hyperpotassemia   . Hypertension    new dx 11/12/2010  . OA (osteoarthritis)   . Other abnormal glucose   . Overweight(278.02)    . Polycythemia, secondary   . Routine general medical examination at a health care facility   . Trigger finger (acquired)   . Unspecified disorder of thyroid     Current Meds  Medication Sig  . Cholecalciferol (VITAMIN D) 2000 units CAPS Take 1 capsule by mouth daily.  Marland Kitchen TIROSINT 88 MCG CAPS TAKE 1 CAPSULE (88 MCG TOTAL) BY MOUTH DAILY BEFORE BREAKFAST.  Marland Kitchen UNABLE TO FIND Bilateral insoles Dx: abnormal foot shape and callus on right foot    ROS:  Review of Systems  Constitutional: Negative.   HENT: Negative.   Eyes: Negative.   Respiratory: Negative.   Cardiovascular: Negative.   Gastrointestinal: Negative.   Genitourinary: Negative.   Musculoskeletal: Negative.        See HPI  Skin: Negative.   Neurological: Negative.   Endo/Heme/Allergies: Negative.   Psychiatric/Behavioral: Negative.   All other systems reviewed and are negative.    Objective:   Today's Vitals: BP 140/88   Pulse 99   Temp (!) 97.1 F (36.2 C) (Temporal)   Resp 15   Ht 5\' 5"  (1.651 m)   Wt 173 lb (78.5 kg)   SpO2 99%   BMI 28.79 kg/m  Vitals with BMI 10/02/2019 04/16/2019 04/10/2019  Height 5\' 5"  5\' 5"  5\' 5"   Weight 173  lbs 174 lbs 1 oz 175 lbs  BMI 28.79 [id number]  Systolic [id number]  Diastolic 88 78 84  Pulse 99 62 90     Physical Exam Vitals and nursing note reviewed.  Constitutional:      Appearance: Normal appearance. She is well-developed and well-groomed. She is obese.  HENT:     Head: Normocephalic and atraumatic.     Right Ear: External ear normal.     Left Ear: External ear normal.     Mouth/Throat:     Comments: Mask in place Eyes:     General:        Right eye: No discharge.        Left eye: No discharge.     Conjunctiva/sclera: Conjunctivae normal.  Cardiovascular:     Rate and Rhythm: Normal rate and regular rhythm.     Pulses: Normal pulses.     Heart sounds: Normal heart sounds.  Pulmonary:     Effort: Pulmonary effort is normal.     Breath sounds: Normal  breath sounds.  Musculoskeletal:        General: Normal range of motion.     Cervical back: Normal range of motion and neck supple.     Right hip: Normal.     Left hip: Normal.     Right upper leg: Normal.     Left upper leg: Normal.     Right knee: Normal.     Left knee: Normal.     Right lower leg: Normal.     Left lower leg: Normal.     Right ankle: Normal.     Left ankle: Normal.     Right foot: Normal.     Left foot: Normal.  Skin:    General: Skin is warm.  Neurological:     General: No focal deficit present.     Mental Status: She is alert and oriented to person, place, and time.  Psychiatric:        Attention and Perception: Attention normal.        Mood and Affect: Mood normal.        Speech: Speech normal.        Behavior: Behavior normal. Behavior is cooperative.        Thought Content: Thought content normal.        Cognition and Memory: Cognition normal.        Judgment: Judgment normal.      Assessment   1. Right buttock pain     Tests ordered Orders Placed This Encounter  Procedures  . Ambulatory referral to Physical Therapy     Plan: Please see assessment and plan per problem list above.   No orders of the defined types were placed in this encounter.   Patient to follow-up in as scheduled  Perlie Mayo, NP

## 2019-10-02 NOTE — Patient Instructions (Signed)
I appreciate the opportunity to provide you with care for your health and wellness. Today we discussed: sciatica vs muscle/low back issue  Follow up: as scheduled   No labs   Referral for PT today  Please continue to practice social distancing to keep you, your family, and our community safe.  If you must go out, please wear a mask and practice good handwashing.  It was a pleasure to see you and I look forward to continuing to work together on your health and well-being. Please do not hesitate to call the office if you need care or have questions about your care.  Have a wonderful day and week. With Gratitude, Cherly Beach, DNP, AGNP-BC

## 2019-10-02 NOTE — Assessment & Plan Note (Signed)
Questionable sciatica versus piriformis muscle syndrome.  Referral to physical therapy to help identify and provide with exercises to help relief.  If physical therapy does not provide relief would consider doing x-rays and/or referral for both.

## 2019-10-08 ENCOUNTER — Other Ambulatory Visit: Payer: Self-pay

## 2019-10-08 ENCOUNTER — Ambulatory Visit (HOSPITAL_COMMUNITY): Payer: Medicare Other | Attending: Family Medicine | Admitting: Physical Therapy

## 2019-10-08 DIAGNOSIS — M5441 Lumbago with sciatica, right side: Secondary | ICD-10-CM | POA: Insufficient documentation

## 2019-10-08 DIAGNOSIS — M5442 Lumbago with sciatica, left side: Secondary | ICD-10-CM | POA: Diagnosis not present

## 2019-10-08 DIAGNOSIS — R262 Difficulty in walking, not elsewhere classified: Secondary | ICD-10-CM | POA: Insufficient documentation

## 2019-10-08 DIAGNOSIS — M6281 Muscle weakness (generalized): Secondary | ICD-10-CM | POA: Diagnosis not present

## 2019-10-08 NOTE — Therapy (Signed)
[place], [place], [address] Phone: [phone]   Fax:  [phone]  Physical Therapy Evaluation  Patient Details  Name: Julie May MRN: [id number] Date of Birth: Nov 16, 1950 Referring Provider (PT): Cherly Beach   Encounter Date: 10/08/2019  PT End of Session - 10/08/19 1104    Visit Number  1    Number of Visits  12    Date for PT Re-Evaluation  11/19/19    Authorization Type  Medicare with BCBS    Authorization Time Period  POC dates 10/08/19 to 11/19/2019    Progress Note Due on Visit  10    PT Start Time  0915    PT Stop Time  1000    PT Time Calculation (min)  45 min    Activity Tolerance  Patient tolerated treatment well    Behavior During Therapy  [facility] for tasks assessed/performed       Past Medical History:  Diagnosis Date  . Bilateral impacted cerumen 04/10/2019  . Callus of foot 11/07/2017  . Foot pain, bilateral 05/23/2013  . Hyperlipidemia   . Hyperpotassemia   . Hypertension    new dx 11/12/2010  . OA (osteoarthritis)   . Other abnormal glucose   . Overweight(278.02)   . Polycythemia, secondary   . Routine general medical examination at a health care facility   . THYROID NODULE 01/18/2010   Qualifier: Diagnosis of  By: Loanne Drilling MD, Jacelyn Pi   . Trigger finger (acquired)   . Unspecified disorder of thyroid   . 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 COLONOSCOPY N/A 03/30/2016   Procedure: COLONOSCOPY;  Surgeon: Rogene Houston, MD;  [facility];  Service: Endoscopy;  Laterality: N/A;  1030  . TONSILECTOMY, ADENOIDECTOMY, BILATERAL MYRINGOTOMY AND TUBES    . TONSILLECTOMY    . uterine polyp removal       There were no vitals filed for this visit.   Subjective Assessment - 10/08/19 0923    Subjective  Patient complains of butt/hip pain that started about 3 months ago. States that she was filling in the cracks in the floor at their  new home they are building and was sitting on her butt and scooting on the floor. States that she then had a sharp pain in her right hip that has not let up. States walking makes it worse. States that see to walk a few miles a day but hasn't lately as she doesn't know if it is hurting it. Reports that she was been doing some piriformis stretches and they seem to be helping a bit. Reports that when she walks sometime she has pain in her butt that then extends down her thighs and sometimes her foot feels funny. Reports that she is can use the elliptical without difficulty. The right side hurts more than her left but it changes sides. Complains of cramping in both legs as well. No bowel/bladder changes.    Limitations  Standing;Walking;Lifting    Patient Stated Goals  to have less pain and to return to walking program    Currently in Pain?  Yes    Pain Score  2     Pain Location  Hip    Pain Orientation  Right    Pain Descriptors / Indicators  Nagging;Dull    Pain Type  Acute pain    Pain Radiating Towards  sometimes doing legs    Pain  Onset  More than a month ago    Pain Frequency  Constant    Aggravating Factors   standing and walking    Pain Relieving Factors  heat, stretches, eliptical, medication         [facility] PT Assessment - 10/08/19 0001      Assessment   Medical Diagnosis  R buttocks pain    Referring Provider (PT)  Cherly Beach    Prior Therapy  none      Precautions   Precautions  None      Restrictions   Weight Bearing Restrictions  No      Balance Screen   Has the patient fallen in the past 6 months  No      [place] residence    Available Help at Discharge  Family    Type of [place]      Prior Function   Level of [place]  Retired    Leisure  renovating home      Cognition   Overall Cognitive Status  Within Functional Limits for tasks assessed      ROM / Strength   AROM / PROM / Strength   AROM;Strength      AROM   AROM Assessment Site  Lumbar;Hip    Right/Left Hip  Left;Right    Right Hip Flexion  120   pain in hip    Right Hip External Rotation   50    Right Hip Internal Rotation   5   pain in hip   Left Hip Flexion  120    Left Hip External Rotation   50    Left Hip Internal Rotation   20    Lumbar Flexion  50% limited   no pain    Lumbar Extension  75% limited    no increase in symptom s   Lumbar - Right Side Bend  75% limited   increase right hip pain , favors flexion    Lumbar - Left Side Bend  50% limited   no pain    Lumbar - Right Rotation  75% limited   increase right hip pain , favors flexion    Lumbar - Left Rotation  50% limited      Strength   Strength Assessment Site  Hip;Knee    Right/Left Hip  Right;Left    Right Hip Extension  4-/5   pain in butt   Right Hip ABduction  4-/5    Right Hip ADduction  4/5    Left Hip Extension  4/5   no pain in butt   Left Hip ABduction  4/5    Left Hip ADduction  4/5    Right/Left Knee  Right;Left    Right Knee Flexion  4-/5   cramping    Left Knee Extension  4-/5   cramping      Palpation   Spinal mobility  hypomobility noted in lumbar spine with spring testing - no pain     Palpation comment  no significant tenderness noted in low back or hip muscualture      Special Tests   Other special tests  neg SLR test and slump test                Objective measurements completed on examination: See above findings.      [facility] Adult PT Treatment/Exercise - 10/08/19 0001      Exercises  Exercises  Lumbar      Lumbar Exercises: Standing   Other Standing Lumbar Exercises  glute isometrics - tactile cues - x5 5" holds B - in standign, sitting and prone       Lumbar Exercises: Prone   Other Prone Lumbar Exercises  hgamstring curls 3x5, 5" holds              PT Education - 10/08/19 1117    Education Details  on HEP, on current condition and eval findings.    Person(s) Educated   Patient    Methods  Explanation    Comprehension  Verbalized understanding       PT Short Term Goals - 10/08/19 1114      PT SHORT TERM GOAL #1   Title  Patient will be independent in self management strategies to improve quality of life and functional outcomes.    Time  3    Period  Weeks    Status  New    Target Date  10/29/19      PT SHORT TERM GOAL #2   Title  Patient will report at least 25% improvement in overall symptoms and/or functional ability    Time  3    Period  Weeks    Status  New    Target Date  10/29/19        PT Long Term Goals - 10/08/19 1114      PT LONG TERM GOAL #1   Title  Patient will report at least 50% improvement in overall symptoms and/or functional ability    Time  6    Period  Weeks    Status  New    Target Date  11/19/19      PT LONG TERM GOAL #2   Title  Patient will be able perform lumbar mobility without increase in lumbar symptoms to improve gross lumbar mobility.    Time  6    Period  Weeks    Status  New    Target Date  11/19/19      PT LONG TERM GOAL #3   Title  Patient will be able to ambulate daily without limitation or difficulty from hip pain to return to prior level of function.    Time  6    Period  Weeks    Status  New    Target Date  11/19/19             Plan - 10/08/19 1109    Clinical Impression Statement  Patient complains of right hip pain that radiates down posterior thigh and sometimes presents on left side. Patient presents with posterior chain weakness and secondary compensatory movement strategies due to these weakness that are contributing to current condition. Lumbar spine does not seem to be involved but will continue to assess possible lumbar involvement in futures sessions. Patient would benefit from skilled physical therapy to improve motor function and improve functional strength to return patient to optimal function.    Personal Factors and Comorbidities  Comorbidity 1;Comorbidity 2     Comorbidities  arthritis, high arches/ feet in supination    Examination-Activity Limitations  Bend;Transfers;Stand;Stairs;Squat;Lift    Examination-Participation Restrictions  Yard Work;Community Activity    Stability/Clinical Decision Making  Stable/Uncomplicated    Clinical Decision Making  Low    Rehab Potential  Good    PT Frequency  2x / week    PT Duration  6 weeks    PT Treatment/Interventions  ADLs/Self Care Home Management;Aquatic  Therapy;Biofeedback;Electrical Stimulation;Moist Heat;Traction;Balance training;Therapeutic exercise;Therapeutic activities;Functional mobility training;Stair training;Gait training;DME Instruction;Neuromuscular re-education;Patient/family education;Manual techniques;Dry needling;Passive range of motion    PT Next Visit Plan  FOTO, quad lengthening, hamstring/glute activation and strengthening, balance work for feet (ambulates on lateral aspects of feet). R Hip ER stretches and IR mobility along with lumbar mobility. trial traction adn open up right lower trunk    PT Home Exercise Plan  3/30 glutes set, prone knee bends    Consulted and Agree with Plan of Care  Patient       Patient will benefit from skilled therapeutic intervention in order to improve the following deficits and impairments:  Abnormal gait, Decreased endurance, Pain, Decreased activity tolerance, Decreased strength, Decreased balance, Decreased mobility, Difficulty walking, Decreased range of motion, Postural dysfunction  Visit Diagnosis: Difficulty in walking, not elsewhere classified  Acute midline low back pain with bilateral sciatica  Muscle weakness (generalized)     Problem List Patient Active Problem List   Diagnosis Date Noted  . Pain in right buttock 10/02/2019  . Prediabetes 08/15/2017  . Vitamin D deficiency 05/18/2017  . 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 Osteopenia 04/04/2015  . Hypothyroidism 01/18/2010  . Mixed hyperlipidemia 12/17/2009  . Overweight  09/13/2007   11:29 AM, 10/08/19 Jerene Pitch, DPT Physical Therapy with [facility]  [phone] office  [place] [address] [place], [place], [address] Phone: [phone]   Fax:  [phone]  Name: Julie May MRN: [id number] Date of Birth: [birth year]

## 2019-10-09 ENCOUNTER — Other Ambulatory Visit (HOSPITAL_COMMUNITY)
Admission: RE | Admit: 2019-10-09 | Discharge: 2019-10-09 | Disposition: A | Discharge status: Home / Self Care | Payer: Medicare Other | Source: Ambulatory Visit | Attending: "Endocrinology | Admitting: "Endocrinology

## 2019-10-09 DIAGNOSIS — E038 Other specified hypothyroidism: Secondary | ICD-10-CM | POA: Diagnosis not present

## 2019-10-09 LAB — TSH: TSH: 2.188 u[IU]/mL (ref 0.350–4.500)

## 2019-10-09 LAB — T4, FREE: Free T4: 1.29 ng/dL — ABNORMAL HIGH (ref 0.61–1.12)

## 2019-10-15 ENCOUNTER — Other Ambulatory Visit: Payer: Self-pay

## 2019-10-15 ENCOUNTER — Encounter (HOSPITAL_COMMUNITY): Payer: Self-pay | Admitting: Physical Therapy

## 2019-10-15 ENCOUNTER — Ambulatory Visit (INDEPENDENT_AMBULATORY_CARE_PROVIDER_SITE_OTHER): Payer: Medicare Other | Admitting: "Endocrinology

## 2019-10-15 ENCOUNTER — Encounter: Payer: Self-pay | Admitting: "Endocrinology

## 2019-10-15 ENCOUNTER — Ambulatory Visit (HOSPITAL_COMMUNITY): Payer: Medicare Other | Attending: Family Medicine | Admitting: Physical Therapy

## 2019-10-15 DIAGNOSIS — M6281 Muscle weakness (generalized): Secondary | ICD-10-CM | POA: Diagnosis not present

## 2019-10-15 DIAGNOSIS — R7303 Prediabetes: Secondary | ICD-10-CM

## 2019-10-15 DIAGNOSIS — R262 Difficulty in walking, not elsewhere classified: Secondary | ICD-10-CM | POA: Diagnosis not present

## 2019-10-15 DIAGNOSIS — M5442 Lumbago with sciatica, left side: Secondary | ICD-10-CM | POA: Insufficient documentation

## 2019-10-15 DIAGNOSIS — M5441 Lumbago with sciatica, right side: Secondary | ICD-10-CM | POA: Diagnosis not present

## 2019-10-15 DIAGNOSIS — E038 Other specified hypothyroidism: Secondary | ICD-10-CM | POA: Diagnosis not present

## 2019-10-15 MED ORDER — TIROSINT 75 MCG PO CAPS: 75.0000 ug | ORAL_CAPSULE | Freq: Every day | ORAL | 1 refills | Status: DC

## 2019-10-15 NOTE — Progress Notes (Signed)
10/15/2019                                    Endocrinology Telehealth Visit Follow up Note -During COVID -19 Pandemic  I connected with Julie May on 10/15/2019   by telephone and verified that I am speaking with the correct person using two identifiers. Julie May, May 13, 1951. she has verbally consented to this visit. All issues noted in this document were discussed and addressed. The format was not optimal for physical exam.    Subjective:    Patient ID: Julie May, female    DOB: 1951/04/23, PCP Fayrene Helper, MD   Past Medical History:  Diagnosis Date  . Bilateral impacted cerumen 04/10/2019  . Callus of foot 11/07/2017  . Foot pain, bilateral 05/23/2013  . Hyperlipidemia   . Hyperpotassemia   . Hypertension    new dx 11/12/2010  . OA (osteoarthritis)   . Other abnormal glucose   . Overweight(278.02)   . Polycythemia, secondary   . Routine general medical examination at a health care facility   . THYROID NODULE 01/18/2010   Qualifier: Diagnosis of  By: Loanne Drilling MD, Jacelyn Pi   . Trigger finger (acquired)   . Unspecified disorder of thyroid   . 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 COLONOSCOPY N/A 03/30/2016   Procedure: COLONOSCOPY;  Surgeon: Rogene Houston, MD;  [facility];  Service: Endoscopy;  Laterality: N/A;  1030  . TONSILECTOMY, ADENOIDECTOMY, BILATERAL MYRINGOTOMY AND TUBES    . TONSILLECTOMY    . uterine polyp removal      Social History   Socioeconomic History  . Marital status: Married    Spouse name: Not on file  . Number of children: 0  . Years of education: Not on file  . Highest education level: Not on file  Occupational History  . Occupation: Clinical cytogeneticist   Tobacco Use  . Smoking status: Never Smoker  . Smokeless tobacco: Never Used  Substance and Sexual Activity  . Alcohol use: Yes    Comment: occ.  . Drug use: No  . Sexual activity: Yes    Birth control/protection: Post-menopausal   Other Topics Concern  . Not on file  Social History Narrative  . Not on file   Social Determinants of Health   Financial Resource Strain: Low Risk   . Difficulty of Paying Living Expenses: Not hard at all  Food Insecurity: No Food Insecurity  . Worried About Charity fundraiser in the Last Year: Never true  . Ran Out of Food in the Last Year: Never true  Transportation Needs: No Transportation Needs  . Lack of Transportation (Medical): No  . Lack of Transportation (Non-Medical): No  Physical Activity: Sufficiently Active  . Days of Exercise per Week: 5 days  . Minutes of Exercise per Session: 30 min  Stress: No Stress Concern Present  . Feeling of Stress : Not at all  Social Connections: Slightly Isolated  . Frequency of Communication with Friends and Family: Three times a week  . Frequency of Social Gatherings with Friends and Family: Three times a week  . Attends Religious Services: More than 4 times per year  . Active Member of Clubs or Organizations: No  . Attends Archivist Meetings: Never  . Marital Status: Married   Outpatient Encounter Medications as of 10/15/2019  Medication Sig  . Cholecalciferol (VITAMIN D) 2000 units CAPS Take 1 capsule by mouth daily.  Marland Kitchen TIROSINT 75 MCG CAPS Take 1 capsule (75 mcg total) by mouth daily before breakfast.  . UNABLE TO FIND Med Name: Salicylic acid [id number] petroleum- apply to affected area on the feet at bedtime  . UNABLE TO FIND Bilateral insoles Dx: abnormal foot shape and callus on right foot  . [DISCONTINUED] TIROSINT 88 MCG CAPS TAKE 1 CAPSULE (88 MCG TOTAL) BY MOUTH DAILY BEFORE BREAKFAST.   No facility-administered encounter medications on file as of 10/15/2019.   ALLERGIES: No Active Allergies VACCINATION STATUS: Immunization History  Administered Date(s) Administered  . Fluad Quad(high Dose 65+) 04/10/2019  . Influenza Split 06/22/2011  . Influenza Whole 05/18/2006, 04/30/2007, 04/14/2008  . Influenza,inj,Quad  PF,6+ Mos 05/31/2013, 05/28/2014, 04/14/2015, 04/27/2016, 05/16/2017, 04/13/2018  . Pneumococcal Conjugate-13 09/14/2018  . Td 08/13/2009  . Zoster 03/23/2011    HPI  69 year old female  with medical history of hypothyroidism diagnosed at approximate age of 36 years, intolerant to levothyroxine and Synthroid.  She is currently on Tirosint 88 mcg p.o. daily before breakfast.  She continues to feel better.  Her previsit labs indicate a slight over replacement.   She does not have any new complaints today.   Review of Systems Limited as above.  Objective:    There were no vitals taken for this visit.  Wt Readings from Last 3 Encounters:  10/02/19 173 lb (78.5 kg)  04/16/19 174 lb 0.6 oz (78.9 kg)  04/10/19 175 lb (79.4 kg)    Physical Exam   CMP     Component Value Date/Time   NA 138 04/03/2019 0934   K 4.3 04/03/2019 0934   CL 102 04/03/2019 0934   CO2 31 04/03/2019 0934   GLUCOSE 109 (H) 04/03/2019 0934   BUN 15 04/03/2019 0934   CREATININE 0.81 04/03/2019 0934   CREATININE 1.04 11/12/2010 0904   CALCIUM 9.4 04/03/2019 0934   PROT 7.3 04/03/2019 0934   ALBUMIN 4.3 04/03/2019 0934   AST 21 04/03/2019 0934   ALT 16 04/03/2019 0934   ALKPHOS 82 04/03/2019 0934   BILITOT 0.8 04/03/2019 0934   GFRNONAA >60 04/03/2019 0934   GFRAA >60 04/03/2019 0934     Diabetic Labs (most recent): Lab Results  Component Value Date   HGBA1C 5.8 (H) 04/03/2019   HGBA1C 5.8 (H) 08/10/2018   HGBA1C 5.6 10/11/2016      Lipid Panel     Component Value Date/Time   CHOL 202 (H) 04/03/2019 0934   TRIG 52 04/03/2019 0934   HDL 68 04/03/2019 0934   CHOLHDL 3.0 04/03/2019 0934   VLDL 10 04/03/2019 0934   LDLCALC 124 (H) 04/03/2019 0934   Recent Results (from the past 2160 hour(s))  TSH     Status: None   Collection Time: 10/09/19  9:41 AM  Result Value Ref Range   TSH 2.188 0.350 - 4.500 uIU/mL    Comment: Performed by a 3rd Generation assay with a functional sensitivity of  <=0.01 uIU/mL. Performed at [facility], [address] [place], [place] [address]   T4, free     Status: Abnormal   Collection Time: 10/09/19  9:42 AM  Result Value Ref Range   Free T4 1.29 (H) 0.61 - 1.12 ng/dL    Comment: (NOTE) Biotin ingestion may interfere with free T4 tests. If the results are inconsistent with the TSH level, previous test results, or the clinical presentation, then consider biotin interference.  If needed, order repeat testing after stopping biotin. Performed at [facility], [place] [address], [place], [place] [address]      Assessment & Plan:   1.  Primary hypothyroidism -Her previsit labs are consistent with slight over replacement.  I discussed and lowered her she was not to 75 mcg daily before breakfast.   She did not tolerate levothyroxine or Synthroid in the past.     - We discussed about the correct intake of her thyroid hormone, on empty stomach at fasting, with water, separated by at least 30 minutes from breakfast and other medications,  and separated by more than 4 hours from calcium, iron, multivitamins, acid reflux medications (PPIs). -Patient is made aware of the fact that thyroid hormone replacement is needed for life, dose to be adjusted by periodic monitoring of thyroid function tests.   2. Hyperlipemia Her  lipid panel shows improving LDL 111 associated with HDL of 72.  She is not on any statins.  She wishes to avoid statin treatment.    3. Prediabetes -Her last A1c was 5.8%, still consistent with prediabetes.  She does not need any medications at this time. - she  admits there is a room for improvement in her diet and drink choices. -  Suggestion is made for her to avoid simple carbohydrates  from her diet including Cakes, Sweet Desserts / Pastries, Ice Cream, Soda (diet and regular), Sweet Tea, Candies, Chips, Cookies, Sweet Pastries,  Store Bought Juices, Alcohol in Excess of  1-2 drinks a day, Artificial Sweeteners, Coffee Creamer,  and "Sugar-free" Products. This will help patient to have stable blood glucose profile and potentially avoid unintended weight gain.   4. Nontoxic multinodular goiter -Her last ultrasound from 2012 showed bilateral multiple small thyroid nodules. Her repeat thyroid ultrasound   from September 2017 is essentially unchanged with no significant pathology findings..   - I advised patient to maintain close follow up with Fayrene Helper, MD for primary care needs.      - Time spent on this patient care encounter:  25 minutes of which 50% was spent in  counseling and the rest reviewing  her current and  previous labs / studies and medications  doses and developing a plan for long term care. Julie May  participated in the discussions, expressed understanding, and voiced agreement with the above plans.  All questions were answered to her satisfaction. she is encouraged to contact clinic should she have any questions or concerns prior to her return visit.   Follow up plan: Return in about 6 months (around 04/15/2020) for Follow up with Pre-visit Labs.  Glade Lloyd, MD Phone: [phone]  Fax: [phone]  -  This note was partially dictated with voice recognition software. Similar sounding words can be transcribed inadequately or may not  be corrected upon review.  10/15/2019, 12:58 PM

## 2019-10-15 NOTE — Therapy (Signed)
[place], [place], [address] Phone: [phone]   Fax:  [phone]  Physical Therapy Treatment  Patient Details  Name: Julie May MRN: [id number] Date of Birth: 12-May-1951 Referring Provider (PT): Cherly Beach   Encounter Date: 10/15/2019  PT End of Session - 10/15/19 1446    Visit Number  2    Number of Visits  12    Date for PT Re-Evaluation  11/19/19    Authorization Type  Medicare with BCBS    Authorization Time Period  POC dates 10/08/19 to 11/19/2019    Progress Note Due on Visit  10    PT Start Time  1446    PT Stop Time  1526    PT Time Calculation (min)  40 min    Activity Tolerance  Patient tolerated treatment well    Behavior During Therapy  [facility] for tasks assessed/performed       Past Medical History:  Diagnosis Date  . Bilateral impacted cerumen 04/10/2019  . Callus of foot 11/07/2017  . Foot pain, bilateral 05/23/2013  . Hyperlipidemia   . Hyperpotassemia   . Hypertension    new dx 11/12/2010  . OA (osteoarthritis)   . Other abnormal glucose   . Overweight(278.02)   . Polycythemia, secondary   . Routine general medical examination at a health care facility   . THYROID NODULE 01/18/2010   Qualifier: Diagnosis of  By: Loanne Drilling MD, Jacelyn Pi   . Trigger finger (acquired)   . Unspecified disorder of thyroid   . 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 COLONOSCOPY N/A 03/30/2016   Procedure: COLONOSCOPY;  Surgeon: Rogene Houston, MD;  [facility];  Service: Endoscopy;  Laterality: N/A;  1030  . TONSILECTOMY, ADENOIDECTOMY, BILATERAL MYRINGOTOMY AND TUBES    . TONSILLECTOMY    . uterine polyp removal       There were no vitals filed for this visit.  Subjective Assessment - 10/15/19 1515    Subjective  feeling a little better and only feeling it in the right hip today 2/10 and yesterday it felt pretty good. States she tried bridging and  she can't do it like she used to but its better then it was. States she feels like she has a little more control over her legs. States is it no longer waking her up at night and she is not feeling it like she was when she turns over.    Limitations  Standing;Walking;Lifting    Patient Stated Goals  to have less pain and to return to walking program    Currently in Pain?  Yes    Pain Score  2     Pain Location  Hip    Pain Orientation  Right    Pain Descriptors / Indicators  Discomfort    Pain Onset  More than a month ago         [facility] PT Assessment - 10/15/19 0001      Assessment   Medical Diagnosis  R buttocks pain    Referring Provider (PT)  Cherly Beach                   [facility] Adult PT Treatment/Exercise - 10/15/19 0001      Lumbar Exercises: Standing   Other Standing Lumbar Exercises  self mobilization to piriformis and hip abd with lacrosse ball at wall - 4 minutes  Lumbar Exercises: Seated   Other Seated Lumbar Exercises  self mobilization with rolling stick - 3 minutes       Lumbar Exercises: Supine   Clam  15 reps;2 seconds   1x 15 L, 3x10 R in side lying    Bridge  10 reps;5 seconds    Other Supine Lumbar Exercises  butterfly stretch - 2x5 10" holds       Lumbar Exercises: Sidelying   Clam  Right;5 reps;2 seconds;Left   5 sets    Clam Limitations  reverse               PT Short Term Goals - 10/08/19 1114      PT SHORT TERM GOAL #1   Title  Patient will be independent in self management strategies to improve quality of life and functional outcomes.    Time  3    Period  Weeks    Status  New    Target Date  10/29/19      PT SHORT TERM GOAL #2   Title  Patient will report at least 25% improvement in overall symptoms and/or functional ability    Time  3    Period  Weeks    Status  New    Target Date  10/29/19        PT Long Term Goals - 10/08/19 1114      PT LONG TERM GOAL #1   Title  Patient will report at least 50%  improvement in overall symptoms and/or functional ability    Time  6    Period  Weeks    Status  New    Target Date  11/19/19      PT LONG TERM GOAL #2   Title  Patient will be able perform lumbar mobility without increase in lumbar symptoms to improve gross lumbar mobility.    Time  6    Period  Weeks    Status  New    Target Date  11/19/19      PT LONG TERM GOAL #3   Title  Patient will be able to ambulate daily without limitation or difficulty from hip pain to return to prior level of function.    Time  6    Period  Weeks    Status  New    Target Date  11/19/19            Plan - 10/15/19 1446    Clinical Impression Statement  Patient already improving in functional mobility with glute squeeze being most beneficial for radicular symptoms. Added hip strengthening exercises and clamshells seemed to target area of continued pain (lateral aspect of hip). Unable to palpate any discomfort in this area and self massage reproduced radicular symptoms so this was not added to home program. Will continue to progress hip strengthening as tolerated.    Personal Factors and Comorbidities  Comorbidity 1;Comorbidity 2    Comorbidities  arthritis, high arches/ feet in supination    Examination-Activity Limitations  Bend;Transfers;Stand;Stairs;Squat;Lift    Examination-Participation Restrictions  Yard Work;Community Activity    Stability/Clinical Decision Making  Stable/Uncomplicated    Rehab Potential  Good    PT Frequency  2x / week    PT Duration  6 weeks    PT Treatment/Interventions  ADLs/Self Care Home Management;Aquatic Therapy;Biofeedback;Electrical Stimulation;Moist Heat;Traction;Balance training;Therapeutic exercise;Therapeutic activities;Functional mobility training;Stair training;Gait training;DME Instruction;Neuromuscular re-education;Patient/family education;Manual techniques;Dry needling;Passive range of motion    PT Next Visit Plan  FOTO, quad lengthening, hamstring/glute  activation  and strengthening, balance work for feet (ambulates on lateral aspects of feet). R Hip ER stretches and IR mobility along with lumbar mobility. trial traction adn open up right lower trunk    PT Home Exercise Plan  3/30 glutes set, prone knee bends; 4/6 clamshells, bridges, thomas stretch,    Consulted and Agree with Plan of Care  Patient       Patient will benefit from skilled therapeutic intervention in order to improve the following deficits and impairments:  Abnormal gait, Decreased endurance, Pain, Decreased activity tolerance, Decreased strength, Decreased balance, Decreased mobility, Difficulty walking, Decreased range of motion, Postural dysfunction  Visit Diagnosis: Difficulty in walking, not elsewhere classified  Acute midline low back pain with bilateral sciatica  Muscle weakness (generalized)     Problem List Patient Active Problem List   Diagnosis Date Noted  . Pain in right buttock 10/02/2019  . Prediabetes 08/15/2017  . Vitamin D deficiency 05/18/2017  . 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 Osteopenia 04/04/2015  . Hypothyroidism 01/18/2010  . Mixed hyperlipidemia 12/17/2009  . Overweight 09/13/2007   3:39 PM, 10/15/19 Jerene Pitch, DPT Physical Therapy with [facility]  [phone] office  [place] [address] [place], [place], [address] Phone: [phone]   Fax:  [phone]  Name: Julie May MRN: [id number] Date of Birth: Mar 18, 1951

## 2019-10-17 ENCOUNTER — Encounter (HOSPITAL_COMMUNITY): Payer: Self-pay | Admitting: Physical Therapy

## 2019-10-18 ENCOUNTER — Ambulatory Visit (HOSPITAL_COMMUNITY): Payer: Medicare Other | Admitting: Physical Therapy

## 2019-10-18 ENCOUNTER — Other Ambulatory Visit: Payer: Self-pay

## 2019-10-18 ENCOUNTER — Encounter (HOSPITAL_COMMUNITY): Payer: Self-pay | Admitting: Physical Therapy

## 2019-10-18 DIAGNOSIS — M5442 Lumbago with sciatica, left side: Secondary | ICD-10-CM

## 2019-10-18 DIAGNOSIS — M6281 Muscle weakness (generalized): Secondary | ICD-10-CM | POA: Diagnosis not present

## 2019-10-18 DIAGNOSIS — R262 Difficulty in walking, not elsewhere classified: Secondary | ICD-10-CM | POA: Diagnosis not present

## 2019-10-18 DIAGNOSIS — M5441 Lumbago with sciatica, right side: Secondary | ICD-10-CM

## 2019-10-18 NOTE — Therapy (Signed)
[place], [place], [address] Phone: [phone]   Fax:  [phone]  Physical Therapy Treatment  Patient Details  Name: Julie May MRN: [id number] Date of Birth: 10/11/1950 Referring Provider (PT): Cherly Beach   Encounter Date: 10/18/2019  PT End of Session - 10/18/19 0830    Visit Number  3    Number of Visits  12    Date for PT Re-Evaluation  11/19/19    Authorization Type  Medicare with BCBS    Authorization Time Period  POC dates 10/08/19 to 11/19/2019    Progress Note Due on Visit  10    PT Start Time  0830    PT Stop Time  0910    PT Time Calculation (min)  40 min    Activity Tolerance  Patient tolerated treatment well    Behavior During Therapy  [facility] for tasks assessed/performed       Past Medical History:  Diagnosis Date  . Bilateral impacted cerumen 04/10/2019  . Callus of foot 11/07/2017  . Foot pain, bilateral 05/23/2013  . Hyperlipidemia   . Hyperpotassemia   . Hypertension    new dx 11/12/2010  . OA (osteoarthritis)   . Other abnormal glucose   . Overweight(278.02)   . Polycythemia, secondary   . Routine general medical examination at a health care facility   . THYROID NODULE 01/18/2010   Qualifier: Diagnosis of  By: Loanne Drilling MD, Jacelyn Pi   . Trigger finger (acquired)   . Unspecified disorder of thyroid   . 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 COLONOSCOPY N/A 03/30/2016   Procedure: COLONOSCOPY;  Surgeon: Rogene Houston, MD;  [facility];  Service: Endoscopy;  Laterality: N/A;  1030  . TONSILECTOMY, ADENOIDECTOMY, BILATERAL MYRINGOTOMY AND TUBES    . TONSILLECTOMY    . uterine polyp removal       There were no vitals filed for this visit.  Subjective Assessment - 10/18/19 0831    Subjective  States she had foot drop yesterday after walking about 20 feet to the car and then it resolved after she sat down. States that it has  happened 3 times total now. States she was really sore in the the one sore on that one leg on the side.  Stepping gait noted today.    Limitations  Standing;Walking;Lifting    Patient Stated Goals  to have less pain and to return to walking program    Currently in Pain?  No/denies    Pain Onset  More than a month ago         [facility] PT Assessment - 10/18/19 0001      Assessment   Medical Diagnosis  R buttocks pain    Referring Provider (PT)  Cherly Beach      Deep Tendon Reflexes   DTR Assessment Site  Patella;Achilles;Biceps    Biceps DTR  1+    Patella DTR  1+    Achilles DTR  0                   [facility] Adult PT Treatment/Exercise - 10/18/19 0001      Lumbar Exercises: Stretches   Gastroc Stretch  60 seconds;3 reps   with strap B, then reviewed at stairs     Lumbar Exercises: Supine   Other Supine Lumbar Exercises  hip PROM into ABD and ER - tolerated well  Lumbar Exercises: Prone   Straight Leg Raise  5 reps;5 seconds   4 sets B   Other Prone Lumbar Exercises  hip IR B 4x5, 5" holds    Other Prone Lumbar Exercises  hamstring curls 3x5, 5" holds       Manual Therapy   Manual Therapy  Soft tissue mobilization;Joint mobilization    Manual therapy comments  all manual interventions performed independently of other interventions    Joint Mobilization  B hip traction grade II - felt good     Soft tissue mobilization  STM to right hip adductors and hip flexors. - tolerated moderately well               PT Short Term Goals - 10/08/19 1114      PT SHORT TERM GOAL #1   Title  Patient will be independent in self management strategies to improve quality of life and functional outcomes.    Time  3    Period  Weeks    Status  New    Target Date  10/29/19      PT SHORT TERM GOAL #2   Title  Patient will report at least 25% improvement in overall symptoms and/or functional ability    Time  3    Period  Weeks    Status  New    Target Date  10/29/19         PT Long Term Goals - 10/08/19 1114      PT LONG TERM GOAL #1   Title  Patient will report at least 50% improvement in overall symptoms and/or functional ability    Time  6    Period  Weeks    Status  New    Target Date  11/19/19      PT LONG TERM GOAL #2   Title  Patient will be able perform lumbar mobility without increase in lumbar symptoms to improve gross lumbar mobility.    Time  6    Period  Weeks    Status  New    Target Date  11/19/19      PT LONG TERM GOAL #3   Title  Patient will be able to ambulate daily without limitation or difficulty from hip pain to return to prior level of function.    Time  6    Period  Weeks    Status  New    Target Date  11/19/19            Plan - 10/18/19 0830    Clinical Impression Statement  Patient reported foot drop since last session. Assessment of femoral and sciatic nerve pathways (focused on sciatic). Tightness noted in musculature but no numbness/tingling or pain noted. Severe DF restrictions noted both actively and passively due to foot structure. Added calf stretch to HEP and educated patient on how this could help with symptoms and how it relates to pain she has. Patient verbalized understanding. Will continue to focus on proximal mobility and strength as well as nerve glides (hamstring nerve glide did not present with any symptoms).    Personal Factors and Comorbidities  Comorbidity 1;Comorbidity 2    Comorbidities  arthritis, high arches/ feet in supination    Examination-Activity Limitations  Bend;Transfers;Stand;Stairs;Squat;Lift    Examination-Participation Restrictions  Yard Work;Community Activity    Stability/Clinical Decision Making  Stable/Uncomplicated    Rehab Potential  Good    PT Frequency  2x / week    PT Duration  6 weeks  PT Treatment/Interventions  ADLs/Self Care Home Management;Aquatic Therapy;Biofeedback;Electrical Stimulation;Moist Heat;Traction;Balance training;Therapeutic exercise;Therapeutic  activities;Functional mobility training;Stair training;Gait training;DME Instruction;Neuromuscular re-education;Patient/family education;Manual techniques;Dry needling;Passive range of motion    PT Next Visit Plan  quad lengthening, hamstring/glute activation and strengthening, balance work for feet (ambulates on lateral aspects of feet). R Hip ER stretches and IR mobility along with lumbar mobility. trial traction adn open up right lower trunk    PT Home Exercise Plan  3/30 glutes set, prone knee bends; 4/6 clamshells, bridges, thomas stretch,    Consulted and Agree with Plan of Care  Patient       Patient will benefit from skilled therapeutic intervention in order to improve the following deficits and impairments:  Abnormal gait, Decreased endurance, Pain, Decreased activity tolerance, Decreased strength, Decreased balance, Decreased mobility, Difficulty walking, Decreased range of motion, Postural dysfunction  Visit Diagnosis: Difficulty in walking, not elsewhere classified  Acute midline low back pain with bilateral sciatica  Muscle weakness (generalized)     Problem List Patient Active Problem List   Diagnosis Date Noted  . Pain in right buttock 10/02/2019  . Prediabetes 08/15/2017  . Vitamin D deficiency 05/18/2017  . 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 Osteopenia 04/04/2015  . Hypothyroidism 01/18/2010  . Mixed hyperlipidemia 12/17/2009  . Overweight 09/13/2007   9:37 AM, 10/18/19 Jerene Pitch, DPT Physical Therapy with [facility]  [phone] office  [place] [address] [place], [place], [address] Phone: [phone]   Fax:  [phone]  Name: PERL ESPER MRN: [id number] Date of Birth: [birth year]

## 2019-10-22 ENCOUNTER — Ambulatory Visit (HOSPITAL_COMMUNITY): Payer: Medicare Other | Admitting: Physical Therapy

## 2019-10-22 ENCOUNTER — Other Ambulatory Visit: Payer: Self-pay

## 2019-10-22 ENCOUNTER — Encounter (HOSPITAL_COMMUNITY): Payer: Self-pay | Admitting: Physical Therapy

## 2019-10-22 DIAGNOSIS — M6281 Muscle weakness (generalized): Secondary | ICD-10-CM

## 2019-10-22 DIAGNOSIS — M5441 Lumbago with sciatica, right side: Secondary | ICD-10-CM

## 2019-10-22 DIAGNOSIS — M5442 Lumbago with sciatica, left side: Secondary | ICD-10-CM

## 2019-10-22 DIAGNOSIS — R262 Difficulty in walking, not elsewhere classified: Secondary | ICD-10-CM

## 2019-10-22 NOTE — Therapy (Signed)
[place] [address] [place], [place], [address] Phone: [phone]   Fax:  [phone]  Physical Therapy Treatment  Patient Details  Name: Julie May MRN: [id number] Date of Birth: Jul 28, 1950 Referring Provider (PT): Cherly Beach   Encounter Date: 10/22/2019  PT End of Session - 10/22/19 0916    Visit Number  4    Number of Visits  12    Date for PT Re-Evaluation  11/19/19    Authorization Type  Medicare with BCBS    Authorization Time Period  POC dates 10/08/19 to 11/19/2019    Progress Note Due on Visit  10    PT Start Time  0915    PT Stop Time  0955    PT Time Calculation (min)  40 min    Activity Tolerance  Patient tolerated treatment well    Behavior During Therapy  [facility] for tasks assessed/performed       Past Medical History:  Diagnosis Date  . Bilateral impacted cerumen 04/10/2019  . Callus of foot 11/07/2017  . Foot pain, bilateral 05/23/2013  . Hyperlipidemia   . Hyperpotassemia   . Hypertension    new dx 11/12/2010  . OA (osteoarthritis)   . Other abnormal glucose   . Overweight(278.02)   . Polycythemia, secondary   . Routine general medical examination at a health care facility   . THYROID NODULE 01/18/2010   Qualifier: Diagnosis of  By: Loanne Drilling MD, Jacelyn Pi   . Trigger finger (acquired)   . Unspecified disorder of thyroid   . 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 COLONOSCOPY N/A 03/30/2016   Procedure: COLONOSCOPY;  Surgeon: Rogene Houston, MD;  [facility];  Service: Endoscopy;  Laterality: N/A;  1030  . TONSILECTOMY, ADENOIDECTOMY, BILATERAL MYRINGOTOMY AND TUBES    . TONSILLECTOMY    . uterine polyp removal       There were no vitals filed for this visit.  Subjective Assessment - 10/22/19 0918    Subjective  States that her legs don't seem to last on her and she hasn't gone for any walks to see if she has foot drop. laying on her right side  hurts her right hip. States that pain is just as frequent but not as intense.    Limitations  Standing;Walking;Lifting    Patient Stated Goals  to have less pain and to return to walking program    Pain Score  2     Pain Location  Hip    Pain Orientation  Right    Pain Descriptors / Indicators  Discomfort    Pain Onset  More than a month ago         [facility] PT Assessment - 10/22/19 0001      Assessment   Medical Diagnosis  R buttocks pain    Referring Provider (PT)  Cherly Beach                   [facility] Adult PT Treatment/Exercise - 10/22/19 0001      Lumbar Exercises: Stretches   Lower Trunk Rotation  60 seconds;5 reps      Lumbar Exercises: Supine   Ab Set  --   practiced for 10 minutes seated, verbal and tactile  cues     Manual Therapy   Manual Therapy  Manual Traction    Manual therapy comments  all manual interventions performed independently of other interventions  Soft tissue mobilization  STM/TPR to right psoas with andwithout contract relax - tolerated well. percussion gun to right lateral hip and leg - tolerated well    Manual Traction  over green ball - PT perfomred x5 45" bouts                PT Short Term Goals - 10/08/19 1114      PT SHORT TERM GOAL #1   Title  Patient will be independent in self management strategies to improve quality of life and functional outcomes.    Time  3    Period  Weeks    Status  New    Target Date  10/29/19      PT SHORT TERM GOAL #2   Title  Patient will report at least 25% improvement in overall symptoms and/or functional ability    Time  3    Period  Weeks    Status  New    Target Date  10/29/19        PT Long Term Goals - 10/08/19 1114      PT LONG TERM GOAL #1   Title  Patient will report at least 50% improvement in overall symptoms and/or functional ability    Time  6    Period  Weeks    Status  New    Target Date  11/19/19      PT LONG TERM GOAL #2   Title  Patient will be able  perform lumbar mobility without increase in lumbar symptoms to improve gross lumbar mobility.    Time  6    Period  Weeks    Status  New    Target Date  11/19/19      PT LONG TERM GOAL #3   Title  Patient will be able to ambulate daily without limitation or difficulty from hip pain to return to prior level of function.    Time  6    Period  Weeks    Status  New    Target Date  11/19/19            Plan - 10/22/19 1033    Clinical Impression Statement  Focused on manual interventions on this date. Tolerated well with decreased right hip pain noted. Continued feeling of fatigue in backs of leg with TRA activation, upon palpation, patient activating hamstrings and glutes with TRA activation. Cued patient to focus on muscle relaxation and this helped. Less symptoms reported end of session. Percussion gun felt very good on lateral side of right leg.    Personal Factors and Comorbidities  Comorbidity 1;Comorbidity 2    Comorbidities  arthritis, high arches/ feet in supination    Examination-Activity Limitations  Bend;Transfers;Stand;Stairs;Squat;Lift    Examination-Participation Restrictions  Yard Work;Community Activity    Stability/Clinical Decision Making  Stable/Uncomplicated    Rehab Potential  Good    PT Frequency  2x / week    PT Duration  6 weeks    PT Treatment/Interventions  ADLs/Self Care Home Management;Aquatic Therapy;Biofeedback;Electrical Stimulation;Moist Heat;Traction;Balance training;Therapeutic exercise;Therapeutic activities;Functional mobility training;Stair training;Gait training;DME Instruction;Neuromuscular re-education;Patient/family education;Manual techniques;Dry needling;Passive range of motion    PT Next Visit Plan  f/u with TRA activation and psoas, quad lengthening, hamstring/glute activation and strengthening, balance work for feet (ambulates on lateral aspects of feet). R Hip ER stretches and IR mobility along with lumbar mobility. trial traction adn open up  right lower trunk    PT Home Exercise Plan  3/30 glutes set, prone knee bends;  4/6 clamshells, bridges, thomas stretch,    Consulted and Agree with Plan of Care  Patient       Patient will benefit from skilled therapeutic intervention in order to improve the following deficits and impairments:  Abnormal gait, Decreased endurance, Pain, Decreased activity tolerance, Decreased strength, Decreased balance, Decreased mobility, Difficulty walking, Decreased range of motion, Postural dysfunction  Visit Diagnosis: Difficulty in walking, not elsewhere classified  Acute midline low back pain with bilateral sciatica  Muscle weakness (generalized)     Problem List Patient Active Problem List   Diagnosis Date Noted  . Pain in right buttock 10/02/2019  . Prediabetes 08/15/2017  . Vitamin D deficiency 05/18/2017  . 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 Osteopenia 04/04/2015  . Hypothyroidism 01/18/2010  . Mixed hyperlipidemia 12/17/2009  . Overweight 09/13/2007   10:35 AM, 10/22/19 Jerene Pitch, DPT Physical Therapy with [facility]  [phone] office  [place] [address] [place], [place], [address] Phone: [phone]   Fax:  [phone]  Name: Julie May MRN: [id number] Date of Birth: [birth year]

## 2019-10-25 ENCOUNTER — Other Ambulatory Visit: Payer: Self-pay

## 2019-10-25 ENCOUNTER — Encounter (HOSPITAL_COMMUNITY): Payer: Self-pay | Admitting: Physical Therapy

## 2019-10-25 ENCOUNTER — Ambulatory Visit (HOSPITAL_COMMUNITY): Payer: Medicare Other | Admitting: Physical Therapy

## 2019-10-25 DIAGNOSIS — M5442 Lumbago with sciatica, left side: Secondary | ICD-10-CM

## 2019-10-25 DIAGNOSIS — M5441 Lumbago with sciatica, right side: Secondary | ICD-10-CM

## 2019-10-25 DIAGNOSIS — R262 Difficulty in walking, not elsewhere classified: Secondary | ICD-10-CM | POA: Diagnosis not present

## 2019-10-25 DIAGNOSIS — M6281 Muscle weakness (generalized): Secondary | ICD-10-CM

## 2019-10-25 NOTE — Therapy (Signed)
[place], [place], [address] Phone: [phone]   Fax:  [phone]  Physical Therapy Treatment  Patient Details  Name: Julie May MRN: [id number] Date of Birth: June 27, 1951 Referring Provider (PT): Cherly Beach   Encounter Date: 10/25/2019  PT End of Session - 10/25/19 0917    Visit Number  5    Number of Visits  12    Date for PT Re-Evaluation  11/19/19    Authorization Type  Medicare with BCBS    Authorization Time Period  POC dates 10/08/19 to 11/19/2019    Progress Note Due on Visit  10    PT Start Time  0916    PT Stop Time  0957    PT Time Calculation (min)  41 min    Activity Tolerance  Patient tolerated treatment well    Behavior During Therapy  [facility] for tasks assessed/performed       Past Medical History:  Diagnosis Date  . Bilateral impacted cerumen 04/10/2019  . Callus of foot 11/07/2017  . Foot pain, bilateral 05/23/2013  . Hyperlipidemia   . Hyperpotassemia   . Hypertension    new dx 11/12/2010  . OA (osteoarthritis)   . Other abnormal glucose   . Overweight(278.02)   . Polycythemia, secondary   . Routine general medical examination at a health care facility   . THYROID NODULE 01/18/2010   Qualifier: Diagnosis of  By: Loanne Drilling MD, Jacelyn Pi   . Trigger finger (acquired)   . Unspecified disorder of thyroid   . 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 COLONOSCOPY N/A 03/30/2016   Procedure: COLONOSCOPY;  Surgeon: Rogene Houston, MD;  [facility];  Service: Endoscopy;  Laterality: N/A;  1030  . TONSILECTOMY, ADENOIDECTOMY, BILATERAL MYRINGOTOMY AND TUBES    . TONSILLECTOMY    . uterine polyp removal       There were no vitals filed for this visit.  Subjective Assessment - 10/25/19 0925    Subjective  States able to contract abdominals without glutes/hips contracting and has been sore in abdominals and would like more abdominal  exercises. Side hip is better but knee is still bothering her. States she thinks when she was doing yardwork and it got flared up. States she is still feeling symptoms down legs but no longer waking her up. Reports 90% better since start of PT.    Limitations  Standing;Walking;Lifting    Patient Stated Goals  to have less pain and to return to walking program    Currently in Pain?  Yes    Pain Score  1     Pain Location  Hip    Pain Orientation  Right    Pain Onset  More than a month ago         [facility] PT Assessment - 10/25/19 0001      Assessment   Medical Diagnosis  R buttocks pain    Referring Provider (PT)  Cherly Beach                   [facility] Adult PT Treatment/Exercise - 10/25/19 0001      Lumbar Exercises: Supine   Other Supine Lumbar Exercises  TRA activation- x5 --> with bent knee fall out 3x5 B---> marches x10 B - hip pain on lateral aspect of hip - cued lower abs - pain abolished - 4x 10 B' Then practiced in sitting position -  verbal and tactile cues required.              PT Education - 10/25/19 0950    Education Details  on lower abdominal activation, anatomy and how hip ER/ABD are trying to fire/work with hip flexion and psoas/lower abdominal decrease in muscle activation.    Person(s) Educated  Patient    Methods  Explanation    Comprehension  Verbalized understanding       PT Short Term Goals - 10/25/19 0925      PT SHORT TERM GOAL #1   Title  Patient will be independent in self management strategies to improve quality of life and functional outcomes.    Time  3    Period  Weeks    Status  On-going    Target Date  10/29/19      PT SHORT TERM GOAL #2   Title  Patient will report at least 25% improvement in overall symptoms and/or functional ability    Baseline  4/16 reports she is 90% better    Time  3    Period  Weeks    Status  Achieved    Target Date  10/29/19        PT Long Term Goals - 10/25/19 0926      PT LONG TERM GOAL #1    Title  Patient will report at least 50% improvement in overall symptoms and/or functional ability    Baseline  4/16 90% better    Time  6    Period  Weeks    Status  Achieved      PT LONG TERM GOAL #2   Title  Patient will be able perform lumbar mobility without increase in lumbar symptoms to improve gross lumbar mobility.    Time  6    Period  Weeks    Status  On-going      PT LONG TERM GOAL #3   Title  Patient will be able to ambulate daily without limitation or difficulty from hip pain to return to prior level of function.    Time  6    Period  Weeks    Status  On-going            Plan - 10/25/19 0954    Clinical Impression Statement  Focused on lower abdominal muscle activation secondary to overactivation of lateral hip muscles with hip flexion (accompanied with lateral hip pain) and decreased muscle activation with lower abdominals and hip flexors with this movement. Decreased pain noted with proper muscle activation with hip flexion and with walking. Will follow up with lower core activation next time. Practiced in seated position but this still very challenging for patient.    Personal Factors and Comorbidities  Comorbidity 1;Comorbidity 2    Comorbidities  arthritis, high arches/ feet in supination    Examination-Activity Limitations  Bend;Transfers;Stand;Stairs;Squat;Lift    Examination-Participation Restrictions  Yard Work;Community Activity    Stability/Clinical Decision Making  Stable/Uncomplicated    Rehab Potential  Good    PT Frequency  2x / week    PT Duration  6 weeks    PT Treatment/Interventions  ADLs/Self Care Home Management;Aquatic Therapy;Biofeedback;Electrical Stimulation;Moist Heat;Traction;Balance training;Therapeutic exercise;Therapeutic activities;Functional mobility training;Stair training;Gait training;DME Instruction;Neuromuscular re-education;Patient/family education;Manual techniques;Dry needling;Passive range of motion    PT Next Visit Plan   f/u with TRA activation and psoas, quad lengthening, hamstring/glute activation and strengthening, balance work for feet (ambulates on lateral aspects of feet). R Hip ER stretches and IR mobility along with  lumbar mobility. trial traction adn open up right lower trunk    PT Home Exercise Plan  3/30 glutes set, prone knee bends; 4/6 clamshells, bridges, thomas stretch,; 4/16 lower and upper TRA activation with hip flexion    Consulted and Agree with Plan of Care  Patient       Patient will benefit from skilled therapeutic intervention in order to improve the following deficits and impairments:  Abnormal gait, Decreased endurance, Pain, Decreased activity tolerance, Decreased strength, Decreased balance, Decreased mobility, Difficulty walking, Decreased range of motion, Postural dysfunction  Visit Diagnosis: Difficulty in walking, not elsewhere classified  Acute midline low back pain with bilateral sciatica  Muscle weakness (generalized)     Problem List Patient Active Problem List   Diagnosis Date Noted  . Pain in right buttock 10/02/2019  . Prediabetes 08/15/2017  . Vitamin D deficiency 05/18/2017  . 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 Osteopenia 04/04/2015  . Hypothyroidism 01/18/2010  . Mixed hyperlipidemia 12/17/2009  . Overweight 09/13/2007    10:02 AM, 10/25/19 Jerene Pitch, DPT Physical Therapy with [facility]  [phone] office  [place] [address] [place], [place], [address] Phone: [phone]   Fax:  [phone]  Name: Julie May MRN: [id number] Date of Birth: 12/29/1950

## 2019-10-31 ENCOUNTER — Ambulatory Visit (HOSPITAL_COMMUNITY): Payer: Medicare Other | Admitting: Physical Therapy

## 2019-10-31 ENCOUNTER — Encounter (HOSPITAL_COMMUNITY): Payer: Self-pay | Admitting: Physical Therapy

## 2019-10-31 ENCOUNTER — Other Ambulatory Visit: Payer: Self-pay

## 2019-10-31 DIAGNOSIS — M6281 Muscle weakness (generalized): Secondary | ICD-10-CM

## 2019-10-31 DIAGNOSIS — R262 Difficulty in walking, not elsewhere classified: Secondary | ICD-10-CM | POA: Diagnosis not present

## 2019-10-31 DIAGNOSIS — M5442 Lumbago with sciatica, left side: Secondary | ICD-10-CM | POA: Diagnosis not present

## 2019-10-31 DIAGNOSIS — M5441 Lumbago with sciatica, right side: Secondary | ICD-10-CM | POA: Diagnosis not present

## 2019-10-31 NOTE — Patient Instructions (Signed)
Access Code: [facility] URL: [email or web address] Date: 10/31/2019 Prepared by: Yetta Glassman  Exercises Supine Bilateral Hip Internal Rotation Stretch - 3 sets - 5 reps - 10 hold Seated Hip Internal Rotation with Ball and Resistance - 3 sets - 5 reps - 5 hold

## 2019-10-31 NOTE — Therapy (Signed)
[place], [place], [address] Phone: [phone]   Fax:  [phone]  Physical Therapy Treatment  Patient Details  Name: Julie May MRN: [id number] Date of Birth: January 19, 1951 Referring Provider (PT): Cherly Beach   Encounter Date: 10/31/2019  PT End of Session - 10/31/19 0932    Visit Number  6    Number of Visits  12    Date for PT Re-Evaluation  11/19/19    Authorization Type  Medicare with BCBS    Authorization Time Period  POC dates 10/08/19 to 11/19/2019    Progress Note Due on Visit  10    PT Start Time  0916    PT Stop Time  0956    PT Time Calculation (min)  40 min    Activity Tolerance  Patient tolerated treatment well    Behavior During Therapy  [facility] for tasks assessed/performed       Past Medical History:  Diagnosis Date  . Bilateral impacted cerumen 04/10/2019  . Callus of foot 11/07/2017  . Foot pain, bilateral 05/23/2013  . Hyperlipidemia   . Hyperpotassemia   . Hypertension    new dx 11/12/2010  . OA (osteoarthritis)   . Other abnormal glucose   . Overweight(278.02)   . Polycythemia, secondary   . Routine general medical examination at a health care facility   . THYROID NODULE 01/18/2010   Qualifier: Diagnosis of  By: Loanne Drilling MD, Jacelyn Pi   . Trigger finger (acquired)   . Unspecified disorder of thyroid   . 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 COLONOSCOPY N/A 03/30/2016   Procedure: COLONOSCOPY;  Surgeon: Rogene Houston, MD;  [facility];  Service: Endoscopy;  Laterality: N/A;  1030  . TONSILECTOMY, ADENOIDECTOMY, BILATERAL MYRINGOTOMY AND TUBES    . TONSILLECTOMY    . uterine polyp removal       There were no vitals filed for this visit.  Subjective Assessment - 10/31/19 0916    Subjective  States she is still having the pain and it is moving around, was able to do exercises but pain continued. States her pain is about 2/10.  Usually when she gets up in the morning she is sore. States she knows she is getting better, she is getting up and off the floor better and she feels stronger. States she had foot drop last week when she was on her feet while she was cleaning.    Limitations  Standing;Walking;Lifting    Patient Stated Goals  to have less pain and to return to walking program    Currently in Pain?  Yes    Pain Score  2     Pain Location  Back    Pain Orientation  Right    Pain Descriptors / Indicators  Aching    Pain Type  Acute pain    Pain Onset  More than a month ago         [facility] PT Assessment - 10/31/19 0001      Assessment   Medical Diagnosis  R buttocks pain    Referring Provider (PT)  Cherly Beach                   [facility] Adult PT Treatment/Exercise - 10/31/19 0001      Lumbar Exercises: Seated   Other Seated Lumbar Exercises  hip IR with RTB 1x5 5" holds  Lumbar Exercises: Supine   Other Supine Lumbar Exercises  bent knee fall ins 3x15 B 5" holds     Other Supine Lumbar Exercises  TRA activation - review and practice - 5 minutes      Lumbar Exercises: Prone   Other Prone Lumbar Exercises  hip IR 3x5, 5" holds B then with RTB 3x5, 5" holds      Manual Therapy   Manual Therapy  Soft tissue mobilization    Manual therapy comments  all manual interventions performed independently of other interventions    Soft tissue mobilization  STM to right psoas and iliacus - less pain noted today             PT Education - 10/31/19 1007    Education Details  on HEP    Person(s) Educated  Patient    Methods  Handout;Explanation    Comprehension  Verbalized understanding       PT Short Term Goals - 10/25/19 0925      PT SHORT TERM GOAL #1   Title  Patient will be independent in self management strategies to improve quality of life and functional outcomes.    Time  3    Period  Weeks    Status  On-going    Target Date  10/29/19      PT SHORT TERM GOAL #2   Title   Patient will report at least 25% improvement in overall symptoms and/or functional ability    Baseline  4/16 reports she is 90% better    Time  3    Period  Weeks    Status  Achieved    Target Date  10/29/19        PT Long Term Goals - 10/25/19 0926      PT LONG TERM GOAL #1   Title  Patient will report at least 50% improvement in overall symptoms and/or functional ability    Baseline  4/16 90% better    Time  6    Period  Weeks    Status  Achieved      PT LONG TERM GOAL #2   Title  Patient will be able perform lumbar mobility without increase in lumbar symptoms to improve gross lumbar mobility.    Time  6    Period  Weeks    Status  On-going      PT LONG TERM GOAL #3   Title  Patient will be able to ambulate daily without limitation or difficulty from hip pain to return to prior level of function.    Time  6    Period  Weeks    Status  On-going            Plan - 10/31/19 1007    Clinical Impression Statement  Patient tolerated session well reporting no pain after bent knee fall in stretch. Weakness noted with hip IR and exercise was challenging as patient wanted to compensate with hip abduction. Transitioned to seated hip IR with able to control for hip abduction and this was tolerated well. Fatigue noted in hips with a feeling deep in her right hip but no reports of pain.    Personal Factors and Comorbidities  Comorbidity 1;Comorbidity 2    Comorbidities  arthritis, high arches/ feet in supination    Examination-Activity Limitations  Bend;Transfers;Stand;Stairs;Squat;Lift    Examination-Participation Restrictions  Yard Work;Community Activity    Stability/Clinical Decision Making  Stable/Uncomplicated    Rehab Potential  Good    PT  Frequency  2x / week    PT Duration  6 weeks    PT Treatment/Interventions  ADLs/Self Care Home Management;Aquatic Therapy;Biofeedback;Electrical Stimulation;Moist Heat;Traction;Balance training;Therapeutic exercise;Therapeutic  activities;Functional mobility training;Stair training;Gait training;DME Instruction;Neuromuscular re-education;Patient/family education;Manual techniques;Dry needling;Passive range of motion    PT Next Visit Plan  f/u with TRA activation and psoas, quad lengthening, hamstring/glute activation and strengthening, balance work for feet (ambulates on lateral aspects of feet). R Hip ER stretches and IR mobility along with lumbar mobility. trial traction adn open up right lower trunk    PT Home Exercise Plan  3/30 glutes set, prone knee bends; 4/6 clamshells, bridges, thomas stretch,; 4/16 lower and upper TRA activation with hip flexion; 4/22 hip IR RTB and bent knee fall in stretch    Consulted and Agree with Plan of Care  Patient       Patient will benefit from skilled therapeutic intervention in order to improve the following deficits and impairments:  Abnormal gait, Decreased endurance, Pain, Decreased activity tolerance, Decreased strength, Decreased balance, Decreased mobility, Difficulty walking, Decreased range of motion, Postural dysfunction  Visit Diagnosis: Difficulty in walking, not elsewhere classified  Acute midline low back pain with bilateral sciatica  Muscle weakness (generalized)     Problem List Patient Active Problem List   Diagnosis Date Noted  . Pain in right buttock 10/02/2019  . Prediabetes 08/15/2017  . Vitamin D deficiency 05/18/2017  . 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 Osteopenia 04/04/2015  . Hypothyroidism 01/18/2010  . Mixed hyperlipidemia 12/17/2009  . Overweight 09/13/2007    10:11 AM, 10/31/19 Jerene Pitch, DPT Physical Therapy with [facility]  [phone] office  [place] [address] [place], [place], [address] Phone: [phone]   Fax:  [phone]  Name: AMBERLEE GIPP MRN: [id number] Date of Birth: 1950-11-28

## 2019-11-05 ENCOUNTER — Encounter (HOSPITAL_COMMUNITY): Payer: Self-pay | Admitting: Physical Therapy

## 2019-11-05 ENCOUNTER — Other Ambulatory Visit: Payer: Self-pay

## 2019-11-05 ENCOUNTER — Ambulatory Visit (HOSPITAL_COMMUNITY): Payer: Medicare Other | Admitting: Physical Therapy

## 2019-11-05 DIAGNOSIS — M5441 Lumbago with sciatica, right side: Secondary | ICD-10-CM

## 2019-11-05 DIAGNOSIS — M5442 Lumbago with sciatica, left side: Secondary | ICD-10-CM | POA: Diagnosis not present

## 2019-11-05 DIAGNOSIS — R262 Difficulty in walking, not elsewhere classified: Secondary | ICD-10-CM

## 2019-11-05 DIAGNOSIS — M6281 Muscle weakness (generalized): Secondary | ICD-10-CM

## 2019-11-05 NOTE — Therapy (Signed)
[place], [place], [address] Phone: [phone]   Fax:  [phone]  Physical Therapy Treatment  Patient Details  Name: Julie May MRN: [id number] Date of Birth: 1950/09/23 Referring Provider (PT): Cherly Beach   Encounter Date: 11/05/2019  PT End of Session - 11/05/19 0915    Visit Number  7    Number of Visits  12    Date for PT Re-Evaluation  11/19/19    Authorization Type  Medicare with BCBS    Authorization Time Period  POC dates 10/08/19 to 11/19/2019    Progress Note Due on Visit  10    PT Start Time  0915    PT Stop Time  0955    PT Time Calculation (min)  40 min    Activity Tolerance  Patient tolerated treatment well    Behavior During Therapy  [facility] for tasks assessed/performed       Past Medical History:  Diagnosis Date  . Bilateral impacted cerumen 04/10/2019  . Callus of foot 11/07/2017  . Foot pain, bilateral 05/23/2013  . Hyperlipidemia   . Hyperpotassemia   . Hypertension    new dx 11/12/2010  . OA (osteoarthritis)   . Other abnormal glucose   . Overweight(278.02)   . Polycythemia, secondary   . Routine general medical examination at a health care facility   . THYROID NODULE 01/18/2010   Qualifier: Diagnosis of  By: Loanne Drilling MD, Jacelyn Pi   . Trigger finger (acquired)   . Unspecified disorder of thyroid   . 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 COLONOSCOPY N/A 03/30/2016   Procedure: COLONOSCOPY;  Surgeon: Rogene Houston, MD;  [facility];  Service: Endoscopy;  Laterality: N/A;  1030  . TONSILECTOMY, ADENOIDECTOMY, BILATERAL MYRINGOTOMY AND TUBES    . TONSILLECTOMY    . uterine polyp removal       There were no vitals filed for this visit.  Subjective Assessment - 11/05/19 0921    Subjective  States she said that Sunday her buttocks pain wasn't there but now it's back and had to take an advil. States today it is only about  1/10 pain. States that some days it is there and some days its not there. States butt tightness and clamshells helps. States she really has trouble tightening her lower abdominals. States she feels about 80% better since start of PT.    Limitations  Standing;Walking;Lifting    Patient Stated Goals  to have less pain and to return to walking program    Currently in Pain?  Yes    Pain Score  1     Pain Location  Hip    Pain Orientation  Right    Pain Descriptors / Indicators  Aching    Pain Onset  More than a month ago         [facility] PT Assessment - 11/05/19 0001      Assessment   Medical Diagnosis  R buttocks pain    Referring Provider (PT)  Cherly Beach                   [facility] Adult PT Treatment/Exercise - 11/05/19 0001      Lumbar Exercises: Standing   Other Standing Lumbar Exercises  lumbar extension at counter x10 5" holds       Lumbar Exercises: Supine   Ab Set  20 reps;5 seconds   PT  assessed - then with marches x20   Other Supine Lumbar Exercises  TRA activation DL knee ups wiht hip add ball - pain in right buttocks - able to do single leg march to 90 degrees with ball 3x10.  really focusingo n keeping back flat (this aboilshed  symptoms) performed with leg extension, DL marching and PT assist              PT Education - 11/05/19 0930    Education Details  educated patient on possible reasoning for foot drop and to start walking again to see if foot drop returns. Discussed and educated patient on possible reasons for foot drop/pinched nerves.    Person(s) Educated  Patient    Methods  Explanation    Comprehension  Verbalized understanding       PT Short Term Goals - 10/25/19 0925      PT SHORT TERM GOAL #1   Title  Patient will be independent in self management strategies to improve quality of life and functional outcomes.    Time  3    Period  Weeks    Status  On-going    Target Date  10/29/19      PT SHORT TERM GOAL #2   Title  Patient will  report at least 25% improvement in overall symptoms and/or functional ability    Baseline  4/16 reports she is 90% better    Time  3    Period  Weeks    Status  Achieved    Target Date  10/29/19        PT Long Term Goals - 10/25/19 0926      PT LONG TERM GOAL #1   Title  Patient will report at least 50% improvement in overall symptoms and/or functional ability    Baseline  4/16 90% better    Time  6    Period  Weeks    Status  Achieved      PT LONG TERM GOAL #2   Title  Patient will be able perform lumbar mobility without increase in lumbar symptoms to improve gross lumbar mobility.    Time  6    Period  Weeks    Status  On-going      PT LONG TERM GOAL #3   Title  Patient will be able to ambulate daily without limitation or difficulty from hip pain to return to prior level of function.    Time  6    Period  Weeks    Status  On-going            Plan - 11/05/19 0915    Clinical Impression Statement  Focused on posterior pelvic tilt with exercises. Symptoms in hip abolished when this was performed with TRA activation exercises. Able to perform DL marching with this by end of session with no pain. Instructed patient to walk again before next session to determine if foot drop occurs. Minor hip symptoms noted end of session.    Personal Factors and Comorbidities  Comorbidity 1;Comorbidity 2    Comorbidities  arthritis, high arches/ feet in supination    Examination-Activity Limitations  Bend;Transfers;Stand;Stairs;Squat;Lift    Examination-Participation Restrictions  Yard Work;Community Activity    Stability/Clinical Decision Making  Stable/Uncomplicated    Rehab Potential  Good    PT Frequency  2x / week    PT Duration  6 weeks    PT Treatment/Interventions  ADLs/Self Care Home Management;Aquatic Therapy;Biofeedback;Electrical Stimulation;Moist Heat;Traction;Balance training;Therapeutic exercise;Therapeutic activities;Functional mobility training;Stair training;Gait  training;DME Instruction;Neuromuscular re-education;Patient/family education;Manual techniques;Dry needling;Passive range of motion    PT Next Visit Plan  f/u with TRA activation and psoas, quad lengthening, hamstring/glute activation and strengthening, balance work for feet (ambulates on lateral aspects of feet). R Hip ER stretches and IR mobility along with lumbar mobility. trial traction adn open up right lower trunk    PT Home Exercise Plan  3/30 glutes set, prone knee bends; 4/6 clamshells, bridges, thomas stretch,; 4/16 lower and upper TRA activation with hip flexion; 4/22 hip IR RTB and bent knee fall in stretch; 4/27 posteriior pelvic tilt with TRA exercises.    Consulted and Agree with Plan of Care  Patient       Patient will benefit from skilled therapeutic intervention in order to improve the following deficits and impairments:  Abnormal gait, Decreased endurance, Pain, Decreased activity tolerance, Decreased strength, Decreased balance, Decreased mobility, Difficulty walking, Decreased range of motion, Postural dysfunction  Visit Diagnosis: Difficulty in walking, not elsewhere classified  Acute midline low back pain with bilateral sciatica  Muscle weakness (generalized)     Problem List Patient Active Problem List   Diagnosis Date Noted  . Pain in right buttock 10/02/2019  . Prediabetes 08/15/2017  . Vitamin D deficiency 05/18/2017  . 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 Osteopenia 04/04/2015  . Hypothyroidism 01/18/2010  . Mixed hyperlipidemia 12/17/2009  . Overweight 09/13/2007   9:59 AM, 11/05/19 Jerene Pitch, DPT Physical Therapy with [facility]  [phone] office  [place] [address] [place], [place], [address] Phone: [phone]   Fax:  [phone]  Name: Julie May MRN: [id number] Date of Birth: 20-May-1951

## 2019-11-07 ENCOUNTER — Ambulatory Visit (HOSPITAL_COMMUNITY): Payer: Medicare Other | Admitting: Physical Therapy

## 2019-11-07 ENCOUNTER — Encounter (HOSPITAL_COMMUNITY): Payer: Self-pay | Admitting: Physical Therapy

## 2019-11-07 ENCOUNTER — Other Ambulatory Visit: Payer: Self-pay

## 2019-11-07 DIAGNOSIS — M5441 Lumbago with sciatica, right side: Secondary | ICD-10-CM

## 2019-11-07 DIAGNOSIS — M5442 Lumbago with sciatica, left side: Secondary | ICD-10-CM | POA: Diagnosis not present

## 2019-11-07 DIAGNOSIS — R262 Difficulty in walking, not elsewhere classified: Secondary | ICD-10-CM | POA: Diagnosis not present

## 2019-11-07 DIAGNOSIS — M6281 Muscle weakness (generalized): Secondary | ICD-10-CM | POA: Diagnosis not present

## 2019-11-07 NOTE — Therapy (Signed)
[place], [place], [address] Phone: [phone]   Fax:  [phone]  Physical Therapy Treatment  Patient Details  Name: Julie May MRN: [id number] Date of Birth: 16-Oct-1950 Referring Provider (PT): Cherly Beach   Encounter Date: 11/07/2019  PT End of Session - 11/07/19 0914    Visit Number  8    Number of Visits  12    Date for PT Re-Evaluation  11/19/19    Authorization Type  Medicare with BCBS    Authorization Time Period  POC dates 10/08/19 to 11/19/2019    Progress Note Due on Visit  10    PT Start Time  0915    PT Stop Time  0955    PT Time Calculation (min)  40 min    Activity Tolerance  Patient tolerated treatment well    Behavior During Therapy  [facility] for tasks assessed/performed       Past Medical History:  Diagnosis Date  . Bilateral impacted cerumen 04/10/2019  . Callus of foot 11/07/2017  . Foot pain, bilateral 05/23/2013  . Hyperlipidemia   . Hyperpotassemia   . Hypertension    new dx 11/12/2010  . OA (osteoarthritis)   . Other abnormal glucose   . Overweight(278.02)   . Polycythemia, secondary   . Routine general medical examination at a health care facility   . THYROID NODULE 01/18/2010   Qualifier: Diagnosis of  By: Loanne Drilling MD, Jacelyn Pi   . Trigger finger (acquired)   . Unspecified disorder of thyroid   . 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 COLONOSCOPY N/A 03/30/2016   Procedure: COLONOSCOPY;  Surgeon: Rogene Houston, MD;  [facility];  Service: Endoscopy;  Laterality: N/A;  1030  . TONSILECTOMY, ADENOIDECTOMY, BILATERAL MYRINGOTOMY AND TUBES    . TONSILLECTOMY    . uterine polyp removal       There were no vitals filed for this visit.  Subjective Assessment - 11/07/19 0920    Subjective  States she was walking about 5 minutes and she felt her leg go weak and a lot of pain in her side right hip this happened twice. When  her leg got weak she was able to get up and go for about 5 minutes and she was able to get up and go. States she was sore all over after last session. States she feels a little bit of pain behind her right knee. States when she leaves here she is feeling pretty good but now she is only able to walk about 5 minutes when before it was about 20 minutes.    Limitations  Standing;Walking;Lifting    Patient Stated Goals  to have less pain and to return to walking program    Currently in Pain?  Yes    Pain Score  4     Pain Location  Knee    Pain Orientation  Right    Pain Descriptors / Indicators  Aching    Pain Onset  More than a month ago         [facility] PT Assessment - 11/07/19 0001      Assessment   Medical Diagnosis  R buttocks pain    Referring Provider (PT)  Cherly Beach      Observation/Other Assessments   Observations  Gait - stuck in right lumbar rotation, hip add with heel strick on R, supination B  [facility] Adult PT Treatment/Exercise - 11/07/19 0001      Ambulation/Gait   Ambulation/Gait  Yes    Gait Comments  2:30 - pain presented in right lateral hip and extensors      Lumbar Exercises: Stretches   Lower Trunk Rotation  --   left knee drop only x10 10" holds      Lumbar Exercises: Standing   Other Standing Lumbar Exercises  left hip dip- increased right LE pain, R hip dip - no symptom change- left hip dip with PT pressure to control lower lumbar - les [place] but continued pain       Lumbar Exercises: Seated   Other Seated Lumbar Exercises  sitting on hands/ sit bones - 50/50 weight - educationon and practice - then lateral pelvic tilts in seated position 2 x10 5" holds B     Other Seated Lumbar Exercises  TRA contraction x15 5" holds      Lumbar Exercises: Prone   Other Prone Lumbar Exercises  book stretch on right side/lying x10 10" holds, only 2 in opp side       Manual Therapy   Manual Therapy  Joint mobilization    Manual therapy  comments  all manual interventions performed independently of other interventions    Joint Mobilization  left side lying - R pelvic aterior mobilization and UPA lumbbar spine grade II/III - felt good - symptoms abolished             PT Education - 11/07/19 1000    Education Details  on paying attention to lumbar spine/hip/pelvis and feet when on eliptical. on why it doesn't hurt when on eliptical (no impact and no compensatory strategies with walking). educated patietn in movement compensations.    Person(s) Educated  Patient    Methods  Explanation    Comprehension  Verbalized understanding       PT Short Term Goals - 10/25/19 0925      PT SHORT TERM GOAL #1   Title  Patient will be independent in self management strategies to improve quality of life and functional outcomes.    Time  3    Period  Weeks    Status  On-going    Target Date  10/29/19      PT SHORT TERM GOAL #2   Title  Patient will report at least 25% improvement in overall symptoms and/or functional ability    Baseline  4/16 reports she is 90% better    Time  3    Period  Weeks    Status  Achieved    Target Date  10/29/19        PT Long Term Goals - 10/25/19 0926      PT LONG TERM GOAL #1   Title  Patient will report at least 50% improvement in overall symptoms and/or functional ability    Baseline  4/16 90% better    Time  6    Period  Weeks    Status  Achieved      PT LONG TERM GOAL #2   Title  Patient will be able perform lumbar mobility without increase in lumbar symptoms to improve gross lumbar mobility.    Time  6    Period  Weeks    Status  On-going      PT LONG TERM GOAL #3   Title  Patient will be able to ambulate daily without limitation or difficulty from hip pain to return to prior level of function.  Time  6    Period  Weeks    Status  On-going            Plan - 11/07/19 0914    Clinical Impression Statement  Patient tolerated session well. Noticed right lumbar rotation  and excessive hip adduction on right with walking. Also noted left lateral shift in trunk with sitting and standing. Manual work to improve lumbar rotation to left as well as active stretches for this motion. Instructed patient to focus on posture with sitting and with elliptical training. Will follow up with this next session.    Personal Factors and Comorbidities  Comorbidity 1;Comorbidity 2    Comorbidities  arthritis, high arches/ feet in supination    Examination-Activity Limitations  Bend;Transfers;Stand;Stairs;Squat;Lift    Examination-Participation Restrictions  Yard Work;Community Activity    Stability/Clinical Decision Making  Stable/Uncomplicated    Rehab Potential  Good    PT Frequency  2x / week    PT Duration  6 weeks    PT Treatment/Interventions  ADLs/Self Care Home Management;Aquatic Therapy;Biofeedback;Electrical Stimulation;Moist Heat;Traction;Balance training;Therapeutic exercise;Therapeutic activities;Functional mobility training;Stair training;Gait training;DME Instruction;Neuromuscular re-education;Patient/family education;Manual techniques;Dry needling;Passive range of motion    PT Next Visit Plan  f/u with TRA activation and psoas, quad lengthening, hamstring/glute activation and strengthening, balance work for feet (ambulates on lateral aspects of feet). R Hip ER stretches and IR mobility along with lumbar mobility. trial traction adn open up right lower trunk    PT Home Exercise Plan  3/30 glutes set, prone knee bends; 4/6 clamshells, bridges, thomas stretch,; 4/16 lower and upper TRA activation with hip flexion; 4/22 hip IR RTB and bent knee fall in stretch; 4/27 posteriior pelvic tilt with TRA exercises.    Consulted and Agree with Plan of Care  Patient       Patient will benefit from skilled therapeutic intervention in order to improve the following deficits and impairments:  Abnormal gait, Decreased endurance, Pain, Decreased activity tolerance, Decreased strength,  Decreased balance, Decreased mobility, Difficulty walking, Decreased range of motion, Postural dysfunction  Visit Diagnosis: Difficulty in walking, not elsewhere classified  Acute midline low back pain with bilateral sciatica  Muscle weakness (generalized)     Problem List Patient Active Problem List   Diagnosis Date Noted  . Pain in right buttock 10/02/2019  . Prediabetes 08/15/2017  . Vitamin D deficiency 05/18/2017  . 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 Osteopenia 04/04/2015  . Hypothyroidism 01/18/2010  . Mixed hyperlipidemia 12/17/2009  . Overweight 09/13/2007    12:57 PM, 11/07/19 Jerene Pitch, DPT Physical Therapy with [facility]  [phone] office  [facility] [address] [place], [place], [address] Phone: [phone]   Fax:  [phone]  Name: Julie May MRN: [id number] Date of Birth: 1950/09/26

## 2019-11-12 ENCOUNTER — Ambulatory Visit (HOSPITAL_COMMUNITY): Payer: Medicare Other | Attending: Family Medicine | Admitting: Physical Therapy

## 2019-11-12 ENCOUNTER — Other Ambulatory Visit: Payer: Self-pay

## 2019-11-12 ENCOUNTER — Encounter (HOSPITAL_COMMUNITY): Payer: Self-pay | Admitting: Physical Therapy

## 2019-11-12 DIAGNOSIS — R262 Difficulty in walking, not elsewhere classified: Secondary | ICD-10-CM | POA: Insufficient documentation

## 2019-11-12 DIAGNOSIS — M5441 Lumbago with sciatica, right side: Secondary | ICD-10-CM | POA: Insufficient documentation

## 2019-11-12 DIAGNOSIS — M6281 Muscle weakness (generalized): Secondary | ICD-10-CM | POA: Diagnosis not present

## 2019-11-12 DIAGNOSIS — M5442 Lumbago with sciatica, left side: Secondary | ICD-10-CM | POA: Insufficient documentation

## 2019-11-12 NOTE — Therapy (Signed)
[place], [place], [address] Phone: [phone]   Fax:  [phone]  Physical Therapy Treatment  Patient Details  Name: Julie May MRN: [id number] Date of Birth: 1951-07-04 Referring Provider (PT): Cherly Beach   Encounter Date: 11/12/2019  PT End of Session - 11/12/19 0913    Visit Number  9    Number of Visits  12    Date for PT Re-Evaluation  11/19/19    Authorization Type  Medicare with BCBS    Authorization Time Period  POC dates 10/08/19 to 11/19/2019    Progress Note Due on Visit  10    PT Start Time  0914    PT Stop Time  0954    PT Time Calculation (min)  40 min    Activity Tolerance  Patient tolerated treatment well    Behavior During Therapy  [facility] for tasks assessed/performed       Past Medical History:  Diagnosis Date  . Bilateral impacted cerumen 04/10/2019  . Callus of foot 11/07/2017  . Foot pain, bilateral 05/23/2013  . Hyperlipidemia   . Hyperpotassemia   . Hypertension    new dx 11/12/2010  . OA (osteoarthritis)   . Other abnormal glucose   . Overweight(278.02)   . Polycythemia, secondary   . Routine general medical examination at a health care facility   . THYROID NODULE 01/18/2010   Qualifier: Diagnosis of  By: Loanne Drilling MD, Jacelyn Pi   . Trigger finger (acquired)   . Unspecified disorder of thyroid   . 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 COLONOSCOPY N/A 03/30/2016   Procedure: COLONOSCOPY;  Surgeon: Rogene Houston, MD;  [facility];  Service: Endoscopy;  Laterality: N/A;  1030  . TONSILECTOMY, ADENOIDECTOMY, BILATERAL MYRINGOTOMY AND TUBES    . TONSILLECTOMY    . uterine polyp removal       There were no vitals filed for this visit.  Subjective Assessment - 11/12/19 0919    Subjective  States that she has been walking and she was able to do 5 minutes and then rest and then another 5 m minutes without foot drop but she  did have pain first in the knee and then in the groin. States pain in the butt is not there. When she kicks leg out she hurts in groin.    Limitations  Standing;Walking;Lifting    Patient Stated Goals  to have less pain and to return to walking program    Currently in Pain?  Yes    Pain Score  5     Pain Location  Hip    Pain Orientation  Right    Pain Descriptors / Indicators  Aching    Pain Onset  More than a month ago         [facility] PT Assessment - 11/12/19 0001      Assessment   Medical Diagnosis  R buttocks pain    Referring Provider (PT)  Cherly Beach                   [facility] Adult PT Treatment/Exercise - 11/12/19 0001      Lumbar Exercises: Stretches   Piriformis Stretch  3 reps;30 seconds;Right   seated     Lumbar Exercises: Standing   Other Standing Lumbar Exercises  tandem with one finger support 3x30" B    Other Standing Lumbar Exercises  standing hip abd -  holding on for balance 3x15  R       Lumbar Exercises: Prone   Other Prone Lumbar Exercises  book stretch B x10 Each 10- 20 second holds              PT Education - 11/12/19 0935    Education Details  Educated patient on progress made, continued progress. Discussed and educated balance work and focus on isolated movements. Answered all questions about current presentation.    Person(s) Educated  Patient    Methods  Explanation    Comprehension  Verbalized understanding       PT Short Term Goals - 10/25/19 0925      PT SHORT TERM GOAL #1   Title  Patient will be independent in self management strategies to improve quality of life and functional outcomes.    Time  3    Period  Weeks    Status  On-going    Target Date  10/29/19      PT SHORT TERM GOAL #2   Title  Patient will report at least 25% improvement in overall symptoms and/or functional ability    Baseline  4/16 reports she is 90% better    Time  3    Period  Weeks    Status  Achieved    Target Date  10/29/19        PT  Long Term Goals - 10/25/19 0926      PT LONG TERM GOAL #1   Title  Patient will report at least 50% improvement in overall symptoms and/or functional ability    Baseline  4/16 90% better    Time  6    Period  Weeks    Status  Achieved      PT LONG TERM GOAL #2   Title  Patient will be able perform lumbar mobility without increase in lumbar symptoms to improve gross lumbar mobility.    Time  6    Period  Weeks    Status  On-going      PT LONG TERM GOAL #3   Title  Patient will be able to ambulate daily without limitation or difficulty from hip pain to return to prior level of function.    Time  6    Period  Weeks    Status  On-going            Plan - 11/12/19 0913    Clinical Impression Statement  Focused on education and continued progression of HEP.  Decreased hip pain noted with piriformis stretch and focused on isolated movements to decrease compensatory strategies with balance and hip movements. Anticipate DC from PT next session pending patient presentation. Will review HEP prior to DC.    Personal Factors and Comorbidities  Comorbidity 1;Comorbidity 2    Comorbidities  arthritis, high arches/ feet in supination    Examination-Activity Limitations  Bend;Transfers;Stand;Stairs;Squat;Lift    Examination-Participation Restrictions  Yard Work;Community Activity    Stability/Clinical Decision Making  Stable/Uncomplicated    Rehab Potential  Good    PT Frequency  2x / week    PT Duration  6 weeks    PT Treatment/Interventions  ADLs/Self Care Home Management;Aquatic Therapy;Biofeedback;Electrical Stimulation;Moist Heat;Traction;Balance training;Therapeutic exercise;Therapeutic activities;Functional mobility training;Stair training;Gait training;DME Instruction;Neuromuscular re-education;Patient/family education;Manual techniques;Dry needling;Passive range of motion    PT Next Visit Plan  PN/DC pending pt presentation. f/u with TRA activation and psoas, quad lengthening,  hamstring/glute activation and strengthening, balance work for feet (ambulates on lateral aspects of feet).  R Hip ER stretches and IR mobility along with lumbar mobility. trial traction adn open up right lower trunk    PT Home Exercise Plan  3/30 glutes set, prone knee bends; 4/6 clamshells, bridges, thomas stretch,; 4/16 lower and upper TRA activation with hip flexion; 4/22 hip IR RTB and bent knee fall in stretch; 4/27 posteriior pelvic tilt with TRA exercises.    Consulted and Agree with Plan of Care  Patient       Patient will benefit from skilled therapeutic intervention in order to improve the following deficits and impairments:  Abnormal gait, Decreased endurance, Pain, Decreased activity tolerance, Decreased strength, Decreased balance, Decreased mobility, Difficulty walking, Decreased range of motion, Postural dysfunction  Visit Diagnosis: Difficulty in walking, not elsewhere classified  Acute midline low back pain with bilateral sciatica  Muscle weakness (generalized)     Problem List Patient Active Problem List   Diagnosis Date Noted  . Pain in right buttock 10/02/2019  . Prediabetes 08/15/2017  . Vitamin D deficiency 05/18/2017  . 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 Osteopenia 04/04/2015  . Hypothyroidism 01/18/2010  . Mixed hyperlipidemia 12/17/2009  . Overweight 09/13/2007    10:19 AM, 11/12/19 Jerene Pitch, DPT Physical Therapy with [facility]  [phone] office  [place] [address] [place], [place], [address] Phone: [phone]   Fax:  [phone]  Name: Julie May MRN: [id number] Date of Birth: 09-12-1950

## 2019-11-14 ENCOUNTER — Encounter (HOSPITAL_COMMUNITY): Payer: Self-pay | Admitting: Physical Therapy

## 2019-11-14 ENCOUNTER — Ambulatory Visit (HOSPITAL_COMMUNITY): Payer: Medicare Other | Admitting: Physical Therapy

## 2019-11-14 ENCOUNTER — Other Ambulatory Visit: Payer: Self-pay

## 2019-11-14 DIAGNOSIS — M5441 Lumbago with sciatica, right side: Secondary | ICD-10-CM | POA: Diagnosis not present

## 2019-11-14 DIAGNOSIS — R262 Difficulty in walking, not elsewhere classified: Secondary | ICD-10-CM | POA: Diagnosis not present

## 2019-11-14 DIAGNOSIS — M5442 Lumbago with sciatica, left side: Secondary | ICD-10-CM | POA: Diagnosis not present

## 2019-11-14 DIAGNOSIS — M6281 Muscle weakness (generalized): Secondary | ICD-10-CM

## 2019-11-14 NOTE — Therapy (Signed)
[place], [place], [address] Phone: [phone]   Fax:  [phone]  Physical Therapy Treatment, Progress Note and Discharge Note  Patient Details  Name: Julie May MRN: [id number] Date of Birth: [birth year] Referring Provider (PT): Cherly Beach  Progress Note Reporting Period 3/30/21to 11/14/19  See note below for Objective Data and Assessment of Progress/Goals.   PHYSICAL THERAPY DISCHARGE SUMMARY  Visits from Start of Care: 10  Current functional level related to goals / functional outcomes: See below   Remaining deficits: See below   Education / Equipment: See below  Plan: Patient agrees to discharge.  Patient goals were partially met. Patient is being discharged due to being pleased with the current functional level.  ?????         Encounter Date: 11/14/2019  PT End of Session - 11/14/19 0919    Visit Number  10    Number of Visits  12    Date for PT Re-Evaluation  11/19/19    Authorization Type  Medicare with BCBS    Authorization Time Period  POC dates 10/08/19 to 11/19/2019    Progress Note Due on Visit  10    PT Start Time  0916    PT Stop Time  0956    PT Time Calculation (min)  40 min    Activity Tolerance  Patient tolerated treatment well    Behavior During Therapy  [facility] for tasks assessed/performed       Past Medical History:  Diagnosis Date  . Bilateral impacted cerumen 04/10/2019  . Callus of foot 11/07/2017  . Foot pain, bilateral 05/23/2013  . Hyperlipidemia   . Hyperpotassemia   . Hypertension    new dx 11/12/2010  . OA (osteoarthritis)   . Other abnormal glucose   . Overweight(278.02)   . Polycythemia, secondary   . Routine general medical examination at a health care facility   . THYROID NODULE 01/18/2010   Qualifier: Diagnosis of  By: Loanne Drilling MD, Jacelyn Pi   . Trigger finger (acquired)   . Unspecified disorder of thyroid   . 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 COLONOSCOPY N/A 03/30/2016   Procedure: COLONOSCOPY;  Surgeon: Rogene Houston, MD;  [facility];  Service: Endoscopy;  Laterality: N/A;  1030  . TONSILECTOMY, ADENOIDECTOMY, BILATERAL MYRINGOTOMY AND TUBES    . TONSILLECTOMY    . uterine polyp removal       There were no vitals filed for this visit.  Subjective Assessment - 11/14/19 0921    Subjective  States now she can turn over in bed without pain, can sneeze without pain, still getting pain in her hip with certain movements but overall about 90% better.    Limitations  Standing;Walking;Lifting    Patient Stated Goals  to have less pain and to return to walking program    Currently in Pain?  No/denies    Pain Onset  More than a month ago         [facility] PT Assessment - 11/14/19 0001      Assessment   Medical Diagnosis  R buttocks pain    Referring Provider (PT)  Cherly Beach      AROM   Right Hip Flexion  120   no pain    Right Hip External Rotation   50    Right Hip Internal Rotation   15   pain in hip  Left Hip Flexion  120    Left Hip External Rotation   50    Left Hip Internal Rotation   20    Lumbar Flexion  0% limited    Lumbar Extension  75% limited    no pain    Lumbar - Right Side Bend  50% limited   pain in right hip   Lumbar - Left Side Bend  25% limited    Lumbar - Right Rotation  50% limited     Lumbar - Left Rotation  25% limited   pain in right hip     Strength   Right Hip Extension  4+/5    Right Hip ABduction  4/5   pain in hip   Right Hip ADduction  4+/5    Left Hip Extension  4+/5    Right Knee Flexion  5/5    Right Knee Extension  5/5    Left Knee Flexion  5/5    Left Knee Extension  5/5      Ambulation/Gait   Gait Comments  3:00 pain one spot on hip and running down side of leg.                    [facility] Adult PT Treatment/Exercise - 11/14/19 0001      Lumbar Exercises: Standing   Other Standing Lumbar  Exercises  shoulder extension with dowel x15, 10" holds B    Other Standing Lumbar Exercises  heel raises at counter 2x10 5" holds B      Lumbar Exercises: Supine   Other Supine Lumbar Exercises  shoulder flexion dowel 3x10 5" holds             PT Education - 11/14/19 0952    Education Details  Educated patient in anatomy, answered all questions, discussed progress and continued progress. Discussed following up with MD if foot drop returns. Reviewed HEP.    Person(s) Educated  Patient    Methods  Explanation    Comprehension  Verbalized understanding       PT Short Term Goals - 11/14/19 0921      PT SHORT TERM GOAL #1   Title  Patient will be independent in self management strategies to improve quality of life and functional outcomes.    Time  3    Period  Weeks    Status  Achieved    Target Date  10/29/19      PT SHORT TERM GOAL #2   Title  Patient will report at least 25% improvement in overall symptoms and/or functional ability    Baseline  4/16 reports she is 90% better    Time  3    Period  Weeks    Status  Achieved    Target Date  10/29/19        PT Long Term Goals - 11/14/19 0921      PT LONG TERM GOAL #1   Title  Patient will report at least 50% improvement in overall symptoms and/or functional ability    Baseline  4/16 90% better    Time  6    Period  Weeks    Status  Achieved      PT LONG TERM GOAL #2   Title  Patient will be able perform lumbar mobility without increase in lumbar symptoms to improve gross lumbar mobility.    Time  6    Period  Weeks    Status  On-going  PT LONG TERM GOAL #3   Title  Patient will be able to ambulate daily without limitation or difficulty from hip pain to return to prior level of function.    Baseline  Still currenting feeling it in hips and not walking > 5 minutes at a time    Time  6    Period  Weeks    Status  On-going            Plan - 11/14/19 1615    Clinical Impression Statement  Patient  present for a progress note on this date. She has made great progress and reports 90% better since the start of PT. Patient has met 2/2 STG and 1/3 LTG at this time. Reviewed HEP and answered all questions. Patient to discharge from PT to HEP at this time secondary to progress made.    Personal Factors and Comorbidities  Comorbidity 1;Comorbidity 2    Comorbidities  arthritis, high arches/ feet in supination    Examination-Activity Limitations  Bend;Transfers;Stand;Stairs;Squat;Lift    Examination-Participation Restrictions  Yard Work;Community Activity    Stability/Clinical Decision Making  Stable/Uncomplicated    Rehab Potential  Good    PT Frequency  2x / week    PT Duration  6 weeks    PT Treatment/Interventions  ADLs/Self Care Home Management;Aquatic Therapy;Biofeedback;Electrical Stimulation;Moist Heat;Traction;Balance training;Therapeutic exercise;Therapeutic activities;Functional mobility training;Stair training;Gait training;DME Instruction;Neuromuscular re-education;Patient/family education;Manual techniques;Dry needling;Passive range of motion    PT Next Visit Plan  patient to discharge from PT to [place]  3/30 glutes set, prone knee bends; 4/6 clamshells, bridges, thomas stretch,; 4/16 lower and upper TRA activation with hip flexion; 4/22 hip IR RTB and bent knee fall in stretch; 4/27 posteriior pelvic tilt with TRA exercises.    Consulted and Agree with Plan of Care  Patient       Patient will benefit from skilled therapeutic intervention in order to improve the following deficits and impairments:  Abnormal gait, Decreased endurance, Pain, Decreased activity tolerance, Decreased strength, Decreased balance, Decreased mobility, Difficulty walking, Decreased range of motion, Postural dysfunction  Visit Diagnosis: Difficulty in walking, not elsewhere classified  Acute midline low back pain with bilateral sciatica  Muscle weakness (generalized)     Problem  List Patient Active Problem List   Diagnosis Date Noted  . Pain in right buttock 10/02/2019  . Prediabetes 08/15/2017  . Vitamin D deficiency 05/18/2017  . 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 Osteopenia 04/04/2015  . Hypothyroidism 01/18/2010  . Mixed hyperlipidemia 12/17/2009  . Overweight 09/13/2007    4:15 PM, 11/14/19 Jerene Pitch, DPT Physical Therapy with [facility]  [phone] office  [place] [address] [place], [place], [address] Phone: [phone]   Fax:  [phone]  Name: Julie May MRN: [id number] Date of Birth: 1950/10/07

## 2019-11-18 DIAGNOSIS — H01001 Unspecified blepharitis right upper eyelid: Secondary | ICD-10-CM | POA: Diagnosis not present

## 2019-11-18 DIAGNOSIS — H2513 Age-related nuclear cataract, bilateral: Secondary | ICD-10-CM | POA: Diagnosis not present

## 2019-11-22 ENCOUNTER — Encounter: Payer: Self-pay | Admitting: Family Medicine

## 2019-11-22 ENCOUNTER — Other Ambulatory Visit: Payer: Self-pay | Admitting: Family Medicine

## 2019-11-22 DIAGNOSIS — M7918 Myalgia, other site: Secondary | ICD-10-CM

## 2019-12-02 MED FILL — TIROSINT 75 MCG CAPS: 75 | 90 days supply | Qty: 90 | Fill #0

## 2019-12-16 ENCOUNTER — Ambulatory Visit (INDEPENDENT_AMBULATORY_CARE_PROVIDER_SITE_OTHER): Payer: Medicare Other | Admitting: Orthopedic Surgery

## 2019-12-16 ENCOUNTER — Other Ambulatory Visit: Payer: Self-pay

## 2019-12-16 ENCOUNTER — Ambulatory Visit: Payer: Medicare Other

## 2019-12-16 ENCOUNTER — Encounter: Payer: Self-pay | Admitting: Orthopedic Surgery

## 2019-12-16 VITALS — BP 163/88 | HR 76 | Ht 65.0 in | Wt 172.0 lb

## 2019-12-16 DIAGNOSIS — M545 Low back pain, unspecified: Secondary | ICD-10-CM

## 2019-12-16 NOTE — Progress Notes (Signed)
  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  Started DEC 68088  The patient has had a round of Advil with some relief of her symptoms  Pain runs down the right leg associated with numbness and tingling and no weakness despite no giving way  Review of Systems  Musculoskeletal: Positive for back pain.  Neurological: Negative for tingling.  All other systems reviewed and are negative.  Past Medical History:  Diagnosis Date  . Bilateral impacted cerumen 04/10/2019  . Callus of foot 11/07/2017  . Foot pain, bilateral 05/23/2013  . Hyperlipidemia   . Hyperpotassemia   . Hypertension    new dx 11/12/2010  . OA (osteoarthritis)   . Other abnormal glucose   . Overweight(278.02)   . Polycythemia, secondary   . Routine general medical examination at a health care facility   . THYROID NODULE 01/18/2010   Qualifier: Diagnosis of  By: Loanne Drilling MD, Jacelyn Pi   . Trigger finger (acquired)   . Unspecified disorder of thyroid   . 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    Appearance: She is well-developed.  Cardiovascular:     Comments: No peripheral edema Musculoskeletal:     Comments: Back non tender normal rom   Legs normal strength normal sensation   Left SLR normal   Rt SLR reproduced leg pain   Reflexes normal     Skin:    General: Skin is warm and dry.  Neurological:     Mental Status: She is alert and oriented to person, place, and time.     Sensory: No sensory deficit.     Coordination: Coordination normal.     Gait: Gait normal.     Deep Tendon Reflexes: Reflexes are normal and symmetric.     Encounter Diagnosis  Name  Primary?  . Lumbar pain Yes    MRI   Call results

## 2019-12-16 NOTE — Patient Instructions (Signed)
Your MRI has been ordered You will get a call from Ama with the scheduling of the study  Dr Aline Brochure will call you when he receives the report.

## 2019-12-18 ENCOUNTER — Telehealth: Payer: Self-pay | Admitting: Orthopedic Surgery

## 2019-12-18 ENCOUNTER — Other Ambulatory Visit: Payer: Self-pay | Admitting: Orthopedic Surgery

## 2019-12-18 DIAGNOSIS — M545 Low back pain, unspecified: Secondary | ICD-10-CM

## 2019-12-18 NOTE — Telephone Encounter (Signed)
Patient called regarding the"open"MRI scheduled at [facility] on 01/18/20 for lumbar spine. States she has decided, rather than waiting a month, that she would like to have it done at [facility]. Please advise.

## 2019-12-18 NOTE — Telephone Encounter (Signed)
Have sent email to April Pait to try for [facility]

## 2020-01-02 ENCOUNTER — Ambulatory Visit (HOSPITAL_COMMUNITY)
Admission: RE | Admit: 2020-01-02 | Discharge: 2020-01-02 | Disposition: A | Discharge status: Home / Self Care | Payer: Medicare Other | Source: Ambulatory Visit | Attending: Orthopedic Surgery | Admitting: Orthopedic Surgery

## 2020-01-02 ENCOUNTER — Other Ambulatory Visit: Payer: Self-pay

## 2020-01-02 DIAGNOSIS — M545 Low back pain, unspecified: Secondary | ICD-10-CM

## 2020-01-06 ENCOUNTER — Telehealth: Payer: Self-pay | Admitting: Radiology

## 2020-01-06 ENCOUNTER — Telehealth: Payer: Self-pay

## 2020-01-06 DIAGNOSIS — M545 Low back pain, unspecified: Secondary | ICD-10-CM

## 2020-01-06 NOTE — Progress Notes (Signed)
Refer to neurosurgery   May call you   I had to leave a message   CLINICAL DATA:  Low back pain. Right-sided back pain and buttock pain while walking.  EXAM: MRI LUMBAR SPINE WITHOUT CONTRAST  TECHNIQUE: Multiplanar, multisequence MR imaging of the lumbar spine was performed. No intravenous contrast was administered.  COMPARISON:  Lumbar radiographs 12/16/2019  FINDINGS: Segmentation: Transitional lumbosacral anatomy. The ileo lumbar ligament is identified attaching to the L5 transverse process. This places the lowest fully developed disc space at L5-S1. There is a hypoplastic disc space at S1-2. S1 is therefore considered a transitional.  Alignment:  3 mm anterolisthesis L4-5  Vertebrae:  Negative for fracture or mass.  Normal bone marrow.  Conus medullaris and cauda equina: Conus extends to the L2 level. Conus and cauda equina appear normal.  Paraspinal and other soft tissues: Negative for paraspinous mass or adenopathy or fluid collection.  Disc levels:  L1-2: Mild disc bulging.  Negative for stenosis  L2-3: Small central disc protrusion. Negative for stenosis or neural impingement.  L3-4: Disc degeneration with diffuse disc bulging left greater than right. Bilateral facet and ligamentum flavum hypertrophy. Mild spinal stenosis and mild subarticular stenosis bilaterally  L4-5: 3 mm anterolisthesis with severe facet degeneration. 5 mm synovial cyst on the left projecting into the spinal canal. There is severe spinal stenosis with moderate to severe subarticular stenosis bilaterally. The synovial cyst is below the subarticular zone and is indenting the thecal sac.  L5-S1: Mild disc bulging. Moderate facet hypertrophy. Negative for spinal or foraminal stenosis.  IMPRESSION: Mild spinal stenosis and mild subarticular stenosis bilaterally L3-4  Severe spinal stenosis L4-5 with severe facet degeneration and grade 1 anterolisthesis. 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   Electronically Signed   By: Franchot Gallo M.D.   On: 01/03/2020 09:26

## 2020-01-06 NOTE — Telephone Encounter (Signed)
Patient called stating she or her husband didn't know of any neurosurgeons and would like to see who Dr. Aline Brochure would recommend. Please call and advise

## 2020-01-06 NOTE — Telephone Encounter (Signed)
Patient called, in response to Dr Ruthe Mannan message.  I asked her if there was a certain NSU or office that she wanted to go to, and she says she will call you back to advise. FYI

## 2020-01-07 NOTE — Telephone Encounter (Signed)
I have also left message for her on Voice mail

## 2020-01-07 NOTE — Addendum Note (Signed)
Addended byCandice Camp on: 01/07/2020 12:53 PM   Modules accepted: Orders

## 2020-01-07 NOTE — Telephone Encounter (Signed)
She wants to try for Dr Vertell Limber ordered If too long to wait she will ask for first available

## 2020-01-07 NOTE — Telephone Encounter (Signed)
She called x 2 and sent my chart I was out of the office yesterday afternoon I have asked her via my chart if she wants to be referred to [facility] for next available or if she specifically wants Dr Vertell Limber, which will take longer.

## 2020-01-09 HISTORY — PX: SPINE SURGERY: SHX786

## 2020-01-18 ENCOUNTER — Other Ambulatory Visit: Payer: Medicare Other

## 2020-01-20 DIAGNOSIS — R030 Elevated blood-pressure reading, without diagnosis of hypertension: Secondary | ICD-10-CM | POA: Insufficient documentation

## 2020-01-20 DIAGNOSIS — M48062 Spinal stenosis, lumbar region with neurogenic claudication: Secondary | ICD-10-CM | POA: Diagnosis not present

## 2020-01-20 DIAGNOSIS — M4316 Spondylolisthesis, lumbar region: Secondary | ICD-10-CM | POA: Diagnosis not present

## 2020-01-20 DIAGNOSIS — Z6827 Body mass index (BMI) 27.0-27.9, adult: Secondary | ICD-10-CM | POA: Diagnosis not present

## 2020-01-20 DIAGNOSIS — M5416 Radiculopathy, lumbar region: Secondary | ICD-10-CM | POA: Diagnosis not present

## 2020-01-22 ENCOUNTER — Other Ambulatory Visit: Payer: Self-pay | Admitting: Neurosurgery

## 2020-01-23 NOTE — Progress Notes (Signed)
[place], [place] - [address] [place] [address] Phone: [phone] Fax: [phone]      Your procedure is scheduled on Tuesday, July 20th.  Report to [facility] Main Entrance "A" at 7:45 A.M., and check in at the Admitting office.  Call this number if you have problems the morning of surgery:  [phone]  Call [phone] if you have any questions prior to your surgery date Monday-Friday 8am-4pm    Remember:  Do not eat or drink after midnight the night before your surgery     Take these medicines the morning of surgery with A SIP OF WATER   Tirosint  Follow your surgeon's instructions on when to stop Aspirin.  If no instructions were given by your surgeon then you will need to call the office to get those instructions.    As of today, STOP taking any Aleve, Naproxen, Ibuprofen, Motrin, Advil, Goody's, BC's, all herbal medications, fish oil, and all vitamins.                      Do not wear jewelry, make up, or nail polish            Do not wear lotions, powders, perfumes, or deodorant.            Do not shave 48 hours prior to surgery.              Do not bring valuables to the hospital.            [facility] is not responsible for any belongings or valuables.  Do NOT Smoke (Tobacco/Vaping) or drink Alcohol 24 hours prior to your procedure If you use a CPAP at night, you may bring all equipment for your overnight stay.   Contacts, glasses, dentures or bridgework may not be worn into surgery.      For patients admitted to the hospital, discharge time will be determined by your treatment team.   Patients discharged the day of surgery will not be allowed to drive home, and someone needs to stay with them for 24 hours.    Special instructions:   [facility]- Preparing For Surgery  Before surgery, you can play an important role. Because skin is not sterile, your skin needs to be as free of germs as  possible. You can reduce the number of germs on your skin by washing with CHG (chlorahexidine gluconate) Soap before surgery.  CHG is an antiseptic cleaner which kills germs and bonds with the skin to continue killing germs even after washing.    Oral Hygiene is also important to reduce your risk of infection.  Remember - BRUSH YOUR TEETH THE MORNING OF SURGERY WITH YOUR REGULAR TOOTHPASTE  Please do not use if you have an allergy to CHG or antibacterial soaps. If your skin becomes reddened/irritated stop using the CHG.  Do not shave (including legs and underarms) for at least 48 hours prior to first CHG shower. It is OK to shave your face.  Please follow these instructions carefully.   1. Shower the NIGHT BEFORE SURGERY and the MORNING OF SURGERY with CHG Soap.   2. If you chose to wash your hair, wash your hair first as usual with your normal shampoo.  3. After you shampoo, rinse your hair and body thoroughly to remove the shampoo.  4. Use CHG as you would any other liquid soap. You can apply CHG directly to  the skin and wash gently with a scrungie or a clean washcloth.   5. Apply the CHG Soap to your body ONLY FROM THE NECK DOWN.  Do not use on open wounds or open sores. Avoid contact with your eyes, ears, mouth and genitals (private parts). Wash Face and genitals (private parts)  with your normal soap.   6. Wash thoroughly, paying special attention to the area where your surgery will be performed.  7. Thoroughly rinse your body with warm water from the neck down.  8. DO NOT shower/wash with your normal soap after using and rinsing off the CHG Soap.  9. Pat yourself dry with a CLEAN TOWEL.  10. Wear CLEAN PAJAMAS to bed the night before surgery  11. Place CLEAN SHEETS on your bed the night of your first shower and DO NOT SLEEP WITH PETS.   Day of Surgery: Wear Clean/Comfortable clothing the morning of surgery Do not apply any deodorants/lotions.   Remember to brush your teeth  WITH YOUR REGULAR TOOTHPASTE.   Please read over the following fact sheets that you were given.

## 2020-01-24 ENCOUNTER — Other Ambulatory Visit: Payer: Self-pay

## 2020-01-24 ENCOUNTER — Encounter (HOSPITAL_COMMUNITY)
Admission: RE | Admit: 2020-01-24 | Discharge: 2020-01-24 | Disposition: A | Discharge status: Home / Self Care | Payer: Medicare Other | Source: Ambulatory Visit | Attending: Neurosurgery | Admitting: Neurosurgery

## 2020-01-24 ENCOUNTER — Encounter (HOSPITAL_COMMUNITY): Payer: Self-pay

## 2020-01-24 ENCOUNTER — Other Ambulatory Visit (HOSPITAL_COMMUNITY)
Admission: RE | Admit: 2020-01-24 | Discharge: 2020-01-24 | Disposition: A | Discharge status: Home / Self Care | Payer: Medicare Other | Source: Ambulatory Visit | Attending: Neurosurgery | Admitting: Neurosurgery

## 2020-01-24 DIAGNOSIS — Z20822 Contact with and (suspected) exposure to covid-19: Secondary | ICD-10-CM | POA: Insufficient documentation

## 2020-01-24 DIAGNOSIS — Z01812 Encounter for preprocedural laboratory examination: Secondary | ICD-10-CM | POA: Diagnosis not present

## 2020-01-24 HISTORY — DX: Hypothyroidism, unspecified: E03.9

## 2020-01-24 LAB — CBC
HCT: 47.4 % — ABNORMAL HIGH (ref 36.0–46.0)
Hemoglobin: 15.7 g/dL — ABNORMAL HIGH (ref 12.0–15.0)
MCH: 29.1 pg (ref 26.0–34.0)
MCHC: 33.1 g/dL (ref 30.0–36.0)
MCV: 87.8 fL (ref 80.0–100.0)
Platelets: 275 10*3/uL (ref 150–400)
RBC: 5.4 MIL/uL — ABNORMAL HIGH (ref 3.87–5.11)
RDW: 13.2 % (ref 11.5–15.5)
WBC: 5.1 10*3/uL (ref 4.0–10.5)
nRBC: 0 % (ref 0.0–0.2)

## 2020-01-24 LAB — BASIC METABOLIC PANEL
Anion gap: 9 (ref 5–15)
BUN: 9 mg/dL (ref 8–23)
CO2: 24 mmol/L (ref 22–32)
Calcium: 9.2 mg/dL (ref 8.9–10.3)
Chloride: 99 mmol/L (ref 98–111)
Creatinine, Ser: 0.73 mg/dL (ref 0.44–1.00)
GFR calc Af Amer: >60 mL/min (ref >60)
GFR calc non Af Amer: >60 mL/min (ref >60)
Glucose, Bld: 106 mg/dL — ABNORMAL HIGH (ref 70–99)
Potassium: 4.1 mmol/L (ref 3.5–5.1)
Sodium: 132 mmol/L — ABNORMAL LOW (ref 135–145)

## 2020-01-24 LAB — SURGICAL PCR SCREEN
MRSA, PCR: NEGATIVE
Staphylococcus aureus: NEGATIVE

## 2020-01-24 LAB — TYPE AND SCREEN
ABO/RH(D): B POS
Antibody Screen: NEGATIVE

## 2020-01-24 LAB — SARS CORONAVIRUS 2 (TAT 6-24 HRS): SARS Coronavirus 2: NEGATIVE

## 2020-01-24 NOTE — Progress Notes (Signed)
PCP - Dr. Tula Nakayama Cardiologist - patient denies  PPM/ICD - n/a Device Orders -  Rep Notified -   Chest x-ray - n/a EKG - 04/16/2019 Stress Test - patient denies ECHO - patient denies Cardiac Cath - patient denies  Sleep Study - patient denies, negative stop bang CPAP -   Fasting Blood Sugar - n/a Checks Blood Sugar _____ times a day  Blood Thinner Instructions:  Aspirin Instructions: patient takes aspirin PRN for pain, has not taken any in at least 1 week  ERAS Protcol - per MD orders, NPO p MN PRE-SURGERY Ensure or G2- n/a  COVID TEST- scheduled after PAT appointment   Anesthesia review: n/a  Patient denies shortness of breath, fever, cough and chest pain at PAT appointment   All instructions explained to the patient, with a verbal understanding of the material. Patient agrees to go over the instructions while at home for a better understanding. Patient also instructed to self quarantine after being tested for COVID-19. The opportunity to ask questions was provided.

## 2020-01-27 NOTE — H&P (Signed)
Patient ID:   [id number] Patient: Julie May  Date of Birth: 1950-09-13 Visit Type: Office Visit   Date: 01/20/2020 01:00 PM Provider: Marchia Meiers. Vertell Limber MD   This 69 year old female presents for leg/ lower back pain.  HISTORY OF PRESENT ILLNESS:  1.  leg/ lower back pain  Julie May, 69 year old retired female, visits for evaluation.  Patient reports right-sided buttock, (occasionally bilateral), pain, right leg numbness and weakness x6 months, typically when walking more than 5 minutes.  She also notes her left leg buckled once.  Advil taken only p.r.n.  Physical therapy times 10 weeks offered some relief, strengthening  History:  Hypothyroidism, arthritis Surgical history:  Tonsillectomy 1968, uterine polyp excision 2013  Lumbar MRI 01/02/2020 and x-ray on canopy  The patient describes pain that is 5/10 while she is hurting.  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  Both she and her husband note that she is having more problems standing on her right foot and feels that she is much more clumsy than she used to be.  She notes numbness in her thigh to the knee and says she has pain after about 5 minutes of standing.  Her lumbar imaging demonstrates L4-5 spondylolisthesis and stenosis more marked on the right side with a synovial cyst on the left at the L4-5 level.  The patient has 6 mm of anterolisthesis of L4 on L5 on neutral lateral radiograph.          PAST MEDICAL/SURGICAL HISTORY:   (Detailed)    Disease/disorder Onset Date Management Date Comments  Arthritis         PAST MEDICAL HISTORY, SURGICAL HISTORY, FAMILY HISTORY, SOCIAL HISTORY AND REVIEW OF SYSTEMS I have reviewed the patient's past medical, surgical, family and social history as well as the comprehensive review of systems as included on the [facility] history form dated 01/10/2020, which I have signed.  Family History:  (Detailed)   Social  History:  (Detailed) Tobacco use reviewed. Preferred language is Unknown.   Tobacco use status: Current non-smoker. Smoking status: Never smoker.  SMOKING STATUS Type Smoking Status Usage Per Day Years Used Total Pack Years   Never smoker          MEDICATIONS: (added, continued or stopped this visit) Started Medication Directions Instruction Stopped   Tirosint 75 mcg capsule      Vitamin D3  ORAL        ALLERGIES: Ingredient Reaction Medication Name Comment  NO KNOWN ALLERGIES     No known allergies. Reviewed, no changes.   REVIEW OF SYSTEMS   See scanned patient registration form, dated 01/10/2020, signed and dated on 01/21/2020  Review of Systems Details System Neg/Pos Details  Constitutional Negative Chills, Fatigue, Fever, Malaise, Night sweats, Weight gain and Weight loss.  ENMT Negative Ear drainage, Hearing loss, Nasal drainage, Otalgia, Sinus pressure and Sore throat.  Eyes Negative Eye discharge, Eye pain and Vision changes.  Respiratory Negative Chronic cough, Cough, Dyspnea, Known TB exposure and Wheezing.  Cardio Negative Chest pain, Claudication, Edema and Irregular heartbeat/palpitations.  GI Negative Abdominal pain, Blood in stool, Change in stool pattern, Constipation, Decreased appetite, Diarrhea, Heartburn, Nausea and Vomiting.  GU Negative Dysuria, Hematuria, Polyuria (Genitourinary), Urinary frequency, Urinary incontinence and Urinary retention.  Endocrine Negative Cold intolerance, Heat intolerance, Polydipsia and Polyphagia.  Neuro Positive Extremity weakness, Gait disturbance, Numbness in extremity.  Psych Negative Anxiety, Depression and Insomnia.  Integumentary Negative Brittle hair, Brittle nails, Change  in shape/size of mole(s), Hair loss, Hirsutism, Hives, Pruritus, Rash and Skin lesion.  MS Positive Back pain.  Hema/Lymph Negative Easy bleeding, Easy bruising and Lymphadenopathy.  Allergic/Immuno Negative Contact allergy, Environmental  allergies, Food allergies and Seasonal allergies.  Reproductive Negative Breast discharge, Breast lumps, Dysmenorrhea, Dyspareunia, History of abnormal PAP smear, Hot flashes, Irregular menses and Vaginal discharge.   PHYSICAL EXAM:   Vitals Date Temp F BP Pulse Ht In Wt Lb BMI BSA Pain Score  01/20/2020  189/101 103 65 165 27.46  0/10  01/20/2020  158/84 72     0/10    PHYSICAL EXAM Details General Level of Distress: no acute distress Overall Appearance: normal  Head and Face  Right Left  Fundoscopic Exam:  normal normal    Cardiovascular Cardiac: regular rate and rhythm without murmur  Right Left  Carotid Pulses: normal normal  Respiratory Lungs: clear to auscultation  Neurological Orientation: normal Recent and Remote Memory: normal Attention Span and Concentration:   normal Language: normal Fund of Knowledge: normal  Right Left Sensation: normal normal Upper Extremity Coordination: normal normal  Lower Extremity Coordination: normal normal  Musculoskeletal Gait and Station: normal  Right Left Upper Extremity Muscle Strength: normal normal Lower Extremity Muscle Strength: normal normal Upper Extremity Muscle Tone:  normal normal Lower Extremity Muscle Tone: normal normal   Motor Strength Upper and lower extremity motor strength was tested in the clinically pertinent muscles. Any abnormal findings will be noted below.   Right Left Tib Anterior: 4/5  EHL: 4-/5 4+/5   Deep Tendon Reflexes  Right Left Biceps: normal normal Triceps: normal normal Brachioradialis: normal normal Patellar: normal normal Achilles: normal normal  Sensory Sensation was tested at L1 to S1.   Cranial Nerves II. Optic Nerve/Visual Fields: normal III. Oculomotor: normal IV. Trochlear: normal V. Trigeminal: normal VI. Abducens: normal VII. Facial: normal VIII. Acoustic/Vestibular: normal IX. Glossopharyngeal: normal X. Vagus: normal XI. Spinal Accessory: normal XII.  Hypoglossal: normal  Motor and other Tests Lhermittes: negative Rhomberg: negative Pronator drift: absent     Right Left Hoffman's: normal normal Clonus: normal normal Babinski: normal normal SLR: negative negative Patrick's Corky Sox): negative negative Toe Walk: normal normal Toe Lift: normal normal Heel Walk: normal normal SI Joint: nontender nontender   Additional Findings:  Right hip abductor 4/5.  Cavus deformity of both feet.  Patient is able to stand on toes but not able to stand on her heels on either side.  She is able to bend touch her toes.    IMPRESSION:   The patient has severe spinal stenosis at L4-5 with spondylolisthesis at this level.  She has significant weakness in her right leg.  She has a synovial cyst which is contributing to the spinal stenosis.  I have recommended decompression and fusion at the L4-5 level.  PLAN:  Proceed with L4-5 decompression and fusion surgery.  Risks and benefits were discussed in detail with the patient and she wishes to proceed with surgery.  She has been given a prescription for an LSO brace and detailed patient education was performed.  Her questions were answered.  Orders: Diagnostic Procedures: Assessment Procedure  M54.16 Lumbar Spine- AP/Lat  M54.16 Lumbar Spine- Flex/Ex - Only-  Instruction(s)/Education: Assessment Instruction  R03.0 Hypertension education  Z68.27 Dietary management education, guidance, and counseling  Miscellaneous: Assessment   M48.062 LSO Brace   Completed Orders (this encounter) Order Details Reason Side Interpretation Result Initial Treatment Date Region  Lumbar Spine- Flex/Ex - Only-  01/20/2020 All Levels to All Levels  Dietary management education, guidance, and counseling Encouraged to eat well balanced diet and follow up with primary care physician        Hypertension education Continue to monitor blood pressure. If blood pressure remains elevated contact primary care          Assessment/Plan   # Detail Type Description   1. Assessment Spondylolisthesis, lumbar region (M43.16).       2. Assessment Lumbar radiculopathy (M54.16).       3. 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   Plan Orders LSO Brace.       4. Assessment Body mass index (BMI) 27.0-27.9, adult (T24.46).   Plan Orders Today's instructions / counseling include(s) Dietary management education, guidance, and counseling. Clinical information/comments: Encouraged to eat well balanced diet and follow up with primary care physician.       5. Assessment Elevated blood-pressure reading, w/o diagnosis of htn (R03.0).         Pain Management Plan Pain Scale: 0/10. Method: Numeric Pain Intensity Scale.              Provider:  Marchia Meiers. Vertell Limber MD  01/22/2020 08:22 AM    Dictation edited by: Marchia Meiers. Vertell Limber    CC Providers: Tula Nakayama [facility] [address] [place],  [place]  [id number]-   Stanley Harrison  [facility] [address], [place] [address]-               Electronically signed by Marchia Meiers Vertell Limber MD on 01/22/2020 08:22 AM

## 2020-01-28 ENCOUNTER — Inpatient Hospital Stay (HOSPITAL_COMMUNITY): Payer: Medicare Other | Admitting: Vascular Surgery

## 2020-01-28 ENCOUNTER — Encounter (HOSPITAL_COMMUNITY): Payer: Self-pay | Admitting: Neurosurgery

## 2020-01-28 ENCOUNTER — Inpatient Hospital Stay (HOSPITAL_COMMUNITY): Payer: Medicare Other | Admitting: Certified Registered Nurse Anesthetist

## 2020-01-28 ENCOUNTER — Other Ambulatory Visit: Payer: Self-pay

## 2020-01-28 ENCOUNTER — Inpatient Hospital Stay (HOSPITAL_COMMUNITY): Payer: Medicare Other

## 2020-01-28 ENCOUNTER — Observation Stay (HOSPITAL_COMMUNITY)
Admission: RE | Admit: 2020-01-28 | Discharge: 2020-01-30 | Disposition: A | Discharge status: Home / Self Care | Payer: Medicare Other | Attending: Neurosurgery | Admitting: Neurosurgery

## 2020-01-28 ENCOUNTER — Encounter (HOSPITAL_COMMUNITY)
Admission: RE | Disposition: A | Discharge status: Home / Self Care | Payer: Self-pay | Source: Home / Self Care | Attending: Neurosurgery

## 2020-01-28 DIAGNOSIS — M48062 Spinal stenosis, lumbar region with neurogenic claudication: Secondary | ICD-10-CM | POA: Diagnosis not present

## 2020-01-28 DIAGNOSIS — Z419 Encounter for procedure for purposes other than remedying health state, unspecified: Secondary | ICD-10-CM

## 2020-01-28 DIAGNOSIS — I10 Essential (primary) hypertension: Secondary | ICD-10-CM | POA: Diagnosis not present

## 2020-01-28 DIAGNOSIS — M7138 Other bursal cyst, other site: Secondary | ICD-10-CM | POA: Diagnosis not present

## 2020-01-28 DIAGNOSIS — M5416 Radiculopathy, lumbar region: Secondary | ICD-10-CM | POA: Diagnosis not present

## 2020-01-28 DIAGNOSIS — M4316 Spondylolisthesis, lumbar region: Secondary | ICD-10-CM | POA: Insufficient documentation

## 2020-01-28 DIAGNOSIS — M4326 Fusion of spine, lumbar region: Secondary | ICD-10-CM | POA: Diagnosis not present

## 2020-01-28 LAB — ABO/RH: ABO/RH(D): B POS

## 2020-01-28 SURGERY — POSTERIOR LUMBAR FUSION 1 LEVEL
Anesthesia: General | Site: Back

## 2020-01-28 MED ORDER — LIDOCAINE-EPINEPHRINE 1 %-1:100000 IJ SOLN
INTRAMUSCULAR | Status: AC
  Filled 2020-01-28: qty 1

## 2020-01-28 MED ORDER — METHOCARBAMOL 500 MG PO TABS
ORAL_TABLET | ORAL | Status: AC
  Filled 2020-01-28: qty 1

## 2020-01-28 MED ORDER — CHLORHEXIDINE GLUCONATE CLOTH 2 % EX PADS: 6.0000 | MEDICATED_PAD | Freq: Once | CUTANEOUS | Status: DC

## 2020-01-28 MED ORDER — DOCUSATE SODIUM 100 MG PO CAPS
100.0000 mg | ORAL_CAPSULE | Freq: Two times a day (BID) | ORAL | Status: DC
  Administered 2020-01-28 – 2020-01-30 (×5): 100 mg via ORAL
  Filled 2020-01-28 (×4): qty 1

## 2020-01-28 MED ORDER — LIDOCAINE 2% (20 MG/ML) 5 ML SYRINGE
INTRAMUSCULAR | Status: AC
  Filled 2020-01-28: qty 5

## 2020-01-28 MED ORDER — ONDANSETRON HCL 4 MG/2ML IJ SOLN: 4.0000 mg | Freq: Four times a day (QID) | INTRAMUSCULAR | Status: DC | PRN

## 2020-01-28 MED ORDER — DEXAMETHASONE SODIUM PHOSPHATE 10 MG/ML IJ SOLN
INTRAMUSCULAR | Status: DC | PRN
  Administered 2020-01-28: 10 mg via INTRAVENOUS

## 2020-01-28 MED ORDER — METHOCARBAMOL 500 MG PO TABS
500.0000 mg | ORAL_TABLET | Freq: Four times a day (QID) | ORAL | Status: DC | PRN
  Administered 2020-01-28 (×2): 500 mg via ORAL
  Filled 2020-01-28: qty 1

## 2020-01-28 MED ORDER — ALUM & MAG HYDROXIDE-SIMETH 200-200-20 MG/5ML PO SUSP: 30.0000 mL | Freq: Four times a day (QID) | ORAL | Status: DC | PRN

## 2020-01-28 MED ORDER — PROPOFOL 10 MG/ML IV BOLUS
INTRAVENOUS | Status: AC
  Filled 2020-01-28: qty 20

## 2020-01-28 MED ORDER — ROCURONIUM BROMIDE 10 MG/ML (PF) SYRINGE
PREFILLED_SYRINGE | INTRAVENOUS | Status: DC | PRN
  Administered 2020-01-28: 60 mg via INTRAVENOUS

## 2020-01-28 MED ORDER — HYDROMORPHONE HCL 1 MG/ML IJ SOLN: 0.2500 mg | INTRAMUSCULAR | Status: DC | PRN

## 2020-01-28 MED ORDER — HYDROMORPHONE HCL 1 MG/ML IJ SOLN
INTRAMUSCULAR | Status: AC
  Filled 2020-01-28: qty 1

## 2020-01-28 MED ORDER — PHENYLEPHRINE 40 MCG/ML (10ML) SYRINGE FOR IV PUSH (FOR BLOOD PRESSURE SUPPORT)
PREFILLED_SYRINGE | INTRAVENOUS | Status: DC | PRN
  Administered 2020-01-28: 120 ug via INTRAVENOUS

## 2020-01-28 MED ORDER — HYDROMORPHONE HCL 1 MG/ML IJ SOLN: 0.5000 mg | INTRAMUSCULAR | Status: DC | PRN

## 2020-01-28 MED ORDER — ONDANSETRON HCL 4 MG/2ML IJ SOLN
INTRAMUSCULAR | Status: DC | PRN
  Administered 2020-01-28: 4 mg via INTRAVENOUS

## 2020-01-28 MED ORDER — PROPOFOL 10 MG/ML IV BOLUS
INTRAVENOUS | Status: DC | PRN
  Administered 2020-01-28: 110 mg via INTRAVENOUS

## 2020-01-28 MED ORDER — 0.9 % SODIUM CHLORIDE (POUR BTL) OPTIME
TOPICAL | Status: DC | PRN
  Administered 2020-01-28: 1000 mL

## 2020-01-28 MED ORDER — POLYETHYLENE GLYCOL 3350 17 G PO PACK: 17.0000 g | PACK | Freq: Every day | ORAL | Status: DC | PRN

## 2020-01-28 MED ORDER — OXYCODONE HCL 5 MG PO TABS
ORAL_TABLET | ORAL | Status: AC
  Filled 2020-01-28: qty 1

## 2020-01-28 MED ORDER — ORAL CARE MOUTH RINSE: 15.0000 mL | Freq: Once | OROMUCOSAL | Status: AC

## 2020-01-28 MED ORDER — PANTOPRAZOLE SODIUM 40 MG IV SOLR: 40.0000 mg | Freq: Every day | INTRAVENOUS | Status: DC

## 2020-01-28 MED ORDER — THROMBIN 5000 UNITS EX SOLR
OROMUCOSAL | Status: DC | PRN
  Administered 2020-01-28: 5 mL via TOPICAL

## 2020-01-28 MED ORDER — GLYCOPYRROLATE PF 0.2 MG/ML IJ SOSY
PREFILLED_SYRINGE | INTRAMUSCULAR | Status: DC | PRN
  Administered 2020-01-28: .2 mg via INTRAVENOUS

## 2020-01-28 MED ORDER — LIDOCAINE-EPINEPHRINE 1 %-1:100000 IJ SOLN
INTRAMUSCULAR | Status: DC | PRN
  Administered 2020-01-28: 5 mL

## 2020-01-28 MED ORDER — SUGAMMADEX SODIUM 200 MG/2ML IV SOLN
INTRAVENOUS | Status: DC | PRN
  Administered 2020-01-28: 180 mg via INTRAVENOUS

## 2020-01-28 MED ORDER — BISACODYL 10 MG RE SUPP: 10.0000 mg | Freq: Every day | RECTAL | Status: DC | PRN

## 2020-01-28 MED ORDER — OXYCODONE HCL 5 MG PO TABS
5.0000 mg | ORAL_TABLET | ORAL | Status: DC | PRN
Start: 2020-01-28 — End: 2020-01-28
  Administered 2020-01-28: 5 mg via ORAL

## 2020-01-28 MED ORDER — KCL IN DEXTROSE-NACL 20-5-0.45 MEQ/L-%-% IV SOLN: INTRAVENOUS | Status: DC

## 2020-01-28 MED ORDER — BUPIVACAINE HCL (PF) 0.5 % IJ SOLN
INTRAMUSCULAR | Status: DC | PRN
  Administered 2020-01-28: 5 mL

## 2020-01-28 MED ORDER — BUPIVACAINE LIPOSOME 1.3 % IJ SUSP
20.0000 mL | Freq: Once | INTRAMUSCULAR | Status: DC
  Filled 2020-01-28: qty 20

## 2020-01-28 MED ORDER — ALBUMIN HUMAN 5 % IV SOLN
INTRAVENOUS | Status: AC
  Filled 2020-01-28: qty 250

## 2020-01-28 MED ORDER — MIDAZOLAM HCL 2 MG/2ML IJ SOLN
INTRAMUSCULAR | Status: AC
  Filled 2020-01-28: qty 2

## 2020-01-28 MED ORDER — ROCURONIUM BROMIDE 10 MG/ML (PF) SYRINGE
PREFILLED_SYRINGE | INTRAVENOUS | Status: AC
  Filled 2020-01-28: qty 20

## 2020-01-28 MED ORDER — ALBUMIN HUMAN 5 % IV SOLN
12.5000 g | Freq: Once | INTRAVENOUS | Status: AC
  Administered 2020-01-28: 12.5 g via INTRAVENOUS

## 2020-01-28 MED ORDER — ONDANSETRON HCL 4 MG/2ML IJ SOLN
INTRAMUSCULAR | Status: AC
  Filled 2020-01-28: qty 2

## 2020-01-28 MED ORDER — CEFAZOLIN SODIUM-DEXTROSE 2-4 GM/100ML-% IV SOLN
2.0000 g | Freq: Three times a day (TID) | INTRAVENOUS | Status: AC
  Administered 2020-01-28 – 2020-01-29 (×2): 2 g via INTRAVENOUS
  Filled 2020-01-28 (×2): qty 100

## 2020-01-28 MED ORDER — DEXAMETHASONE SODIUM PHOSPHATE 10 MG/ML IJ SOLN
INTRAMUSCULAR | Status: AC
  Filled 2020-01-28: qty 1

## 2020-01-28 MED ORDER — HYDROCODONE-ACETAMINOPHEN 5-325 MG PO TABS
1.0000 | ORAL_TABLET | ORAL | Status: DC | PRN
  Administered 2020-01-29: 2 via ORAL
  Filled 2020-01-28: qty 2

## 2020-01-28 MED ORDER — BUPIVACAINE HCL (PF) 0.5 % IJ SOLN
INTRAMUSCULAR | Status: AC
  Filled 2020-01-28: qty 30

## 2020-01-28 MED ORDER — FLEET ENEMA 7-19 GM/118ML RE ENEM: 1.0000 | ENEMA | Freq: Once | RECTAL | Status: DC | PRN

## 2020-01-28 MED ORDER — ACETAMINOPHEN 500 MG PO TABS
1000.0000 mg | ORAL_TABLET | Freq: Once | ORAL | Status: AC
  Administered 2020-01-28: 1000 mg via ORAL
  Filled 2020-01-28: qty 2

## 2020-01-28 MED ORDER — CEFAZOLIN SODIUM-DEXTROSE 2-4 GM/100ML-% IV SOLN
2.0000 g | INTRAVENOUS | Status: AC
  Administered 2020-01-28: 2 g via INTRAVENOUS
  Filled 2020-01-28: qty 100

## 2020-01-28 MED ORDER — LIDOCAINE 2% (20 MG/ML) 5 ML SYRINGE
INTRAMUSCULAR | Status: DC | PRN
  Administered 2020-01-28: 60 mg via INTRAVENOUS

## 2020-01-28 MED ORDER — LEVOTHYROXINE SODIUM 75 MCG PO TABS
75.0000 ug | ORAL_TABLET | Freq: Every day | ORAL | Status: DC
  Filled 2020-01-28 (×2): qty 1

## 2020-01-28 MED ORDER — SODIUM CHLORIDE 0.9% FLUSH
3.0000 mL | Freq: Two times a day (BID) | INTRAVENOUS | Status: DC
  Administered 2020-01-28 – 2020-01-30 (×3): 3 mL via INTRAVENOUS

## 2020-01-28 MED ORDER — PHENYLEPHRINE 40 MCG/ML (10ML) SYRINGE FOR IV PUSH (FOR BLOOD PRESSURE SUPPORT)
PREFILLED_SYRINGE | INTRAVENOUS | Status: AC
  Filled 2020-01-28: qty 10

## 2020-01-28 MED ORDER — FENTANYL CITRATE (PF) 250 MCG/5ML IJ SOLN
INTRAMUSCULAR | Status: DC | PRN
  Administered 2020-01-28: 50 ug via INTRAVENOUS
  Administered 2020-01-28: 100 ug via INTRAVENOUS

## 2020-01-28 MED ORDER — MENTHOL 3 MG MT LOZG: 1.0000 | LOZENGE | OROMUCOSAL | Status: DC | PRN

## 2020-01-28 MED ORDER — OXYCODONE HCL 5 MG PO TABS
5.0000 mg | ORAL_TABLET | ORAL | Status: DC | PRN
  Administered 2020-01-28: 5 mg via ORAL
  Administered 2020-01-28: 10 mg via ORAL
  Administered 2020-01-29 (×2): 5 mg via ORAL
  Filled 2020-01-28 (×2): qty 1
  Filled 2020-01-28: qty 2
  Filled 2020-01-28: qty 1

## 2020-01-28 MED ORDER — ACETAMINOPHEN 325 MG PO TABS
650.0000 mg | ORAL_TABLET | ORAL | Status: DC | PRN
  Administered 2020-01-29 – 2020-01-30 (×2): 650 mg via ORAL
  Filled 2020-01-28 (×2): qty 2

## 2020-01-28 MED ORDER — LACTATED RINGERS IV SOLN: INTRAVENOUS | Status: DC

## 2020-01-28 MED ORDER — CHLORHEXIDINE GLUCONATE 0.12 % MT SOLN
15.0000 mL | Freq: Once | OROMUCOSAL | Status: AC
  Administered 2020-01-28: 15 mL via OROMUCOSAL
  Filled 2020-01-28: qty 15

## 2020-01-28 MED ORDER — PHENOL 1.4 % MT LIQD
1.0000 | OROMUCOSAL | Status: DC | PRN
  Administered 2020-01-28: 1 via OROMUCOSAL
  Filled 2020-01-28: qty 177

## 2020-01-28 MED ORDER — ZOLPIDEM TARTRATE 5 MG PO TABS: 5.0000 mg | ORAL_TABLET | Freq: Every evening | ORAL | Status: DC | PRN

## 2020-01-28 MED ORDER — ACETAMINOPHEN 650 MG RE SUPP: 650.0000 mg | RECTAL | Status: DC | PRN

## 2020-01-28 MED ORDER — SUGAMMADEX SODIUM 200 MG/2ML IV SOLN: INTRAVENOUS | Status: DC | PRN

## 2020-01-28 MED ORDER — VITAMIN D 25 MCG (1000 UNIT) PO TABS
2000.0000 [IU] | ORAL_TABLET | Freq: Every day | ORAL | Status: DC
  Filled 2020-01-28 (×2): qty 2

## 2020-01-28 MED ORDER — METHOCARBAMOL 1000 MG/10ML IJ SOLN
500.0000 mg | Freq: Four times a day (QID) | INTRAVENOUS | Status: DC | PRN
  Filled 2020-01-28: qty 5

## 2020-01-28 MED ORDER — PHENYLEPHRINE HCL-NACL 10-0.9 MG/250ML-% IV SOLN
INTRAVENOUS | Status: DC | PRN
  Administered 2020-01-28: 25 ug/min via INTRAVENOUS

## 2020-01-28 MED ORDER — THROMBIN 5000 UNITS EX SOLR
CUTANEOUS | Status: AC
  Filled 2020-01-28: qty 5000

## 2020-01-28 MED ORDER — BUPIVACAINE LIPOSOME 1.3 % IJ SUSP
INTRAMUSCULAR | Status: DC | PRN
  Administered 2020-01-28: 20 mL

## 2020-01-28 MED ORDER — MIDAZOLAM HCL 2 MG/2ML IJ SOLN
INTRAMUSCULAR | Status: DC | PRN
  Administered 2020-01-28: 2 mg via INTRAVENOUS

## 2020-01-28 MED ORDER — SODIUM CHLORIDE 0.9% FLUSH: 3.0000 mL | INTRAVENOUS | Status: DC | PRN

## 2020-01-28 MED ORDER — FENTANYL CITRATE (PF) 250 MCG/5ML IJ SOLN
INTRAMUSCULAR | Status: AC
  Filled 2020-01-28: qty 5

## 2020-01-28 MED ORDER — ONDANSETRON HCL 4 MG PO TABS: 4.0000 mg | ORAL_TABLET | Freq: Four times a day (QID) | ORAL | Status: DC | PRN

## 2020-01-28 MED ORDER — PANTOPRAZOLE SODIUM 40 MG PO TBEC
40.0000 mg | DELAYED_RELEASE_TABLET | Freq: Every day | ORAL | Status: DC
  Administered 2020-01-28 – 2020-01-29 (×2): 40 mg via ORAL
  Filled 2020-01-28 (×2): qty 1

## 2020-01-28 SURGICAL SUPPLY — 80 items
BASKET BONE COLLECTION (BASKET) ×2 IMPLANT
BLADE CLIPPER SURG (BLADE) IMPLANT
BUR MATCHSTICK NEURO 3.0 LAGG (BURR) ×2 IMPLANT
BUR PRECISION FLUTE 5.0 (BURR) ×2 IMPLANT
CAGE COROENT LG 10X9X23-12 (Cage) ×4 IMPLANT
CANISTER SUCT 3000ML PPV (MISCELLANEOUS) ×2 IMPLANT
CARTRIDGE OIL MAESTRO DRILL (MISCELLANEOUS) ×1 IMPLANT
CNTNR URN SCR LID CUP LEK RST (MISCELLANEOUS) ×1 IMPLANT
CONT SPEC 4OZ STRL OR WHT (MISCELLANEOUS) ×2
COVER BACK TABLE 60X90IN (DRAPES) ×2 IMPLANT
COVER WAND RF STERILE (DRAPES) IMPLANT
DECANTER SPIKE VIAL GLASS SM (MISCELLANEOUS) ×2 IMPLANT
DERMABOND ADVANCED (GAUZE/BANDAGES/DRESSINGS) ×1
DERMABOND ADVANCED .7 DNX12 (GAUZE/BANDAGES/DRESSINGS) ×1 IMPLANT
DIFFUSER DRILL AIR PNEUMATIC (MISCELLANEOUS) ×2 IMPLANT
DRAPE C-ARM 42X72 X-RAY (DRAPES) ×2 IMPLANT
DRAPE C-ARMOR (DRAPES) ×2 IMPLANT
DRAPE LAPAROTOMY 100X72X124 (DRAPES) ×2 IMPLANT
DRAPE SURG 17X23 STRL (DRAPES) ×2 IMPLANT
DRSG OPSITE POSTOP 4X6 (GAUZE/BANDAGES/DRESSINGS) ×2 IMPLANT
DURAPREP 26ML APPLICATOR (WOUND CARE) ×2 IMPLANT
ELECT BLADE 4.0 EZ CLEAN MEGAD (MISCELLANEOUS) ×2
ELECT REM PT RETURN 9FT ADLT (ELECTROSURGICAL) ×2
ELECTRODE BLDE 4.0 EZ CLN MEGD (MISCELLANEOUS) ×1 IMPLANT
ELECTRODE REM PT RTRN 9FT ADLT (ELECTROSURGICAL) ×1 IMPLANT
EVACUATOR 1/8 PVC DRAIN (DRAIN) IMPLANT
GAUZE 4X4 16PLY RFD (DISPOSABLE) IMPLANT
GAUZE SPONGE 4X4 12PLY STRL (GAUZE/BANDAGES/DRESSINGS) IMPLANT
GLOVE BIO SURGEON STRL SZ7 (GLOVE) ×2 IMPLANT
GLOVE BIO SURGEON STRL SZ8 (GLOVE) ×8 IMPLANT
GLOVE BIOGEL PI IND STRL 7.5 (GLOVE) ×1 IMPLANT
GLOVE BIOGEL PI IND STRL 8 (GLOVE) ×5 IMPLANT
GLOVE BIOGEL PI IND STRL 8.5 (GLOVE) ×4 IMPLANT
GLOVE BIOGEL PI INDICATOR 7.5 (GLOVE) ×1
GLOVE BIOGEL PI INDICATOR 8 (GLOVE) ×5
GLOVE BIOGEL PI INDICATOR 8.5 (GLOVE) ×4
GLOVE ECLIPSE 7.5 STRL STRAW (GLOVE) ×6 IMPLANT
GLOVE ECLIPSE 8.0 STRL XLNG CF (GLOVE) ×4 IMPLANT
GLOVE EXAM NITRILE XL STR (GLOVE) IMPLANT
GLOVE SS BIOGEL STRL SZ 7 (GLOVE) ×2 IMPLANT
GLOVE SUPERSENSE BIOGEL SZ 7 (GLOVE) ×2
GLOVE SURG SS PI 7.0 STRL IVOR (GLOVE) ×2 IMPLANT
GOWN STRL REUS W/ TWL LRG LVL3 (GOWN DISPOSABLE) IMPLANT
GOWN STRL REUS W/ TWL XL LVL3 (GOWN DISPOSABLE) ×5 IMPLANT
GOWN STRL REUS W/TWL 2XL LVL3 (GOWN DISPOSABLE) ×8 IMPLANT
GOWN STRL REUS W/TWL LRG LVL3 (GOWN DISPOSABLE)
GOWN STRL REUS W/TWL XL LVL3 (GOWN DISPOSABLE) ×10
HEMOSTAT POWDER KIT SURGIFOAM (HEMOSTASIS) ×2 IMPLANT
KIT BASIN OR (CUSTOM PROCEDURE TRAY) ×2 IMPLANT
KIT INFUSE XX SMALL 0.7CC (Orthopedic Implant) ×2 IMPLANT
KIT POSITION SURG JACKSON T1 (MISCELLANEOUS) ×2 IMPLANT
KIT TURNOVER KIT B (KITS) ×2 IMPLANT
MILL MEDIUM DISP (BLADE) ×2 IMPLANT
NEEDLE HYPO 21X1.5 SAFETY (NEEDLE) ×2 IMPLANT
NEEDLE HYPO 25X1 1.5 SAFETY (NEEDLE) ×2 IMPLANT
NEEDLE SPNL 18GX3.5 QUINCKE PK (NEEDLE) ×2 IMPLANT
NS IRRIG 1000ML POUR BTL (IV SOLUTION) ×2 IMPLANT
OIL CARTRIDGE MAESTRO DRILL (MISCELLANEOUS) ×2
PACK LAMINECTOMY NEURO (CUSTOM PROCEDURE TRAY) ×2 IMPLANT
PAD ARMBOARD 7.5X6 YLW CONV (MISCELLANEOUS) ×6 IMPLANT
PATTIES SURGICAL .5 X.5 (GAUZE/BANDAGES/DRESSINGS) IMPLANT
PATTIES SURGICAL .5 X1 (DISPOSABLE) IMPLANT
PATTIES SURGICAL 1X1 (DISPOSABLE) IMPLANT
ROD RELINE LORDOTIC 5.5X45 (Rod) ×2 IMPLANT
ROD RELINE-O LORD 5.5X40 (Rod) ×2 IMPLANT
SCREW LOCK RELINE 5.5 TULIP (Screw) ×8 IMPLANT
SCREW RELINE-O POLY 6.5X45 (Screw) ×8 IMPLANT
SPONGE LAP 4X18 RFD (DISPOSABLE) IMPLANT
SPONGE SURGIFOAM ABS GEL 100 (HEMOSTASIS) IMPLANT
STAPLER SKIN PROX WIDE 3.9 (STAPLE) IMPLANT
SUT VIC AB 1 CT1 18XBRD ANBCTR (SUTURE) ×1 IMPLANT
SUT VIC AB 1 CT1 8-18 (SUTURE) ×2
SUT VIC AB 2-0 CT1 18 (SUTURE) ×2 IMPLANT
SUT VIC AB 3-0 SH 8-18 (SUTURE) ×4 IMPLANT
SYR 20ML LL LF (SYRINGE) ×2 IMPLANT
SYR 5ML LL (SYRINGE) IMPLANT
TOWEL GREEN STERILE (TOWEL DISPOSABLE) ×2 IMPLANT
TOWEL GREEN STERILE FF (TOWEL DISPOSABLE) ×2 IMPLANT
TRAY FOLEY MTR SLVR 16FR STAT (SET/KITS/TRAYS/PACK) ×2 IMPLANT
WATER STERILE IRR 1000ML POUR (IV SOLUTION) ×2 IMPLANT

## 2020-01-28 NOTE — Transfer of Care (Addendum)
Immediate Anesthesia Transfer of Care Note  Patient: Julie May  Procedure(s) Performed: Lumbar Four-Five Posterior lumbar interbody fusion (N/A Back)  Patient Location: PACU  Anesthesia Type:General  Level of Consciousness: drowsy and patient cooperative  Airway & Oxygen Therapy: Patient Spontanous Breathing and Patient connected to face mask oxygen  Post-op Assessment: Report given to RN and Post -op Vital signs reviewed and stable  Post vital signs: Reviewed and stable  Last Vitals:  Vitals Value Taken Time  BP 100/62 01/28/20 1250  Temp    Pulse 69 01/28/20 1251  Resp 16 01/28/20 1251  SpO2 100 % 01/28/20 1251  Vitals shown include unvalidated device data.  Last Pain:  Vitals:   01/28/20 0821  TempSrc:   PainSc: 0-No pain      Patients Stated Pain Goal: 3 (76/72/09 4709)  Complications: No complications documented.

## 2020-01-28 NOTE — Anesthesia Preprocedure Evaluation (Addendum)
Anesthesia Evaluation  Patient identified by MRN, date of birth, ID band Patient awake    Reviewed: Allergy & Precautions, H&P , NPO status , Patient's Chart, lab work & pertinent test results  Airway Mallampati: II  TM Distance: >3 FB Neck ROM: Full    Dental no notable dental hx. (+) Teeth Intact, Dental Advisory Given   Pulmonary neg pulmonary ROS,    Pulmonary exam normal breath sounds clear to auscultation       Cardiovascular hypertension,  Rhythm:Regular Rate:Normal     Neuro/Psych negative neurological ROS  negative psych ROS   GI/Hepatic negative GI ROS, Neg liver ROS,   Endo/Other  Hypothyroidism   Renal/GU negative Renal ROS  negative genitourinary   Musculoskeletal  (+) Arthritis , Osteoarthritis,    Abdominal   Peds  Hematology negative hematology ROS (+)   Anesthesia Other Findings   Reproductive/Obstetrics negative OB ROS                            Anesthesia Physical Anesthesia Plan  ASA: II  Anesthesia Plan: General   Post-op Pain Management:    Induction: Intravenous  PONV Risk Score and Plan: 4 or greater and Ondansetron, Dexamethasone and Midazolam  Airway Management Planned: Oral ETT  Additional Equipment:   Intra-op Plan:   Post-operative Plan: Extubation in OR  Informed Consent: I have reviewed the patients History and Physical, chart, labs and discussed the procedure including the risks, benefits and alternatives for the proposed anesthesia with the patient or authorized representative who has indicated his/her understanding and acceptance.     Dental advisory given  Plan Discussed with: CRNA  Anesthesia Plan Comments:         Anesthesia Quick Evaluation

## 2020-01-28 NOTE — Anesthesia Postprocedure Evaluation (Signed)
Anesthesia Post Note  Patient: Julie May  Procedure(s) Performed: Lumbar Four-Five Posterior lumbar interbody fusion (N/A Back)     Patient location during evaluation: PACU Anesthesia Type: General Level of consciousness: awake and alert Pain management: pain level controlled Vital Signs Assessment: post-procedure vital signs reviewed and stable Respiratory status: spontaneous breathing, nonlabored ventilation and respiratory function stable Cardiovascular status: blood pressure returned to baseline and stable Postop Assessment: no apparent nausea or vomiting Anesthetic complications: no   No complications documented.  Last Vitals:  Vitals:   01/28/20 1414 01/28/20 1454  BP: (!) 106/59 117/65  Pulse:  (!) 56  Resp:  18  Temp:  36.5 C  SpO2:  98%    Last Pain:  Vitals:   01/28/20 1400  TempSrc:   PainSc: 5                  Jeanpierre Thebeau,W. EDMOND

## 2020-01-28 NOTE — Anesthesia Procedure Notes (Signed)
Procedure Name: Intubation Date/Time: 01/28/2020 9:46 AM Performed by: Bryson Corona, CRNA Pre-anesthesia Checklist: Patient identified, Emergency Drugs available, Suction available and Patient being monitored Patient Re-evaluated:Patient Re-evaluated prior to induction Oxygen Delivery Method: Circle System Utilized Preoxygenation: Pre-oxygenation with 100% oxygen Induction Type: IV induction Ventilation: Oral airway inserted - appropriate to patient size Laryngoscope Size: Glidescope and 3 Grade View: Grade I Tube type: Oral Tube size: 7.0 mm Number of attempts: 3 Airway Equipment and Method: Stylet and Oral airway Placement Confirmation: ETT inserted through vocal cords under direct vision,  positive ETCO2 and breath sounds checked- equal and bilateral Secured at: 21 cm Tube secured with: Tape Dental Injury: Teeth and Oropharynx as per pre-operative assessment  Difficulty Due To: Difficult Airway- due to anterior larynx Comments: DL x 1: Grade 3 with MAC 3. Unable to pass ETT. DLx 2 by Dr. Therisa Doyne. Grade 3 with MAC 3 with esophageal intubation. DLx 3: Grade 1 view with glidescope size 3. Pt has short chin and anterior airway

## 2020-01-28 NOTE — Brief Op Note (Signed)
01/28/2020  12:50 PM  PATIENT:  Julie May  69 y.o. female  PRE-OPERATIVE DIAGNOSIS:  Lumbar stenosis with neurogenic claudication, lumbar spondylolisthesis, lumbago, synovial cyst, radiculopathy L 45 level  POST-OPERATIVE DIAGNOSIS:  Lumbar stenosis with neurogenic claudication, lumbar spondylolisthesis, lumbago, synovial cyst, radiculopathy L 45 level  PROCEDURE:  Procedure(s) with comments: Lumbar Four-Five Posterior lumbar interbody fusion (N/A) - Lumbar Four-Five Posterior lumbar interbody fusion with resection of synovial cyst, pedicle screw fixation, posterolateral arthrodesis  SURGEON:  Surgeon(s) and Role:    Erline Levine, MD - Primary  PHYSICIAN ASSISTANT:   ASSISTANTS: Glenford Peers, NP   Poteat, RN   ANESTHESIA:   general  EBL:  250 mL   BLOOD ADMINISTERED:none  DRAINS: none   LOCAL MEDICATIONS USED:  MARCAINE    and LIDOCAINE   SPECIMEN:  No Specimen  DISPOSITION OF SPECIMEN:  N/A  COUNTS:  YES  TOURNIQUET:  * No tourniquets in log *  DICTATION: Patient is 69 year old woman with 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 She has a synovial cyst compressing her thecal sac.  She has a severe right L5 radiculopathy. It was elected to take her to surgery for decompression and fusion at this level.   Procedure: Patient was placed in a prone position on the [place] table after smooth and uncomplicated induction of general endotracheal anesthesia. Her low back was prepped and draped in usual sterile fashion with betadine scrub and DuraPrep after preoperative localizing X ray with C arm. Area of incision was infiltrated with local lidocaine. Incision was made to the lumbodorsal fascia was incised and exposure was performed of the L4 through L5 spinous processes laminae facet joint and transverse processes. Intraoperative x-ray was obtained which confirmed correct orientation. A total laminectomy of L4 was performed with disarticulation of the facet joints  at this level and thorough decompression was performed of both L4 and L5 nerve roots along with the common dural tube. This decompression was more involved than would be typical of that performed for PLIF alone and included painstaking dissection of adherent ligament compressing the thecal sac and wide decompression of all neural elements including resection of adherent synovial cyst. A thorough discectomy was initially performed on the left with preparation of the endplates for grafting a trial spacer was placed this level and a thorough discectomy was performed on the right as well.  Bilateral median 10 x 9 x 23  mm x 12 degree peek cages were packed with extra extra small BMP and autograft and was inserted the interspace and countersunk appropriately along with 6 cc of morselized bone autograft medial to the second cage. The posterolateral region was extensively decorticated and pedicle probes were placed at L4 and L5 bilaterally. Intraoperative fluoroscopy confirmed correct orientationin the AP and lateral plane. 45 x 6.5 mm pedicle screws were placed at L5 bilaterally and 45 x 6.5 mm screws placed at L4 bilaterally final x-rays demonstrated well-positioned interbody grafts and pedicle screw fixation. A 40 mm lordotic rod was placed on the right and a 45 mm rod was placed on the left locked down in situ and the posterolateral region was packed with the remaining 10 cc bone autograft bilaterally. The wound was irrigated and long-acting Marcaine was injected in the deep musculature. Fascia was closed with 1 Vicryl sutures skin edges were reapproximated 2 and 3-0 Vicryl sutures. The wound was dressed with Dermabond and an occlusive dressing,  The patient was extubated in the operating room and taken to recovery in  stable satisfactory condition. She tolerated the operation well counts were correct at the end of the case.   PLAN OF CARE: Admit to inpatient   PATIENT DISPOSITION:  PACU - hemodynamically stable.    Delay start of Pharmacological VTE agent (>24hrs) due to surgical blood loss or risk of bleeding: yes

## 2020-01-28 NOTE — Op Note (Signed)
01/28/2020  12:50 PM  PATIENT:  Julie May  69 y.o. female  PRE-OPERATIVE DIAGNOSIS:  Lumbar stenosis with neurogenic claudication, lumbar spondylolisthesis, lumbago, synovial cyst, radiculopathy L 45 level  POST-OPERATIVE DIAGNOSIS:  Lumbar stenosis with neurogenic claudication, lumbar spondylolisthesis, lumbago, synovial cyst, radiculopathy L 45 level  PROCEDURE:  Procedure(s) with comments: Lumbar Four-Five Posterior lumbar interbody fusion (N/A) - Lumbar Four-Five Posterior lumbar interbody fusion with resection of synovial cyst, pedicle screw fixation, posterolateral arthrodesis  SURGEON:  Surgeon(s) and Role:    Erline Levine, MD - Primary  PHYSICIAN ASSISTANT:   ASSISTANTS: Glenford Peers, NP   Poteat, RN   ANESTHESIA:   general  EBL:  250 mL   BLOOD ADMINISTERED:none  DRAINS: none   LOCAL MEDICATIONS USED:  MARCAINE    and LIDOCAINE   SPECIMEN:  No Specimen  DISPOSITION OF SPECIMEN:  N/A  COUNTS:  YES  TOURNIQUET:  * No tourniquets in log *  DICTATION: Patient is 69 year old woman with 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 She has a synovial cyst compressing her thecal sac.  She has a severe right L5 radiculopathy. It was elected to take her to surgery for decompression and fusion at this level.   Procedure: Patient was placed in a prone position on the [place] table after smooth and uncomplicated induction of general endotracheal anesthesia. Her low back was prepped and draped in usual sterile fashion with betadine scrub and DuraPrep after preoperative localizing X ray with C arm. Area of incision was infiltrated with local lidocaine. Incision was made to the lumbodorsal fascia was incised and exposure was performed of the L4 through L5 spinous processes laminae facet joint and transverse processes. Intraoperative x-ray was obtained which confirmed correct orientation. A total laminectomy of L4 was performed with disarticulation of the facet joints  at this level and thorough decompression was performed of both L4 and L5 nerve roots along with the common dural tube. This decompression was more involved than would be typical of that performed for PLIF alone and included painstaking dissection of adherent ligament compressing the thecal sac and wide decompression of all neural elements including resection of adherent synovial cyst. A thorough discectomy was initially performed on the left with preparation of the endplates for grafting a trial spacer was placed this level and a thorough discectomy was performed on the right as well.  Bilateral median 10 x 9 x 23  mm x 12 degree peek cages were packed with extra extra small BMP and autograft and was inserted the interspace and countersunk appropriately along with 6 cc of morselized bone autograft medial to the second cage. The posterolateral region was extensively decorticated and pedicle probes were placed at L4 and L5 bilaterally. Intraoperative fluoroscopy confirmed correct orientationin the AP and lateral plane. 45 x 6.5 mm pedicle screws were placed at L5 bilaterally and 45 x 6.5 mm screws placed at L4 bilaterally final x-rays demonstrated well-positioned interbody grafts and pedicle screw fixation. A 40 mm lordotic rod was placed on the right and a 45 mm rod was placed on the left locked down in situ and the posterolateral region was packed with the remaining 10 cc bone autograft bilaterally. The wound was irrigated and long-acting Marcaine was injected in the deep musculature. Fascia was closed with 1 Vicryl sutures skin edges were reapproximated 2 and 3-0 Vicryl sutures. The wound was dressed with Dermabond and an occlusive dressing,  The patient was extubated in the operating room and taken to recovery in  stable satisfactory condition. She tolerated the operation well counts were correct at the end of the case.   PLAN OF CARE: Admit to inpatient   PATIENT DISPOSITION:  PACU - hemodynamically stable.    Delay start of Pharmacological VTE agent (>24hrs) due to surgical blood loss or risk of bleeding: yes

## 2020-01-28 NOTE — Progress Notes (Signed)
Orthopedic Tech Progress Note Patient Details:  [name] August 10, 1950 [id number] PER MD patient has brace Patient ID: Julie May, female   DOB: 04/08/1951, 69 y.o.   MRN: [id number]   Janit Pagan 01/28/2020, 3:23 PM

## 2020-01-28 NOTE — Interval H&P Note (Signed)
History and Physical Interval Note:  01/28/2020 7:31 AM  Julie May  has presented today for surgery, with the diagnosis of Lumbar stenosis with neurogenic claudication.  The various methods of treatment have been discussed with the patient and family. After consideration of risks, benefits and other options for treatment, the patient has consented to  Procedure(s) with comments: Lumbar 4-5 Posterior lumbar interbody fusion (N/A) - 3C/RM 18 as a surgical intervention.  The patient's history has been reviewed, patient examined, no change in status, stable for surgery.  I have reviewed the patient's chart and labs.  Questions were answered to the patient's satisfaction.     Peggyann Shoals

## 2020-01-29 DIAGNOSIS — M48062 Spinal stenosis, lumbar region with neurogenic claudication: Secondary | ICD-10-CM | POA: Diagnosis not present

## 2020-01-29 LAB — CBC WITH DIFFERENTIAL/PLATELET
Abs Immature Granulocytes: 0.04 10*3/uL (ref 0.00–0.07)
Basophils Absolute: 0 10*3/uL (ref 0.0–0.1)
Basophils Relative: 0 %
Eosinophils Absolute: 0 10*3/uL (ref 0.0–0.5)
Eosinophils Relative: 0 %
HCT: 33.8 % — ABNORMAL LOW (ref 36.0–46.0)
Hemoglobin: 11.3 g/dL — ABNORMAL LOW (ref 12.0–15.0)
Immature Granulocytes: 0 %
Lymphocytes Relative: 19 %
Lymphs Abs: 1.9 10*3/uL (ref 0.7–4.0)
MCH: 28.3 pg (ref 26.0–34.0)
MCHC: 33.4 g/dL (ref 30.0–36.0)
MCV: 84.5 fL (ref 80.0–100.0)
Monocytes Absolute: 1.1 10*3/uL — ABNORMAL HIGH (ref 0.1–1.0)
Monocytes Relative: 10 %
Neutro Abs: 7.2 10*3/uL (ref 1.7–7.7)
Neutrophils Relative %: 71 %
Platelets: 210 10*3/uL (ref 150–400)
RBC: 4 MIL/uL (ref 3.87–5.11)
RDW: 13.2 % (ref 11.5–15.5)
WBC: 10.3 10*3/uL (ref 4.0–10.5)
nRBC: 0 % (ref 0.0–0.2)

## 2020-01-29 LAB — BASIC METABOLIC PANEL
Anion gap: 9 (ref 5–15)
BUN: 12 mg/dL (ref 8–23)
CO2: 24 mmol/L (ref 22–32)
Calcium: 8.5 mg/dL — ABNORMAL LOW (ref 8.9–10.3)
Chloride: 90 mmol/L — ABNORMAL LOW (ref 98–111)
Creatinine, Ser: 0.84 mg/dL (ref 0.44–1.00)
GFR calc Af Amer: >60 mL/min (ref >60)
GFR calc non Af Amer: >60 mL/min (ref >60)
Glucose, Bld: 125 mg/dL — ABNORMAL HIGH (ref 70–99)
Potassium: 3.6 mmol/L (ref 3.5–5.1)
Sodium: 123 mmol/L — ABNORMAL LOW (ref 135–145)

## 2020-01-29 MED ORDER — SODIUM CHLORIDE 0.9 % IV SOLN: INTRAVENOUS | Status: DC

## 2020-01-29 MED ORDER — IBUPROFEN 200 MG PO TABS
600.0000 mg | ORAL_TABLET | Freq: Four times a day (QID) | ORAL | Status: DC | PRN
  Filled 2020-01-29: qty 3

## 2020-01-29 MED ORDER — SODIUM CHLORIDE 0.9 % IV SOLN: Freq: Once | INTRAVENOUS | Status: AC

## 2020-01-29 MED ORDER — HYDROCODONE-ACETAMINOPHEN 5-325 MG PO TABS: 1.0000 | ORAL_TABLET | ORAL | 0 refills | Status: DC | PRN

## 2020-01-29 MED ORDER — METHOCARBAMOL 500 MG PO TABS: 500.0000 mg | ORAL_TABLET | Freq: Four times a day (QID) | ORAL | 0 refills | Status: DC | PRN

## 2020-01-29 MED ORDER — HYDROCODONE-ACETAMINOPHEN 5-325 MG PO TABS: 1.0000 | ORAL_TABLET | ORAL | Status: DC | PRN

## 2020-01-29 MED ORDER — TRAMADOL HCL 50 MG PO TABS: 100.0000 mg | ORAL_TABLET | Freq: Four times a day (QID) | ORAL | Status: DC | PRN

## 2020-01-29 MED ORDER — METHOCARBAMOL 500 MG PO TABS
500.0000 mg | ORAL_TABLET | Freq: Four times a day (QID) | ORAL | Status: DC | PRN
  Administered 2020-01-30: 500 mg via ORAL
  Filled 2020-01-29: qty 1

## 2020-01-29 MED FILL — HYDROCODON-APAP 5-325: 5-325 | 5 days supply | Qty: 30 | Fill #0

## 2020-01-29 NOTE — Evaluation (Signed)
Occupational Therapy Evaluation and Discharge Patient Details Name: Julie May MRN: [id number] DOB: 1950-07-26 Today's Date: 01/29/2020    History of Present Illness Pt is a 69 year old woman admitted for L4-5 PLIF. PMH: hypothyroidism, HTN, arthritis.   Clinical Impression   All education completed as detailed below with pt verbalizing and/or demonstrating understanding. No further OT or DME needs.     Follow Up Recommendations  No OT follow up    Equipment Recommendations  None recommended by OT    Recommendations for Other Services       Precautions / Restrictions Precautions Precautions: Back;Fall Precaution Booklet Issued: Yes (comment) Precaution Comments: reviewed back precautions verbally and reinforced with written handout Required Braces or Orthoses: Spinal Brace Spinal Brace: Lumbar corset;Applied in sitting position Restrictions Weight Bearing Restrictions: No      Mobility Bed Mobility Overal bed mobility: Modified Independent             General bed mobility comments: used log roll method, rail  Transfers Overall transfer level: Needs assistance Equipment used: Rolling walker (2 wheeled) Transfers: Sit to/from Stand Sit to Stand: Supervision         General transfer comment: instructed to widen BoS with sit<>stand    Balance                                           ADL either performed or assessed with clinical judgement   ADL Overall ADL's : Needs assistance/impaired Eating/Feeding: Independent   Grooming: Supervision/safety;Standing Grooming Details (indicate cue type and reason): instructed in 2 cup method for tooth brushing and use of washcloth on face Upper Body Bathing: Minimal assistance;Sitting Upper Body Bathing Details (indicate cue type and reason): recommended long handled bath sponge for back Lower Body Bathing: Supervison/ safety;Sit to/from stand   Upper Body Dressing : Set up;Sitting   Lower Body  Dressing: Supervision/safety;Sit to/from stand Lower Body Dressing Details (indicate cue type and reason): pt able to perform figure 4 to reach feet Toilet Transfer: Supervision/safety;Ambulation;RW   Toileting- Clothing Manipulation and Hygiene: Supervision/safety;Sit to/from stand Toileting - Clothing Manipulation Details (indicate cue type and reason): educated in avoiding twisting with pericare     Functional mobility during ADLs: Supervision/safety;Rolling walker General ADL Comments: Educated on IADL to avoid.     Vision Patient Visual Report: No change from baseline       Perception     Praxis      Pertinent Vitals/Pain Pain Assessment: 0-10 Pain Location: 3 Pain Descriptors / Indicators: Operative site guarding Pain Intervention(s): Monitored during session     Hand Dominance Right   Extremity/Trunk Assessment Upper Extremity Assessment Upper Extremity Assessment: Defer to OT evaluation   Lower Extremity Assessment Lower Extremity Assessment: Defer to PT evaluation       Communication Communication Communication: No difficulties   Cognition Arousal/Alertness: Awake/alert Behavior During Therapy: WFL for tasks assessed/performed Overall Cognitive Status: Within Functional Limits for tasks assessed                                     General Comments       Exercises     Shoulder Instructions      Home Living Family/patient expects to be discharged to:: Private residence Living Arrangements: Spouse/significant other Available Help at Discharge: Family;Available 24  hours/day Type of Home: House Home Access: Stairs to enter [employer] of Steps: 1+1 Entrance Stairs-Rails: Right Home Layout: Two level;Able to live on main level with bedroom/bathroom     Bathroom Shower/Tub: Teacher, early years/pre: Standard     Home Equipment: Environmental consultant - 2 wheels;Toilet riser;Hand held shower head;Adaptive equipment;Tub  bench Adaptive Equipment: Reacher        Prior Functioning/Environment Level of Independence: Independent                 OT Problem List:        OT Treatment/Interventions:      OT Goals(Current goals can be found in the care plan section) Acute Rehab OT Goals Patient Stated Goal: return home  OT Frequency:     Barriers to D/C:            Co-evaluation              AM-PAC OT "6 Clicks" Daily Activity     Outcome Measure Help from another person eating meals?: None Help from another person taking care of personal grooming?: A Little Help from another person toileting, which includes using toliet, bedpan, or urinal?: A Little Help from another person bathing (including washing, rinsing, drying)?: A Little Help from another person to put on and taking off regular upper body clothing?: None Help from another person to put on and taking off regular lower body clothing?: A Little 6 Click Score: 20   End of Session Equipment Utilized During Treatment: Gait belt;Rolling walker;Back brace  Activity Tolerance: Patient tolerated treatment well Patient left: in chair;with call bell/phone within reach  OT Visit Diagnosis: Pain;Other abnormalities of gait and mobility (R26.89)                Time: 4967-5916 OT Time Calculation (min): 21 min Charges:  OT General Charges $OT Visit: 1 Visit OT Evaluation $OT Eval Low Complexity: 1 Low  Nestor Lewandowsky, OTR/L Acute Rehabilitation Services Pager: [phone] Office: [phone]  Malka So 01/29/2020, 9:35 AM

## 2020-01-29 NOTE — Discharge Instructions (Signed)
Wound Care Remove outer dressing in 2 days Leave incision open to air. You may shower. Do not scrub directly on incision.  Do not put any creams, lotions, or ointments on incision. The Glue will falls off Activity Walk each and every day, increasing distance each day. No lifting greater than 5 lbs.  Avoid bending, arching, and twisting. No driving for 2 weeks; may ride as a passenger locally. If provided with back brace, wear when out of bed.  It is not necessary to wear in bed. Diet Resume your normal diet.  Return to Work Will be discussed at you follow up appointment. Call Your Doctor If Any of These Occur Redness, drainage, or swelling at the wound.  Temperature greater than 101 degrees. Severe pain not relieved by pain medication. Incision starts to come apart. Follow Up Appt Call today for appointment in 2-3 weeks ([phone]) or for problems.  If you have any hardware placed in your spine, you will need an x-ray before your appointment.

## 2020-01-29 NOTE — Evaluation (Signed)
Physical Therapy Evaluation Patient Details Name: Julie May MRN: [id number] DOB: [birth year] Today's Date: 01/29/2020   History of Present Illness  Pt is a 69 year old woman admitted for L4-5 PLIF. PMH: hypothyroidism, HTN, arthritis.  Clinical Impression  Patient is s/p above surgery resulting in the deficits listed below (see PT Problem List). Pt tolerating mobility well, requires RW for safe, optimal gait pattern, and increased stability during amb. Pt with poor recall of back precautions, spouse present and had verbal understanding of precautions as well.Patient will benefit from skilled PT to increase their independence and safety with mobility (while adhering to their precautions) to allow discharge to the venue listed below.     Follow Up Recommendations No PT follow up;Supervision for mobility/OOB    Equipment Recommendations  Rolling walker with 5" wheels    Recommendations for Other Services       Precautions / Restrictions Precautions Precautions: Back;Fall Precaution Booklet Issued: Yes (comment) Precaution Comments: pt unable to recall precautions, pt re-education, spouse present as well Required Braces or Orthoses: Spinal Brace Spinal Brace: Lumbar corset;Applied in sitting position Restrictions Weight Bearing Restrictions: No      Mobility  Bed Mobility Overal bed mobility: Modified Independent             General bed mobility comments: pt up in chair upon PT arrival, pt able to explain log roll technique  Transfers Overall transfer level: Needs assistance Equipment used: Rolling walker (2 wheeled) Transfers: Sit to/from Stand Sit to Stand: Supervision         General transfer comment: increased time, verbal cues to minimize trunk flexion and to push up from arm rests not pull up on chair  Ambulation/Gait Ambulation/Gait assistance: Min guard Gait Distance (Feet): 200 Feet Assistive device: Rolling walker (2 wheeled) Gait Pattern/deviations:  Step-through pattern;Decreased stride length;Decreased dorsiflexion - right;Decreased dorsiflexion - left Gait velocity: slow Gait velocity interpretation: 1.31 - 2.62 ft/sec, indicative of limited community ambulator General Gait Details: pt with bilat foot weakness from previous condition  Stairs Stairs: Yes Stairs assistance: Min guard Stair Management: One rail Right;Step to pattern;Sideways Number of Stairs: 3 General stair comments: pt with good technique, spouse present to observe as well  Wheelchair Mobility    Modified Rankin (Stroke Patients Only)       Balance Overall balance assessment: Needs assistance (needs RW for safe ambulation) Sitting-balance support: Feet supported;No upper extremity supported Sitting balance-Leahy Scale: Good                                       Pertinent Vitals/Pain Pain Assessment: 0-10 Pain Score: 3  Pain Location: 3 Pain Descriptors / Indicators: Operative site guarding Pain Intervention(s): Monitored during session    Home Living Family/patient expects to be discharged to:: Private residence Living Arrangements: Spouse/significant other Available Help at Discharge: Family;Available 24 hours/day Type of Home: House Home Access: Stairs to enter Entrance Stairs-Rails: Right Entrance Stairs-Number of Steps: 1+1 Home Layout: Two level;Able to live on main level with bedroom/bathroom Home Equipment: Toilet riser;Hand held shower head;Adaptive equipment;Tub bench      Prior Function Level of Independence: Independent               Hand Dominance   Dominant Hand: Right    Extremity/Trunk Assessment   Upper Extremity Assessment Upper Extremity Assessment: Defer to OT evaluation    Lower Extremity Assessment Lower Extremity Assessment: Generalized  weakness (residual weakness from previous condition, must wear shoes)    Cervical / Trunk Assessment Cervical / Trunk Assessment: Other  exceptions Cervical / Trunk Exceptions: back surgery  Communication   Communication: No difficulties  Cognition Arousal/Alertness: Awake/alert Behavior During Therapy: WFL for tasks assessed/performed Overall Cognitive Status: Within Functional Limits for tasks assessed                                 General Comments: poor recall of precautions      General Comments General comments (skin integrity, edema, etc.): VSS, denies dizziness    Exercises     Assessment/Plan    PT Assessment Patient needs continued PT services  PT Problem List Decreased strength;Decreased range of motion;Decreased activity tolerance;Decreased balance;Decreased mobility;Decreased coordination;Decreased knowledge of use of DME       PT Treatment Interventions DME instruction;Gait training;Stair training;Functional mobility training;Therapeutic activities;Therapeutic exercise;Balance training    PT Goals (Current goals can be found in the Care Plan section)  Acute Rehab PT Goals Patient Stated Goal: return home PT Goal Formulation: With patient Time For Goal Achievement: 02/12/20 Potential to Achieve Goals: Good    Frequency Min 5X/week   Barriers to discharge        Co-evaluation               AM-PAC PT "6 Clicks" Mobility  Outcome Measure Help needed turning from your back to your side while in a flat bed without using bedrails?: A Little Help needed moving from lying on your back to sitting on the side of a flat bed without using bedrails?: A Little Help needed moving to and from a bed to a chair (including a wheelchair)?: A Little Help needed standing up from a chair using your arms (e.g., wheelchair or bedside chair)?: A Little Help needed to walk in hospital room?: A Little Help needed climbing 3-5 steps with a railing? : A Little 6 Click Score: 18    End of Session Equipment Utilized During Treatment: Gait belt;Back brace Activity Tolerance: Patient tolerated  treatment well Patient left: in chair;with call bell/phone within reach;with family/visitor present Nurse Communication: Mobility status PT Visit Diagnosis: Unsteadiness on feet (R26.81);Muscle weakness (generalized) (M62.81);Difficulty in walking, not elsewhere classified (R26.2)    Time: 7078-6754 PT Time Calculation (min) (ACUTE ONLY): 19 min   Charges:   PT Evaluation $PT Eval Moderate Complexity: 1 Mod          Julie May, PT, DPT Acute Rehabilitation Services Pager #: [phone] Office #: [phone]   Berline Lopes 01/29/2020, 12:15 PM

## 2020-01-29 NOTE — Progress Notes (Signed)
Patient called staff for assistance stated' I feel woozy" staff went in room and assist patient and vitals was taking. Patient was unresponsive for few minutes and oxygen was applied via [place] on 2L. Patient became alert and oriented and was placed back in bed. Spouse  At bedside. MD notified and orders received. Will continue to monitor patient status.

## 2020-01-29 NOTE — Care Management Obs Status (Signed)
[facility] NOTIFICATION   Patient Details  Name: DAMYIAH MOXLEY MRN: [id number] Date of Birth: [birth year]   Medicare Observation Status Notification Given:  Yes    Benard Halsted, LCSW 01/29/2020, 3:29 PM

## 2020-01-29 NOTE — Progress Notes (Signed)
Subjective: Patient reports that she is doing well this morning. Pain is a 3/10 and is much improved from her pre operative pain. She does report "fealing a little weak and out of it after taking an oxycodone early this morning." No other complaints or acute overnight events.  Objective: Vital signs in last 24 hours: Temp:  [97.5 F (36.4 C)-98.5 F (36.9 C)] 97.5 F (36.4 C) (07/21 0728) Pulse Rate:  [54-73] 67 (07/21 0728) Resp:  [13-25] 16 (07/21 0728) BP: (77-117)/(57-77) 113/66 (07/21 0728) SpO2:  [91 %-100 %] 100 % (07/21 0728)  Intake/Output from previous day: 07/20 0701 - 07/21 0700 In: 1983 [P.O.:480; I.V.:1503] Out: 1000 [Urine:750; Blood:250] Intake/Output this shift: No intake/output data recorded.  Physical Exam: Patient is conversant and A/O X4. She MAEW with good strength that is symmetric bilaterally. Sensation is intact. Dressing is CDI without swelling, drainage, or erythema.    Lab Results: No results for input(s): WBC, HGB, HCT, PLT in the last 72 hours. BMET No results for input(s): NA, K, CL, CO2, GLUCOSE, BUN, CREATININE, CALCIUM in the last 72 hours.  Studies/Results: DG Lumbar Spine 2-3 Views  Result Date: 01/28/2020 CLINICAL DATA:  Surgical posterior fusion of L4-5. EXAM: DG C-ARM 1-60 MIN; LUMBAR SPINE - 2-3 VIEW CONTRAST:  None. FLUOROSCOPY TIME:  Radiation Exposure Index (if provided by the fluoroscopic device): 21 mGy. COMPARISON:  January 20, 2020. FINDINGS: Two intraoperative fluoroscopic images of the lower lumbar spine demonstrate the patient be status post surgical posterior fusion of L4-5 with bilateral intrapedicular screw placement and interbody fusion. IMPRESSION: Status post surgical posterior fusion of L4-5. Electronically Signed   By: Marijo Conception M.D.   On: 01/28/2020 14:31   DG C-Arm 1-60 Min  Result Date: 01/28/2020 CLINICAL DATA:  Surgical posterior fusion of L4-5. EXAM: DG C-ARM 1-60 MIN; LUMBAR SPINE - 2-3 VIEW CONTRAST:  None.  FLUOROSCOPY TIME:  Radiation Exposure Index (if provided by the fluoroscopic device): 21 mGy. COMPARISON:  January 20, 2020. FINDINGS: Two intraoperative fluoroscopic images of the lower lumbar spine demonstrate the patient be status post surgical posterior fusion of L4-5 with bilateral intrapedicular screw placement and interbody fusion. IMPRESSION: Status post surgical posterior fusion of L4-5. Electronically Signed   By: Marijo Conception M.D.   On: 01/28/2020 14:31    Assessment/Plan: The patient reports that she is doing well and her preoperative symptoms are much improved. She ambulated well with nursing staff last night but has yet to work with therapies. Change pain medication from oxycodone to hydrocodone to see if it is better tolerated. We will evaluate her readiness for discharge after changing pain medication and working with therapies. LSO brace when OOB. Continue to mobilize and ambulate patient.     LOS: 1 day    Peggyann Shoals, MD 01/29/2020, 8:08 AM

## 2020-01-29 NOTE — Care Management CC44 (Signed)
Condition Code 44 Documentation Completed  Patient Details  Name: Julie May MRN: [id number] Date of Birth: 06/30/1951   Condition Code 44 given:  Yes Patient signature on Condition Code 44 notice:  Yes Documentation of 2 MD's agreement:  Yes Code 44 added to claim:  Yes    Benard Halsted, LCSW 01/29/2020, 3:29 PM

## 2020-01-29 NOTE — Discharge Summary (Addendum)
Physician Discharge Summary  Patient ID: FRAIDY MCCARRICK MRN: [id number] DOB/AGE: July 11, 1951 69 y.o.  Admit date: 01/28/2020 Discharge date: 01/29/2020  Admission Diagnoses: Lumbar stenosis with neurogenic claudication, lumbar spondylolisthesis, lumbago, synovial cyst, radiculopathy L 45 level  Discharge Diagnoses: Lumbar stenosis with neurogenic claudication, lumbar spondylolisthesis, lumbago, synovial cyst, radiculopathy L 45 level  Active Problems:   Spondylolisthesis of lumbar region   Discharged Condition: good  Hospital Course: Patient with 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 She has a synovial cyst compressing her thecal sac.  She has a severe right L5 radiculopathy. It was elected to take her to surgery for decompression and fusion at this level. After a successful surgery, the patient was extubated in the OR and then taken to the PACU in stable condition for recovery. She was then transferred to [facility] for observation. She worked with therapies who have signed off and is ready to be discharged home.  Consults: None  Significant Diagnostic Studies: radiology: X-ray  Treatments: surgery: Lumbar Four-Five Posterior lumbar interbody fusion (N/A) - Lumbar Four-Five Posterior lumbar interbody fusion with resection of synovial cyst, pedicle screw fixation, posterolateral arthrodesis  Discharge Exam: Blood pressure 99/65, pulse 66, temperature 97.6 F (36.4 C), temperature source Oral, resp. rate 16, height 5\' 5"  (1.651 m), weight 74.8 kg, SpO2 99 %. Neurologic: Alert and oriented X 3, normal strength and tone. Normal symmetric reflexes. Normal coordination and gait Mental status: Alert, oriented, thought content appropriate Sensory: normal Motor: Improved:    Patient is much improved and has no current complaints. She mobilized well with therapy. She is conversant, A/OX3. MAEW with good strength that is equal bilaterally. Dressing is CDI.     Disposition: Discharge  to home   Allergies as of 01/29/2020   No Known Allergies     Medication List    STOP taking these medications   aspirin 325 MG EC tablet   ibuprofen 200 MG tablet Commonly known as: ADVIL     TAKE these medications   Tirosint 75 MCG Caps Generic drug: Levothyroxine Sodium Take 1 capsule (75 mcg total) by mouth daily before breakfast.   Vitamin D 50 MCG (2000 UT) Caps Take 2,000 Units by mouth daily with lunch.        Signed: Peggyann Shoals, MD 01/29/2020, 2:36 PM

## 2020-01-30 DIAGNOSIS — M48062 Spinal stenosis, lumbar region with neurogenic claudication: Secondary | ICD-10-CM | POA: Diagnosis not present

## 2020-01-30 LAB — BASIC METABOLIC PANEL WITH GFR
Anion gap: 6 (ref 5–15)
BUN: 6 mg/dL — ABNORMAL LOW (ref 8–23)
CO2: 25 mmol/L (ref 22–32)
Calcium: 8.4 mg/dL — ABNORMAL LOW (ref 8.9–10.3)
Chloride: 101 mmol/L (ref 98–111)
Creatinine, Ser: 0.78 mg/dL (ref 0.44–1.00)
GFR calc Af Amer: >60 mL/min (ref >60)
GFR calc non Af Amer: >60 mL/min (ref >60)
Glucose, Bld: 105 mg/dL — ABNORMAL HIGH (ref 70–99)
Potassium: 3.9 mmol/L (ref 3.5–5.1)
Sodium: 132 mmol/L — ABNORMAL LOW (ref 135–145)

## 2020-01-30 MED FILL — METHOCARBAMOL 500 MG TABS: 500 | 7 days supply | Qty: 30 | Fill #0

## 2020-01-30 NOTE — Progress Notes (Signed)
Physical Therapy Treatment Patient Details Name: Julie May MRN: [id number] DOB: 10-Nov-1950 Today's Date: 01/30/2020    History of Present Illness Pt is a 69 year old woman admitted for L4-5 PLIF. PMH: hypothyroidism, HTN, arthritis.    PT Comments    Pt progressing well with post-op mobility. She was able to demonstrate transfers and ambulation with up to min guard assist for safety and RW for support. Pt was educated on precautions, brace application/wearing schedule, appropriate activity progression, and car transfer. Will continue to follow.      Follow Up Recommendations  No PT follow up;Supervision for mobility/OOB     Equipment Recommendations  Rolling walker with 5" wheels    Recommendations for Other Services       Precautions / Restrictions Precautions Precautions: Back;Fall Precaution Booklet Issued: Yes (comment) Precaution Comments: Pt was cued for precautions during functional mobility.  Required Braces or Orthoses: Spinal Brace Spinal Brace: Lumbar corset;Applied in sitting position Restrictions Weight Bearing Restrictions: No    Mobility  Bed Mobility Overal bed mobility: Modified Independent             General bed mobility comments: HOB flat and rails lowered to simulate home environment.   Transfers Overall transfer level: Needs assistance Equipment used: Rolling walker (2 wheeled) Transfers: Sit to/from Stand Sit to Stand: Supervision         General transfer comment: VC's for wider BOS and for hand placement on seated surface for safety.   Ambulation/Gait Ambulation/Gait assistance: Min guard Gait Distance (Feet): 300 Feet Assistive device: Rolling walker (2 wheeled) Gait Pattern/deviations: Step-through pattern;Decreased stride length;Decreased dorsiflexion - right;Decreased dorsiflexion - left Gait velocity: Decreased Gait velocity interpretation: <1.31 ft/sec, indicative of household ambulator General Gait Details: pt with bilat  foot weakness from previous condition   Stairs             Wheelchair Mobility    Modified Rankin (Stroke Patients Only)       Balance Overall balance assessment: Needs assistance Sitting-balance support: Feet supported;No upper extremity supported Sitting balance-Leahy Scale: Good     Standing balance support: Single extremity supported;During functional activity Standing balance-Leahy Scale: Poor Standing balance comment: needs RW for safe ambulation                            Cognition Arousal/Alertness: Awake/alert Behavior During Therapy: WFL for tasks assessed/performed Overall Cognitive Status: Within Functional Limits for tasks assessed                                        Exercises      General Comments        Pertinent Vitals/Pain Pain Assessment: Faces Faces Pain Scale: Hurts a little bit Pain Descriptors / Indicators: Operative site guarding Pain Intervention(s): Monitored during session;Repositioned    Home Living Family/patient expects to be discharged to:: Private residence Living Arrangements: Spouse/significant other                  Prior Function            PT Goals (current goals can now be found in the care plan section) Acute Rehab PT Goals Patient Stated Goal: return home PT Goal Formulation: With patient Time For Goal Achievement: 02/12/20 Potential to Achieve Goals: Good Progress towards PT goals: Progressing toward goals    Frequency  Min 5X/week      PT Plan Current plan remains appropriate    Co-evaluation              AM-PAC PT "6 Clicks" Mobility   Outcome Measure  Help needed turning from your back to your side while in a flat bed without using bedrails?: None Help needed moving from lying on your back to sitting on the side of a flat bed without using bedrails?: None Help needed moving to and from a bed to a chair (including a wheelchair)?: None Help needed  standing up from a chair using your arms (e.g., wheelchair or bedside chair)?: None Help needed to walk in hospital room?: None Help needed climbing 3-5 steps with a railing? : A Little 6 Click Score: 23    End of Session Equipment Utilized During Treatment: Gait belt;Back brace Activity Tolerance: Patient tolerated treatment well Patient left: in chair;with call bell/phone within reach;with family/visitor present Nurse Communication: Mobility status PT Visit Diagnosis: Unsteadiness on feet (R26.81);Muscle weakness (generalized) (M62.81);Difficulty in walking, not elsewhere classified (R26.2)     Time: 8295-6213 PT Time Calculation (min) (ACUTE ONLY): 16 min  Charges:  $Gait Training: 8-22 mins                     Rolinda Roan, PT, DPT Acute Rehabilitation Services Pager: [phone] Office: [phone]    Thelma Comp 01/30/2020, 11:19 AM

## 2020-01-30 NOTE — Progress Notes (Signed)
Patient is discharged from room 3C05 at this time. Alert and in stable condition. IV site d/c'd and instructions read to patient and spouse with understanding verbalized. Left unit via wheelchair with all belongings at side.

## 2020-02-03 ENCOUNTER — Telehealth: Payer: Self-pay

## 2020-02-03 NOTE — Telephone Encounter (Signed)
[id number]  Attempted to contact patient to complete [facility] telephone call. No answer. Left message requesting call back. Will try again later. 1st attempt

## 2020-02-03 NOTE — Telephone Encounter (Signed)
[id number]  Attempted to contact patient to complete [facility] telephone call. No answer. Left message requesting call back. Will try again later. 2nd attempt

## 2020-02-05 ENCOUNTER — Encounter: Payer: Self-pay | Admitting: Family Medicine

## 2020-02-17 MED FILL — TIROSINT 75 MCG CAPS: 75 | 90 days supply | Qty: 90 | Fill #1

## 2020-02-18 DIAGNOSIS — M48062 Spinal stenosis, lumbar region with neurogenic claudication: Secondary | ICD-10-CM | POA: Diagnosis not present

## 2020-02-18 DIAGNOSIS — M4316 Spondylolisthesis, lumbar region: Secondary | ICD-10-CM | POA: Diagnosis not present

## 2020-02-18 DIAGNOSIS — M5416 Radiculopathy, lumbar region: Secondary | ICD-10-CM | POA: Diagnosis not present

## 2020-04-01 DIAGNOSIS — M5416 Radiculopathy, lumbar region: Secondary | ICD-10-CM | POA: Diagnosis not present

## 2020-04-01 DIAGNOSIS — M48062 Spinal stenosis, lumbar region with neurogenic claudication: Secondary | ICD-10-CM | POA: Diagnosis not present

## 2020-04-01 DIAGNOSIS — M4316 Spondylolisthesis, lumbar region: Secondary | ICD-10-CM | POA: Diagnosis not present

## 2020-04-01 DIAGNOSIS — Z6828 Body mass index (BMI) 28.0-28.9, adult: Secondary | ICD-10-CM | POA: Insufficient documentation

## 2020-04-01 DIAGNOSIS — R030 Elevated blood-pressure reading, without diagnosis of hypertension: Secondary | ICD-10-CM | POA: Diagnosis not present

## 2020-04-08 ENCOUNTER — Other Ambulatory Visit: Payer: Self-pay | Admitting: Nurse Practitioner

## 2020-04-08 DIAGNOSIS — R7303 Prediabetes: Secondary | ICD-10-CM

## 2020-04-13 ENCOUNTER — Other Ambulatory Visit: Payer: Self-pay

## 2020-04-13 ENCOUNTER — Ambulatory Visit (INDEPENDENT_AMBULATORY_CARE_PROVIDER_SITE_OTHER): Payer: Medicare Other | Admitting: Family Medicine

## 2020-04-13 ENCOUNTER — Encounter: Payer: Self-pay | Admitting: Family Medicine

## 2020-04-13 VITALS — BP 146/82 | HR 87 | Resp 16 | Ht 65.0 in | Wt 170.0 lb

## 2020-04-13 DIAGNOSIS — Z1231 Encounter for screening mammogram for malignant neoplasm of breast: Secondary | ICD-10-CM | POA: Diagnosis not present

## 2020-04-13 DIAGNOSIS — E038 Other specified hypothyroidism: Secondary | ICD-10-CM

## 2020-04-13 DIAGNOSIS — R2241 Localized swelling, mass and lump, right lower limb: Secondary | ICD-10-CM | POA: Insufficient documentation

## 2020-04-13 DIAGNOSIS — E663 Overweight: Secondary | ICD-10-CM | POA: Diagnosis not present

## 2020-04-13 DIAGNOSIS — Z23 Encounter for immunization: Secondary | ICD-10-CM | POA: Diagnosis not present

## 2020-04-13 DIAGNOSIS — R030 Elevated blood-pressure reading, without diagnosis of hypertension: Secondary | ICD-10-CM

## 2020-04-13 DIAGNOSIS — L84 Corns and callosities: Secondary | ICD-10-CM

## 2020-04-13 DIAGNOSIS — E559 Vitamin D deficiency, unspecified: Secondary | ICD-10-CM

## 2020-04-13 DIAGNOSIS — E782 Mixed hyperlipidemia: Secondary | ICD-10-CM

## 2020-04-13 DIAGNOSIS — R7303 Prediabetes: Secondary | ICD-10-CM

## 2020-04-13 LAB — T4, FREE: Free T4: 1.5 ng/dL (ref 0.8–1.8)

## 2020-04-13 LAB — TSH: TSH: 3.9 mIU/L (ref 0.40–4.50)

## 2020-04-13 NOTE — Progress Notes (Signed)
Julie May     MRN: [id number]      DOB: 07/09/1951   HPI Ms. Ketter is here for follow up and re-evaluation of chronic medical conditions, medication management and review of any available recent lab and radiology data.  Preventive health is updated, specifically  Cancer screening and Immunization.   Good result from lumbar spine surgery, only able to walk as exercise currently. C/o painful right foot with callus/ corn, specific request to Ortho Wants direction re drinking  Milk C/o swelling of right upper thigh x 2 to 3 months , concerned, no pain The PT denies any adverse reactions to current medications since the last visit.  ts   ROS Denies recent fever or chills. Denies sinus pressure, nasal congestion, ear pain or sore throat. Denies chest congestion, productive cough or wheezing. Denies chest pains, palpitations and leg swelling Denies abdominal pain, nausea, vomiting,diarrhea or constipation.   Denies dysuria, frequency, hesitancy or incontinence. . Denies headaches, seizures, numbness, or tingling. Denies depression, anxiety or insomnia. Denies skin break down or rash.   PE  BP (!) 146/82   Pulse 87   Resp 16   Ht 5\' 5"  (1.651 m)   Wt 170 lb (77.1 kg)   SpO2 97%   BMI 28.29 kg/m   Patient alert and oriented and in no cardiopulmonary distress.  HEENT: No facial asymmetry, EOMI,     Neck supple .  Chest: Clear to auscultation bilaterally.  CVS: S1, S2 no murmurs, no S3.Regular rate.  ABD: Soft non tender.   Ext: No edema. Possible lipoma o right upper thigh, or hemagioma, needs imaging  MS: Adequate though reduced ROM lumbar  spine, adequate in shoulders, hips and knees.  Skin: Intact, no ulcerations or rash noted.  Psych: Good eye contact, normal affect. Memory intact not anxious or depressed appearing.  CNS: CN 2-12 intact, power,  normal throughout.no focal deficits noted.   Assessment & Plan  Callus of foot Right foot callus has been using  orthotics ,for years  daily pain of 5 unable to walk barefoot. Referral to [employer] of right thigh Approximate 3 month history noted x 6 weeks to be discrepancy in size of upper thighs, no pain, no trauma  Hypothyroidism Managed By Endo  Mixed hyperlipidemia Hyperlipidemia:Low fat diet discussed and encouraged.   Lipid Panel  Lab Results  Component Value Date   CHOL 202 (H) 04/03/2019   HDL 68 04/03/2019   LDLCALC 124 (H) 04/03/2019   TRIG 52 04/03/2019   CHOLHDL 3.0 04/03/2019     Updated lab needed at/ before next visit.   Vitamin D deficiency Updated lab needed at/ before next visit.   Prediabetes Patient educated about the importance of limiting  Carbohydrate intake , the need to commit to daily physical activity for a minimum of 30 minutes , and to commit weight loss. The fact that changes in all these areas will reduce or eliminate all together the development of diabetes is stressed.   Diabetic Labs Latest Ref Rng & Units 01/30/2020 01/29/2020 01/24/2020 04/03/2019 08/10/2018  HbA1c 4.8 - 5.6 % - - - 5.8(H) 5.8(H)  Chol 0 - 200 mg/dL - - - 202(H) -  HDL >40 mg/dL - - - 68 -  Calc LDL 0 - 99 mg/dL - - - 124(H) -  Triglycerides <150 mg/dL - - - 52 -  Creatinine 0.44 - 1.00 mg/dL 0.78 0.84 0.73 0.81 -   BP/Weight 04/13/2020 01/30/2020 01/28/2020 01/24/2020  12/16/2019 10/02/2019 95/09/9670  Systolic BP 897 915 - 041 364 383 779  Diastolic BP 82 68 - 88 88 88 78  Wt. (Lbs) 170 - 165 165.1 172 173 174.04  BMI 28.29 - 27.46 27.47 28.62 28.79 28.96   No flowsheet data found. Updated lab needed at/ before next visit.    Elevated blood pressure reading DASH diet and commitment to daily physical activity for a minimum of 30 minutes discussed and encouraged, as a part of hypertension management. The importance of attaining a healthy weight is also discussed.  BP/Weight 04/13/2020 01/30/2020 01/28/2020 01/24/2020 12/16/2019 10/02/2019 39/12/8862  Systolic BP 847 207 - 218  288 337 445  Diastolic BP 82 68 - 88 88 88 78  Wt. (Lbs) 170 - 165 165.1 172 173 174.04  BMI 28.29 - 27.46 27.47 28.62 28.79 28.96   Home bP monitoring, weight loss and  Regular exercise

## 2020-04-13 NOTE — Patient Instructions (Addendum)
F/U in office with MD in 4.5 months, re evaluate blood pressure, and weight,call if you need me sooner  Flu vaccine today  Please schedule pneumonia 23 vaccine the day she is scheduled for [place] of right thigh  Please schedule November mammogram ( morning preferred)  You are referred to Ortho of your choice\  Check BP once weekly and record. 140/90 or more is high Less than 120/80 is normal   Fasting lipid, Vit D, CBC, HBA1C, cmp and EGFr in next 1 to 2 weeks  Think about what you will eat, plan ahead. Choose " clean, green, fresh or frozen" over canned, processed or packaged foods which are more sugary, salty and fatty. 70 to 75% of food eaten should be vegetables and fruit. Three meals at set times with snacks allowed between meals, but they must be fruit or vegetables. Aim to eat over a 12 hour period , example 7 am to 7 pm, and STOP after  your last meal of the day. Drink water,generally about 64 ounces per day, no other drink is as healthy. Fruit juice is best enjoyed in a healthy way, by EATING the fruit. It is important that you exercise regularly at least 30 minutes 5 times a week. If you develop chest pain, have severe difficulty breathing, or feel very tired, stop exercising immediately and seek medical attention   Thanks for choosing [facility], we consider it a privelige to serve you.

## 2020-04-13 NOTE — Assessment & Plan Note (Signed)
Patient educated about the importance of limiting  Carbohydrate intake , the need to commit to daily physical activity for a minimum of 30 minutes , and to commit weight loss. The fact that changes in all these areas will reduce or eliminate all together the development of diabetes is stressed.   Diabetic Labs Latest Ref Rng & Units 01/30/2020 01/29/2020 01/24/2020 04/03/2019 08/10/2018  HbA1c 4.8 - 5.6 % - - - 5.8(H) 5.8(H)  Chol 0 - 200 mg/dL - - - 202(H) -  HDL >40 mg/dL - - - 68 -  Calc LDL 0 - 99 mg/dL - - - 124(H) -  Triglycerides <150 mg/dL - - - 52 -  Creatinine 0.44 - 1.00 mg/dL 0.78 0.84 0.73 0.81 -   BP/Weight 04/13/2020 01/30/2020 01/28/2020 01/24/2020 12/16/2019 10/02/2019 01/13/2262  Systolic BP 335 456 - 256 389 373 428  Diastolic BP 82 68 - 88 88 88 78  Wt. (Lbs) 170 - 165 165.1 172 173 174.04  BMI 28.29 - 27.46 27.47 28.62 28.79 28.96   No flowsheet data found. Updated lab needed at/ before next visit.

## 2020-04-13 NOTE — Assessment & Plan Note (Addendum)
Approximate 3 month history noted x 6 weeks to be discrepancy in size of upper thighs, no pain, no trauma

## 2020-04-13 NOTE — Assessment & Plan Note (Addendum)
Right foot callus has been using orthotics ,for years  daily pain of 5 unable to walk barefoot. Referral to [facility]

## 2020-04-13 NOTE — Assessment & Plan Note (Signed)
Updated lab needed at/ before next visit.   

## 2020-04-13 NOTE — Assessment & Plan Note (Signed)
Managed By Endo

## 2020-04-13 NOTE — Assessment & Plan Note (Signed)
Hyperlipidemia:Low fat diet discussed and encouraged.   Lipid Panel  Lab Results  Component Value Date   CHOL 202 (H) 04/03/2019   HDL 68 04/03/2019   LDLCALC 124 (H) 04/03/2019   TRIG 52 04/03/2019   CHOLHDL 3.0 04/03/2019     Updated lab needed at/ before next visit.

## 2020-04-13 NOTE — Assessment & Plan Note (Signed)
DASH diet and commitment to daily physical activity for a minimum of 30 minutes discussed and encouraged, as a part of hypertension management. The importance of attaining a healthy weight is also discussed.  BP/Weight 04/13/2020 01/30/2020 01/28/2020 01/24/2020 12/16/2019 10/02/2019 29/10/7652  Systolic BP 650 354 - 656 812 751 700  Diastolic BP 82 68 - 88 88 88 78  Wt. (Lbs) 170 - 165 165.1 172 173 174.04  BMI 28.29 - 27.46 27.47 28.62 28.79 28.96   Home bP monitoring, weight loss and  Regular exercise

## 2020-04-16 ENCOUNTER — Telehealth (INDEPENDENT_AMBULATORY_CARE_PROVIDER_SITE_OTHER): Payer: Medicare Other | Admitting: Family Medicine

## 2020-04-16 ENCOUNTER — Encounter: Payer: Self-pay | Admitting: Family Medicine

## 2020-04-16 ENCOUNTER — Other Ambulatory Visit: Payer: Self-pay

## 2020-04-16 VITALS — BP 109/71 | Ht 65.0 in | Wt 170.0 lb

## 2020-04-16 DIAGNOSIS — Z0000 Encounter for general adult medical examination without abnormal findings: Secondary | ICD-10-CM

## 2020-04-16 NOTE — Patient Instructions (Signed)
Julie May , Thank you for taking time to come for your Medicare Wellness Visit. I appreciate your ongoing commitment to your health goals. Please review the following plan we discussed and let me know if I can assist you in the future.   Please continue to practice social distancing to keep you, your family, and our community safe.  If you must go out, please wear a Mask and practice good handwashing.  Screening recommendations/referrals: Colonoscopy: up to date Mammogram: up to date Bone Density: up to date Recommended yearly ophthalmology/optometry visit for glaucoma screening and checkup Recommended yearly dental visit for hygiene and checkup  Vaccinations: Influenza vaccine: up to date Pneumococcal vaccine: needs PNA 23 Tdap vaccine: up to date  Shingles vaccine: completed  Conditions/risks identified: Fall  Next appointment: 08/26/2020 as scheduled  Preventive Care 70 Years and Older, Female Preventive care refers to lifestyle choices and visits with your health care provider that can promote health and wellness. What does preventive care include?  A yearly physical exam. This is also called an annual well check.  Dental exams once or twice a year.  Routine eye exams. Ask your health care provider how often you should have your eyes checked.  Personal lifestyle choices, including:  Daily care of your teeth and gums.  Regular physical activity.  Eating a healthy diet.  Avoiding tobacco and drug use.  Limiting alcohol use.  Practicing safe sex.  Taking low-dose aspirin every day.  Taking vitamin and mineral supplements as recommended by your health care provider. What happens during an annual well check? The services and screenings done by your health care provider during your annual well check will depend on your age, overall health, lifestyle risk factors, and family history of disease. Counseling  Your health care provider may ask you questions about  your:  Alcohol use.  Tobacco use.  Drug use.  Emotional well-being.  Home and relationship well-being.  Sexual activity.  Eating habits.  History of falls.  Memory and ability to understand (cognition).  Work and work Statistician.  Reproductive health. Screening  You may have the following tests or measurements:  Height, weight, and BMI.  Blood pressure.  Lipid and cholesterol levels. These may be checked every 5 years, or more frequently if you are over 70 years old.  Skin check.  Lung cancer screening. You may have this screening every year starting at age 35 if you have a 30-pack-year history of smoking and currently smoke or have quit within the past 15 years.  Fecal occult blood test (FOBT) of the stool. You may have this test every year starting at age 70.  Flexible sigmoidoscopy or colonoscopy. You may have a sigmoidoscopy every 5 years or a colonoscopy every 10 years starting at age 70.  Hepatitis C blood test.  Hepatitis B blood test.  Sexually transmitted disease (STD) testing.  Diabetes screening. This is done by checking your blood sugar (glucose) after you have not eaten for a while (fasting). You may have this done every 1-3 years.  Bone density scan. This is done to screen for osteoporosis. You may have this done starting at age 35.  Mammogram. This may be done every 1-2 years. Talk to your health care provider about how often you should have regular mammograms. Talk with your health care provider about your test results, treatment options, and if necessary, the need for more tests. Vaccines  Your health care provider may recommend certain vaccines, such as:  Influenza vaccine. This is  recommended every year.  Tetanus, diphtheria, and acellular pertussis (Tdap, Td) vaccine. You may need a Td booster every 10 years.  Zoster vaccine. You may need this after age 70.  Pneumococcal 13-valent conjugate (PCV13) vaccine. One dose is recommended  after age 70.  Pneumococcal polysaccharide (PPSV23) vaccine. One dose is recommended after age 70. Talk to your health care provider about which screenings and vaccines you need and how often you need them. This information is not intended to replace advice given to you by your health care provider. Make sure you discuss any questions you have with your health care provider. Document Released: 07/24/2015 Document Revised: 03/16/2016 Document Reviewed: 04/28/2015 Elsevier Interactive Patient Education  2017 [place] Prevention in the Home Falls can cause injuries. They can happen to people of all ages. There are many things you can do to make your home safe and to help prevent falls. What can I do on the outside of my home?  Regularly fix the edges of walkways and driveways and fix any cracks.  Remove anything that might make you trip as you walk through a door, such as a raised step or threshold.  Trim any bushes or trees on the path to your home.  Use bright outdoor lighting.  Clear any walking paths of anything that might make someone trip, such as rocks or tools.  Regularly check to see if handrails are loose or broken. Make sure that both sides of any steps have handrails.  Any raised decks and porches should have guardrails on the edges.  Have any leaves, snow, or ice cleared regularly.  Use sand or salt on walking paths during winter.  Clean up any spills in your garage right away. This includes oil or grease spills. What can I do in the bathroom?  Use night lights.  Install grab bars by the toilet and in the tub and shower. Do not use towel bars as grab bars.  Use non-skid mats or decals in the tub or shower.  If you need to sit down in the shower, use a plastic, non-slip stool.  Keep the floor dry. Clean up any water that spills on the floor as soon as it happens.  Remove soap buildup in the tub or shower regularly.  Attach bath mats securely with  double-sided non-slip rug tape.  Do not have throw rugs and other things on the floor that can make you trip. What can I do in the bedroom?  Use night lights.  Make sure that you have a light by your bed that is easy to reach.  Do not use any sheets or blankets that are too big for your bed. They should not hang down onto the floor.  Have a firm chair that has side arms. You can use this for support while you get dressed.  Do not have throw rugs and other things on the floor that can make you trip. What can I do in the kitchen?  Clean up any spills right away.  Avoid walking on wet floors.  Keep items that you use a lot in easy-to-reach places.  If you need to reach something above you, use a strong step stool that has a grab bar.  Keep electrical cords out of the way.  Do not use floor polish or wax that makes floors slippery. If you must use wax, use non-skid floor wax.  Do not have throw rugs and other things on the floor that can make you trip.  What can I do with my stairs?  Do not leave any items on the stairs.  Make sure that there are handrails on both sides of the stairs and use them. Fix handrails that are broken or loose. Make sure that handrails are as long as the stairways.  Check any carpeting to make sure that it is firmly attached to the stairs. Fix any carpet that is loose or worn.  Avoid having throw rugs at the top or bottom of the stairs. If you do have throw rugs, attach them to the floor with carpet tape.  Make sure that you have a light switch at the top of the stairs and the bottom of the stairs. If you do not have them, ask someone to add them for you. What else can I do to help prevent falls?  Wear shoes that:  Do not have high heels.  Have rubber bottoms.  Are comfortable and fit you well.  Are closed at the toe. Do not wear sandals.  If you use a stepladder:  Make sure that it is fully opened. Do not climb a closed stepladder.  Make  sure that both sides of the stepladder are locked into place.  Ask someone to hold it for you, if possible.  Clearly mark and make sure that you can see:  Any grab bars or handrails.  First and last steps.  Where the edge of each step is.  Use tools that help you move around (mobility aids) if they are needed. These include:  Canes.  Walkers.  Scooters.  Crutches.  Turn on the lights when you go into a dark area. Replace any light bulbs as soon as they burn out.  Set up your furniture so you have a clear path. Avoid moving your furniture around.  If any of your floors are uneven, fix them.  If there are any pets around you, be aware of where they are.  Review your medicines with your doctor. Some medicines can make you feel dizzy. This can increase your chance of falling. Ask your doctor what other things that you can do to help prevent falls. This information is not intended to replace advice given to you by your health care provider. Make sure you discuss any questions you have with your health care provider. Document Released: 04/23/2009 Document Revised: 12/03/2015 Document Reviewed: 08/01/2014 Elsevier Interactive Patient Education  2017 [employer].

## 2020-04-16 NOTE — Progress Notes (Signed)
Subjective:   Julie May is a 69 y.o. female who presents for Medicare Annual (Subsequent) preventive examination.  Method of visit: Telephone  Location of Patient: Home Location of Provider: Office Consent was obtain for visit over the telephone. Services rendered by provider: Visit was performed via telephone  I verified that I am speaking with the correct person using two identifiers.  Review of Systems     Cardiac Risk Factors include: none     Objective:    Today's Vitals   04/16/20 1333  BP: 109/71  Weight: 170 lb (77.1 kg)  Height: 5\' 5"  (1.651 m)  PainSc: 0-No pain   Body mass index is 28.29 kg/m.  Advanced Directives 04/16/2020 01/30/2020 01/24/2020 10/08/2019 03/30/2016 09/18/2015  Does Patient Have a Medical Advance Directive? No No No No No No  Would patient like information on creating a medical advance directive? No - Patient declined No - Patient declined No - Patient declined No - Patient declined No - patient declined information No - patient declined information    Current Medications (verified) Outpatient Encounter Medications as of 04/16/2020  Medication Sig  . Cholecalciferol (VITAMIN D) 2000 units CAPS Take 2,000 Units by mouth daily with lunch.   Serita Grammes 75 MCG CAPS Take 1 capsule (75 mcg total) by mouth daily before breakfast.   No facility-administered encounter medications on file as of 04/16/2020.    Allergies (verified) Patient has no known allergies.   History: Past Medical History:  Diagnosis Date  . Bilateral impacted cerumen 04/10/2019  . Callus of foot 11/07/2017  . Foot pain, bilateral 05/23/2013  . Hyperlipidemia   . Hyperpotassemia   . Hypertension    new dx 11/12/2010  . Hypothyroidism   . OA (osteoarthritis)   . Other abnormal glucose   . Overweight(278.02)   . Polycythemia, secondary   . Routine general medical examination at a health care facility   . THYROID NODULE 01/18/2010   Qualifier: Diagnosis of  By: Loanne Drilling  MD, Jacelyn Pi   . Trigger finger (acquired)   . Unspecified disorder of thyroid   . 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 COLONOSCOPY N/A 03/30/2016   Procedure: COLONOSCOPY;  Surgeon: Rogene Houston, MD;  Location: [facility];  Service: Endoscopy;  Laterality: N/A;  1030  . TONSILECTOMY, ADENOIDECTOMY, BILATERAL MYRINGOTOMY AND TUBES    . TONSILLECTOMY    . uterine polyp removal      Family History  Problem Relation Age of Onset  . Heart attack Father   . Stroke Mother   . Hypertension Mother   . Thyroid disease Sister   . Hyperlipidemia Brother   . Heart attack Brother   . Heart disease Brother   . Arthritis Other        family history   . Hypertension Other        family history   . Stroke Other        family history   . Heart disease Other        family history    Social History   Socioeconomic History  . Marital status: Married    Spouse name: Not on file  . Number of children: 0  . Years of education: Not on file  . Highest education level: Not on file  Occupational History  . Occupation: Clinical cytogeneticist   Tobacco Use  . Smoking status: Never Smoker  . Smokeless tobacco: Never Used  Vaping Use  . Vaping Use: Never used  Substance and Sexual Activity  . Alcohol use: Yes    Comment: occ.  . Drug use: No  . Sexual activity: Yes    Birth control/protection: Post-menopausal  Other Topics Concern  . Not on file  Social History Narrative  . Not on file   Social Determinants of Health   Financial Resource Strain: Low Risk   . Difficulty of Paying Living Expenses: Not hard at all  Food Insecurity: No Food Insecurity  . Worried About Charity fundraiser in the Last Year: Never true  . Ran Out of Food in the Last Year: Never true  Transportation Needs: No Transportation Needs  . Lack of Transportation (Medical): No  . Lack of Transportation (Non-Medical): No  Physical Activity: Sufficiently Active  . Days of  Exercise per Week: 7 days  . Minutes of Exercise per Session: 30 min  Stress: No Stress Concern Present  . Feeling of Stress : Not at all  Social Connections: Moderately Integrated  . Frequency of Communication with Friends and Family: More than three times a week  . Frequency of Social Gatherings with Friends and Family: More than three times a week  . Attends Religious Services: More than 4 times per year  . Active Member of Clubs or Organizations: No  . Attends Archivist Meetings: Never  . Marital Status: Married    Tobacco Counseling Counseling given: Not Answered   Clinical Intake:  Pre-visit preparation completed: Yes  Pain : No/denies pain Pain Score: 0-No pain     BMI - recorded: 28.29 Nutritional Status: BMI 25 -29 Overweight Nutritional Risks: None Diabetes: No  How often do you need to have someone help you when you read instructions, pamphlets, or other written materials from your doctor or pharmacy?: 1 - Never What is the last grade level you completed in school?: 12  Diabetic?Prediabetic   Interpreter Needed?: No      Activities of Daily Living In your present state of health, do you have any difficulty performing the following activities: 04/16/2020 01/30/2020  Hearing? N N  Vision? N N  Difficulty concentrating or making decisions? N N  Walking or climbing stairs? N Y  Comment - -  Dressing or bathing? N Y  Doing errands, shopping? N N  Preparing Food and eating ? N -  Using the Toilet? N -  In the past six months, have you accidently leaked urine? N -  Do you have problems with loss of bowel control? N -  Managing your Medications? N -  Managing your Finances? N -  Housekeeping or managing your Housekeeping? N -  Some recent data might be hidden    Patient Care Team: Fayrene Helper, MD as PCP - General  Indicate any recent Medical Services you may have received from other than [facility] providers in the past year (date may be  approximate).     Assessment:   This is a routine wellness examination for [facility].  Hearing/Vision screen No exam data present  Dietary issues and exercise activities discussed: Current Exercise Habits: Home exercise routine, Type of exercise: walking, Time (Minutes): 30, Frequency (Times/Week): 7, Weekly Exercise (Minutes/Week): 210, Intensity: Mild, Exercise limited by: None identified  Goals    . Weight (lb) < 200 lb (90.7 kg)     Pt would like to lose around 20lbs       Depression Screen PHQ 2/9 Scores 04/16/2020 04/16/2020 04/13/2020 04/16/2019 04/10/2019 05/02/2018  11/01/2017  PHQ - 2 Score 0 0 0 0 0 0 0  PHQ- 9 Score - - - - - - -    Fall Risk Fall Risk  04/16/2020 04/13/2020 10/02/2019 04/16/2019 04/10/2019  Falls in the past year? 0 0 1 1 1   Number falls in past yr: 0 - 0 0 0  Injury with Fall? 0 - 0 0 0  Risk for fall due to : No Fall Risks - - - -  Follow up Falls evaluation completed - - - -    Any stairs in or around the home? Yes  If so, are there any without handrails? Yes  Home free of loose throw rugs in walkways, pet beds, electrical cords, etc? Yes  Adequate lighting in your home to reduce risk of falls? Yes   ASSISTIVE DEVICES UTILIZED TO PREVENT FALLS:  Life alert? No  Use of a cane, walker or w/c? No  Grab bars in the bathroom? Yes  Shower chair or bench in shower? Yes  Elevated toilet seat or a handicapped toilet? Yes   TIMED UP AND GO:  Was the test performed? No .  Length of time to ambulate 10 feet: NA sec.     Cognitive Function:     6CIT Screen 04/16/2020 04/16/2019  What Year? 0 points 0 points  What month? 0 points 0 points  What time? 0 points 0 points  Count back from 20 0 points 0 points  Months in reverse 0 points 0 points  Repeat phrase 0 points 0 points  Total Score 0 0    Immunizations Immunization History  Administered Date(s) Administered  . Fluad Quad(high Dose 65+) 04/10/2019, 04/13/2020  . Influenza Split 06/22/2011  .  Influenza Whole 05/18/2006, 04/30/2007, 04/14/2008  . Influenza,inj,Quad PF,6+ Mos 05/31/2013, 05/28/2014, 04/14/2015, 04/27/2016, 05/16/2017, 04/13/2018  . Moderna SARS-COVID-2 Vaccination 08/23/2019, 09/20/2019  . Pneumococcal Conjugate-13 09/14/2018  . Td 08/13/2009  . Zoster 03/23/2011    TDAP status: Up to date Flu Vaccine status: Up to date Pneumococcal vaccine status: Declined,  Education has been provided regarding the importance of this vaccine but patient still declined. Advised may receive this vaccine at local pharmacy or Health Dept. Aware to provide a copy of the vaccination record if obtained from local pharmacy or Health Dept. Verbalized acceptance and understanding.  Covid-19 vaccine status: Completed vaccines  Qualifies for Shingles Vaccine? No   Zostavax completed Yes   Shingrix Completed?: Yes  Screening Tests Health Maintenance  Topic Date Due  . TETANUS/TDAP  08/14/2019  . PNA vac Low Risk Adult (2 of 2 - PPSV23) 09/14/2019  . MAMMOGRAM  05/22/2021  . COLONOSCOPY  03/30/2026  . INFLUENZA VACCINE  Completed  . DEXA SCAN  Completed  . COVID-19 Vaccine  Completed  . Hepatitis C Screening  Completed    Health Maintenance  Health Maintenance Due  Topic Date Due  . TETANUS/TDAP  08/14/2019  . PNA vac Low Risk Adult (2 of 2 - PPSV23) 09/14/2019    Colorectal cancer screening: Completed 2017. Repeat every 10 years Mammogram status: Completed 05-22-20. Repeat every year Bone Density status: Completed 2020. Results reflect: Bone density results: OSTEOPENIA. Repeat every 2 years.  Lung Cancer Screening: (Low Dose CT Chest recommended if Age 51-80 years, 30 pack-year currently smoking OR have quit w/in 15years.) does not qualify.   Lung Cancer Screening Referral:NA  Additional Screening:  Hepatitis C Screening: does not qualify; Completed  Vision Screening: Recommended annual ophthalmology exams for early detection of  glaucoma and other disorders of the  eye. Is the patient up to date with their annual eye exam?  Yes  Who is the provider or what is the name of the office in which the patient attends annual eye exams? [facility] in [place] If pt is not established with a provider, would they like to be referred to a provider to establish care? No .   Dental Screening: Recommended annual dental exams for proper oral hygiene  Community Resource Referral / Chronic Care Management: CRR required this visit?  No   CCM required this visit?  No      Plan:     1. Encounter for Medicare annual wellness exam   I have personally reviewed and noted the following in the patient's chart:   . Medical and social history . Use of alcohol, tobacco or illicit drugs  . Current medications and supplements . Functional ability and status . Nutritional status . Physical activity . Advanced directives . List of other physicians . Hospitalizations, surgeries, and ER visits in previous 12 months . Vitals . Screenings to include cognitive, depression, and falls . Referrals and appointments  In addition, I have reviewed and discussed with patient certain preventive protocols, quality metrics, and best practice recommendations. A written personalized care plan for preventive services as well as general preventive health recommendations were provided to patient.     Perlie Mayo, NP   04/16/2020    I provided 20 minutes of non-face-to-face time during this encounter.

## 2020-04-17 ENCOUNTER — Telehealth (INDEPENDENT_AMBULATORY_CARE_PROVIDER_SITE_OTHER): Payer: Medicare Other | Admitting: Nurse Practitioner

## 2020-04-17 ENCOUNTER — Other Ambulatory Visit: Payer: Self-pay | Admitting: Nurse Practitioner

## 2020-04-17 ENCOUNTER — Encounter: Payer: Self-pay | Admitting: Nurse Practitioner

## 2020-04-17 DIAGNOSIS — R7303 Prediabetes: Secondary | ICD-10-CM

## 2020-04-17 DIAGNOSIS — E038 Other specified hypothyroidism: Secondary | ICD-10-CM

## 2020-04-17 MED ORDER — TIROSINT 75 MCG PO CAPS: 75.0000 ug | ORAL_CAPSULE | Freq: Every day | ORAL | 3 refills | Status: DC

## 2020-04-17 NOTE — Progress Notes (Signed)
04/17/2020        Endocrinology Follow Up Visit  TELEHEALTH VISIT: The patient is being engaged in telehealth visit due to COVID-19.  This type of visit limits physical examination significantly, and thus is not preferable over face-to-face encounters.  I connected with  Julie May on 04/17/20 by a video enabled telemedicine application and verified that I am speaking with the correct person using two identifiers.   I discussed the limitations of evaluation and management by telemedicine. The patient expressed understanding and agreed to proceed.    The participants involved in this visit include: Brita Romp, NP located at [facility] and Julie May  located at their personal residence listed.   Subjective:    Patient ID: Julie May, female    DOB: 1951-05-15, PCP Fayrene Helper, MD   Past Medical History:  Diagnosis Date   Bilateral impacted cerumen 04/10/2019   Callus of foot 11/07/2017   Foot pain, bilateral 05/23/2013   Hyperlipidemia    Hyperpotassemia    Hypertension    new dx 11/12/2010   Hypothyroidism    OA (osteoarthritis)    Other abnormal glucose    Overweight(278.02)    Polycythemia, secondary    Routine general medical examination at a health care facility    THYROID NODULE 01/18/2010   Qualifier: Diagnosis of  By: Loanne Drilling MD, Sean A    Trigger finger (acquired)    Unspecified disorder of thyroid    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   Past Surgical History:  Procedure Laterality Date   COLONOSCOPY N/A 03/30/2016   Procedure: COLONOSCOPY;  Surgeon: Rogene Houston, MD;  [facility];  Service: Endoscopy;  Laterality: N/A;  1030   TONSILECTOMY, ADENOIDECTOMY, BILATERAL MYRINGOTOMY AND TUBES     TONSILLECTOMY     uterine polyp removal      Social History   Socioeconomic History   Marital status: Married    Spouse name: Not on file   Number of children: 0   Years of education: Not on file    Highest education level: Not on file  Occupational History   Occupation: book Therapist, nutritional   Tobacco Use   Smoking status: Never Smoker   Smokeless tobacco: Never Used  Scientific laboratory technician Use: Never used  Substance and Sexual Activity   Alcohol use: Yes    Comment: occ.   Drug use: No   Sexual activity: Yes    Birth control/protection: Post-menopausal  Other Topics Concern   Not on file  Social History Narrative   Not on file   Social Determinants of Health   Financial Resource Strain: Low Risk    Difficulty of Paying Living Expenses: Not hard at all  Food Insecurity: No Food Insecurity   Worried About Charity fundraiser in the Last Year: Never true   [place] in the Last Year: Never true  Transportation Needs: No Transportation Needs   Lack of Transportation (Medical): No   Lack of Transportation (Non-Medical): No  Physical Activity: Sufficiently Active   Days of Exercise per Week: 7 days   Minutes of Exercise per Session: 30 min  Stress: No Stress Concern Present   Feeling of Stress : Not at all  Social Connections: Moderately Integrated   Frequency of Communication with Friends and Family: More than three times a week   Frequency of Social Gatherings with Friends and Family: More than three times a week   Attends Religious Services:  More than 4 times per year   Active Member of Clubs or Organizations: No   Attends Archivist Meetings: Never   Marital Status: Married   Outpatient Encounter Medications as of 04/17/2020  Medication Sig   Cholecalciferol (VITAMIN D) 2000 units CAPS Take 2,000 Units by mouth daily with lunch.    TIROSINT 75 MCG CAPS Take 1 capsule (75 mcg total) by mouth daily before breakfast.   [DISCONTINUED] TIROSINT 75 MCG CAPS Take 1 capsule (75 mcg total) by mouth daily before breakfast.   No facility-administered encounter medications on file as of 04/17/2020.   ALLERGIES: No Known Allergies VACCINATION  STATUS: Immunization History  Administered Date(s) Administered   Fluad Quad(high Dose 65+) 04/10/2019, 04/13/2020   Influenza Split 06/22/2011   Influenza Whole 05/18/2006, 04/30/2007, 04/14/2008   Influenza,inj,Quad PF,6+ Mos 05/31/2013, 05/28/2014, 04/14/2015, 04/27/2016, 05/16/2017, 04/13/2018   Moderna SARS-COVID-2 Vaccination 08/23/2019, 09/20/2019   Pneumococcal Conjugate-13 09/14/2018   Td 08/13/2009   Zoster 03/23/2011    Thyroid Problem Presents for follow-up visit. Patient reports no anxiety, cold intolerance, constipation, depressed mood, diarrhea, fatigue, heat intolerance, leg swelling, palpitations, tremors, weight gain or weight loss. The symptoms have been stable.    Review of systems  Constitutional: + Minimally fluctuating body weight,  current There is no height or weight on file to calculate BMI. , no fatigue, no subjective hyperthermia, no subjective hypothermia Eyes: no blurry vision, no xerophthalmia ENT: no sore throat, no nodules palpated in throat, no dysphagia/odynophagia, no hoarseness Cardiovascular: no chest pain, no shortness of breath, no palpitations, no leg swelling Respiratory: no cough, no shortness of breath Gastrointestinal: no nausea/vomiting/diarrhea Musculoskeletal: no muscle/joint aches Skin: no rashes, no hyperemia Neurological: no tremors, no numbness, no tingling, no dizziness Psychiatric: no depression, no anxiety   Objective:    There were no vitals taken for this visit.  Wt Readings from Last 3 Encounters:  04/16/20 170 lb (77.1 kg)  04/13/20 170 lb (77.1 kg)  01/28/20 165 lb (74.8 kg)    BP Readings from Last 3 Encounters:  04/16/20 109/71  04/13/20 (!) 146/82  01/30/20 123/68    Physical Exam- Telehealth- significantly limited due to nature of visit  Constitutional: There is no height or weight on file to calculate BMI. , not in acute distress, normal state of mind Respiratory: Adequate breathing  efforts   CMP     Component Value Date/Time   NA 132 (L) 01/30/2020 0558   K 3.9 01/30/2020 0558   CL 101 01/30/2020 0558   CO2 25 01/30/2020 0558   GLUCOSE 105 (H) 01/30/2020 0558   BUN 6 (L) 01/30/2020 0558   CREATININE 0.78 01/30/2020 0558   CREATININE 1.04 11/12/2010 0904   CALCIUM 8.4 (L) 01/30/2020 0558   PROT 7.3 04/03/2019 0934   ALBUMIN 4.3 04/03/2019 0934   AST 21 04/03/2019 0934   ALT 16 04/03/2019 0934   ALKPHOS 82 04/03/2019 0934   BILITOT 0.8 04/03/2019 0934   GFRNONAA >60 01/30/2020 0558   GFRAA >60 01/30/2020 0558     Diabetic Labs (most recent): Lab Results  Component Value Date   HGBA1C 5.8 (H) 04/03/2019   HGBA1C 5.8 (H) 08/10/2018   HGBA1C 5.6 10/11/2016      Lipid Panel     Component Value Date/Time   CHOL 202 (H) 04/03/2019 0934   TRIG 52 04/03/2019 0934   HDL 68 04/03/2019 0934   CHOLHDL 3.0 04/03/2019 0934   VLDL 10 04/03/2019 0934   LDLCALC 124 (H) 04/03/2019  0934   Recent Results (from the past 2160 hour(s))  Surgical pcr screen     Status: None   Collection Time: 01/24/20  8:38 AM   Specimen: Nasal Mucosa; Nasal Swab  Result Value Ref Range   MRSA, PCR NEGATIVE NEGATIVE   Staphylococcus aureus NEGATIVE NEGATIVE    Comment: (NOTE) The Xpert SA Assay (FDA approved for NASAL specimens in patients 10 years of age and older), is one component of a comprehensive surveillance program. It is not intended to diagnose infection nor to guide or monitor treatment. Performed at [facility], [place] [address], [place], [place] [address]   Basic metabolic panel per protocol     Status: Abnormal   Collection Time: 01/24/20  8:39 AM  Result Value Ref Range   Sodium 132 (L) 135 - 145 mmol/L   Potassium 4.1 3.5 - 5.1 mmol/L   Chloride 99 98 - 111 mmol/L   CO2 24 22 - 32 mmol/L   Glucose, Bld 106 (H) 70 - 99 mg/dL    Comment: Glucose reference range applies only to samples taken after fasting for at least 8 hours.   BUN 9 8 - 23 mg/dL    Creatinine, Ser 0.73 0.44 - 1.00 mg/dL   Calcium 9.2 8.9 - 10.3 mg/dL   GFR calc non Af Amer >60 >60 mL/min   GFR calc Af Amer >60 >60 mL/min   Anion gap 9 5 - 15    Comment: Performed at [facility] [address], [place], [place] [address]  CBC per protocol     Status: Abnormal   Collection Time: 01/24/20  8:39 AM  Result Value Ref Range   WBC 5.1 4.0 - 10.5 K/uL   RBC 5.40 (H) 3.87 - 5.11 MIL/uL   Hemoglobin 15.7 (H) 12.0 - 15.0 g/dL   HCT 47.4 (H) 36 - 46 %   MCV 87.8 80.0 - 100.0 fL   MCH 29.1 26.0 - 34.0 pg   MCHC 33.1 30.0 - 36.0 g/dL   RDW 13.2 11.5 - 15.5 %   Platelets 275 150 - 400 K/uL   nRBC 0.0 0.0 - 0.2 %    Comment: Performed at [facility], [place] [address], [place], [place] [address]  Type and screen     Status: None   Collection Time: 01/24/20  8:46 AM  Result Value Ref Range   ABO/RH(D) B POS    Antibody Screen NEG    Sample Expiration      02/07/2020,2359 Performed at [facility], [place] [address], [place], [place] [address]   SARS CORONAVIRUS 2 (TAT 6-24 HRS) Nasopharyngeal Nasopharyngeal Swab     Status: None   Collection Time: 01/24/20  9:10 AM   Specimen: Nasopharyngeal Swab  Result Value Ref Range   SARS Coronavirus 2 NEGATIVE NEGATIVE    Comment: (NOTE) SARS-CoV-2 target nucleic acids are NOT DETECTED.  The SARS-CoV-2 RNA is generally detectable in upper and lower respiratory specimens during the acute phase of infection. Negative results do not preclude SARS-CoV-2 infection, do not rule out co-infections with other pathogens, and should not be used as the sole basis for treatment or other patient management decisions. Negative results must be combined with clinical observations, patient history, and epidemiological information. The expected result is Negative.  Fact Sheet for Patients: SugarRoll.be  Fact Sheet for Healthcare Providers: https://www.woods-mathews.com/  This test is  not yet approved or cleared by the [place] FDA and  has  been authorized for detection and/or diagnosis of SARS-CoV-2 by FDA under an Emergency Use Authorization (EUA). This EUA will remain  in effect (meaning this test can be used) for the duration of the COVID-19 declaration under Se ction 564(b)(1) of the Act, 21 U.S.C. section 360bbb-3(b)(1), unless the authorization is terminated or revoked sooner.  Performed at [facility], [place] [address], [place], [place] [address]   ABO/Rh     Status: None   Collection Time: 01/28/20  7:35 AM  Result Value Ref Range   ABO/RH(D)      B POS Performed at [facility] [address], [place], [place] [address]   Basic metabolic panel     Status: Abnormal   Collection Time: 01/29/20  6:22 PM  Result Value Ref Range   Sodium 123 (L) 135 - 145 mmol/L   Potassium 3.6 3.5 - 5.1 mmol/L   Chloride 90 (L) 98 - 111 mmol/L   CO2 24 22 - 32 mmol/L   Glucose, Bld 125 (H) 70 - 99 mg/dL    Comment: Glucose reference range applies only to samples taken after fasting for at least 8 hours.   BUN 12 8 - 23 mg/dL   Creatinine, Ser 0.84 0.44 - 1.00 mg/dL   Calcium 8.5 (L) 8.9 - 10.3 mg/dL   GFR calc non Af Amer >60 >60 mL/min   GFR calc Af Amer >60 >60 mL/min   Anion gap 9 5 - 15    Comment: Performed at [facility] [address], [place], [place] [address]  CBC with Differential/Platelet     Status: Abnormal   Collection Time: 01/29/20  6:22 PM  Result Value Ref Range   WBC 10.3 4.0 - 10.5 K/uL   RBC 4.00 3.87 - 5.11 MIL/uL   Hemoglobin 11.3 (L) 12.0 - 15.0 g/dL   HCT 33.8 (L) 36 - 46 %   MCV 84.5 80.0 - 100.0 fL   MCH 28.3 26.0 - 34.0 pg   MCHC 33.4 30.0 - 36.0 g/dL   RDW 13.2 11.5 - 15.5 %   Platelets 210 150 - 400 K/uL   nRBC 0.0 0.0 - 0.2 %   Neutrophils Relative % 71 %   Neutro Abs 7.2 1.7 - 7.7 K/uL   Lymphocytes Relative 19 %   Lymphs Abs 1.9 0.7 - 4.0 K/uL   Monocytes Relative 10 %   Monocytes Absolute 1.1 (H)  0.1 - 1.0 K/uL   Eosinophils Relative 0 %   Eosinophils Absolute 0.0 0 - 0 K/uL   Basophils Relative 0 %   Basophils Absolute 0.0 0 - 0 K/uL   Immature Granulocytes 0 %   Abs Immature Granulocytes 0.04 0.00 - 0.07 K/uL    Comment: Performed at [facility], [place] [address], [place], [place] [address]  Basic metabolic panel     Status: Abnormal   Collection Time: 01/30/20  5:58 AM  Result Value Ref Range   Sodium 132 (L) 135 - 145 mmol/L   Potassium 3.9 3.5 - 5.1 mmol/L   Chloride 101 98 - 111 mmol/L   CO2 25 22 - 32 mmol/L   Glucose, Bld 105 (H) 70 - 99 mg/dL    Comment: Glucose reference range applies only to samples taken after fasting for at least 8 hours.   BUN 6 (L) 8 - 23 mg/dL   Creatinine, Ser 0.78 0.44 - 1.00 mg/dL   Calcium 8.4 (L) 8.9 - 10.3 mg/dL  GFR calc non Af Amer >60 >60 mL/min   GFR calc Af Amer >60 >60 mL/min   Anion gap 6 5 - 15    Comment: Performed at [facility] [address], [place], [place] [address]  TSH     Status: None   Collection Time: 04/13/20 11:55 AM  Result Value Ref Range   TSH 3.90 0.40 - 4.50 mIU/L  T4, free     Status: None   Collection Time: 04/13/20 11:55 AM  Result Value Ref Range   Free T4 1.5 0.8 - 1.8 ng/dL     Assessment & Plan:   1.  Primary hypothyroidism -Her previsit labs are consistent with appropriate hormone replacement.  She is advised to continue Tirosint 75 mcg po daily before breakfast.  She did not tolerate levothyroxine or Synthroid in the past.  Will plan on rechecking TFTs prior to next visit in 4 months.   - We discussed about the correct intake of her thyroid hormone, on empty stomach at fasting, with water, separated by at least 30 minutes from breakfast and other medications,  and separated by more than 4 hours from calcium, iron, multivitamins, acid reflux medications (PPIs). -Patient is made aware of the fact that thyroid hormone replacement is needed for life, dose to be adjusted by periodic  monitoring of thyroid function tests.   3. Prediabetes -Her last A1c was 5.8% last year (not checked prior to this appt), still consistent with prediabetes.  She is not on any glucose lowering medications at this time.  Her PCP has ordered A1C to be drawn with her upcoming labs and she will send [place] a copy for review.  - The patient admits there is a room for improvement in their diet and drink choices. -  Suggestion is made for the patient to avoid simple carbohydrates from their diet including Cakes, Sweet Desserts / Pastries, Ice Cream, Soda (diet and regular), Sweet Tea, Candies, Chips, Cookies, Sweet Pastries,  Store Bought Juices, Alcohol in Excess of  1-2 drinks a day, Artificial Sweeteners, Coffee Creamer, and "Sugar-free" Products. This will help patient to have stable blood glucose profile and potentially avoid unintended weight gain.   - I encouraged the patient to switch to unprocessed or minimally processed complex starch and increased protein intake (animal or plant source), fruits, and vegetables.   - Patient is advised to stick to a routine mealtimes to eat 3 meals a day and avoid unnecessary snacks ( to snack only to correct hypoglycemia).  2. Nontoxic multinodular goiter Her last ultrasound from 2017 was unchanged from previous studies, showing no significant findings.  No follow up thyroid US needed at this time.   - I advised patient to maintain close follow up with Fayrene Helper, MD for primary care needs.    I spent 20 minutes dedicated to the care of this patient on the date of this encounter to include pre-visit review of records, face-to-face time with the patient, and post visit ordering of  testing. Julie May  participated in the discussions, expressed understanding, and voiced agreement with the above plans.  All questions were answered to her satisfaction. she is encouraged to contact clinic should she have any questions or concerns prior to her return  visit.   Follow up plan: Return in about 4 months (around 08/18/2020) for Thyroid follow up, Previsit labs, Virtual visit ok.  Rayetta Pigg, [facility] [address] [place], [place] [address] Phone: [phone] Fax:  [phone]  04/17/2020, 9:41 AM

## 2020-04-17 NOTE — Patient Instructions (Signed)

## 2020-04-22 ENCOUNTER — Ambulatory Visit (HOSPITAL_COMMUNITY)
Admission: RE | Admit: 2020-04-22 | Discharge: 2020-04-22 | Disposition: A | Discharge status: Home / Self Care | Payer: Medicare Other | Source: Ambulatory Visit | Attending: Family Medicine | Admitting: Family Medicine

## 2020-04-22 ENCOUNTER — Other Ambulatory Visit: Payer: Self-pay

## 2020-04-22 DIAGNOSIS — R2241 Localized swelling, mass and lump, right lower limb: Secondary | ICD-10-CM | POA: Diagnosis not present

## 2020-04-24 DIAGNOSIS — M79671 Pain in right foot: Secondary | ICD-10-CM | POA: Diagnosis not present

## 2020-04-29 ENCOUNTER — Encounter: Payer: Self-pay | Admitting: Family Medicine

## 2020-05-11 ENCOUNTER — Other Ambulatory Visit: Payer: Self-pay

## 2020-05-11 DIAGNOSIS — R2241 Localized swelling, mass and lump, right lower limb: Secondary | ICD-10-CM

## 2020-05-11 DIAGNOSIS — R7303 Prediabetes: Secondary | ICD-10-CM | POA: Diagnosis not present

## 2020-05-11 DIAGNOSIS — E782 Mixed hyperlipidemia: Secondary | ICD-10-CM | POA: Diagnosis not present

## 2020-05-11 DIAGNOSIS — E559 Vitamin D deficiency, unspecified: Secondary | ICD-10-CM | POA: Diagnosis not present

## 2020-05-11 NOTE — Telephone Encounter (Signed)
Referral entered  

## 2020-05-12 LAB — COMPLETE METABOLIC PANEL WITH GFR
AG Ratio: 1.5 (calc) (ref 1.0–2.5)
ALT: 16 U/L (ref 6–29)
AST: 22 U/L (ref 10–35)
Albumin: 4.4 g/dL (ref 3.6–5.1)
Alkaline phosphatase (APISO): 99 U/L (ref 37–153)
BUN: 9 mg/dL (ref 7–25)
CO2: 28 mmol/L (ref 20–32)
Calcium: 9.9 mg/dL (ref 8.6–10.4)
Chloride: 94 mmol/L — ABNORMAL LOW (ref 98–110)
Creat: 0.8 mg/dL (ref 0.60–0.93)
GFR, Est African American: 87 mL/min/{1.73_m2} (ref >=60)
GFR, Est Non African American: 75 mL/min/{1.73_m2} (ref >=60)
Globulin: 2.9 g/dL (calc) (ref 1.9–3.7)
Glucose, Bld: 102 mg/dL — ABNORMAL HIGH (ref 65–99)
Potassium: 4.5 mmol/L (ref 3.5–5.3)
Sodium: 132 mmol/L — ABNORMAL LOW (ref 135–146)
Total Bilirubin: 0.5 mg/dL (ref 0.2–1.2)
Total Protein: 7.3 g/dL (ref 6.1–8.1)

## 2020-05-12 LAB — CBC WITH DIFFERENTIAL/PLATELET
Absolute Monocytes: 385 cells/uL (ref 200–950)
Basophils Absolute: 28 cells/uL (ref 0–200)
Basophils Relative: 0.6 %
Eosinophils Absolute: 71 cells/uL (ref 15–500)
Eosinophils Relative: 1.5 %
HCT: 45.2 % — ABNORMAL HIGH (ref 35.0–45.0)
Hemoglobin: 14.9 g/dL (ref 11.7–15.5)
Lymphs Abs: 1626 cells/uL (ref 850–3900)
MCH: 28.3 pg (ref 27.0–33.0)
MCHC: 33 g/dL (ref 32.0–36.0)
MCV: 85.9 fL (ref 80.0–100.0)
MPV: 9.9 fL (ref 7.5–12.5)
Monocytes Relative: 8.2 %
Neutro Abs: 2590 cells/uL (ref 1500–7800)
Neutrophils Relative %: 55.1 %
Platelets: 298 10*3/uL (ref 140–400)
RBC: 5.26 10*6/uL — ABNORMAL HIGH (ref 3.80–5.10)
RDW: 12.6 % (ref 11.0–15.0)
Total Lymphocyte: 34.6 %
WBC: 4.7 10*3/uL (ref 3.8–10.8)

## 2020-05-12 LAB — LIPID PANEL
Cholesterol: 206 mg/dL — ABNORMAL HIGH (ref <200)
HDL: 81 mg/dL (ref >=50)
LDL Cholesterol (Calc): 110 mg/dL (calc) — ABNORMAL HIGH
Non-HDL Cholesterol (Calc): 125 mg/dL (calc) (ref <130)
Total CHOL/HDL Ratio: 2.5 (calc) (ref <5.0)
Triglycerides: 66 mg/dL (ref <150)

## 2020-05-12 LAB — HEMOGLOBIN A1C
Hgb A1c MFr Bld: 5.7 % of total Hgb — ABNORMAL HIGH (ref <5.7)
Mean Plasma Glucose: 117 (calc)
eAG (mmol/L): 6.5 (calc)

## 2020-05-12 LAB — VITAMIN D 25 HYDROXY (VIT D DEFICIENCY, FRACTURES): Vit D, 25-Hydroxy: 42 ng/mL (ref 30–100)

## 2020-05-15 ENCOUNTER — Encounter: Payer: Self-pay | Admitting: Family Medicine

## 2020-05-18 NOTE — Telephone Encounter (Signed)
Looks like ours were not drawn as [facility] lab ordered were the same as ours. Do you have a patient recommendation for those labs?

## 2020-05-23 DIAGNOSIS — Z23 Encounter for immunization: Secondary | ICD-10-CM | POA: Diagnosis not present

## 2020-05-25 ENCOUNTER — Ambulatory Visit (HOSPITAL_COMMUNITY)
Admission: RE | Admit: 2020-05-25 | Discharge: 2020-05-25 | Disposition: A | Discharge status: Home / Self Care | Payer: Medicare Other | Source: Ambulatory Visit | Attending: Family Medicine | Admitting: Family Medicine

## 2020-05-25 ENCOUNTER — Other Ambulatory Visit: Payer: Self-pay

## 2020-05-25 DIAGNOSIS — Z1231 Encounter for screening mammogram for malignant neoplasm of breast: Secondary | ICD-10-CM | POA: Insufficient documentation

## 2020-05-26 ENCOUNTER — Encounter: Payer: Self-pay | Admitting: General Surgery

## 2020-05-26 ENCOUNTER — Ambulatory Visit (INDEPENDENT_AMBULATORY_CARE_PROVIDER_SITE_OTHER): Payer: Medicare Other | Admitting: General Surgery

## 2020-05-26 VITALS — BP 157/94 | HR 74 | Temp 98.1°F | Resp 12 | Ht 65.0 in | Wt 166.0 lb

## 2020-05-26 DIAGNOSIS — Z09 Encounter for follow-up examination after completed treatment for conditions other than malignant neoplasm: Secondary | ICD-10-CM

## 2020-05-27 ENCOUNTER — Encounter: Payer: Self-pay | Admitting: Family Medicine

## 2020-05-28 MED FILL — TIROSINT 75 MCG CAPS: 75 | 90 days supply | Qty: 90 | Fill #0

## 2020-06-09 ENCOUNTER — Encounter: Payer: Self-pay | Admitting: General Surgery

## 2020-06-09 ENCOUNTER — Other Ambulatory Visit: Payer: Self-pay

## 2020-06-09 ENCOUNTER — Ambulatory Visit (INDEPENDENT_AMBULATORY_CARE_PROVIDER_SITE_OTHER): Payer: Medicare Other | Admitting: General Surgery

## 2020-06-09 VITALS — BP 147/90 | HR 79 | Temp 97.7°F | Resp 14 | Ht 65.0 in | Wt 167.0 lb

## 2020-06-09 DIAGNOSIS — D492 Neoplasm of unspecified behavior of bone, soft tissue, and skin: Secondary | ICD-10-CM

## 2020-06-09 NOTE — Patient Instructions (Signed)
Lipoma  A lipoma is a noncancerous (benign) tumor that is made up of fat cells. This is a very common type of soft-tissue growth. Lipomas are usually found under the skin (subcutaneous). They may occur in any tissue of the body that contains fat. Common areas for lipomas to appear include the back, arms, shoulders, buttocks, and thighs. Lipomas grow slowly, and they are usually painless. Most lipomas do not cause problems and do not require treatment. What are the causes? The cause of this condition is not known. What increases the risk? You are more likely to develop this condition if:  You are 40-60 years old.  You have a family history of lipomas. What are the signs or symptoms? A lipoma usually appears as a small, round bump under the skin. In most cases, the lump will:  Feel soft or rubbery.  Not cause pain or other symptoms. However, if a lipoma is located in an area where it pushes on nerves, it can become painful or cause other symptoms. How is this diagnosed? A lipoma can usually be diagnosed with a physical exam. You may also have tests to confirm the diagnosis and to rule out other conditions. Tests may include:  Imaging tests, such as a CT scan or an MRI.  Removal of a tissue sample to be looked at under a microscope (biopsy). How is this treated? Treatment for this condition depends on the size of the lipoma and whether it is causing any symptoms.  For small lipomas that are not causing problems, no treatment is needed.  If a lipoma is bigger or it causes problems, surgery may be done to remove the lipoma. Lipomas can also be removed to improve appearance. Most often, the procedure is done after applying a medicine that numbs the area (local anesthetic).  Liposuction may be done to reduce the size of the lipoma before it is removed through surgery, or it may be done to remove the lipoma. Lipomas are removed with this method in order to limit incision size and scarring. A  liposuction tube is inserted through a small incision into the lipoma, and the contents of the lipoma are removed through the tube with suction. Follow these instructions at home:  Watch your lipoma for any changes.  Keep all follow-up visits as told by your health care provider. This is important. Contact a health care provider if:  Your lipoma becomes larger or hard.  Your lipoma becomes painful, red, or increasingly swollen. These could be signs of infection or a more serious condition. Get help right away if:  You develop tingling or numbness in an area near the lipoma. This could indicate that the lipoma is causing nerve damage. Summary  A lipoma is a noncancerous tumor that is made up of fat cells.  Most lipomas do not cause problems and do not require treatment.  If a lipoma is bigger or it causes problems, surgery may be done to remove the lipoma.  Contact a health care provider if your lipoma becomes larger or hard, or if it becomes painful, red, or increasingly swollen. Pain, redness, and swelling could be signs of infection or a more serious condition. This information is not intended to replace advice given to you by your health care provider. Make sure you discuss any questions you have with your health care provider. Document Revised: 02/11/2019 Document Reviewed: 02/11/2019 Elsevier Patient Education  2020 Elsevier Inc.  

## 2020-06-09 NOTE — Progress Notes (Signed)
Julie May; [id number]; 06/18/1951   HPI Patient is a 69 year old white female who was referred to my care by Dr. Moshe Cipro for evaluation and treatment of a soft tissue mass in the upper right thigh.  She states it has been present for several months but seems to be increasing in size.  It does not cause her discomfort.  No drainage has been noted.  She did have an ultrasound of this area which showed a 5.6cm ovoid mass consistent with a lipoma, though it was recommended to get a follow-up CT scan or MRI of that area.  Patient does not want surgery unless absolutely necessary. Past Medical History:  Diagnosis Date  . Bilateral impacted cerumen 04/10/2019  . Callus of foot 11/07/2017  . Foot pain, bilateral 05/23/2013  . Hyperlipidemia   . Hyperpotassemia   . Hypertension    new dx 11/12/2010  . Hypothyroidism   . OA (osteoarthritis)   . Other abnormal glucose   . Overweight(278.02)   . Polycythemia, secondary   . Routine general medical examination at a health care facility   . THYROID NODULE 01/18/2010   Qualifier: Diagnosis of  By: Loanne Drilling MD, Jacelyn Pi   . Trigger finger (acquired)   . Unspecified disorder of thyroid   . 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 COLONOSCOPY N/A 03/30/2016   Procedure: COLONOSCOPY;  Surgeon: Rogene Houston, MD;  [facility];  Service: Endoscopy;  Laterality: N/A;  1030  . TONSILECTOMY, ADENOIDECTOMY, BILATERAL MYRINGOTOMY AND TUBES    . TONSILLECTOMY    . uterine polyp removal       Family History  Problem Relation Age of Onset  . Heart attack Father   . Stroke Mother   . Hypertension Mother   . Thyroid disease Sister   . Hyperlipidemia Brother   . Heart attack Brother   . Heart disease Brother   . Arthritis Other        family history   . Hypertension Other        family history   . Stroke Other        family history   . Heart disease Other        family history     Current  Outpatient Medications on File Prior to Visit  Medication Sig Dispense Refill  . Cholecalciferol (VITAMIN D) 2000 units CAPS Take 2,000 Units by mouth daily with lunch.     Serita Grammes 75 MCG CAPS Take 1 capsule (75 mcg total) by mouth daily before breakfast. 90 capsule 3   No current facility-administered medications on file prior to visit.    No Known Allergies  Social History   Substance and Sexual Activity  Alcohol Use Yes   Comment: occ.    Social History   Tobacco Use  Smoking Status Never Smoker  Smokeless Tobacco Never Used    Review of Systems  Constitutional: Negative.   HENT: Negative.   Eyes: Negative.   Respiratory: Negative.   Cardiovascular: Negative.   Gastrointestinal: Negative.   Genitourinary: Negative.   Musculoskeletal: Negative.   Skin: Negative.   Neurological: Negative.   Endo/Heme/Allergies: Negative.   Psychiatric/Behavioral: Negative.     Objective   Vitals:   06/09/20 1258  BP: (!) 147/90  Pulse: 79  Resp: 14  Temp: 97.7 F (36.5 C)  SpO2: 94%    Physical Exam Vitals reviewed.  Constitutional:      Appearance: Normal appearance.  She is normal weight. She is not ill-appearing.  HENT:     Head: Normocephalic and atraumatic.  Cardiovascular:     Rate and Rhythm: Normal rate and regular rhythm.     Heart sounds: Normal heart sounds. No murmur heard.  No friction rub. No gallop.   Pulmonary:     Effort: Pulmonary effort is normal. No respiratory distress.     Breath sounds: Normal breath sounds. No stridor. No wheezing, rhonchi or rales.  Musculoskeletal:     Comments: Indistinct deep soft tissue mass in the anterior right upper thigh in close proximity to the muscle.  The margins were somewhat indistinct.  No superficial skin changes noted.  Skin:    General: Skin is warm and dry.  Neurological:     Mental Status: She is alert and oriented to person, place, and time.   Ultrasound report reviewed  Assessment  Soft tissue  mass, right thigh Plan   We will get MRI of right thigh to further delineate the mass.  Further management is pending those results.

## 2020-06-24 DIAGNOSIS — R030 Elevated blood-pressure reading, without diagnosis of hypertension: Secondary | ICD-10-CM | POA: Diagnosis not present

## 2020-06-24 DIAGNOSIS — Z6827 Body mass index (BMI) 27.0-27.9, adult: Secondary | ICD-10-CM | POA: Diagnosis not present

## 2020-06-24 DIAGNOSIS — M48062 Spinal stenosis, lumbar region with neurogenic claudication: Secondary | ICD-10-CM | POA: Diagnosis not present

## 2020-06-24 DIAGNOSIS — M5416 Radiculopathy, lumbar region: Secondary | ICD-10-CM | POA: Diagnosis not present

## 2020-06-24 DIAGNOSIS — M4316 Spondylolisthesis, lumbar region: Secondary | ICD-10-CM | POA: Diagnosis not present

## 2020-06-25 ENCOUNTER — Ambulatory Visit (HOSPITAL_COMMUNITY)
Admission: RE | Admit: 2020-06-25 | Discharge: 2020-06-25 | Disposition: A | Discharge status: Home / Self Care | Payer: Medicare Other | Source: Ambulatory Visit | Attending: General Surgery | Admitting: General Surgery

## 2020-06-25 ENCOUNTER — Other Ambulatory Visit: Payer: Self-pay

## 2020-06-25 DIAGNOSIS — D492 Neoplasm of unspecified behavior of bone, soft tissue, and skin: Secondary | ICD-10-CM | POA: Diagnosis not present

## 2020-06-25 DIAGNOSIS — R2241 Localized swelling, mass and lump, right lower limb: Secondary | ICD-10-CM | POA: Diagnosis not present

## 2020-06-25 DIAGNOSIS — M1611 Unilateral primary osteoarthritis, right hip: Secondary | ICD-10-CM | POA: Diagnosis not present

## 2020-06-25 MED ORDER — GADOBUTROL 1 MMOL/ML IV SOLN
7.0000 mL | Freq: Once | INTRAVENOUS | Status: AC | PRN
  Administered 2020-06-25: 7 mL via INTRAVENOUS

## 2020-06-27 NOTE — Progress Notes (Signed)
appt cancelled by patient.  

## 2020-06-29 ENCOUNTER — Other Ambulatory Visit: Payer: Self-pay

## 2020-06-29 ENCOUNTER — Ambulatory Visit (HOSPITAL_COMMUNITY)
Admission: RE | Admit: 2020-06-29 | Discharge: 2020-06-29 | Disposition: A | Discharge status: Home / Self Care | Payer: Medicare Other | Source: Ambulatory Visit | Attending: Family Medicine | Admitting: Family Medicine

## 2020-06-29 DIAGNOSIS — Z1231 Encounter for screening mammogram for malignant neoplasm of breast: Secondary | ICD-10-CM | POA: Insufficient documentation

## 2020-06-30 ENCOUNTER — Telehealth: Payer: Self-pay | Admitting: Family Medicine

## 2020-06-30 NOTE — Telephone Encounter (Signed)
Called pt to inform of MRI results per Dr. Arnoldo Morale - MRI was normal with no mass identified and therefore no need for further evaluation - Left detailed message on vm.

## 2020-07-01 ENCOUNTER — Telehealth: Payer: Self-pay | Admitting: Family Medicine

## 2020-07-01 NOTE — Telephone Encounter (Signed)
Please let patient know that I recently had communication from the neurosurgeon and her blood pressure was noted to be even more elevated than at her last  Visit with me.    As a result I am recommending that she schedule her follow-up blood pressure appointment for mid January rather than mid February.  Please change appointment date to no later than the middle of January if she agrees thank you  Please also ask that if she does have a blood pressure cuff at home which she uses , please bring it to the visit.

## 2020-07-02 NOTE — Telephone Encounter (Signed)
Pt aware and scheduled for sooner appt

## 2020-07-21 IMAGING — MG DIGITAL SCREENING BILAT W/ TOMO W/ CAD
8 series · 9 of 24 positions shown · non-contrast
Comparison: Previous exam(s).

CLINICAL DATA: Screening.

EXAM:
DIGITAL SCREENING BILATERAL MAMMOGRAM WITH TOMO AND CAD

[L MLO synth-2D]
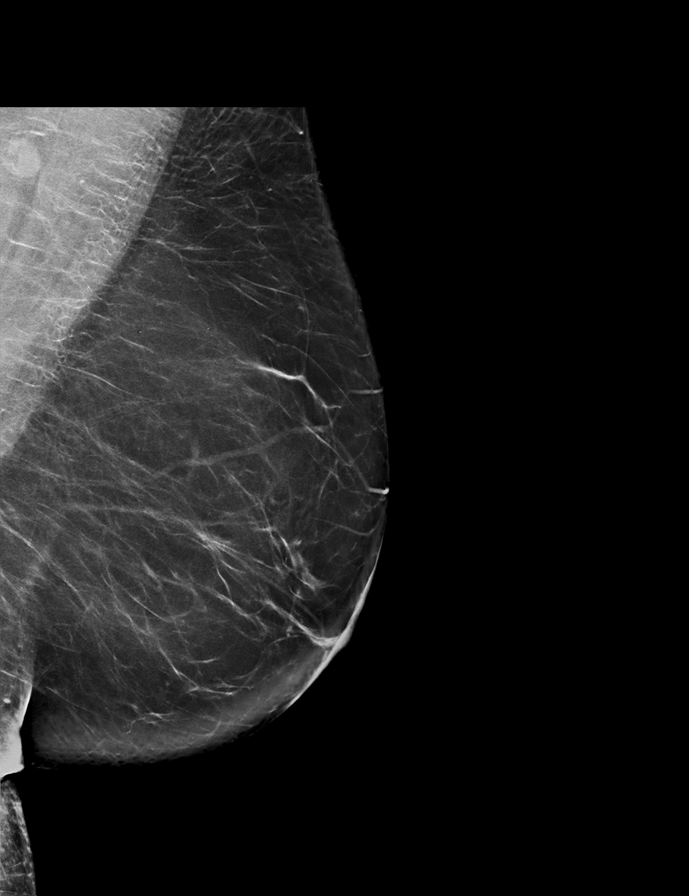

[R CC synth-2D]
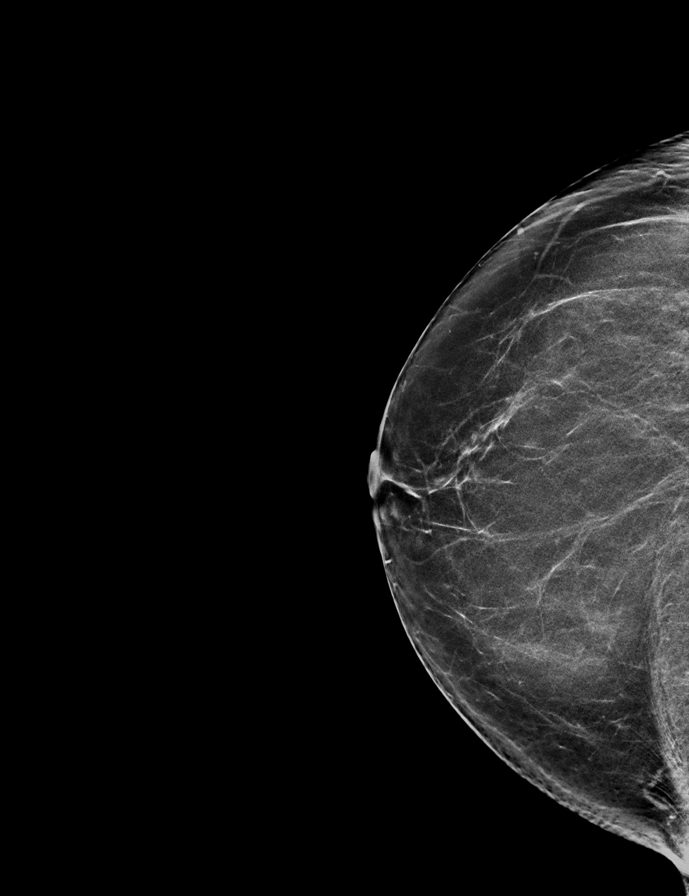

[R MLO synth-2D]
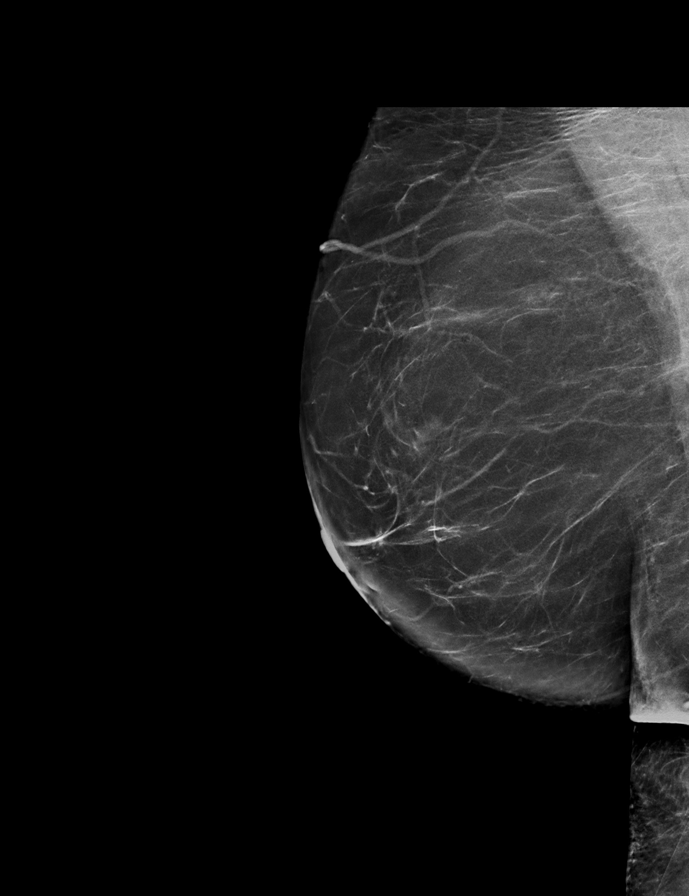

[L CC synth-2D]
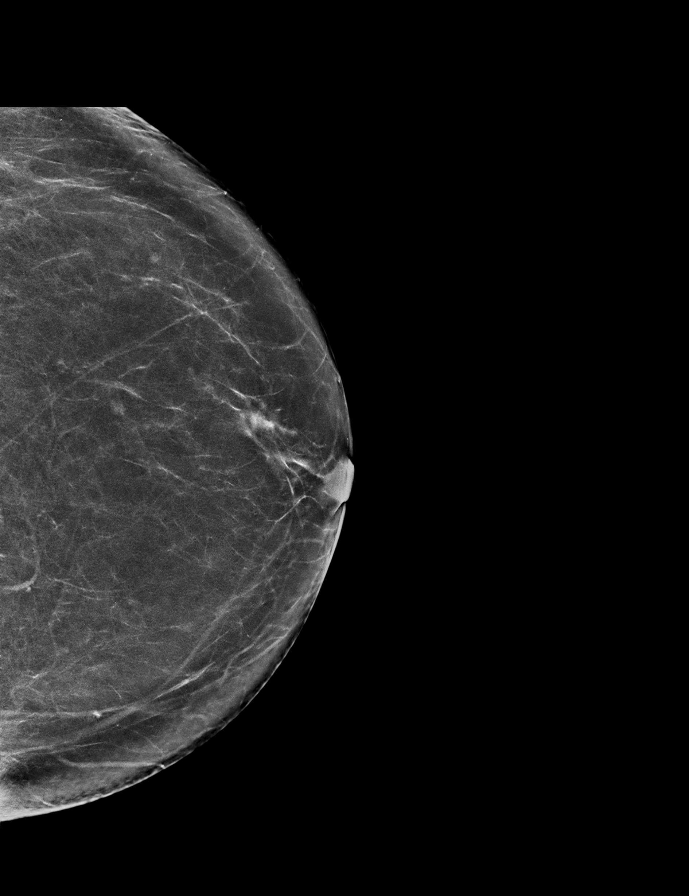

[R MLO tomo · 2 of 73 frames shown]
[frame 24/73]
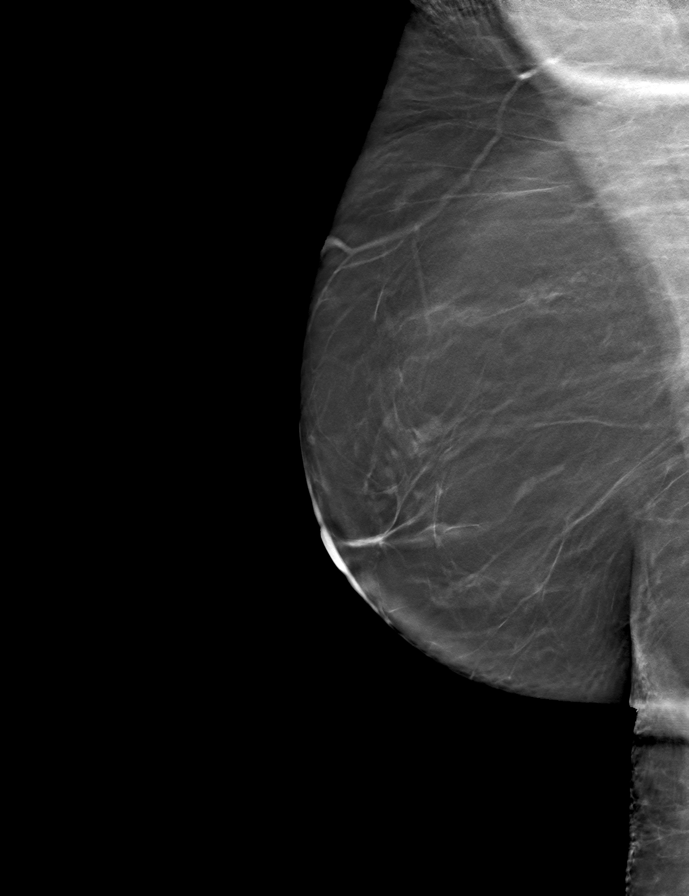
[frame 37/73]
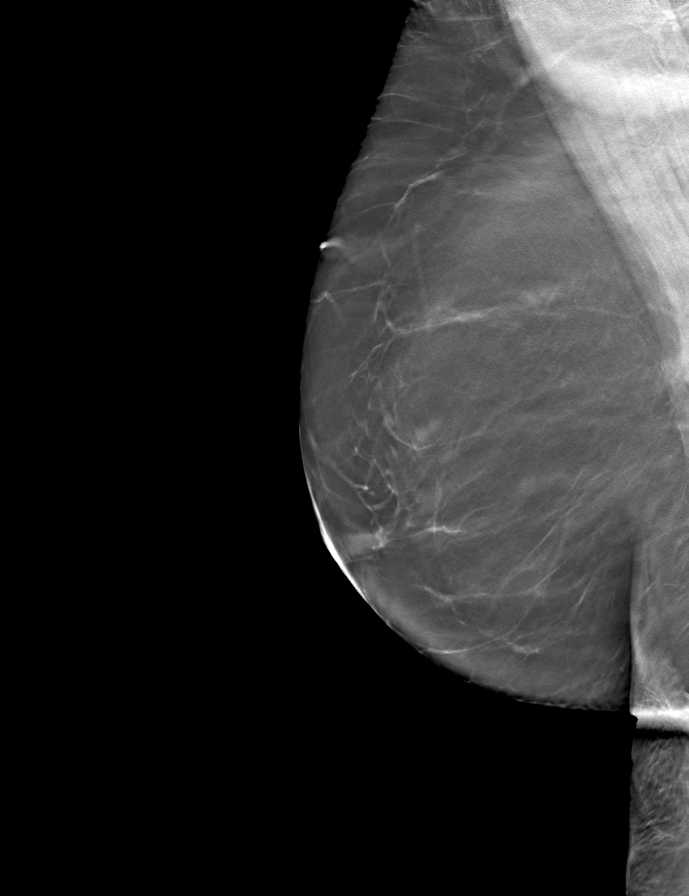

[R CC tomo · tomo slice 20/39.0]
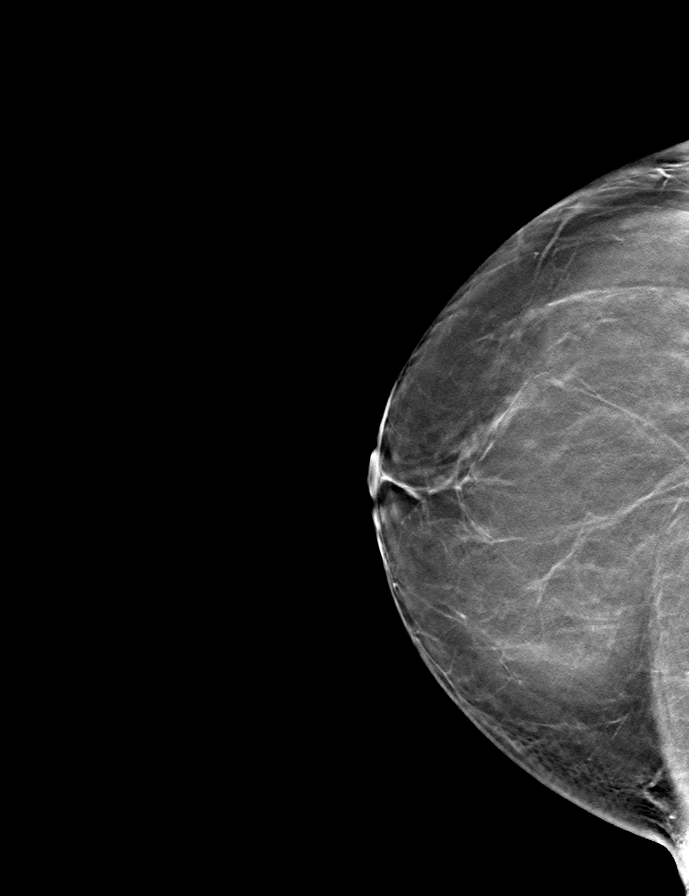

[L CC tomo · tomo slice 35/70.0]
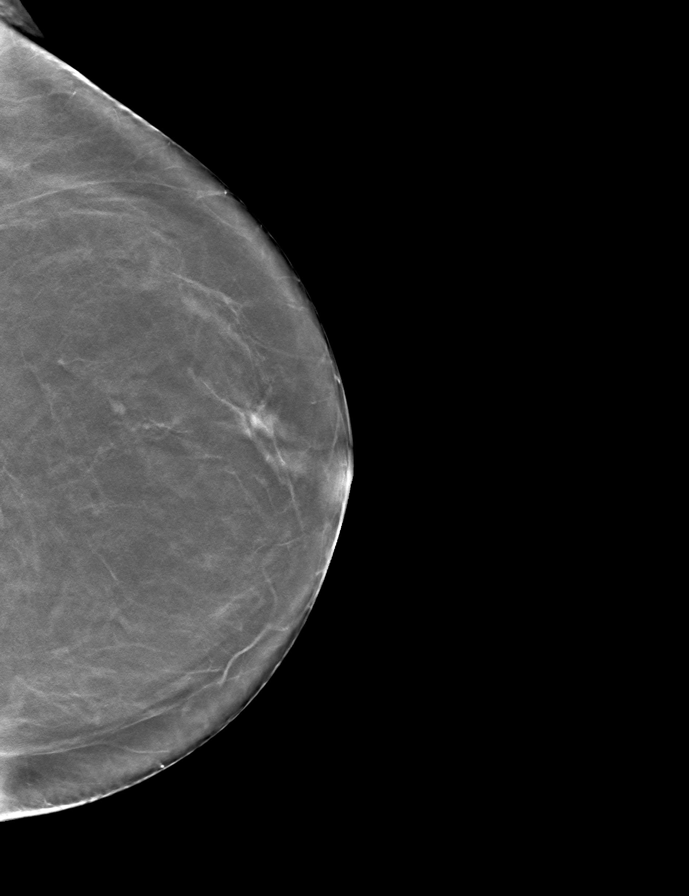

[L MLO tomo · tomo slice 39/78.0]
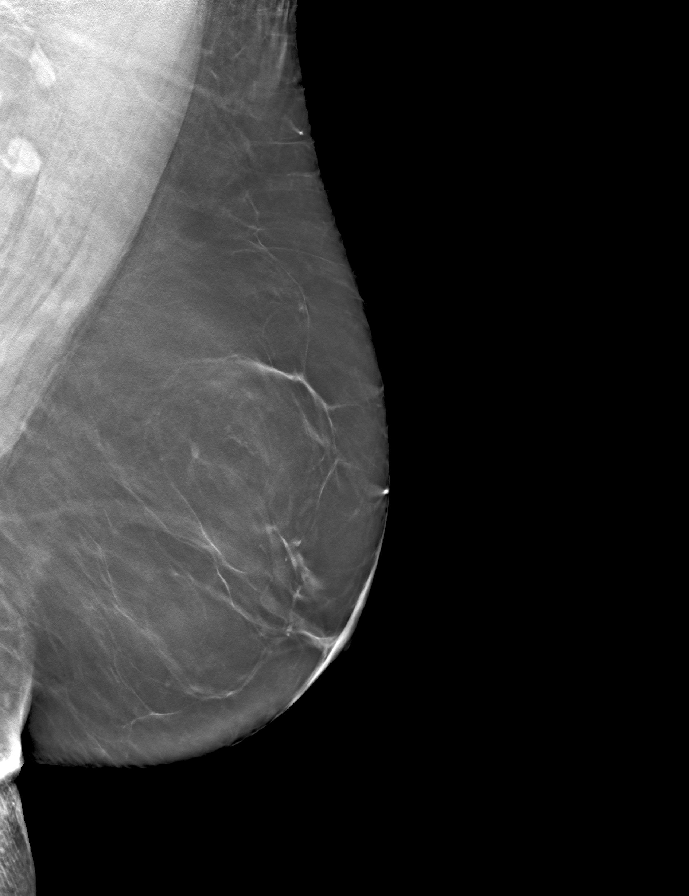

[9 of 24 positions shown; findings below may reference images not displayed]

ACR Breast Density Category b: There are scattered areas of
fibroglandular density.
FINDINGS: There are no findings suspicious for malignancy. Images were
processed with CAD.
IMPRESSION: No mammographic evidence of malignancy. A result letter of this
screening mammogram will be mailed directly to the patient.

RECOMMENDATION:
Screening mammogram in one year. (Code:[id number])

BI-RADS CATEGORY  1: Negative.

## 2020-07-22 ENCOUNTER — Encounter: Payer: Self-pay | Admitting: Family Medicine

## 2020-07-23 ENCOUNTER — Telehealth (INDEPENDENT_AMBULATORY_CARE_PROVIDER_SITE_OTHER): Payer: Medicare Other | Admitting: Family Medicine

## 2020-07-23 ENCOUNTER — Encounter: Payer: Self-pay | Admitting: Family Medicine

## 2020-07-23 ENCOUNTER — Other Ambulatory Visit: Payer: Self-pay

## 2020-07-23 VITALS — BP 124/72 | HR 64 | Ht 65.0 in | Wt 167.0 lb

## 2020-07-23 DIAGNOSIS — E782 Mixed hyperlipidemia: Secondary | ICD-10-CM

## 2020-07-23 DIAGNOSIS — E663 Overweight: Secondary | ICD-10-CM | POA: Diagnosis not present

## 2020-07-23 DIAGNOSIS — R030 Elevated blood-pressure reading, without diagnosis of hypertension: Secondary | ICD-10-CM | POA: Diagnosis not present

## 2020-07-23 DIAGNOSIS — R7303 Prediabetes: Secondary | ICD-10-CM | POA: Diagnosis not present

## 2020-07-23 NOTE — Assessment & Plan Note (Signed)
  Patient re-educated about  the importance of commitment to a  minimum of 150 minutes of exercise per week as able.  The importance of healthy food choices with portion control discussed, as well as eating regularly and within a 12 hour window most days. The need to choose "clean , green" food 50 to 75% of the time is discussed, as well as to make water the primary drink and set a goal of 64 ounces water daily.    Weight /BMI 07/23/2020 06/09/2020 05/26/2020  WEIGHT 167 lb 167 lb 166 lb  HEIGHT 5\' 5"  5\' 5"  5\' 5"   BMI 27.79 kg/m2 27.79 kg/m2 27.62 kg/m2

## 2020-07-23 NOTE — Assessment & Plan Note (Signed)
Patient educated about the importance of limiting  Carbohydrate intake , the need to commit to daily physical activity for a minimum of 30 minutes , and to commit weight loss. The fact that changes in all these areas will reduce or eliminate all together the development of diabetes is stressed.  Improved continue low carb diet  Diabetic Labs Latest Ref Rng & Units 05/11/2020 01/30/2020 01/29/2020 01/24/2020 04/03/2019  HbA1c <5.7 % of total Hgb 5.7(H) - - - 5.8(H)  Chol <200 mg/dL 206(H) - - - 202(H)  HDL > OR = 50 mg/dL 81 - - - 68  Calc LDL mg/dL (calc) 110(H) - - - 124(H)  Triglycerides <150 mg/dL 66 - - - 52  Creatinine 0.60 - 0.93 mg/dL 0.80 0.78 0.84 0.73 0.81   BP/Weight 07/23/2020 06/09/2020 05/26/2020 04/16/2020 04/13/2020 01/30/2020 3/95/3202  Systolic BP 334 356 861 683 729 021 -  Diastolic BP 72 90 94 71 82 68 -  Wt. (Lbs) 167 167 166 170 170 - 165  BMI 27.79 27.79 27.62 28.29 28.29 - 27.46   No flowsheet data found.

## 2020-07-23 NOTE — Assessment & Plan Note (Signed)
Home blood pressure monitoring over past 6 weeks are all norma DASH diet and commitment to daily physical activity for a minimum of 30 minutes discussed and encouraged, as a part of hypertension management. The importance of attaining a healthy weight is also discussed.  BP/Weight 07/23/2020 06/09/2020 05/26/2020 04/16/2020 04/13/2020 01/30/2020 8/75/6433  Systolic BP 295 188 416 606 301 601 -  Diastolic BP 72 90 94 71 82 68 -  Wt. (Lbs) 167 167 166 170 170 - 165  BMI 27.79 27.79 27.62 28.29 28.29 - 27.46     OV in 6 months, call if elevated reading obtained at home to come in to the office

## 2020-07-23 NOTE — Progress Notes (Signed)
Virtual Visit via Telephone Note  I connected with [name] on 07/23/20 at  9:40 AM EST by telephone and verified that I am speaking with the correct person using two identifiers.  Location: Patient: home Provider: office   I discussed the limitations, risks, security and privacy concerns of performing an evaluation and management service by telephone and the availability of in person appointments. I also discussed with the patient that there may be a patient responsible charge related to this service. The patient expressed understanding and agreed to proceed.   History of Present Illness: Visit to assess blood pressure as she has been getting elevated readings intermittently over the past several months . Documented excellent and normal blood pressure readings have been sent in none of which are elevated She continues to exercise for 150 mins weekly and will work on weight loss through food choices and portion control.  Recent labs are also reviewed.  Review of systems is negative    Observations/Objective: BP 124/72   Pulse 64   Ht 5\' 5"  (1.651 m)   Wt 167 lb (75.8 kg)   BMI 27.79 kg/m  Good communication with no confusion and intact memory. Alert and oriented x 3 No signs of respiratory distress during speech    Assessment and Plan: Elevated blood pressure reading Home blood pressure monitoring over past 6 weeks are all norma DASH diet and commitment to daily physical activity for a minimum of 30 minutes discussed and encouraged, as a part of hypertension management. The importance of attaining a healthy weight is also discussed.  BP/Weight 07/23/2020 06/09/2020 05/26/2020 04/16/2020 04/13/2020 01/30/2020 9/37/9024  Systolic BP 097 353 299 242 683 419 -  Diastolic BP 72 90 94 71 82 68 -  Wt. (Lbs) 167 167 166 170 170 - 165  BMI 27.79 27.79 27.62 28.29 28.29 - 27.46     OV in 6 months, call if elevated reading obtained at home to come in to the  office  Prediabetes Patient educated about the importance of limiting  Carbohydrate intake , the need to commit to daily physical activity for a minimum of 30 minutes , and to commit weight loss. The fact that changes in all these areas will reduce or eliminate all together the development of diabetes is stressed.  Improved continue low carb diet  Diabetic Labs Latest Ref Rng & Units 05/11/2020 01/30/2020 01/29/2020 01/24/2020 04/03/2019  HbA1c <5.7 % of total Hgb 5.7(H) - - - 5.8(H)  Chol <200 mg/dL 206(H) - - - 202(H)  HDL > OR = 50 mg/dL 81 - - - 68  Calc LDL mg/dL (calc) 110(H) - - - 124(H)  Triglycerides <150 mg/dL 66 - - - 52  Creatinine 0.60 - 0.93 mg/dL 0.80 0.78 0.84 0.73 0.81   BP/Weight 07/23/2020 06/09/2020 05/26/2020 04/16/2020 04/13/2020 01/30/2020 12/30/2977  Systolic BP 892 119 417 408 144 818 -  Diastolic BP 72 90 94 71 82 68 -  Wt. (Lbs) 167 167 166 170 170 - 165  BMI 27.79 27.79 27.62 28.29 28.29 - 27.46   No flowsheet data found.    Mixed hyperlipidemia Hyperlipidemia:Low fat diet discussed and encouraged.   Lipid Panel  Lab Results  Component Value Date   CHOL 206 (H) 05/11/2020   HDL 81 05/11/2020   LDLCALC 110 (H) 05/11/2020   TRIG 66 05/11/2020   CHOLHDL 2.5 05/11/2020   Needs tpo reduce fried and fatty foods    Overweight  Patient re-educated about  the importance of  commitment to a  minimum of 150 minutes of exercise per week as able.  The importance of healthy food choices with portion control discussed, as well as eating regularly and within a 12 hour window most days. The need to choose "clean , green" food 50 to 75% of the time is discussed, as well as to make water the primary drink and set a goal of 64 ounces water daily.    Weight /BMI 07/23/2020 06/09/2020 05/26/2020  WEIGHT 167 lb 167 lb 166 lb  HEIGHT 5\' 5"  5\' 5"  5\' 5"   BMI 27.79 kg/m2 27.79 kg/m2 27.62 kg/m2        Follow Up Instructions:    I discussed the assessment and  treatment plan with the patient. The patient was provided an opportunity to ask questions and all were answered. The patient agreed with the plan and demonstrated an understanding of the instructions.   The patient was advised to call back or seek an in-person evaluation if the symptoms worsen or if the condition fails to improve as anticipated.  I provided 20 minutes of non-face-to-face time during this encounter.   Tula Nakayama, MD

## 2020-07-23 NOTE — Assessment & Plan Note (Signed)
Hyperlipidemia:Low fat diet discussed and encouraged.   Lipid Panel  Lab Results  Component Value Date   CHOL 206 (H) 05/11/2020   HDL 81 05/11/2020   LDLCALC 110 (H) 05/11/2020   TRIG 66 05/11/2020   CHOLHDL 2.5 05/11/2020   Needs tpo reduce fried and fatty foods

## 2020-07-23 NOTE — Patient Instructions (Signed)
Follow-up in office with MD in August call if you need me sooner.  Please continue to work on food choices and regular exercise for overall improved health.  I am happy that you are consistently checking your blood pressure and all values that you have obtained  are less than 140/90.  If over the next several months you start getting elevated numbers either at home or in another office please call to come in sooner.  Lab review shows that your blood sugar has improved slightly and this is great.  Keep up the good work you will soon have a normal blood sugar   Cholesterol total and bad are slightly above normal.  Need increasing intake of vegetable and fruit fresh frozen and reducing processed foods and butter and oils cheeses and egg yolks will lower your cholesterol to within the normal range I believe.  Continue to work on not only food choices but portion control and eating at certain times to help to lower your  weight to a healthy range.  Your pneumonia vaccine is overdue call us as soon as is convenient for you to come in to get this.

## 2020-08-14 ENCOUNTER — Other Ambulatory Visit (HOSPITAL_COMMUNITY)
Admission: RE | Admit: 2020-08-14 | Discharge: 2020-08-14 | Disposition: A | Discharge status: Home / Self Care | Payer: Medicare Other | Source: Ambulatory Visit | Attending: Nurse Practitioner | Admitting: Nurse Practitioner

## 2020-08-14 ENCOUNTER — Other Ambulatory Visit: Payer: Self-pay

## 2020-08-14 DIAGNOSIS — E038 Other specified hypothyroidism: Secondary | ICD-10-CM | POA: Insufficient documentation

## 2020-08-14 LAB — T4, FREE: Free T4: 1.46 ng/dL — ABNORMAL HIGH (ref 0.61–1.12)

## 2020-08-14 LAB — TSH: TSH: 3.281 u[IU]/mL (ref 0.350–4.500)

## 2020-08-19 ENCOUNTER — Other Ambulatory Visit: Payer: Self-pay

## 2020-08-19 ENCOUNTER — Encounter: Payer: Self-pay | Admitting: Nurse Practitioner

## 2020-08-19 ENCOUNTER — Ambulatory Visit (INDEPENDENT_AMBULATORY_CARE_PROVIDER_SITE_OTHER): Payer: Medicare Other | Admitting: Nurse Practitioner

## 2020-08-19 VITALS — BP 165/94 | HR 76 | Ht 65.0 in | Wt 169.6 lb

## 2020-08-19 DIAGNOSIS — E038 Other specified hypothyroidism: Secondary | ICD-10-CM | POA: Diagnosis not present

## 2020-08-19 NOTE — Progress Notes (Signed)
08/19/2020        Endocrinology Follow Up Visit    Subjective:    Patient ID: Julie May, female    DOB: Jul 21, 1950, PCP Fayrene Helper, MD   Past Medical History:  Diagnosis Date  . Bilateral impacted cerumen 04/10/2019  . Callus of foot 11/07/2017  . Foot pain, bilateral 05/23/2013  . Hyperlipidemia   . Hyperpotassemia   . Hypertension    new dx 11/12/2010  . Hypothyroidism   . OA (osteoarthritis)   . Other abnormal glucose   . Overweight(278.02)   . Polycythemia, secondary   . Routine general medical examination at a health care facility   . THYROID NODULE 01/18/2010   Qualifier: Diagnosis of  By: Loanne Drilling MD, Jacelyn Pi   . Trigger finger (acquired)   . Unspecified disorder of thyroid   . 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 COLONOSCOPY N/A 03/30/2016   Procedure: COLONOSCOPY;  Surgeon: Rogene Houston, MD;  Location: [facility];  Service: Endoscopy;  Laterality: N/A;  1030  . TONSILECTOMY, ADENOIDECTOMY, BILATERAL MYRINGOTOMY AND TUBES    . TONSILLECTOMY    . uterine polyp removal      Social History   Socioeconomic History  . Marital status: Married    Spouse name: Not on file  . Number of children: 0  . Years of education: Not on file  . Highest education level: Not on file  Occupational History  . Occupation: Clinical cytogeneticist   Tobacco Use  . Smoking status: Never Smoker  . Smokeless tobacco: Never Used  Vaping Use  . Vaping Use: Never used  Substance and Sexual Activity  . Alcohol use: Yes    Comment: occ.  . Drug use: No  . Sexual activity: Yes    Birth control/protection: Post-menopausal  Other Topics Concern  . Not on file  Social History Narrative  . Not on file   Social Determinants of Health   Financial Resource Strain: Low Risk   . Difficulty of Paying Living Expenses: Not hard at all  Food Insecurity: No Food Insecurity  . Worried About Charity fundraiser in the Last Year: Never true  .  Ran Out of Food in the Last Year: Never true  Transportation Needs: No Transportation Needs  . Lack of Transportation (Medical): No  . Lack of Transportation (Non-Medical): No  Physical Activity: Sufficiently Active  . Days of Exercise per Week: 7 days  . Minutes of Exercise per Session: 30 min  Stress: No Stress Concern Present  . Feeling of Stress : Not at all  Social Connections: Moderately Integrated  . Frequency of Communication with Friends and Family: More than three times a week  . Frequency of Social Gatherings with Friends and Family: More than three times a week  . Attends Religious Services: More than 4 times per year  . Active Member of Clubs or Organizations: No  . Attends Archivist Meetings: Never  . Marital Status: Married   Outpatient Encounter Medications as of 08/19/2020  Medication Sig  . Cholecalciferol (VITAMIN D) 2000 units CAPS Take 2,000 Units by mouth daily with lunch.   Serita Grammes 75 MCG CAPS Take 1 capsule (75 mcg total) by mouth daily before breakfast.   No facility-administered encounter medications on file as of 08/19/2020.   ALLERGIES: No Known Allergies VACCINATION STATUS: Immunization History  Administered Date(s) Administered  . Fluad Quad(high Dose 65+) 04/10/2019, 04/13/2020  . Influenza  Split 06/22/2011  . Influenza Whole 05/18/2006, 04/30/2007, 04/14/2008  . Influenza,inj,Quad PF,6+ Mos 05/31/2013, 05/28/2014, 04/14/2015, 04/27/2016, 05/16/2017, 04/13/2018  . Moderna Sars-Covid-2 Vaccination 08/23/2019, 09/20/2019, 05/23/2020  . Pneumococcal Conjugate-13 09/14/2018  . Td 08/13/2009  . Zoster 03/23/2011    Thyroid Problem Presents for follow-up visit. Patient reports no anxiety, cold intolerance, constipation, depressed mood, diarrhea, fatigue, heat intolerance, leg swelling, palpitations, tremors, weight gain or weight loss. The symptoms have been stable.    Review of systems  Constitutional: + Minimally fluctuating body  weight (reports difficulty losing weight),  current Body mass index is 28.22 kg/m. , no fatigue, no subjective hyperthermia, no subjective hypothermia Eyes: no blurry vision, no xerophthalmia ENT: no sore throat, no nodules palpated in throat, no dysphagia/odynophagia, no hoarseness Cardiovascular: no chest pain, no shortness of breath, no palpitations, no leg swelling Respiratory: no cough, no shortness of breath Gastrointestinal: no nausea/vomiting/diarrhea Musculoskeletal: no muscle/joint aches Skin: no rashes, no hyperemia Neurological: no tremors, no numbness, no tingling, no dizziness Psychiatric: no depression, no anxiety   Objective:    BP (!) 165/94 (BP Location: Left Arm, Patient Position: Sitting)   Pulse 76   Ht 5\' 5"  (1.651 m)   Wt 169 lb 9.6 oz (76.9 kg)   BMI 28.22 kg/m   Wt Readings from Last 3 Encounters:  08/19/20 169 lb 9.6 oz (76.9 kg)  07/23/20 167 lb (75.8 kg)  06/09/20 167 lb (75.8 kg)    BP Readings from Last 3 Encounters:  08/19/20 (!) 165/94  07/23/20 124/72  06/09/20 (!) 147/90    Physical Exam- Limited  Constitutional:  Body mass index is 28.22 kg/m. , not in acute distress, anxious state of mind Eyes:  EOMI, no exophthalmos Neck: Supple Thyroid: No gross goiter Cardiovascular: RRR, no murmers, rubs, or gallops, no edema Respiratory: Adequate breathing efforts, no crackles, rales, rhonchi, or wheezing Musculoskeletal: no gross deformities, strength intact in all four extremities, no gross restriction of joint movements Skin:  no rashes, no hyperemia Neurological: slight tremor with outstretched hands bilaterally   CMP     Component Value Date/Time   NA 132 (L) 05/11/2020 0000   K 4.5 05/11/2020 0000   CL 94 (L) 05/11/2020 0000   CO2 28 05/11/2020 0000   GLUCOSE 102 (H) 05/11/2020 0000   BUN 9 05/11/2020 0000   CREATININE 0.80 05/11/2020 0000   CALCIUM 9.9 05/11/2020 0000   PROT 7.3 05/11/2020 0000   ALBUMIN 4.3 04/03/2019 0934    AST 22 05/11/2020 0000   ALT 16 05/11/2020 0000   ALKPHOS 82 04/03/2019 0934   BILITOT 0.5 05/11/2020 0000   GFRNONAA 75 05/11/2020 0000   GFRAA 87 05/11/2020 0000     Diabetic Labs (most recent): Lab Results  Component Value Date   HGBA1C 5.7 (H) 05/11/2020   HGBA1C 5.8 (H) 04/03/2019   HGBA1C 5.8 (H) 08/10/2018      Lipid Panel     Component Value Date/Time   CHOL 206 (H) 05/11/2020 0000   TRIG 66 05/11/2020 0000   HDL 81 05/11/2020 0000   CHOLHDL 2.5 05/11/2020 0000   VLDL 10 04/03/2019 0934   LDLCALC 110 (H) 05/11/2020 0000   Recent Results (from the past 2160 hour(s))  TSH     Status: None   Collection Time: 08/14/20 10:29 AM  Result Value Ref Range   TSH 3.281 0.350 - 4.500 uIU/mL    Comment: Performed by a 3rd Generation assay with a functional sensitivity of <=0.01 uIU/mL. Performed at [facility], [address] [place], [place] [address]   T4, free     Status: Abnormal   Collection Time: 08/14/20 10:29 AM  Result Value Ref Range   Free T4 1.46 (H) 0.61 - 1.12 ng/dL    Comment: (NOTE) Biotin ingestion may interfere with free T4 tests. If the results are inconsistent with the TSH level, previous test results, or the clinical presentation, then consider biotin interference. If needed, order repeat testing after stopping biotin. Performed at [facility], [place] [address], [place], [place] [address]      Assessment & Plan:   1.  Primary hypothyroidism -Her previsit thyroid function tests are consistent with appropriate hormone replacement (borderline too much, which is acceptable for this winter season).  She is advised to continue Tirosint 75 mcg po daily before breakfast.  She was concerned about her TSH being "high" when she was told ideally her TSH should be around 2 (she had fluctuated between 2 doses previously depending on the season).  We discussed the importance of looking at [facility] and Free T4 levels together when evaluating response to thyroid  hormone replacement.    -She did not tolerate levothyroxine or Synthroid in the past.     - We discussed about the correct intake of her thyroid hormone, on empty stomach at fasting, with water, separated by at least 30 minutes from breakfast and other medications,  and separated by more than 4 hours from calcium, iron, multivitamins, acid reflux medications (PPIs). -Patient is made aware of the fact that thyroid hormone replacement is needed for life, dose to be adjusted by periodic monitoring of thyroid function tests.   2. Nontoxic multinodular goiter Her last ultrasound from 2017 was unchanged from previous studies, showing no significant findings.  No follow up thyroid US needed at this time.   - I advised patient to maintain close follow up with Fayrene Helper, MD for primary care needs.       - Time spent on this patient care encounter:  20 minutes of which 50% was spent in  counseling and the rest reviewing  her current and  previous labs / studies and medications  doses and developing a plan for long term care. [name]  participated in the discussions, expressed understanding, and voiced agreement with the above plans.  All questions were answered to her satisfaction. she is encouraged to contact clinic should she have any questions or concerns prior to her return visit.   Follow up plan: Return in about 4 months (around 12/17/2020) for Thyroid follow up, Previsit labs.  Rayetta Pigg, [facility] [address] [place], [place] [address] Phone: [phone] Fax: [phone]  08/19/2020, 11:25 AM

## 2020-08-19 NOTE — Patient Instructions (Signed)

## 2020-08-25 MED FILL — TIROSINT 75 MCG CAPS: 75 | 30 days supply | Qty: 30 | Fill #1

## 2020-08-26 ENCOUNTER — Ambulatory Visit: Payer: Medicare Other | Admitting: Family Medicine

## 2020-09-09 NOTE — Telephone Encounter (Signed)
09/09/2020                                            To whom it may concern  [name]  This is to request a special exception/assistance for this patient to obtain coverage for  Tirosint to treat her hypothyroidism.   This patient has tried all other available options of thyroid hormone replacement including levothyroxine, Synthroid, Levoxyl in the past with significant side effects or intolerance.  She has done very well on Tirosint for the last 3 to 4 years.  Due to recent insurance change, she is losing coverage for this brand-name medication. Any assistance she is given to get coverage for Tirosint is appreciated. Thank you. Glade Lloyd, MD

## 2020-09-28 ENCOUNTER — Other Ambulatory Visit (HOSPITAL_BASED_OUTPATIENT_CLINIC_OR_DEPARTMENT_OTHER): Payer: Self-pay

## 2020-10-24 MED FILL — Levothyroxine Sodium Cap 75 MCG: ORAL | 90 days supply | Qty: 90 | Fill #0 | Status: AC

## 2020-10-26 ENCOUNTER — Other Ambulatory Visit (HOSPITAL_COMMUNITY): Payer: Self-pay

## 2020-10-27 ENCOUNTER — Other Ambulatory Visit (HOSPITAL_COMMUNITY): Payer: Self-pay

## 2020-10-28 ENCOUNTER — Other Ambulatory Visit (HOSPITAL_COMMUNITY): Payer: Self-pay

## 2020-10-30 DIAGNOSIS — Z23 Encounter for immunization: Secondary | ICD-10-CM | POA: Diagnosis not present

## 2020-11-24 DIAGNOSIS — H2513 Age-related nuclear cataract, bilateral: Secondary | ICD-10-CM | POA: Diagnosis not present

## 2020-11-24 DIAGNOSIS — H01001 Unspecified blepharitis right upper eyelid: Secondary | ICD-10-CM | POA: Diagnosis not present

## 2020-12-17 ENCOUNTER — Ambulatory Visit: Payer: Medicare Other | Admitting: "Endocrinology

## 2020-12-18 DIAGNOSIS — E038 Other specified hypothyroidism: Secondary | ICD-10-CM | POA: Diagnosis not present

## 2020-12-19 LAB — TSH: TSH: 4.49 u[IU]/mL (ref 0.450–4.500)

## 2020-12-19 LAB — T4, FREE: Free T4: 1.71 ng/dL (ref 0.82–1.77)

## 2020-12-24 ENCOUNTER — Other Ambulatory Visit: Payer: Self-pay

## 2020-12-24 ENCOUNTER — Ambulatory Visit (INDEPENDENT_AMBULATORY_CARE_PROVIDER_SITE_OTHER): Payer: Medicare Other | Admitting: "Endocrinology

## 2020-12-24 ENCOUNTER — Encounter: Payer: Self-pay | Admitting: "Endocrinology

## 2020-12-24 VITALS — BP 161/82 | HR 78 | Ht 65.0 in | Wt 167.8 lb

## 2020-12-24 DIAGNOSIS — I10 Essential (primary) hypertension: Secondary | ICD-10-CM | POA: Diagnosis not present

## 2020-12-24 DIAGNOSIS — E782 Mixed hyperlipidemia: Secondary | ICD-10-CM | POA: Diagnosis not present

## 2020-12-24 DIAGNOSIS — E038 Other specified hypothyroidism: Secondary | ICD-10-CM | POA: Diagnosis not present

## 2020-12-24 DIAGNOSIS — R7303 Prediabetes: Secondary | ICD-10-CM | POA: Diagnosis not present

## 2020-12-24 MED ORDER — LOSARTAN POTASSIUM 25 MG PO TABS: 25.0000 mg | ORAL_TABLET | Freq: Every day | ORAL | 1 refills | Status: DC

## 2020-12-24 NOTE — Progress Notes (Signed)
Pt states she's anxious "I feel charged up" x 2 months. Experiencing legs cramping off & on x 1 month, numbness in bilateral hands, R hand worse than L x 1 month.

## 2020-12-24 NOTE — Progress Notes (Signed)
12/24/2020        Endocrinology Follow Up Visit   Subjective:    Patient ID: Julie May, female    DOB: 11-25-1950, PCP Fayrene Helper, MD   Past Medical History:  Diagnosis Date   Bilateral impacted cerumen 04/10/2019   Callus of foot 11/07/2017   Foot pain, bilateral 05/23/2013   Hyperlipidemia    Hyperpotassemia    Hypertension    new dx 11/12/2010   Hypothyroidism    OA (osteoarthritis)    Other abnormal glucose    Overweight(278.02)    Polycythemia, secondary    Routine general medical examination at a health care facility    THYROID NODULE 01/18/2010   Qualifier: Diagnosis of  By: Loanne Drilling MD, Sean A    Trigger finger (acquired)    Unspecified disorder of thyroid    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   Past Surgical History:  Procedure Laterality Date   COLONOSCOPY N/A 03/30/2016   Procedure: COLONOSCOPY;  Surgeon: Rogene Houston, MD;  Location: [facility];  Service: Endoscopy;  Laterality: N/A;  1030   TONSILECTOMY, ADENOIDECTOMY, BILATERAL MYRINGOTOMY AND TUBES     TONSILLECTOMY     uterine polyp removal      Social History   Socioeconomic History   Marital status: Married    Spouse name: Not on file   Number of children: 0   Years of education: Not on file   Highest education level: Not on file  Occupational History   Occupation: book Therapist, nutritional   Tobacco Use   Smoking status: Never   Smokeless tobacco: Never  Vaping Use   Vaping Use: Never used  Substance and Sexual Activity   Alcohol use: Yes    Comment: occ.   Drug use: No   Sexual activity: Yes    Birth control/protection: Post-menopausal  Other Topics Concern   Not on file  Social History Narrative   Not on file   Social Determinants of Health   Financial Resource Strain: Low Risk    Difficulty of Paying Living Expenses: Not hard at all  Food Insecurity: No Food Insecurity   Worried About Charity fundraiser in the Last Year: Never true   [place] in the Last Year: Never true   Transportation Needs: No Transportation Needs   Lack of Transportation (Medical): No   Lack of Transportation (Non-Medical): No  Physical Activity: Sufficiently Active   Days of Exercise per Week: 7 days   Minutes of Exercise per Session: 30 min  Stress: No Stress Concern Present   Feeling of Stress : Not at all  Social Connections: Moderately Integrated   Frequency of Communication with Friends and Family: More than three times a week   Frequency of Social Gatherings with Friends and Family: More than three times a week   Attends Religious Services: More than 4 times per year   Active Member of [employer] or Organizations: No   Attends Archivist Meetings: Never   Marital Status: Married   Outpatient Encounter Medications as of 12/24/2020  Medication Sig   losartan (COZAAR) 25 MG tablet Take 1 tablet (25 mg total) by mouth daily.   Cholecalciferol (VITAMIN D) 2000 units CAPS Take 2,000 Units by mouth daily with lunch.    TIROSINT 75 MCG CAPS TAKE 1 CAPSULE BY MOUTH DAILY BEFORE BREAKFAST.   No facility-administered encounter medications on file as of 12/24/2020.   ALLERGIES: No Known Allergies VACCINATION STATUS: Immunization History  Administered Date(s) Administered  Fluad Quad(high Dose 65+) 04/10/2019, 04/13/2020   Influenza Split 06/22/2011   Influenza Whole 05/18/2006, 04/30/2007, 04/14/2008   Influenza,inj,Quad PF,6+ Mos 05/31/2013, 05/28/2014, 04/14/2015, 04/27/2016, 05/16/2017, 04/13/2018   Moderna Sars-Covid-2 Vaccination 08/23/2019, 09/20/2019, 05/23/2020   Pneumococcal Conjugate-13 09/14/2018   Td 08/13/2009   Zoster, Live 03/23/2011    Thyroid Problem Presents for follow-up visit. Patient reports no anxiety, cold intolerance, constipation, depressed mood, diarrhea, fatigue, heat intolerance, leg swelling, palpitations, tremors, weight gain or weight loss. The symptoms have been stable.  Hypertension: New diagnosis.  She is not on treatment. - Review of  systems  Constitutional: + Minimally fluctuating body weight.,  current Body mass index is 27.92 kg/m. , no fatigue, no subjective hyperthermia, no subjective hypothermia    Objective:    BP (!) 161/82   Pulse 78   Ht 5\' 5"  (1.651 m)   Wt 167 lb 12.8 oz (76.1 kg)   BMI 27.92 kg/m   Wt Readings from Last 3 Encounters:  12/24/20 167 lb 12.8 oz (76.1 kg)  08/19/20 169 lb 9.6 oz (76.9 kg)  07/23/20 167 lb (75.8 kg)    BP Readings from Last 3 Encounters:  12/24/20 (!) 161/82  08/19/20 (!) 165/94  07/23/20 124/72    Physical Exam- Limited  Constitutional:  Body mass index is 27.92 kg/m. , not in acute distress, anxious state of mind Eyes:  EOMI, no exophthalmos Neck: Supple, palpable thyroid.  CMP     Component Value Date/Time   NA 132 (L) 05/11/2020 0000   K 4.5 05/11/2020 0000   CL 94 (L) 05/11/2020 0000   CO2 28 05/11/2020 0000   GLUCOSE 102 (H) 05/11/2020 0000   BUN 9 05/11/2020 0000   CREATININE 0.80 05/11/2020 0000   CALCIUM 9.9 05/11/2020 0000   PROT 7.3 05/11/2020 0000   ALBUMIN 4.3 04/03/2019 0934   AST 22 05/11/2020 0000   ALT 16 05/11/2020 0000   ALKPHOS 82 04/03/2019 0934   BILITOT 0.5 05/11/2020 0000   GFRNONAA 75 05/11/2020 0000   GFRAA 87 05/11/2020 0000     Diabetic Labs (most recent): Lab Results  Component Value Date   HGBA1C 5.7 (H) 05/11/2020   HGBA1C 5.8 (H) 04/03/2019   HGBA1C 5.8 (H) 08/10/2018      Lipid Panel     Component Value Date/Time   CHOL 206 (H) 05/11/2020 0000   TRIG 66 05/11/2020 0000   HDL 81 05/11/2020 0000   CHOLHDL 2.5 05/11/2020 0000   VLDL 10 04/03/2019 0934   LDLCALC 110 (H) 05/11/2020 0000   Recent Results (from the past 2160 hour(s))  T4, free     Status: None   Collection Time: 12/18/20  9:47 AM  Result Value Ref Range   Free T4 1.71 0.82 - 1.77 ng/dL  TSH     Status: None   Collection Time: 12/18/20  9:47 AM  Result Value Ref Range   TSH 4.490 0.450 - 4.500 uIU/mL     Assessment & Plan:    1.  Primary hypothyroidism -Her previsit thyroid function tests are consistent with appropriate thyroid hormone replacement.  She is advised to stay on same dose of Tirosint 75 mcg p.o. daily before breakfast.    - We discussed about the correct intake of her thyroid hormone, on empty stomach at fasting, with water, separated by at least 30 minutes from breakfast and other medications,  and separated by more than 4 hours from calcium, iron, multivitamins, acid reflux medications (PPIs). -Patient is made aware  of the fact that thyroid hormone replacement is needed for life, dose to be adjusted by periodic monitoring of thyroid function tests.  -She did not tolerate levothyroxine or Synthroid in the past.    2. Nontoxic multinodular goiter Her last ultrasound from 2017 was unchanged from previous studies, showing no significant findings.  She will be considered for 1 more thyroid ultrasound for surveillance before her next visit.  3.  Hypertension Patient was observed to have significantly elevated blood pressure on several instances.  She generally dislikes medications she is approached for low-dose losartan and hesitantly accepts.  I prescribed losartan 25 mg p.o. daily with breakfast.   - I advised patient to maintain close follow up with Fayrene Helper, MD for primary care needs.   I spent 25 minutes in the care of the patient today including review of labs from Thyroid Function, CMP, and other relevant labs ; imaging/biopsy records (current and previous including abstractions from other facilities); face-to-face time discussing  her lab results and symptoms, medications doses, her options of short and long term treatment based on the latest standards of care / guidelines;   and documenting the encounter.  [name]  participated in the discussions, expressed understanding, and voiced agreement with the above plans.  All questions were answered to her satisfaction. she is encouraged to  contact clinic should she have any questions or concerns prior to her return visit.    Follow up plan: Return in about 6 months (around 06/25/2021) for F/U with Pre-visit Labs, A1c -NV, Thyroid / Neck Ultrasound.  Rayetta Pigg, [facility] [address] [place], [place] [address] Phone: [phone] Fax: [phone]  12/24/2020, 12:28 PM

## 2021-01-05 ENCOUNTER — Ambulatory Visit (HOSPITAL_COMMUNITY): Payer: BLUE CROSS/BLUE SHIELD

## 2021-01-22 ENCOUNTER — Other Ambulatory Visit (HOSPITAL_COMMUNITY): Payer: Self-pay

## 2021-01-22 MED FILL — Levothyroxine Sodium Cap 75 MCG: ORAL | 90 days supply | Qty: 90 | Fill #1 | Status: AC

## 2021-01-25 ENCOUNTER — Other Ambulatory Visit (HOSPITAL_COMMUNITY): Payer: Self-pay

## 2021-02-08 ENCOUNTER — Encounter: Payer: Self-pay | Admitting: Family Medicine

## 2021-02-11 ENCOUNTER — Other Ambulatory Visit: Payer: Self-pay

## 2021-02-11 ENCOUNTER — Ambulatory Visit (INDEPENDENT_AMBULATORY_CARE_PROVIDER_SITE_OTHER): Payer: Medicare Other | Admitting: Family Medicine

## 2021-02-11 ENCOUNTER — Encounter: Payer: Self-pay | Admitting: Family Medicine

## 2021-02-11 VITALS — BP 134/84 | HR 90 | Ht 65.25 in | Wt 158.0 lb

## 2021-02-11 DIAGNOSIS — I10 Essential (primary) hypertension: Secondary | ICD-10-CM

## 2021-02-11 DIAGNOSIS — M8580 Other specified disorders of bone density and structure, unspecified site: Secondary | ICD-10-CM | POA: Diagnosis not present

## 2021-02-11 DIAGNOSIS — R2681 Unsteadiness on feet: Secondary | ICD-10-CM | POA: Diagnosis not present

## 2021-02-11 DIAGNOSIS — E663 Overweight: Secondary | ICD-10-CM

## 2021-02-11 DIAGNOSIS — Z1231 Encounter for screening mammogram for malignant neoplasm of breast: Secondary | ICD-10-CM | POA: Diagnosis not present

## 2021-02-11 DIAGNOSIS — E038 Other specified hypothyroidism: Secondary | ICD-10-CM | POA: Diagnosis not present

## 2021-02-11 DIAGNOSIS — Z1211 Encounter for screening for malignant neoplasm of colon: Secondary | ICD-10-CM | POA: Diagnosis not present

## 2021-02-11 DIAGNOSIS — M79671 Pain in right foot: Secondary | ICD-10-CM | POA: Diagnosis not present

## 2021-02-11 DIAGNOSIS — E782 Mixed hyperlipidemia: Secondary | ICD-10-CM

## 2021-02-11 DIAGNOSIS — L84 Corns and callosities: Secondary | ICD-10-CM

## 2021-02-11 DIAGNOSIS — E559 Vitamin D deficiency, unspecified: Secondary | ICD-10-CM | POA: Diagnosis not present

## 2021-02-11 NOTE — Patient Instructions (Addendum)
Please cancel 8/19. Follow up and reschedule to early Feb, call if you need me sooner  Nurse visit for pneumonia 20 and flu vaccine  early to mid  September  Please get your Shingrix vaccines at your local pharmacy start as soon as possible.  You are referred to podiatry regarding the callus on your right  foot as well as bilateral foot.pain  cBC, chem 7 and EGFR and vit D today  Blood pressure is good  Congrats on weight loss  Please schedule mammogram at checkout  Thanks for choosing [facility], we consider it a privelige to serve you.

## 2021-02-11 NOTE — Assessment & Plan Note (Addendum)
Right foot callous, painful x 1 year, refer Podiatry

## 2021-02-11 NOTE — Assessment & Plan Note (Addendum)
Increased in satbility of right foot esp feels as though walking on lateral aspect, refer to Podiatry

## 2021-02-11 NOTE — Telephone Encounter (Signed)
Appt made for today

## 2021-02-12 LAB — CBC
Hematocrit: 42.3 % (ref 34.0–46.6)
Hemoglobin: 14.4 g/dL (ref 11.1–15.9)
MCH: 28.3 pg (ref 26.6–33.0)
MCHC: 34 g/dL (ref 31.5–35.7)
MCV: 83 fL (ref 79–97)
Platelets: 308 10*3/uL (ref 150–450)
RBC: 5.08 x10E6/uL (ref 3.77–5.28)
RDW: 13.1 % (ref 11.7–15.4)
WBC: 6.8 10*3/uL (ref 3.4–10.8)

## 2021-02-12 LAB — BMP8+EGFR
BUN/Creatinine Ratio: 14 (ref 12–28)
BUN: 10 mg/dL (ref 8–27)
CO2: 24 mmol/L (ref 20–29)
Calcium: 9.4 mg/dL (ref 8.7–10.3)
Chloride: 96 mmol/L (ref 96–106)
Creatinine, Ser: 0.69 mg/dL (ref 0.57–1.00)
Glucose: 95 mg/dL (ref 65–99)
Potassium: 5 mmol/L (ref 3.5–5.2)
Sodium: 134 mmol/L (ref 134–144)
eGFR: 93 mL/min/{1.73_m2} (ref >59)

## 2021-02-12 LAB — VITAMIN D 25 HYDROXY (VIT D DEFICIENCY, FRACTURES): Vit D, 25-Hydroxy: 45.9 ng/mL (ref 30.0–100.0)

## 2021-02-15 ENCOUNTER — Encounter: Payer: Self-pay | Admitting: Family Medicine

## 2021-02-15 NOTE — Progress Notes (Signed)
   Julie May     MRN: [id number]      DOB: 1950/10/15   HPI Julie May is here for follow up and re-evaluation of chronic medical conditions, medication management and review of any available recent lab and radiology data.  Preventive health is updated, specifically  Cancer screening and Immunization.   Questions or concerns regarding consultations or procedures which the PT has had in the interim are  addressed. The PT denies any adverse reactions to current medications since the last visit.  C/o increased and disabling right foot pain , has a callous which limits her ability to weight bear   ROS Denies recent fever or chills. Denies sinus pressure, nasal congestion, ear pain or sore throat. Denies chest congestion, productive cough or wheezing. Denies chest pains, palpitations and leg swelling Denies abdominal pain, nausea, vomiting,diarrhea or constipation.   Denies dysuria, frequency, hesitancy or incontinence. Denies joint pain, swelling and limitation in mobility. Denies headaches, seizures, numbness, or tingling. Denies depression, anxiety or insomnia.    PE  BP 134/84   Pulse 90   Ht 5' 5.25" (1.657 m)   Wt 158 lb (71.7 kg)   SpO2 97%   BMI 26.09 kg/m   Patient alert and oriented and in no cardiopulmonary distress.  HEENT: No facial asymmetry, EOMI,     Neck supple .  Chest: Clear to auscultation bilaterally.  CVS: S1, S2 no murmurs, no S3.Regular rate.  ABD: Soft non tender.   Ext: No edema  MS: Adequate ROM spine, shoulders, hips and knees.  Skin: Intact, no ulcerations or rash noted.  Psych: Good eye contact, normal affect. Memory intact not anxious or depressed appearing.  CNS: CN 2-12 intact, power,  normal throughout.no focal deficits noted.   Assessment & Plan  Callus of foot Right foot callous, painful x 1 year, refer Podiatry  Right foot pain Increased in satbility of right foot esp feels as though walking on lateral aspect, refer to  Podiatry  Essential hypertension, benign Controlled, no change in medication DASH diet and commitment to daily physical activity for a minimum of 30 minutes discussed and encouraged, as a part of hypertension management. The importance of attaining a healthy weight is also discussed.  BP/Weight 02/11/2021 12/24/2020 08/19/2020 07/23/2020 06/09/2020 05/26/2020 [id number]  Systolic BP Q000111Q Q000111Q 123XX123 [id number] Q000111Q [id number] 0000000  Diastolic BP 84 82 94 72 90 94 71  Wt. (Lbs) 158 167.8 169.6 167 167 166 170  BMI 26.09 27.92 28.22 27.79 27.79 27.62 28.29       Overweight Improved. Pt applauded on succesful weight loss through lifestyle change, and encouraged to continue same. Weight loss goal set for the next several months.   Osteopenia Managed by Endo  Mixed hyperlipidemia Hyperlipidemia:Low fat diet discussed and encouraged.   Lipid Panel  Lab Results  Component Value Date   CHOL 206 (H) 05/11/2020   HDL 81 05/11/2020   LDLCALC 110 (H) 05/11/2020   TRIG 66 05/11/2020   CHOLHDL 2.5 05/11/2020     Updated lab needed at/ before next visit.   Hypothyroidism Managed by Endo, controlled

## 2021-02-15 NOTE — Assessment & Plan Note (Signed)
Hyperlipidemia:Low fat diet discussed and encouraged.   Lipid Panel  Lab Results  Component Value Date   CHOL 206 (H) 05/11/2020   HDL 81 05/11/2020   LDLCALC 110 (H) 05/11/2020   TRIG 66 05/11/2020   CHOLHDL 2.5 05/11/2020     Updated lab needed at/ before next visit.

## 2021-02-15 NOTE — Assessment & Plan Note (Signed)
Managed by Endo, controlled

## 2021-02-15 NOTE — Assessment & Plan Note (Signed)
Improved. Pt applauded on succesful weight loss through lifestyle change, and encouraged to continue same. Weight loss goal set for the next several months.  

## 2021-02-15 NOTE — Assessment & Plan Note (Signed)
Managed by Endo 

## 2021-02-15 NOTE — Assessment & Plan Note (Signed)
Controlled, no change in medication DASH diet and commitment to daily physical activity for a minimum of 30 minutes discussed and encouraged, as a part of hypertension management. The importance of attaining a healthy weight is also discussed.  BP/Weight 02/11/2021 12/24/2020 08/19/2020 07/23/2020 06/09/2020 05/26/2020 [id number]  Systolic BP Q000111Q Q000111Q 123XX123 A999333 Q000111Q A999333 0000000  Diastolic BP 84 82 94 72 90 94 71  Wt. (Lbs) 158 167.8 169.6 167 167 166 170  BMI 26.09 27.92 28.22 27.79 27.79 27.62 28.29

## 2021-02-17 ENCOUNTER — Other Ambulatory Visit: Payer: Self-pay

## 2021-02-17 ENCOUNTER — Ambulatory Visit (INDEPENDENT_AMBULATORY_CARE_PROVIDER_SITE_OTHER): Payer: Medicare Other | Admitting: Podiatry

## 2021-02-17 ENCOUNTER — Ambulatory Visit (INDEPENDENT_AMBULATORY_CARE_PROVIDER_SITE_OTHER): Payer: Medicare Other

## 2021-02-17 DIAGNOSIS — L989 Disorder of the skin and subcutaneous tissue, unspecified: Secondary | ICD-10-CM | POA: Diagnosis not present

## 2021-02-17 DIAGNOSIS — M2041 Other hammer toe(s) (acquired), right foot: Secondary | ICD-10-CM | POA: Diagnosis not present

## 2021-02-17 DIAGNOSIS — Q6612 Congenital talipes calcaneovarus, left foot: Secondary | ICD-10-CM

## 2021-02-17 DIAGNOSIS — Q6611 Congenital talipes calcaneovarus, right foot: Secondary | ICD-10-CM

## 2021-02-17 DIAGNOSIS — M2042 Other hammer toe(s) (acquired), left foot: Secondary | ICD-10-CM | POA: Diagnosis not present

## 2021-02-17 DIAGNOSIS — Q661 Congenital talipes calcaneovarus, unspecified foot: Secondary | ICD-10-CM

## 2021-02-17 NOTE — Progress Notes (Signed)
g

## 2021-02-17 NOTE — Progress Notes (Signed)
   HPI: 70 y.o. female presenting today as a new patient for evaluation of bilateral foot pain this been going on for several years.  Most recently she has had an increase in right foot pain due to a callus that is developed to the plantar aspect of the foot.  Patient states that she has seen multiple podiatrist in the past as well as chiropractors to evaluate her feet.  She has chronic pain that is been ongoing for several years.  She has tried both over-the-counter and prescription strength orthotics.  She presents for further treatment and evaluation  Past Medical History:  Diagnosis Date   Bilateral impacted cerumen 04/10/2019   Callus of foot 11/07/2017   Foot pain, bilateral 05/23/2013   Hyperlipidemia    Hyperpotassemia    Hypertension    new dx 11/12/2010   Hypothyroidism    OA (osteoarthritis)    Other abnormal glucose    Overweight(278.02)    Polycythemia, secondary    Routine general medical examination at a health care facility    THYROID NODULE 01/18/2010   Qualifier: Diagnosis of  By: Loanne Drilling MD, Sean A    Trigger finger (acquired)    Unspecified disorder of thyroid    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  Dermatology: Skin is warm, dry and supple bilateral lower extremities. Negative for open lesions or macerations.  Hyperkeratotic preulcerative callus lesion also noted to the subphase MTPJ of the right foot with associated tenderness to palpation  Vascular: Palpable pedal pulses bilaterally. No edema or erythema noted. Capillary refill within normal limits.  Neurological: Epicritic and protective threshold grossly intact bilaterally.   Musculoskeletal Exam: Cavovarus foot deformity noted bilateral with hammertoe contracture digits 1-5 bilateral  Radiographic Exam:  Normal osseous mineralization.  Degenerative changes noted throughout the foot consistent with osteoarthritis.  Increased  calcaneal inclination angle with a metatarsal declination angle consistent with high arch foot and cavovarus foot deformity.  Hammertoe contracture also noted 1-5 bilateral  Assessment: 1.  Cavovarus foot type bilateral 2.  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  Chronic pain bilateral feet 4.  Preulcerative callus lesions of fifth MTPJ right   Plan of Care:  1. Patient evaluated. X-Rays reviewed.  2.  Excisional debridement of the preulcerative callus lesion was performed using a 312 scalpel without incident or bleeding 3.  Patient was molded today for custom molded orthotics to see if this can help support the structures of her foot and alleviate pain 4.  Return to clinic in 4 weeks to pick up orthotics      Edrick Kins, DPM [facility]  Dr. Edrick Kins, DPM    [address], [place], [place] [address]                Office [phone]  Fax [phone]

## 2021-02-22 ENCOUNTER — Ambulatory Visit: Payer: BLUE CROSS/BLUE SHIELD | Admitting: Family Medicine

## 2021-02-24 DIAGNOSIS — Z1211 Encounter for screening for malignant neoplasm of colon: Secondary | ICD-10-CM | POA: Diagnosis not present

## 2021-02-26 ENCOUNTER — Ambulatory Visit: Payer: BLUE CROSS/BLUE SHIELD | Admitting: Family Medicine

## 2021-03-01 DIAGNOSIS — Z6826 Body mass index (BMI) 26.0-26.9, adult: Secondary | ICD-10-CM | POA: Diagnosis not present

## 2021-03-01 DIAGNOSIS — I10 Essential (primary) hypertension: Secondary | ICD-10-CM | POA: Diagnosis not present

## 2021-03-01 DIAGNOSIS — M5416 Radiculopathy, lumbar region: Secondary | ICD-10-CM | POA: Diagnosis not present

## 2021-03-01 DIAGNOSIS — M48062 Spinal stenosis, lumbar region with neurogenic claudication: Secondary | ICD-10-CM | POA: Diagnosis not present

## 2021-03-01 DIAGNOSIS — M4316 Spondylolisthesis, lumbar region: Secondary | ICD-10-CM | POA: Diagnosis not present

## 2021-03-04 LAB — COLOGUARD: Cologuard: NEGATIVE

## 2021-03-22 ENCOUNTER — Telehealth: Payer: Self-pay | Admitting: *Deleted

## 2021-03-22 ENCOUNTER — Other Ambulatory Visit: Payer: Self-pay

## 2021-03-22 ENCOUNTER — Other Ambulatory Visit: Payer: Medicare Other

## 2021-03-22 NOTE — Telephone Encounter (Signed)
PUO-03/22/21

## 2021-04-20 ENCOUNTER — Other Ambulatory Visit: Payer: Self-pay

## 2021-04-20 ENCOUNTER — Ambulatory Visit (INDEPENDENT_AMBULATORY_CARE_PROVIDER_SITE_OTHER): Payer: Medicare Other

## 2021-04-20 ENCOUNTER — Ambulatory Visit: Payer: Medicare Other

## 2021-04-20 DIAGNOSIS — Z23 Encounter for immunization: Secondary | ICD-10-CM | POA: Diagnosis not present

## 2021-04-22 ENCOUNTER — Other Ambulatory Visit: Payer: Self-pay | Admitting: Nurse Practitioner

## 2021-04-22 ENCOUNTER — Other Ambulatory Visit (HOSPITAL_COMMUNITY): Payer: Self-pay

## 2021-04-22 DIAGNOSIS — E038 Other specified hypothyroidism: Secondary | ICD-10-CM

## 2021-04-22 MED ORDER — TIROSINT 75 MCG PO CAPS
ORAL_CAPSULE | ORAL | 3 refills | Status: DC
  Filled 2021-04-22: qty 90, 90d supply, fill #0
  Filled 2021-07-20: qty 90, 90d supply, fill #1
  Filled 2021-10-19: qty 90, 90d supply, fill #2

## 2021-04-23 ENCOUNTER — Other Ambulatory Visit (HOSPITAL_COMMUNITY): Payer: Self-pay

## 2021-04-28 ENCOUNTER — Ambulatory Visit (INDEPENDENT_AMBULATORY_CARE_PROVIDER_SITE_OTHER): Payer: Medicare Other

## 2021-04-28 ENCOUNTER — Other Ambulatory Visit: Payer: Self-pay

## 2021-04-28 ENCOUNTER — Telehealth: Payer: Self-pay

## 2021-04-28 VITALS — BP 162/89 | HR 67 | Ht 65.0 in | Wt 153.0 lb

## 2021-04-28 DIAGNOSIS — Z0000 Encounter for general adult medical examination without abnormal findings: Secondary | ICD-10-CM | POA: Diagnosis not present

## 2021-04-28 NOTE — Progress Notes (Signed)
Subjective:   Julie May is a 70 y.o. female who presents for Medicare Annual (Subsequent) preventive examination.  Review of Systems     Cardiac Risk Factors include: advanced age (>48men, >65 women);hypertension     Objective:    Today's Vitals   04/28/21 1336  BP: (!) 162/89  Pulse: 67  SpO2: 98%  Weight: 153 lb (69.4 kg)  Height: 5\' 5"  (1.651 m)   Body mass index is 25.46 kg/m.  Advanced Directives 04/28/2021 04/16/2020 01/30/2020 01/24/2020 10/08/2019 03/30/2016 09/18/2015  Does Patient Have a Medical Advance Directive? No No No No No No No  Would patient like information on creating a medical advance directive? No - Patient declined No - Patient declined No - Patient declined No - Patient declined No - Patient declined No - patient declined information No - patient declined information    Current Medications (verified) Outpatient Encounter Medications as of 04/28/2021  Medication Sig   Cholecalciferol (VITAMIN D) 2000 units CAPS Take 2,000 Units by mouth daily with lunch.    losartan (COZAAR) 25 MG tablet Take 1 tablet (25 mg total) by mouth daily.   TIROSINT 75 MCG CAPS TAKE 1 CAPSULE BY MOUTH DAILY BEFORE BREAKFAST.   No facility-administered encounter medications on file as of 04/28/2021.    Allergies (verified) Patient has no known allergies.   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 Unice Cobble Spine   TONSILECTOMY, ADENOIDECTOMY, BILATERAL MYRINGOTOMY AND TUBES     TONSILLECTOMY     uterine polyp removal      Family History  Problem Relation Age of Onset   Heart attack Father    Stroke Mother    Hypertension Mother    Thyroid disease Sister    Hyperlipidemia Brother    Heart attack Brother    Heart disease Brother    Arthritis Other        family history    Hypertension Other        family history    Stroke Other        family history    Heart disease Other        family history    Social History   Socioeconomic History   Marital status: Married    Spouse name: [name]   Number of children: 0   Years of education: Not on file   Highest education level: Not on file  Occupational History   Occupation: Clinical cytogeneticist   Tobacco Use   Smoking status: Never   Smokeless tobacco: Never  Vaping Use   Vaping Use: Never used  Substance and Sexual  Activity   Alcohol use: Yes    Comment: occ.   Drug use: No   Sexual activity: Yes    Birth control/protection: Post-menopausal  Other Topics Concern   Not on file  Social History Narrative   Married x 36 years 2022.   Social Determinants of Health   Financial Resource Strain: Low Risk    Difficulty of Paying Living Expenses: Not hard at all  Food Insecurity: No Food Insecurity   Worried About Charity fundraiser in the Last Year: Never true   [place] in the Last Year: Never true  Transportation Needs: No Transportation Needs   Lack of Transportation (Medical): No   Lack of Transportation (Non-Medical): No  Physical Activity: Sufficiently Active   Days of Exercise per Week: 5 days   Minutes of Exercise per Session: 30 min  Stress: No Stress Concern Present   Feeling of Stress :  Not at all  Social Connections: Socially Integrated   Frequency of Communication with Friends and Family: More than three times a week   Frequency of Social Gatherings with Friends and Family: More than three times a week   Attends Religious Services: More than 4 times per year   Active Member of [employer] or Organizations: Yes   Attends Music therapist: More than 4 times per year   Marital Status: Married    Tobacco Counseling Counseling given: Not Answered   Clinical Intake:  Pre-visit preparation completed: Yes  Pain : No/denies pain     Nutritional Risks: None Diabetes: No  How often do you need to have someone help you when you read instructions, pamphlets, or other written materials from your doctor or pharmacy?: 1 - Never  Diabetic?NO  Interpreter Needed?: No  Information entered by :: MJ Dalante Minus, LPN   Activities of Daily Living In your present state of health, do you have any difficulty performing the following activities: 04/28/2021  Hearing? N  Vision? N  Difficulty concentrating or making decisions? N  Walking or climbing stairs? N  Dressing or bathing? N  Doing errands, shopping? N  Preparing Food and eating ? N  Using the Toilet? N  In the past six months, have you accidently leaked urine? N  Do you have problems with loss of bowel control? N  Managing your Medications? N  Managing your Finances? N  Housekeeping or managing your Housekeeping? N  Some recent data might be hidden    Patient Care Team: Fayrene Helper, MD as PCP - General  Indicate any recent Medical Services you may have received from other than [facility] providers in the past year (date may be approximate).     Assessment:   This is a routine wellness examination for [facility].  Hearing/Vision screen Hearing Screening - Comments:: No hearing issues. Vision Screening - Comments:: Glasses. [place]. 2022.  Dietary issues and exercise activities  discussed: Current Exercise Habits: Home exercise routine, Type of exercise: walking;Other - see comments (Stationary bike and regular bike.), Time (Minutes): 30, Frequency (Times/Week): 5, Weekly Exercise (Minutes/Week): 150, Intensity: Mild, Exercise limited by: cardiac condition(s)   Goals Addressed   None    Depression Screen PHQ 2/9 Scores 04/28/2021 02/11/2021 07/23/2020 04/16/2020 04/16/2020 04/13/2020 04/16/2019  PHQ - 2 Score 0 0 0 0 0 0 0  PHQ- 9 Score - - - - - - -    Fall Risk Fall Risk  04/28/2021 02/11/2021 05/26/2020 04/16/2020 04/13/2020  Falls in the past year? 1  1 0 0 0  Number falls in past yr: 0 0 - 0 -  Injury with Fall? 0 0 - 0 -  Risk for fall due to : Impaired balance/gait - - No Fall Risks -  Follow up Falls prevention discussed - Falls evaluation completed Falls evaluation completed -    FALL RISK PREVENTION PERTAINING TO THE HOME:  Any stairs in or around the home? Yes  If so, are there any without handrails? No  Home free of loose throw rugs in walkways, pet beds, electrical cords, etc? Yes  Adequate lighting in your home to reduce risk of falls? Yes   ASSISTIVE DEVICES UTILIZED TO PREVENT FALLS:  Life alert? No  Use of a cane, walker or w/c? No  Grab bars in the bathroom? Yes  Shower chair or bench in shower? Yes  Elevated toilet seat or a handicapped toilet? Yes   TIMED UP AND GO:  Was the test performed? Yes .  Length of time to ambulate 10 feet: 12 sec.   Gait steady and fast without use of assistive device  Cognitive Function:     6CIT Screen 04/28/2021 04/16/2020 04/16/2019  What Year? 0 points 0 points 0 points  What month? 0 points 0 points 0 points  What time? 0 points 0 points 0 points  Count back from 20 0 points 0 points 0 points  Months in reverse 0 points 0 points 0 points  Repeat phrase 0 points 0 points 0 points  Total Score 0 0 0    Immunizations Immunization History  Administered Date(s) Administered   Fluad Quad(high Dose  65+) 04/10/2019, 04/13/2020, 04/20/2021   Influenza Split 06/22/2011   Influenza Whole 05/18/2006, 04/30/2007, 04/14/2008   Influenza,inj,Quad PF,6+ Mos 05/31/2013, 05/28/2014, 04/14/2015, 04/27/2016, 05/16/2017, 04/13/2018   Moderna Sars-Covid-2 Vaccination 08/23/2019, 09/20/2019, 05/23/2020, 10/30/2020   Pneumococcal Conjugate-13 09/14/2018   Td 08/13/2009   Zoster Recombinat (Shingrix) 03/30/2021   Zoster, Live 03/23/2011    TDAP status: Due, Education has been provided regarding the importance of this vaccine. Advised may receive this vaccine at local pharmacy or Health Dept. Aware to provide a copy of the vaccination record if obtained from local pharmacy or Health Dept. Verbalized acceptance and understanding.  Flu Vaccine status: Up to date  Pneumococcal vaccine status: Due, Education has been provided regarding the importance of this vaccine. Advised may receive this vaccine at local pharmacy or Health Dept. Aware to provide a copy of the vaccination record if obtained from local pharmacy or Health Dept. Verbalized acceptance and understanding.  Covid-19 vaccine status: Completed vaccines  Qualifies for Shingles Vaccine? Yes   Zostavax completed Yes   Shingrix Completed?: No.    Education has been provided regarding the importance of this vaccine. Patient has been advised to call insurance company to determine out of pocket expense if they have not yet received this vaccine. Advised may also receive vaccine at local pharmacy or Health Dept. Verbalized acceptance and understanding.  Screening Tests Health Maintenance  Topic Date Due   TETANUS/TDAP  08/14/2019   Pneumonia Vaccine 80+ Years old (2 - PPSV23 or PCV20) 09/14/2019   Zoster Vaccines- Shingrix (2 of 2) 05/25/2021   MAMMOGRAM  06/29/2022   COLONOSCOPY (Pts 45-46yrs Insurance coverage will need to be confirmed)  03/30/2026   INFLUENZA VACCINE  Completed   DEXA SCAN  Completed   COVID-19 Vaccine  Completed   Hepatitis C  Screening  Completed   HPV VACCINES  Aged Out    Health  Maintenance  Health Maintenance Due  Topic Date Due   TETANUS/TDAP  08/14/2019   Pneumonia Vaccine 92+ Years old (2 - PPSV23 or PCV20) 09/14/2019    Colorectal cancer screening: Type of screening: Colonoscopy. Completed 03/30/2016. Repeat every 10 years  Mammogram status: Completed 06/29/2020. Repeat every year  Bone Density status: Completed 05/23/2019. Results reflect: Bone density results: OSTEOPENIA. Repeat every 2 years.  Lung Cancer Screening: (Low Dose CT Chest recommended if Age 39-80 years, 30 pack-year currently smoking OR have quit w/in 15years.) does not qualify.     Additional Screening:  Hepatitis C Screening: does qualify; Completed 10/11/2016 Vision Screening: Recommended annual ophthalmology exams for early detection of glaucoma and other disorders of the eye. Is the patient up to date with their annual eye exam?  Yes  Who is the provider or what is the name of the office in which the patient attends annual eye exams? [facility] If pt is not established with a provider, would they like to be referred to a provider to establish care? No .   Dental Screening: Recommended annual dental exams for proper oral hygiene  Community Resource Referral / Chronic Care Management: CRR required this visit?  No   CCM required this visit?  No      Plan:     I have personally reviewed and noted the following in the patient's chart:   Medical and social history Use of alcohol, tobacco or illicit drugs  Current medications and supplements including opioid prescriptions.  Functional ability and status Nutritional status Physical activity Advanced directives List of other physicians Hospitalizations, surgeries, and ER visits in previous 12 months Vitals Screenings to include cognitive, depression, and falls Referrals and appointments  In addition, I have reviewed and discussed with patient certain  preventive protocols, quality metrics, and best practice recommendations. A written personalized care plan for preventive services as well as general preventive health recommendations were provided to patient.     Chriss Driver, LPN   [id number]   Nurse Notes: Pt states she is doing well. 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 First Shingrix done 03/30/2021. Mammogram scheduled for 07/07/2021. Pt states she did a Cologuard earlier in this year. Declined Bone Density at this time.

## 2021-04-28 NOTE — Patient Instructions (Signed)
Julie May , Thank you for taking time to come for your Medicare Wellness Visit. I appreciate your ongoing commitment to your health goals. Please review the following plan we discussed and let me know if I can assist you in the future.   Screening recommendations/referrals: Colonoscopy: Done 03/30/2016, Cologuard done 2022 Mammogram: Done 06/29/2020. Ordered for 07/07/2021 Bone Density: Done 05/23/2019, Osteopenia Recommended yearly ophthalmology/optometry visit for glaucoma screening and checkup Recommended yearly dental visit for hygiene and checkup  Vaccinations: Influenza vaccine: Done 04/20/2021. Repeat annually  Pneumococcal vaccine: Done 09/14/2018 Tdap vaccine: Done 08/13/2009 Repeat in 10 years  Shingles vaccine: Done 03/30/2021, 2nd does 05/31/2021   Covid-19:Done 08/23/2019, 09/20/2019, 05/23/2020  Advanced directives: Advance directive discussed with you today. Even though you declined this today, please call our office should you change your mind, and we can give you the proper paperwork for you to fill out.   Conditions/risks identified: KEEP UP THE GOOD WORK!!!  Next appointment: Follow up in one year for your annual wellness visit 2023.   Preventive Care 17 Years and Older, Female Preventive care refers to lifestyle choices and visits with your health care provider that can promote health and wellness. What does preventive care include? A yearly physical exam. This is also called an annual well check. Dental exams once or twice a year. Routine eye exams. Ask your health care provider how often you should have your eyes checked. Personal lifestyle choices, including: Daily care of your teeth and gums. Regular physical activity. Eating a healthy diet. Avoiding tobacco and drug use. Limiting alcohol use. Practicing safe sex. Taking low-dose aspirin every day. Taking vitamin and mineral supplements as recommended by your health care provider. What happens during an annual  well check? The services and screenings done by your health care provider during your annual well check will depend on your age, overall health, lifestyle risk factors, and family history of disease. Counseling  Your health care provider may ask you questions about your: Alcohol use. Tobacco use. Drug use. Emotional well-being. Home and relationship well-being. Sexual activity. Eating habits. History of falls. Memory and ability to understand (cognition). Work and work Statistician. Reproductive health. Screening  You may have the following tests or measurements: Height, weight, and BMI. Blood pressure. Lipid and cholesterol levels. These may be checked every 5 years, or more frequently if you are over 23 years old. Skin check. Lung cancer screening. You may have this screening every year starting at age 73 if you have a 30-pack-year history of smoking and currently smoke or have quit within the past 15 years. Fecal occult blood test (FOBT) of the stool. You may have this test every year starting at age 58. Flexible sigmoidoscopy or colonoscopy. You may have a sigmoidoscopy every 5 years or a colonoscopy every 10 years starting at age 11. Hepatitis C blood test. Hepatitis B blood test. Sexually transmitted disease (STD) testing. Diabetes screening. This is done by checking your blood sugar (glucose) after you have not eaten for a while (fasting). You may have this done every 1-3 years. Bone density scan. This is done to screen for osteoporosis. You may have this done starting at age 40. Mammogram. This may be done every 1-2 years. Talk to your health care provider about how often you should have regular mammograms. Talk with your health care provider about your test results, treatment options, and if necessary, the need for more tests. Vaccines  Your health care provider may recommend certain vaccines, such as: Influenza vaccine.  This is recommended every year. Tetanus, diphtheria,  and acellular pertussis (Tdap, Td) vaccine. You may need a Td booster every 10 years. Zoster vaccine. You may need this after age 26. Pneumococcal 13-valent conjugate (PCV13) vaccine. One dose is recommended after age 48. Pneumococcal polysaccharide (PPSV23) vaccine. One dose is recommended after age 23. Talk to your health care provider about which screenings and vaccines you need and how often you need them. This information is not intended to replace advice given to you by your health care provider. Make sure you discuss any questions you have with your health care provider. Document Released: 07/24/2015 Document Revised: 03/16/2016 Document Reviewed: 04/28/2015 Elsevier Interactive Patient Education  2017 [place] Prevention in the Home Falls can cause injuries. They can happen to people of all ages. There are many things you can do to make your home safe and to help prevent falls. What can I do on the outside of my home? Regularly fix the edges of walkways and driveways and fix any cracks. Remove anything that might make you trip as you walk through a door, such as a raised step or threshold. Trim any bushes or trees on the path to your home. Use bright outdoor lighting. Clear any walking paths of anything that might make someone trip, such as rocks or tools. Regularly check to see if handrails are loose or broken. Make sure that both sides of any steps have handrails. Any raised decks and porches should have guardrails on the edges. Have any leaves, snow, or ice cleared regularly. Use sand or salt on walking paths during winter. Clean up any spills in your garage right away. This includes oil or grease spills. What can I do in the bathroom? Use night lights. Install grab bars by the toilet and in the tub and shower. Do not use towel bars as grab bars. Use non-skid mats or decals in the tub or shower. If you need to sit down in the shower, use a plastic, non-slip  stool. Keep the floor dry. Clean up any water that spills on the floor as soon as it happens. Remove soap buildup in the tub or shower regularly. Attach bath mats securely with double-sided non-slip rug tape. Do not have throw rugs and other things on the floor that can make you trip. What can I do in the bedroom? Use night lights. Make sure that you have a light by your bed that is easy to reach. Do not use any sheets or blankets that are too big for your bed. They should not hang down onto the floor. Have a firm chair that has side arms. You can use this for support while you get dressed. Do not have throw rugs and other things on the floor that can make you trip. What can I do in the kitchen? Clean up any spills right away. Avoid walking on wet floors. Keep items that you use a lot in easy-to-reach places. If you need to reach something above you, use a strong step stool that has a grab bar. Keep electrical cords out of the way. Do not use floor polish or wax that makes floors slippery. If you must use wax, use non-skid floor wax. Do not have throw rugs and other things on the floor that can make you trip. What can I do with my stairs? Do not leave any items on the stairs. Make sure that there are handrails on both sides of the stairs and use them. Fix handrails  that are broken or loose. Make sure that handrails are as long as the stairways. Check any carpeting to make sure that it is firmly attached to the stairs. Fix any carpet that is loose or worn. Avoid having throw rugs at the top or bottom of the stairs. If you do have throw rugs, attach them to the floor with carpet tape. Make sure that you have a light switch at the top of the stairs and the bottom of the stairs. If you do not have them, ask someone to add them for you. What else can I do to help prevent falls? Wear shoes that: Do not have high heels. Have rubber bottoms. Are comfortable and fit you well. Are closed at the  toe. Do not wear sandals. If you use a stepladder: Make sure that it is fully opened. Do not climb a closed stepladder. Make sure that both sides of the stepladder are locked into place. Ask someone to hold it for you, if possible. Clearly mark and make sure that you can see: Any grab bars or handrails. First and last steps. Where the edge of each step is. Use tools that help you move around (mobility aids) if they are needed. These include: Canes. Walkers. Scooters. Crutches. Turn on the lights when you go into a dark area. Replace any light bulbs as soon as they burn out. Set up your furniture so you have a clear path. Avoid moving your furniture around. If any of your floors are uneven, fix them. If there are any pets around you, be aware of where they are. Review your medicines with your doctor. Some medicines can make you feel dizzy. This can increase your chance of falling. Ask your doctor what other things that you can do to help prevent falls. This information is not intended to replace advice given to you by your health care provider. Make sure you discuss any questions you have with your health care provider. Document Released: 04/23/2009 Document Revised: 12/03/2015 Document Reviewed: 08/01/2014 Elsevier Interactive Patient Education  2017 [employer].

## 2021-04-28 NOTE — Telephone Encounter (Signed)
Pt here for AWV visit. 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 Pt asks if it is possible to have a referral for PT, for ankle strengthening and balance training? Pt would like to go to [facility] office, if possible. Thank you!

## 2021-04-29 ENCOUNTER — Other Ambulatory Visit: Payer: Self-pay

## 2021-04-29 DIAGNOSIS — Q667 Congenital pes cavus, unspecified foot: Secondary | ICD-10-CM

## 2021-04-29 DIAGNOSIS — Z23 Encounter for immunization: Secondary | ICD-10-CM | POA: Diagnosis not present

## 2021-04-29 NOTE — Telephone Encounter (Signed)
Referral entered  

## 2021-04-30 ENCOUNTER — Other Ambulatory Visit: Payer: Self-pay

## 2021-04-30 ENCOUNTER — Ambulatory Visit (INDEPENDENT_AMBULATORY_CARE_PROVIDER_SITE_OTHER): Payer: Medicare Other | Admitting: Podiatrist

## 2021-04-30 DIAGNOSIS — Q661 Congenital talipes calcaneovarus, unspecified foot: Secondary | ICD-10-CM

## 2021-04-30 NOTE — Progress Notes (Signed)
Julie May presents for an orthotic check-  she states the orthotics are comfortable but the arch is causing her to roll laterally which is uncomfortable.  She would like to see if the arch can be adjusted.  I will send the orthotics to the lab to have the arch height decreased.  I also recommended adding a small extrinsic valgus post to try and keep her from rolling over the insert.  This can be removed in the lab in our office if she doesn't like the feel of the post.  We will call when orthotics are ready for pick up.

## 2021-05-14 ENCOUNTER — Other Ambulatory Visit: Payer: Self-pay

## 2021-05-14 ENCOUNTER — Ambulatory Visit (INDEPENDENT_AMBULATORY_CARE_PROVIDER_SITE_OTHER): Payer: Medicare Other | Admitting: Podiatrist

## 2021-05-14 ENCOUNTER — Encounter: Payer: Self-pay | Admitting: Podiatrist

## 2021-05-14 DIAGNOSIS — M71371 Other bursal cyst, right ankle and foot: Secondary | ICD-10-CM | POA: Diagnosis not present

## 2021-05-14 DIAGNOSIS — M216X1 Other acquired deformities of right foot: Secondary | ICD-10-CM

## 2021-05-14 DIAGNOSIS — Q661 Congenital talipes calcaneovarus, unspecified foot: Secondary | ICD-10-CM

## 2021-05-14 NOTE — Progress Notes (Signed)
Julie May presents to pick up her adjusted orthotics where a 4 degree valgus wedge and decrease in arch height was done at the lab.  She relates the adjustments are comfortable.  She has a significant cavovarus and supination especially on the right.  I recommended she go to [facility] and see about having a lateral wedge built up on the outside of her sneaker to help with the supination.  She also has pain submet 5 of the right fifth metatarsal head with a palpable bursae noted.  Neurovascular status is intact with pain present where a callus has formed in this area.  Assessment:    1. Cavovarus deformity of foot   2. Other bursal cyst, right ankle and foot   3. Prominent metatarsal head, right    Plan:  orthotics dispensed, recommendation on shoe modification given Patient requested an injection submet 5 right foot.  I agreed and a sterile skin prep was performed with alcohol.  I injected 5mg  kenalog with marcaine plain into the area of the bursae submet 5 right. - she tolerated this well.  She will call if any questions or concerns arise.

## 2021-05-17 ENCOUNTER — Other Ambulatory Visit: Payer: Self-pay

## 2021-05-17 ENCOUNTER — Ambulatory Visit (HOSPITAL_COMMUNITY): Payer: Medicare Other | Attending: Family Medicine

## 2021-05-17 ENCOUNTER — Encounter: Payer: Self-pay | Admitting: Podiatrist

## 2021-05-17 DIAGNOSIS — R29898 Other symptoms and signs involving the musculoskeletal system: Secondary | ICD-10-CM | POA: Insufficient documentation

## 2021-05-17 DIAGNOSIS — R262 Difficulty in walking, not elsewhere classified: Secondary | ICD-10-CM | POA: Insufficient documentation

## 2021-05-17 DIAGNOSIS — Q667 Congenital pes cavus, unspecified foot: Secondary | ICD-10-CM | POA: Insufficient documentation

## 2021-05-17 NOTE — Therapy (Signed)
[place], [place], [address] Phone: [phone]   Fax:  [phone]  Physical Therapy Evaluation  Patient Details  Name: Julie May MRN: [id number] Date of Birth: 1950/12/09 Referring Provider (PT): Fayrene Helper, MD   Encounter Date: 05/17/2021   PT End of Session - 05/17/21 1034     Visit Number 1    Number of Visits 1    Authorization Type Medicare    Progress Note Due on Visit 10    PT Start Time 1033    PT Stop Time 1115    PT Time Calculation (min) 42 min    Activity Tolerance Patient tolerated treatment well    Behavior During Therapy [facility] for tasks assessed/performed             Past Medical History:  Diagnosis Date   Bilateral impacted cerumen 04/10/2019   Callus of foot 11/07/2017   Foot pain, bilateral 05/23/2013   Hyperlipidemia    Hyperpotassemia    Hypertension    new dx 11/12/2010   Hypothyroidism    OA (osteoarthritis)    Other abnormal glucose    Overweight(278.02)    Polycythemia, secondary    Routine general medical examination at a health care facility    THYROID NODULE 01/18/2010   Qualifier: Diagnosis of  By: Loanne Drilling MD, Sean A    Trigger finger (acquired)    Unspecified disorder of thyroid    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 Unice Cobble Spine   TONSILECTOMY, ADENOIDECTOMY, BILATERAL MYRINGOTOMY AND TUBES     TONSILLECTOMY     uterine polyp removal       There were no vitals filed for this visit.    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 Pt recently acquired new shoe inserts and is still accomodating to them. Main issue is  with callous under 5th met head and balance/walking issues    Patient Stated Goals work on balance and decrease pain from callous under 5th met head    Currently in Pain? Yes    Pain Score 5     Pain Location Foot    Pain Orientation Right    Pain Type Chronic pain    Effect of Pain on Daily Activities callous pain on left foot limits walking tolerance                [facility] PT Assessment - 05/17/21 0001       Assessment   Medical Diagnosis Cavus deformity of foo    Referring Provider (PT) Fayrene Helper, MD      Balance Screen   Has the patient fallen in the past 6 months No    Has the patient had a decrease in activity level because of a fear of falling?  No    Is the patient reluctant to leave their home because of a fear of falling?  No      Home Ecologist residence      Prior Function   Level of Independence Independent    Vocation Retired    Leisure exercise, cycling  Observation/Other Assessments   Observations rigid foot deformities, bilateral pes cavus deformity. More rear foot than forefoot for rigidity. Achilles tightness bilaterally      Functional Tests   Functional tests Single leg stance      Single Leg Stance   Comments 1-2 sec      ROM / Strength   AROM / PROM / Strength AROM;PROM      AROM   Overall AROM Comments limited ankle dorsiflexion due to foot deformity and soft tissue tightness/restrictions      Ambulation/Gait   Ambulation/Gait Yes    Ambulation/Gait Assistance 7: Independent                        Objective measurements completed on examination: See above findings.       [place] Adult PT Treatment/Exercise - 05/17/21 0001       Exercises   Exercises Ankle      Ankle Exercises: Stretches   Slant Board Stretch 5 reps;60 seconds                     PT Education - 05/17/21 1123     Education Details pt educated on rationale of static stretching and possible  use of JAS EZ ankle brace if MD thinks it would be worthwhile to improve ankle dorsiflexion    Person(s) Educated Patient    Methods Explanation;Demonstration    Comprehension Verbalized understanding              PT Short Term Goals - 05/17/21 1127       PT SHORT TERM GOAL #1   Title Patient will be independent in self management strategies to improve quality of life and functional outcomes.    Baseline Independent with gastroc stretching after evaluation    Time 1    Period Days    Status Achieved                       Plan - 05/17/21 1125     Clinical Impression Statement Patient with bilateral foot deformities since birth with bilateral pes cavus and somewhat rigid 1st ray.  Recommend course of gastroc/soleus stretching to improve dorsiflexion ROM to hopefully allieviate some of her forefoot pressures. Recommend a few months of stretching/splinting if possible to improve ROM    Personal Factors and Comorbidities Time since onset of injury/illness/exacerbation    Examination-Activity Limitations Locomotion Level    Stability/Clinical Decision Making Stable/Uncomplicated    Clinical Decision Making Low    PT Frequency One time visit    PT Treatment/Interventions Therapeutic exercise             Patient will benefit from skilled therapeutic intervention in order to improve the following deficits and impairments:  Abnormal gait, Impaired flexibility, Hypomobility  Visit Diagnosis: Difficulty in walking, not elsewhere classified  Other symptoms and signs involving the musculoskeletal system     Problem List Patient Active Problem List   Diagnosis Date Noted   Body mass index (BMI) 28.0-28.9, adult 04/01/2020   Spondylolisthesis of lumbar region 01/28/2020   Elevated blood-pressure reading, without diagnosis of hypertension 01/20/2020   Callus of foot 11/07/2017   Prediabetes 08/15/2017   Vitamin D deficiency 05/18/2017   Nontoxic multinodular  goiter 09/25/2015   Osteopenia 04/04/2015   Elevated blood pressure reading 07/13/2014   Right foot pain 05/23/2013   Essential hypertension, benign 11/12/2010   Hypothyroidism 01/18/2010   Mixed hyperlipidemia 12/17/2009  Overweight 09/13/2007    Toniann Fail, PT 05/17/2021, 11:29 AM  [place], [place], [address] Phone: [phone]   Fax:  [phone]  Name: Julie May MRN: [id number] Date of Birth: [birth year]

## 2021-05-19 ENCOUNTER — Ambulatory Visit (HOSPITAL_COMMUNITY)
Admission: RE | Admit: 2021-05-19 | Discharge: 2021-05-19 | Disposition: A | Discharge status: Home / Self Care | Payer: Medicare Other | Source: Ambulatory Visit | Attending: "Endocrinology | Admitting: "Endocrinology

## 2021-05-19 ENCOUNTER — Other Ambulatory Visit: Payer: Self-pay

## 2021-05-19 DIAGNOSIS — E039 Hypothyroidism, unspecified: Secondary | ICD-10-CM | POA: Diagnosis not present

## 2021-05-19 DIAGNOSIS — E038 Other specified hypothyroidism: Secondary | ICD-10-CM | POA: Insufficient documentation

## 2021-05-19 DIAGNOSIS — E042 Nontoxic multinodular goiter: Secondary | ICD-10-CM | POA: Diagnosis not present

## 2021-05-23 ENCOUNTER — Encounter: Payer: Self-pay | Admitting: Podiatry

## 2021-05-24 NOTE — Telephone Encounter (Signed)
Please advise 

## 2021-05-31 ENCOUNTER — Encounter: Payer: Self-pay | Admitting: Podiatry

## 2021-05-31 ENCOUNTER — Ambulatory Visit: Payer: Medicare Other

## 2021-05-31 ENCOUNTER — Other Ambulatory Visit: Payer: Self-pay

## 2021-05-31 DIAGNOSIS — Q661 Congenital talipes calcaneovarus, unspecified foot: Secondary | ICD-10-CM

## 2021-05-31 NOTE — Progress Notes (Signed)
SITUATION Reason for Consult: Follow-up with Bilateral custom foot orthoses Patient / Caregiver Report: Patient reports feeling as if she is tipping forward  OBJECTIVE DATA History / Diagnosis: Cavovarus deformity of foot Change in Pathology: No change  ACTIONS PERFORMED Patient's equipment was checked for structural stability and fit. Reduced heel height by 1/4" to match patient's typical 3/8" heel height sneakers. Device(s) intact and fit is excellent. All questions answered and concerns addressed.  PLAN Follow-up as needed (PRN). Plan of care discussed with and agreed upon by patient / caregiver.

## 2021-06-01 NOTE — Telephone Encounter (Signed)
Please call patient and set up an appt with me. Thanks, Dr. Amalia Hailey

## 2021-06-02 NOTE — Telephone Encounter (Signed)
Please contact patient to schedule appointment.

## 2021-06-11 NOTE — Telephone Encounter (Signed)
Patient scheduled.

## 2021-06-13 ENCOUNTER — Other Ambulatory Visit: Payer: Self-pay | Admitting: "Endocrinology

## 2021-06-14 ENCOUNTER — Other Ambulatory Visit: Payer: Self-pay

## 2021-06-14 ENCOUNTER — Ambulatory Visit (INDEPENDENT_AMBULATORY_CARE_PROVIDER_SITE_OTHER): Payer: Medicare Other | Admitting: Podiatry

## 2021-06-14 DIAGNOSIS — M216X1 Other acquired deformities of right foot: Secondary | ICD-10-CM | POA: Diagnosis not present

## 2021-06-14 DIAGNOSIS — Q661 Congenital talipes calcaneovarus, unspecified foot: Secondary | ICD-10-CM

## 2021-06-14 NOTE — Progress Notes (Signed)
HPI: 70 y.o. female presenting today for follow-up evaluation of bilateral foot pain this been going on for several years.  Most recently she has had an increase in right foot pain due to a callus that is developed to the plantar aspect of the foot.  Patient states that she has seen multiple podiatrist in the past as well as chiropractors to evaluate her feet.  She has chronic pain that is been ongoing for several years.  She has tried both over-the-counter and prescription strength orthotics.    Last visit on 02/17/2021 the patient was molded for custom molded orthotics.  She says the orthotics were great except the arches were too tight.  They have since been adjusted multiple times.  She is requesting the orthotics in the original form with the lower arch height.  She presents for further treatment and evaluation  Past Medical History:  Diagnosis Date   Bilateral impacted cerumen 04/10/2019   Callus of foot 11/07/2017   Foot pain, bilateral 05/23/2013   Hyperlipidemia    Hyperpotassemia    Hypertension    new dx 11/12/2010   Hypothyroidism    OA (osteoarthritis)    Other abnormal glucose    Overweight(278.02)    Polycythemia, secondary    Routine general medical examination at a health care facility    THYROID NODULE 01/18/2010   Qualifier: Diagnosis of  By: Loanne Drilling MD, Sean A    Trigger finger (acquired)    Unspecified disorder of thyroid    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  Dermatology: Skin is warm, dry and supple bilateral lower extremities. Negative for open lesions or macerations.  Hyperkeratotic preulcerative callus lesion also noted to the subphase MTPJ of the right foot with associated tenderness to palpation  Vascular: Palpable pedal pulses bilaterally. No edema or erythema noted. Capillary refill within normal limits.  Neurological: Epicritic and protective threshold grossly intact  bilaterally.   Musculoskeletal Exam: Cavovarus foot deformity noted bilateral with hammertoe contracture digits 1-5 bilateral  Radiographic Exam 02/17/2021:  Normal osseous mineralization.  Degenerative changes noted throughout the foot consistent with osteoarthritis.  Increased calcaneal inclination angle with a metatarsal declination angle consistent with high arch foot and cavovarus foot deformity.  Hammertoe contracture also noted 1-5 bilateral  Assessment: 1.  Cavovarus foot type bilateral 2.  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  Chronic pain bilateral feet 4.  Preulcerative callus lesions of fifth MTPJ right   Plan of Care:  1. Patient evaluated.  2.  Patient states that the original orthotics she ordered were perfect and she really enjoyed the orthotics however the arch was too high.  The order was placed to have the arch.  Today we are going to reorder the exact same orthotics and lower the arch. 3.  Orthotics were taken today and given to the orthotics department, Timmothy Sours.  Message was sent to our orthotics department regarding the issue. 4.  Return to clinic for orthotics pickup indispensable with Pedorthist     Edrick Kins, DPM [facility]  Dr. Edrick Kins, DPM    [address], [place] [address]  Office [phone]  Fax [phone]

## 2021-06-21 DIAGNOSIS — E038 Other specified hypothyroidism: Secondary | ICD-10-CM | POA: Diagnosis not present

## 2021-06-21 DIAGNOSIS — E782 Mixed hyperlipidemia: Secondary | ICD-10-CM | POA: Diagnosis not present

## 2021-06-21 IMAGING — US US EXTREM LOW*L* LIMITED
1 series · 13 of 13 positions shown · non-contrast
Comparison: None.

CLINICAL DATA: Right thigh mass

EXAM:
ULTRASOUND RIGHT LOWER EXTREMITY LIMITED
TECHNIQUE: Ultrasound examination of the lower extremity soft tissues was
performed in the area of clinical concern.

[Series 1: us extrem low*left* limited · 0.08mm/px · 13 of 13 slices shown]
[im 1/13]
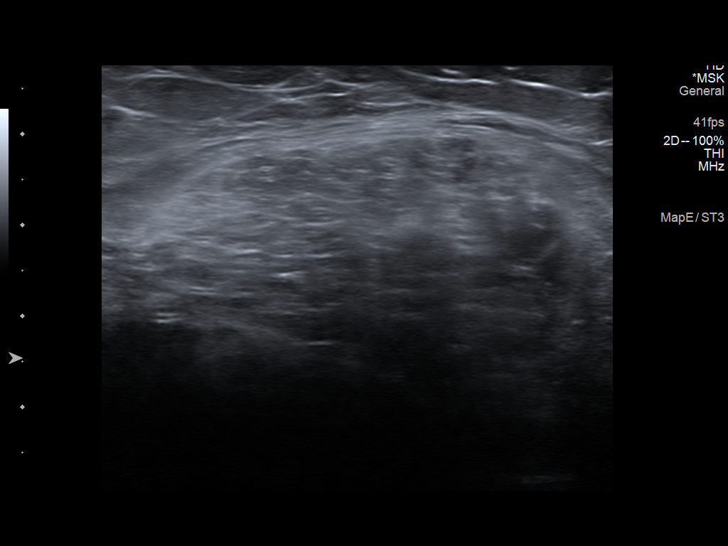
[im 2/13]
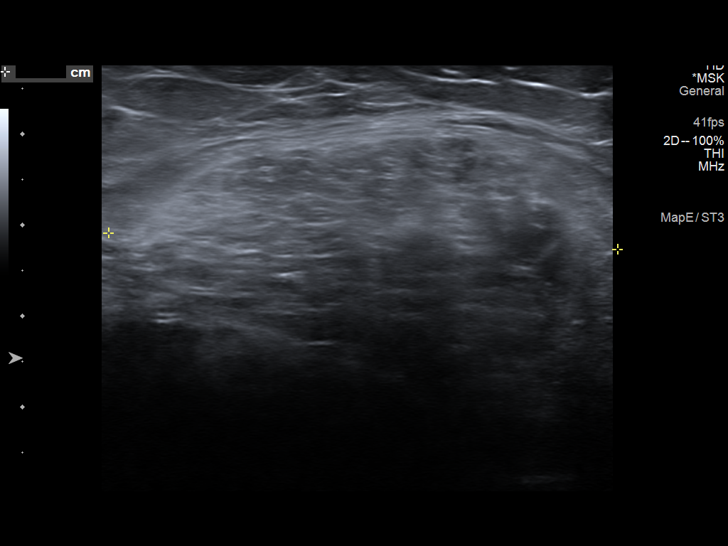
[im 3/13]
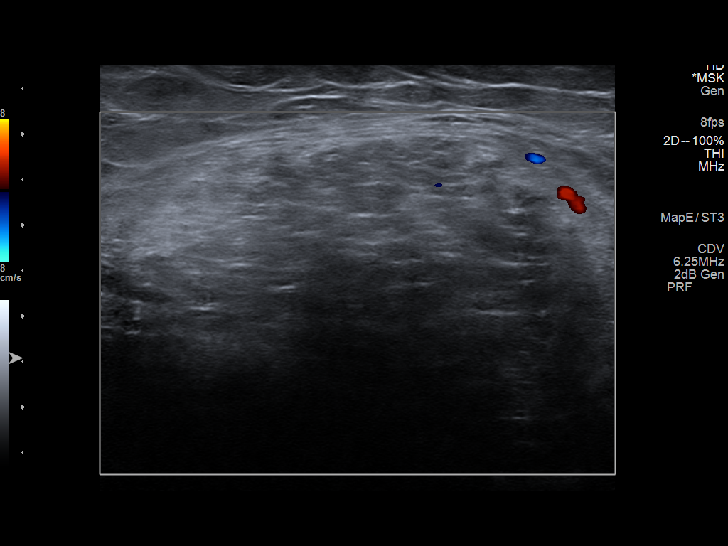
[im 4/13]
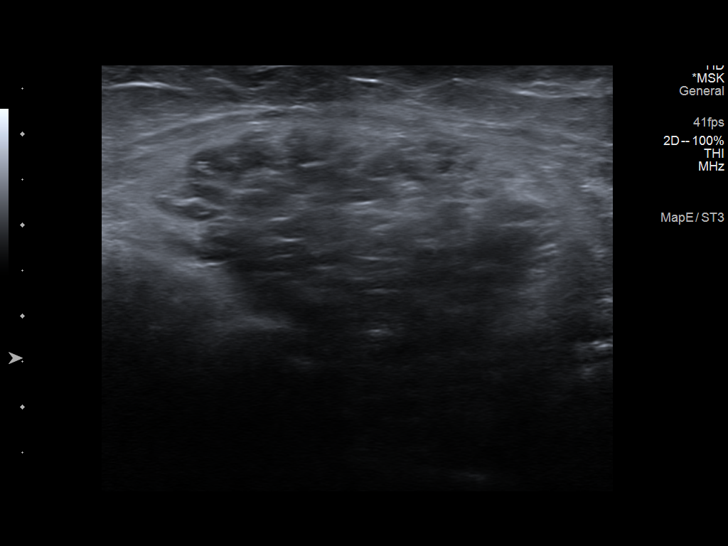
[im 5/13]
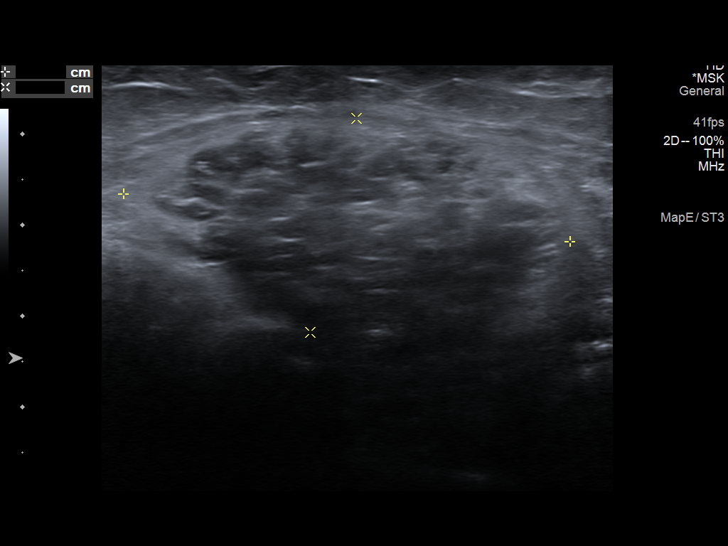
[im 6/13]
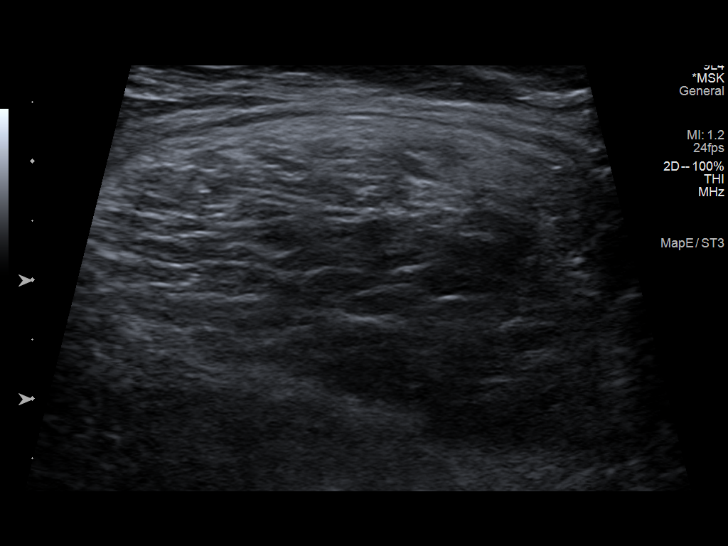
[im 7/13]
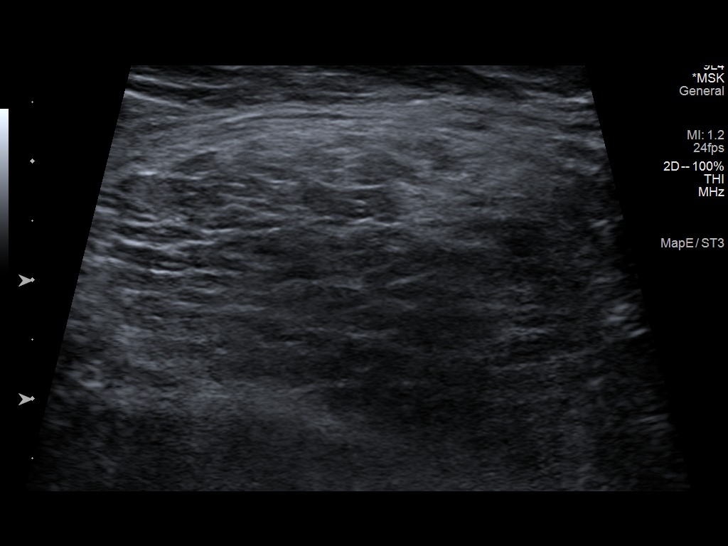
[im 8/13]
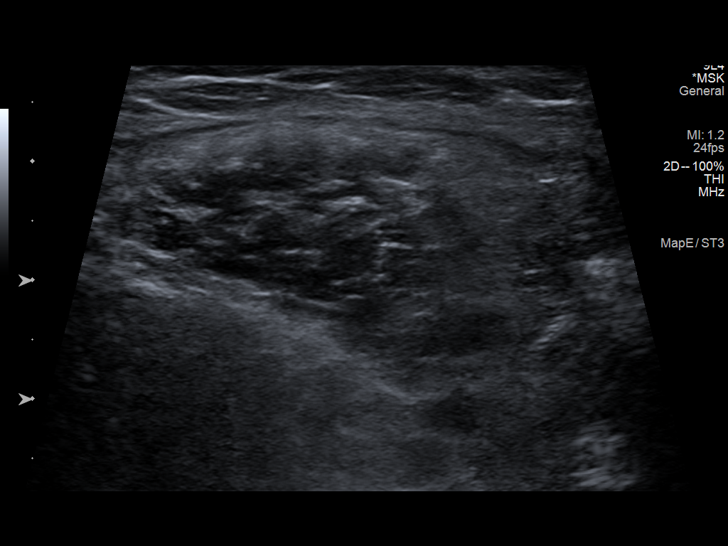
[im 9/13]
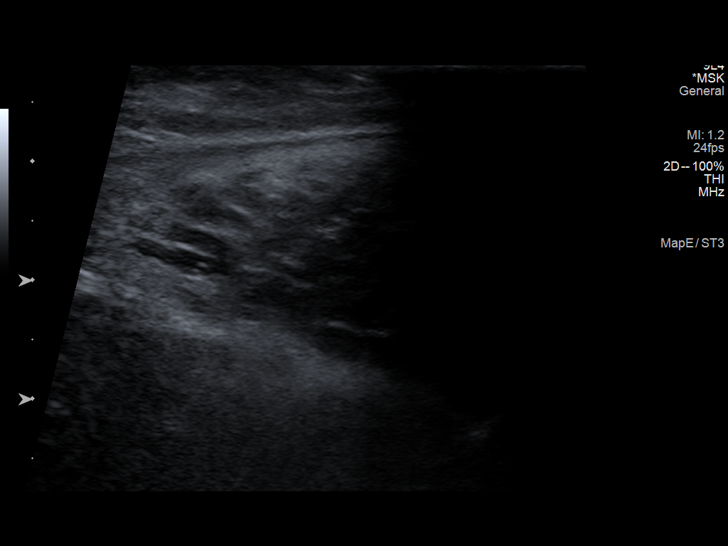
[im 10/13]
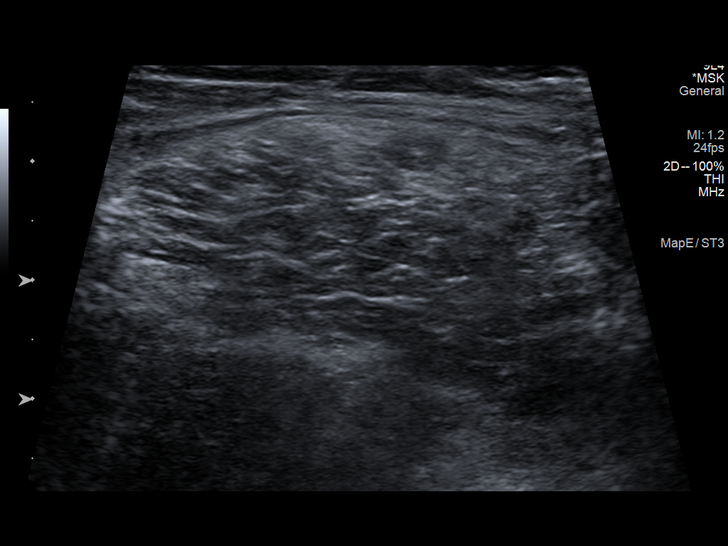
[im 11/13]
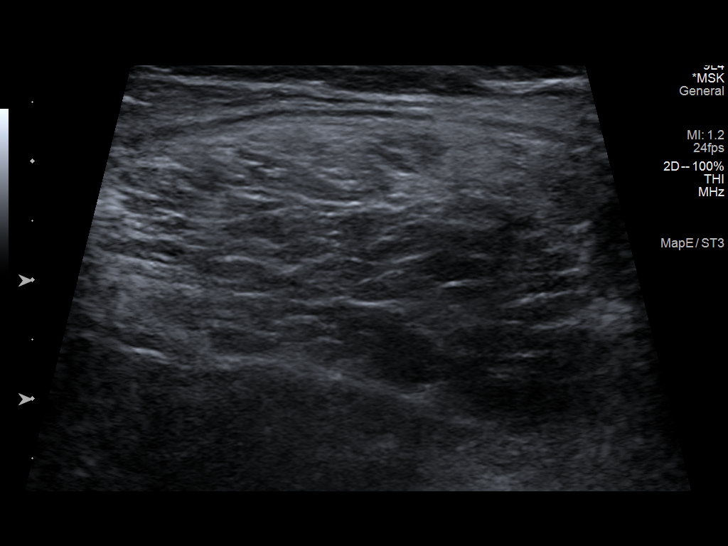
[im 12/13]
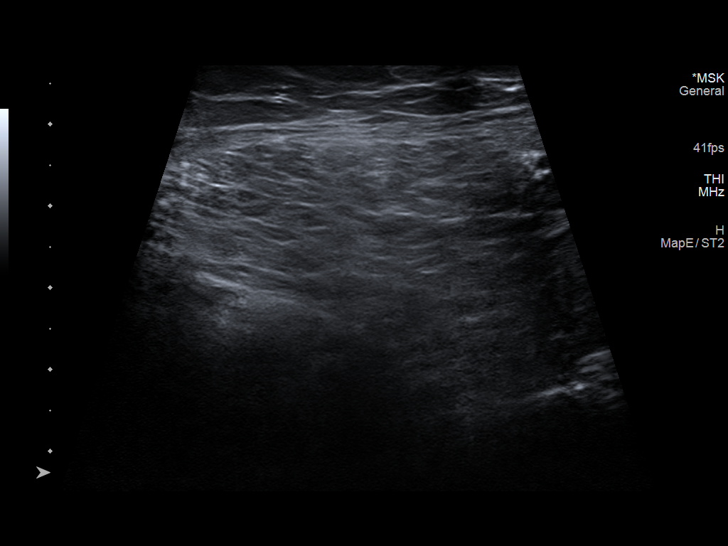
[im 13/13]
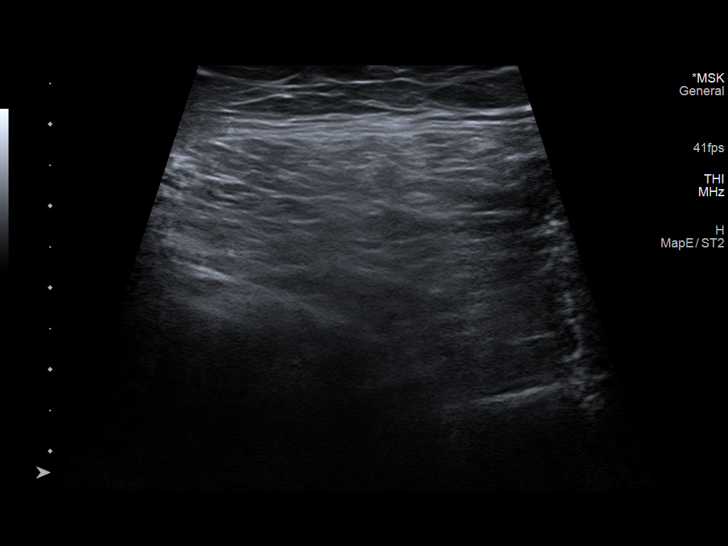

[13 of 13 positions shown; findings below may reference images not displayed]

FINDINGS: Within the area of concern in the right lateral thigh, there is a
5.6 x 4.9 x 2.4 cm mixed echogenicity solid mass. This may reflect
no cystic components.
IMPRESSION: Solid heterogeneous mass of mixed echogenicity within the lateral
right thigh. This may reflect lipoma, but consider further
characterization with CT or MRI.

## 2021-06-24 LAB — LIPID PANEL
Chol/HDL Ratio: 2.5 ratio (ref 0.0–4.4)
Cholesterol, Total: 198 mg/dL (ref 100–199)
HDL: 78 mg/dL (ref >39)
LDL Chol Calc (NIH): 112 mg/dL — ABNORMAL HIGH (ref 0–99)
Triglycerides: 44 mg/dL (ref 0–149)
VLDL Cholesterol Cal: 8 mg/dL (ref 5–40)

## 2021-06-24 LAB — T4, FREE: Free T4: 1.66 ng/dL (ref 0.82–1.77)

## 2021-06-24 LAB — TSH: TSH: 3.05 u[IU]/mL (ref 0.450–4.500)

## 2021-06-25 ENCOUNTER — Encounter: Payer: Self-pay | Admitting: "Endocrinology

## 2021-06-25 ENCOUNTER — Ambulatory Visit (INDEPENDENT_AMBULATORY_CARE_PROVIDER_SITE_OTHER): Payer: Medicare Other | Admitting: "Endocrinology

## 2021-06-25 VITALS — BP 134/78 | HR 80 | Ht 65.0 in | Wt 153.8 lb

## 2021-06-25 DIAGNOSIS — R7303 Prediabetes: Secondary | ICD-10-CM

## 2021-06-25 DIAGNOSIS — E038 Other specified hypothyroidism: Secondary | ICD-10-CM

## 2021-06-25 DIAGNOSIS — E782 Mixed hyperlipidemia: Secondary | ICD-10-CM | POA: Diagnosis not present

## 2021-06-25 LAB — POCT GLYCOSYLATED HEMOGLOBIN (HGB A1C): HbA1c, POC (prediabetic range): 5.5 % — AB (ref 5.7–6.4)

## 2021-06-25 NOTE — Progress Notes (Signed)
06/25/2021        Endocrinology Follow Up Visit   Subjective:    Patient ID: Julie May, female    DOB: 1950-10-03, PCP Fayrene Helper, MD   Past Medical History:  Diagnosis Date   Bilateral impacted cerumen 04/10/2019   Callus of foot 11/07/2017   Foot pain, bilateral 05/23/2013   Hyperlipidemia    Hyperpotassemia    Hypertension    new dx 11/12/2010   Hypothyroidism    OA (osteoarthritis)    Other abnormal glucose    Overweight(278.02)    Polycythemia, secondary    Routine general medical examination at a health care facility    THYROID NODULE 01/18/2010   Qualifier: Diagnosis of  By: Loanne Drilling MD, Sean A    Trigger finger (acquired)    Unspecified disorder of thyroid    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 Unice Cobble Spine   TONSILECTOMY, ADENOIDECTOMY, BILATERAL MYRINGOTOMY AND TUBES     TONSILLECTOMY     uterine polyp removal      Social History   Socioeconomic History   Marital status: Married    Spouse name: [name]   Number of children: 0   Years of education: Not on file   Highest education level: Not on file  Occupational History   Occupation: Clinical cytogeneticist   Tobacco Use   Smoking status: Never   Smokeless tobacco: Never  Vaping Use   Vaping Use: Never used  Substance and Sexual Activity   Alcohol use: Yes    Comment: occ.   Drug use: No   Sexual activity: Yes    Birth control/protection: Post-menopausal  Other Topics Concern   Not on file  Social History Narrative   Married x 36 years 2022.   Social Determinants of Health   Financial Resource Strain: Low Risk    Difficulty of Paying Living Expenses: Not hard at all  Food Insecurity: No Food Insecurity   Worried About Charity fundraiser in the  Last Year: Never true   [place] in the Last Year: Never true  Transportation Needs: No Transportation Needs   Lack of Transportation (Medical): No   Lack of Transportation (Non-Medical): No  Physical Activity: Sufficiently Active   Days of Exercise per Week: 5 days   Minutes of Exercise per Session: 30 min  Stress: No Stress Concern Present   Feeling of Stress : Not at all  Social Connections: Socially Integrated   Frequency of Communication with Friends and Family: More than three times a week   Frequency of Social Gatherings with Friends and Family: More than three times a week   Attends Religious Services: More than 4 times per year   Active Member of [employer] or Organizations: Yes   Attends Archivist Meetings: More than 4 times per year   Marital Status: Married   Outpatient Encounter Medications as of 06/25/2021  Medication Sig   Cholecalciferol (VITAMIN D) 2000 units CAPS Take 2,000 Units by mouth daily with lunch.    losartan (COZAAR) 25 MG tablet TAKE 1 TABLET(25 MG) BY MOUTH DAILY   TIROSINT 75 MCG CAPS TAKE 1 CAPSULE BY MOUTH DAILY BEFORE BREAKFAST.   No facility-administered encounter medications on file as of 06/25/2021.  ALLERGIES: No Known Allergies VACCINATION STATUS: Immunization History  Administered Date(s) Administered   Fluad Quad(high Dose 65+) 04/10/2019, 04/13/2020, 04/20/2021   Influenza Split 06/22/2011   Influenza Whole 05/18/2006, 04/30/2007, 04/14/2008   Influenza,inj,Quad PF,6+ Mos 05/31/2013, 05/28/2014, 04/14/2015, 04/27/2016, 05/16/2017, 04/13/2018   Moderna Sars-Covid-2 Vaccination 08/23/2019, 09/20/2019, 05/23/2020, 10/30/2020   Pneumococcal Conjugate-13 09/14/2018   Td 08/13/2009   Zoster Recombinat (Shingrix) 03/30/2021   Zoster, Live 03/23/2011    Thyroid Problem Presents for follow-up visit. Patient reports no anxiety, cold intolerance, constipation, depressed mood, diarrhea, fatigue, heat intolerance, leg swelling,  palpitations, tremors, weight gain or weight loss. The symptoms have been stable.  Hypertension: New diagnosis.  She is not on treatment. - Review of systems  Constitutional: + Minimally fluctuating body weight.,  current Body mass index is 25.59 kg/m. , no fatigue, no subjective hyperthermia, no subjective hypothermia    Objective:    BP 134/78    Pulse 80    Ht 5\' 5"  (1.651 m)    Wt 153 lb 12.8 oz (69.8 kg)    BMI 25.59 kg/m   Wt Readings from Last 3 Encounters:  06/25/21 153 lb 12.8 oz (69.8 kg)  04/28/21 153 lb (69.4 kg)  02/11/21 158 lb (71.7 kg)    BP Readings from Last 3 Encounters:  06/25/21 134/78  04/28/21 (!) 162/89  02/11/21 134/84    Physical Exam- Limited  Constitutional:  Body mass index is 25.59 kg/m. , not in acute distress, anxious state of mind Eyes:  EOMI, no exophthalmos Neck: Supple, palpable thyroid.  CMP     Component Value Date/Time   NA 134 02/11/2021 1456   K 5.0 02/11/2021 1456   CL 96 02/11/2021 1456   CO2 24 02/11/2021 1456   GLUCOSE 95 02/11/2021 1456   GLUCOSE 102 (H) 05/11/2020 0000   BUN 10 02/11/2021 1456   CREATININE 0.69 02/11/2021 1456   CREATININE 0.80 05/11/2020 0000   CALCIUM 9.4 02/11/2021 1456   PROT 7.3 05/11/2020 0000   ALBUMIN 4.3 04/03/2019 0934   AST 22 05/11/2020 0000   ALT 16 05/11/2020 0000   ALKPHOS 82 04/03/2019 0934   BILITOT 0.5 05/11/2020 0000   GFRNONAA 75 05/11/2020 0000   GFRAA 87 05/11/2020 0000     Diabetic Labs (most recent): Lab Results  Component Value Date   HGBA1C 5.5 (A) 06/25/2021   HGBA1C 5.7 (H) 05/11/2020   HGBA1C 5.8 (H) 04/03/2019      Lipid Panel     Component Value Date/Time   CHOL 198 06/21/2021 0845   TRIG 44 06/21/2021 0845   HDL 78 06/21/2021 0845   CHOLHDL 2.5 06/21/2021 0845   CHOLHDL 2.5 05/11/2020 0000   VLDL 10 04/03/2019 0934   LDLCALC 112 (H) 06/21/2021 0845   LDLCALC 110 (H) 05/11/2020 0000   Recent Results (from the past 2160 hour(s))  Lipid panel      Status: Abnormal   Collection Time: 06/21/21  8:45 AM  Result Value Ref Range   Cholesterol, Total 198 100 - 199 mg/dL   Triglycerides 44 0 - 149 mg/dL   HDL 78 >39 mg/dL   VLDL Cholesterol Cal 8 5 - 40 mg/dL   LDL Chol Calc (NIH) 112 (H) 0 - 99 mg/dL   Chol/HDL Ratio 2.5 0.0 - 4.4 ratio    Comment:  T. Chol/HDL Ratio                                             Men  Women                               1/2 Avg.Risk  3.4    3.3                                   Avg.Risk  5.0    4.4                                2X Avg.Risk  9.6    7.1                                3X Avg.Risk 23.4   11.0   TSH     Status: None   Collection Time: 06/21/21  8:45 AM  Result Value Ref Range   TSH 3.050 0.450 - 4.500 uIU/mL  T4, free     Status: None   Collection Time: 06/21/21  8:45 AM  Result Value Ref Range   Free T4 1.66 0.82 - 1.77 ng/dL  HgB A1c     Status: Abnormal   Collection Time: 06/25/21  9:46 AM  Result Value Ref Range   Hemoglobin A1C     HbA1c POC (<> result, manual entry)     HbA1c, POC (prediabetic range) 5.5 (A) 5.7 - 6.4 %   HbA1c, POC (controlled diabetic range)       Assessment & Plan:   1.  Primary hypothyroidism -Her previsit thyroid function tests are consistent with appropriate thyroid hormone replacement.  She is advised to stay on same dose of Tirosint 75 mcg p.o. daily before breakfast.     - We discussed about the correct intake of her thyroid hormone, on empty stomach at fasting, with water, separated by at least 30 minutes from breakfast and other medications,  and separated by more than 4 hours from calcium, iron, multivitamins, acid reflux medications (PPIs). -Patient is made aware of the fact that thyroid hormone replacement is needed for life, dose to be adjusted by periodic monitoring of thyroid function tests.   -She did not tolerate levothyroxine or Synthroid in the past.    2. Nontoxic multinodular goiter Her last  ultrasound from 2017 was unchanged from previous studies, showing no significant findings.  She will be considered for 1 more thyroid ultrasound for surveillance before her next visit.  3.  Hypertension Patient was observed to have significantly elevated blood pressure on several instances.  She generally dislikes medications she is approached for low-dose losartan and hesitantly accepts.  I prescribed losartan 25 mg p.o. daily with breakfast.  4) Hyperlipidemia: she does not like to take statins. She will benefit from [place].   The following Lifestyle Medicine recommendations according to [facility]) were discussed and and offered to patient and she  agrees to start the journey:  A. Whole Foods, Plant-Based Nutrition comprising of fruits and vegetables, plant-based proteins, whole-grain carbohydrates was discussed in detail with  the patient.   A list for source of those nutrients were also provided to the patient.  Patient will use only water or unsweetened tea for hydration.  B.  A full color page of  Calorie density of various food groups per pound showing examples of each food groups was provided to the patient.   5) Prediabetes- resolved with ac of 5.5%. see above.  - I advised patient to maintain close follow up with Fayrene Helper, MD for primary care needs.   I spent 31 minutes in the care of the patient today including review of labs from Thyroid Function, CMP, and other relevant labs ; imaging/biopsy records (current and previous including abstractions from other facilities); face-to-face time discussing  her lab results and symptoms, medications doses, her options of short and long term treatment based on the latest standards of care / guidelines;   and documenting the encounter.  [name]  participated in the discussions, expressed understanding, and voiced agreement with the above plans.  All questions were answered to her satisfaction.  she is encouraged to contact clinic should she have any questions or concerns prior to her return visit.  Follow up plan: Return in about 6 months (around 12/24/2021) for F/U with Pre-visit Labs.  Rayetta Pigg, [facility] [address] [place], [place] [address] Phone: [phone] Fax: [phone]  06/25/2021, 3:32 PM

## 2021-07-07 ENCOUNTER — Ambulatory Visit (HOSPITAL_COMMUNITY)
Admission: RE | Admit: 2021-07-07 | Discharge: 2021-07-07 | Disposition: A | Discharge status: Home / Self Care | Payer: Medicare Other | Source: Ambulatory Visit | Attending: Family Medicine | Admitting: Family Medicine

## 2021-07-07 ENCOUNTER — Other Ambulatory Visit: Payer: Self-pay

## 2021-07-07 DIAGNOSIS — Z1231 Encounter for screening mammogram for malignant neoplasm of breast: Secondary | ICD-10-CM | POA: Insufficient documentation

## 2021-07-20 ENCOUNTER — Other Ambulatory Visit (HOSPITAL_COMMUNITY): Payer: Self-pay

## 2021-07-22 ENCOUNTER — Other Ambulatory Visit (HOSPITAL_COMMUNITY): Payer: Self-pay

## 2021-07-27 ENCOUNTER — Other Ambulatory Visit: Payer: Self-pay

## 2021-07-27 ENCOUNTER — Ambulatory Visit: Payer: Medicare Other

## 2021-07-27 DIAGNOSIS — Q661 Congenital talipes calcaneovarus, unspecified foot: Secondary | ICD-10-CM

## 2021-07-27 NOTE — Progress Notes (Signed)
SITUATION: Reason for Visit: Fitting and Delivery of Custom Fabricated Foot Orthoses Patient Report: Patient reports comfort and is satisfied with device.  OBJECTIVE DATA: Patient History / Diagnosis:     ICD-10-CM   1. Cavovarus deformity of foot  Q66.10       Provided Device:  Custom functional foot orthotics  GOAL OF ORTHOSIS - Improve gait - Decrease energy expenditure - Improve Balance - Provide Triplanar stability of foot complex - Facilitate motion  ACTIONS PERFORMED Patient was fit with foot orthotics trimmed to shoe last. Patient tolerated fittign procedure. Device was modified as follows to better fit patient: - Toe plate was trimmed to shoe last  Patient was provided with verbal and written instruction and demonstration regarding donning, doffing, wear, care, proper fit, function, purpose, cleaning, and use of the orthosis and in all related precautions and risks and benefits regarding the orthosis.  Patient was also provided with verbal instruction regarding how to report any failures or malfunctions of the orthosis and necessary follow up care. Patient was also instructed to contact our office regarding any change in status that may affect the function of the orthosis.  Patient demonstrated independence with proper donning, doffing, and fit and verbalized understanding of all instructions.  PLAN: Patient is to follow up in one week or as necessary (PRN). All questions were answered and concerns addressed. Plan of care was discussed with and agreed upon by the patient.

## 2021-07-31 ENCOUNTER — Telehealth: Payer: Medicare Other | Admitting: Nurse Practitioner

## 2021-07-31 DIAGNOSIS — U071 COVID-19: Secondary | ICD-10-CM

## 2021-07-31 MED ORDER — MOLNUPIRAVIR EUA 200MG CAPSULE: 4.0000 | ORAL_CAPSULE | Freq: Two times a day (BID) | ORAL | 0 refills | Status: AC

## 2021-07-31 NOTE — Progress Notes (Signed)
Virtual Visit Consent   [name], you are scheduled for a virtual visit with Glendoris-Margaret Hassell Done, FNP, a [facility] provider, today.     Just as with appointments in the office, your consent must be obtained to participate.  Your consent will be active for this visit and any virtual visit you may have with one of our providers in the next 365 days.     If you have a MyChart account, a copy of this consent can be sent to you electronically.  All virtual visits are billed to your insurance company just like a traditional visit in the office.    As this is a virtual visit, video technology does not allow for your provider to perform a traditional examination.  This may limit your provider's ability to fully assess your condition.  If your provider identifies any concerns that need to be evaluated in person or the need to arrange testing (such as labs, EKG, etc.), we will make arrangements to do so.     Although advances in technology are sophisticated, we cannot ensure that it will always work on either your end or our end.  If the connection with a video visit is poor, the visit may have to be switched to a telephone visit.  With either a video or telephone visit, we are not always able to ensure that we have a secure connection.     I need to obtain your verbal consent now.   Are you willing to proceed with your visit today? YES   BRADY PLANT has provided verbal consent on 07/31/2021 for a virtual visit (video or telephone).   Sueellen-Margaret Hassell Done, FNP   Date: 07/31/2021 10:19 AM   Virtual Visit via Video Note   I, Meliss-Margaret Hassell Done, connected with [name] ([id number], [birth year]) on 07/31/21 at 10:30 AM EST by a video-enabled telemedicine application and verified that I am speaking with the correct person using two identifiers.  Location: Patient: Virtual Visit Location Patient: Home Provider: Virtual Visit Location Provider: Mobile   I discussed the limitations of  evaluation and management by telemedicine and the availability of in person appointments. The patient expressed understanding and agreed to proceed.    History of Present Illness: Julie May is a 71 y.o. who identifies as a female who was assigned female at birth, and is being seen today for covid positive.   HPI: URI  This is a new problem. The current episode started yesterday. The problem has been gradually worsening. There has been no fever. Associated symptoms include congestion, coughing, headaches, rhinorrhea and sneezing. Pertinent negatives include no sore throat. She has tried decongestant and acetaminophen for the symptoms. The treatment provided mild relief.  Tested positive for covid this morning. Review of Systems  HENT:  Positive for congestion, rhinorrhea and sneezing. Negative for sore throat.   Respiratory:  Positive for cough.   Neurological:  Positive for headaches.   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 , Disp: , Rfl:    losartan (COZAAR) 25 MG tablet, TAKE 1 TABLET(25 MG) BY MOUTH DAILY, Disp: 90 tablet, Rfl: 0   TIROSINT 75 MCG CAPS, TAKE 1 CAPSULE BY MOUTH DAILY BEFORE BREAKFAST., Disp: 90 capsule, Rfl:  3  Observations/Objective: Patient is well-developed, well-nourished in no acute distress.  Resting comfortably  at home.  Head is normocephalic, atraumatic.  No labored breathing.  Speech is clear and coherent with logical content.  Patient is alert and oriented at baseline.  Raspy voice No cough  Assessment and Plan: [name] in today with chief complaint of Covid Positive   1. Positive self-administered antigen test for COVID-19 1. Take meds as prescribed 2. Use a cool mist humidifier especially during the winter months and when heat has been humid. 3. Use saline nose sprays frequently 4. Saline irrigations of the nose can be very helpful if done frequently.  * 4X daily for 1 week*  * Use of a nettie pot can be helpful with this. Follow directions with this* 5. Drink plenty of fluids 6. Keep thermostat turn down low 7.For any cough or congestion- delsym or mucinex 8. For fever or aces or pains- take tylenol or ibuprofen appropriate for age and weight.  * for fevers greater than 101 orally you may alternate ibuprofen and tylenol every  3 hours.   Meds ordered this encounter  Medications   molnupiravir EUA (LAGEVRIO) 200 mg CAPS capsule    Sig: Take 4 capsules (800 mg total) by mouth 2 (two) times daily for 5 days.    Dispense:  40 capsule    Refill:  0    Order Specific Question:   Supervising Provider    Answer:   Noemi Chapel [[id number]]      Follow Up Instructions: I discussed the assessment and treatment plan with the patient. The patient was provided an opportunity to ask questions and all were answered. The patient agreed with the plan and demonstrated an understanding of the instructions.  A copy of instructions were sent to the patient via MyChart.  The patient was advised to call back or seek an in-person evaluation if the symptoms worsen or if the condition fails to improve as anticipated.  Time:  I spent 11 minutes with the patient via telehealth technology  discussing the above problems/concerns.    [name], FNP

## 2021-07-31 NOTE — Patient Instructions (Signed)
COVID-19: Quarantine and Isolation °Quarantine °If you were exposed °Quarantine and stay away from others when you have been in close contact with someone who has COVID-19. °Isolate °If you are sick or test positive °Isolate when you are sick or when you have COVID-19, even if you don't have symptoms. °When to stay home °Calculating quarantine °The date of your exposure is considered day 0. Day 1 is the first full day after your last contact with a person who has had COVID-19. Stay home and away from other people for at least 5 days. Learn why CDC updated guidance for the general public. °IF YOU were exposed to COVID-19 and are NOT  °up to dateIF YOU were exposed to COVID-19 and are NOT on COVID-19 vaccinations °Quarantine for at least 5 days °Stay home °Stay home and quarantine for at least 5 full days. °Wear a well-fitting mask if you must be around others in your home. °Do not travel. °Get tested °Even if you don't develop symptoms, get tested at least 5 days after you last had close contact with someone with COVID-19. °After quarantine °Watch for symptoms °Watch for symptoms until 10 days after you last had close contact with someone with COVID-19. °Avoid travel °It is best to avoid travel until a full 10 days after you last had close contact with someone with COVID-19. °If you develop symptoms °Isolate immediately and get tested. Continue to stay home until you know the results. Wear a well-fitting mask around others. °Take precautions until day 10 °Wear a well-fitting mask °Wear a well-fitting mask for 10 full days any time you are around others inside your home or in public. Do not go to places where you are unable to wear a well-fitting mask. °If you must travel during days 6-10, take precautions. °Avoid being around people who are more likely to get very sick from COVID-19. °IF YOU were exposed to COVID-19 and are  °up to dateIF YOU were exposed to COVID-19 and are on COVID-19 vaccinations °No  quarantine °You do not need to stay home unless you develop symptoms. °Get tested °Even if you don't develop symptoms, get tested at least 5 days after you last had close contact with someone with COVID-19. °Watch for symptoms °Watch for symptoms until 10 days after you last had close contact with someone with COVID-19. °If you develop symptoms °Isolate immediately and get tested. Continue to stay home until you know the results. Wear a well-fitting mask around others. °Take precautions until day 10 °Wear a well-fitting mask °Wear a well-fitting mask for 10 full days any time you are around others inside your home or in public. Do not go to places where you are unable to wear a well-fitting mask. °Take precautions if traveling °Avoid being around people who are more likely to get very sick from COVID-19. °IF YOU were exposed to COVID-19 and had confirmed COVID-19 within the past 90 days (you tested positive using a viral test) °No quarantine °You do not need to stay home unless you develop symptoms. °Watch for symptoms °Watch for symptoms until 10 days after you last had close contact with someone with COVID-19. °If you develop symptoms °Isolate immediately and get tested. Continue to stay home until you know the results. Wear a well-fitting mask around others. °Take precautions until day 10 °Wear a well-fitting mask °Wear a well-fitting mask for 10 full days any time you are around others inside your home or in public. Do not go to places where you are   unable to wear a well-fitting mask. °Take precautions if traveling °Avoid being around people who are more likely to get very sick from COVID-19. °Calculating isolation °Day 0 is your first day of symptoms or a positive viral test. Day 1 is the first full day after your symptoms developed or your test specimen was collected. If you have COVID-19 or have symptoms, isolate for at least 5 days. °IF YOU tested positive for COVID-19 or have symptoms, regardless of  vaccination status °Stay home for at least 5 days °Stay home for 5 days and isolate from others in your home. °Wear a well-fitting mask if you must be around others in your home. °Do not travel. °Ending isolation if you had symptoms °End isolation after 5 full days if you are fever-free for 24 hours (without the use of fever-reducing medication) and your symptoms are improving. °Ending isolation if you did NOT have symptoms °End isolation after at least 5 full days after your positive test. °If you got very sick from COVID-19 or have a weakened immune system °You should isolate for at least 10 days. Consult your doctor before ending isolation. °Take precautions until day 10 °Wear a well-fitting mask °Wear a well-fitting mask for 10 full days any time you are around others inside your home or in public. Do not go to places where you are unable to wear a well-fitting mask. °Do not travel °Do not travel until a full 10 days after your symptoms started or the date your positive test was taken if you had no symptoms. °Avoid being around people who are more likely to get very sick from COVID-19. °Definitions °Exposure °Contact with someone infected with SARS-CoV-2, the virus that causes COVID-19, in a way that increases the likelihood of getting infected with the virus. °Close contact °A close contact is someone who was less than 6 feet away from an infected person (laboratory-confirmed or a clinical diagnosis) for a cumulative total of 15 minutes or more over a 24-hour period. For example, three individual 5-minute exposures for a total of 15 minutes. People who are exposed to someone with COVID-19 after they completed at least 5 days of isolation are not considered close contacts. °Quarantine °Quarantine is a strategy used to prevent transmission of COVID-19 by keeping people who have been in close contact with someone with COVID-19 apart from others. °Who does not need to quarantine? °If you had close contact with  someone with COVID-19 and you are in one of the following groups, you do not need to quarantine. °You are up to date with your COVID-19 vaccines. °You had confirmed COVID-19 within the last 90 days (meaning you tested positive using a viral test). °If you are up to date with COVID-19 vaccines, you should wear a well-fitting mask around others for 10 days from the date of your last close contact with someone with COVID-19 (the date of last close contact is considered day 0). Get tested at least 5 days after you last had close contact with someone with COVID-19. If you test positive or develop COVID-19 symptoms, isolate from other people and follow recommendations in the Isolation section below. If you tested positive for COVID-19 with a viral test within the previous 90 days and subsequently recovered and remain without COVID-19 symptoms, you do not need to quarantine or get tested after close contact. You should wear a well-fitting mask around others for 10 days from the date of your last close contact with someone with COVID-19 (the date of last   close contact is considered day 0). If you have COVID-19 symptoms, get tested and isolate from other people and follow recommendations in the Isolation section below. °Who should quarantine? °If you come into close contact with someone with COVID-19, you should quarantine if you are not up to date on COVID-19 vaccines. This includes people who are not vaccinated. °What to do for quarantine °Stay home and away from other people for at least 5 days (day 0 through day 5) after your last contact with a person who has COVID-19. The date of your exposure is considered day 0. Wear a well-fitting mask when around others at home, if possible. °For 10 days after your last close contact with someone with COVID-19, watch for fever (100.4°F or greater), cough, shortness of breath, or other COVID-19 symptoms. °If you develop symptoms, get tested immediately and isolate until you receive  your test results. If you test positive, follow isolation recommendations. °If you do not develop symptoms, get tested at least 5 days after you last had close contact with someone with COVID-19. °If you test negative, you can leave your home, but continue to wear a well-fitting mask when around others at home and in public until 10 days after your last close contact with someone with COVID-19. °If you test positive, you should isolate for at least 5 days from the date of your positive test (if you do not have symptoms). If you do develop COVID-19 symptoms, isolate for at least 5 days from the date your symptoms began (the date the symptoms started is day 0). Follow recommendations in the isolation section below. °If you are unable to get a test 5 days after last close contact with someone with COVID-19, you can leave your home after day 5 if you have been without COVID-19 symptoms throughout the 5-day period. Wear a well-fitting mask for 10 days after your date of last close contact when around others at home and in public. °Avoid people who are have weakened immune systems or are more likely to get very sick from COVID-19, and nursing homes and other high-risk settings, until after at least 10 days. °If possible, stay away from people you live with, especially people who are at higher risk for getting very sick from COVID-19, as well as others outside your home throughout the full 10 days after your last close contact with someone with COVID-19. °If you are unable to quarantine, you should wear a well-fitting mask for 10 days when around others at home and in public. °If you are unable to wear a mask when around others, you should continue to quarantine for 10 days. Avoid people who have weakened immune systems or are more likely to get very sick from COVID-19, and nursing homes and other high-risk settings, until after at least 10 days. °See additional information about travel. °Do not go to places where you are  unable to wear a mask, such as restaurants and some gyms, and avoid eating around others at home and at work until after 10 days after your last close contact with someone with COVID-19. °After quarantine °Watch for symptoms until 10 days after your last close contact with someone with COVID-19. °If you have symptoms, isolate immediately and get tested. °Quarantine in high-risk congregate settings °In certain congregate settings that have high risk of secondary transmission (such as correctional and detention facilities, homeless shelters, or cruise ships), CDC recommends a 10-day quarantine for residents, regardless of vaccination and booster status. During periods of critical staffing   shortages, facilities may consider shortening the quarantine period for staff to ensure continuity of operations. Decisions to shorten quarantine in these settings should be made in consultation with state, local, tribal, or territorial health departments and should take into consideration the context and characteristics of the facility. CDC's setting-specific guidance provides additional recommendations for these settings. °Isolation °Isolation is used to separate people with confirmed or suspected COVID-19 from those without COVID-19. People who are in isolation should stay home until it's safe for them to be around others. At home, anyone sick or infected should separate from others, or wear a well-fitting mask when they need to be around others. People in isolation should stay in a specific "sick room" or area and use a separate bathroom if available. Everyone who has presumed or confirmed COVID-19 should stay home and isolate from other people for at least 5 full days (day 0 is the first day of symptoms or the date of the day of the positive viral test for asymptomatic persons). They should wear a mask when around others at home and in public for an additional 5 days. People who are confirmed to have COVID-19 or are showing  symptoms of COVID-19 need to isolate regardless of their vaccination status. This includes: °People who have a positive viral test for COVID-19, regardless of whether or not they have symptoms. °People with symptoms of COVID-19, including people who are awaiting test results or have not been tested. People with symptoms should isolate even if they do not know if they have been in close contact with someone with COVID-19. °What to do for isolation °Monitor your symptoms. If you have an emergency warning sign (including trouble breathing), seek emergency medical care immediately. °Stay in a separate room from other household members, if possible. °Use a separate bathroom, if possible. °Take steps to improve ventilation at home, if possible. °Avoid contact with other members of the household and pets. °Don't share personal household items, like cups, towels, and utensils. °Wear a well-fitting mask when you need to be around other people. °Learn more about what to do if you are sick and how to notify your contacts. °Ending isolation for people who had COVID-19 and had symptoms °If you had COVID-19 and had symptoms, isolate for at least 5 days. To calculate your 5-day isolation period, day 0 is your first day of symptoms. Day 1 is the first full day after your symptoms developed. You can leave isolation after 5 full days. °You can end isolation after 5 full days if you are fever-free for 24 hours without the use of fever-reducing medication and your other symptoms have improved (Loss of taste and smell may persist for weeks or months after recovery and need not delay the end of isolation). °You should continue to wear a well-fitting mask around others at home and in public for 5 additional days (day 6 through day 10) after the end of your 5-day isolation period. If you are unable to wear a mask when around others, you should continue to isolate for a full 10 days. Avoid people who have weakened immune systems or are more  likely to get very sick from COVID-19, and nursing homes and other high-risk settings, until after at least 10 days. °If you continue to have fever or your other symptoms have not improved after 5 days of isolation, you should wait to end your isolation until you are fever-free for 24 hours without the use of fever-reducing medication and your other symptoms have improved.   Continue to wear a well-fitting mask through day 10. Contact your healthcare provider if you have questions. °See additional information about travel. °Do not go to places where you are unable to wear a mask, such as restaurants and some gyms, and avoid eating around others at home and at work until a full 10 days after your first day of symptoms. °If an individual has access to a test and wants to test, the best approach is to use an antigen test1 towards the end of the 5-day isolation period. Collect the test sample only if you are fever-free for 24 hours without the use of fever-reducing medication and your other symptoms have improved (loss of taste and smell may persist for weeks or months after recovery and need not delay the end of isolation). If your test result is positive, you should continue to isolate until day 10. If your test result is negative, you can end isolation, but continue to wear a well-fitting mask around others at home and in public until day 10. Follow additional recommendations for masking and avoiding travel as described above. °1As noted in the labeling for authorized over-the counter antigen tests: Negative results should be treated as presumptive. Negative results do not rule out SARS-CoV-2 infection and should not be used as the sole basis for treatment or patient management decisions, including infection control decisions. To improve results, antigen tests should be used twice over a three-day period with at least 24 hours and no more than 48 hours between tests. °Note that these recommendations on ending isolation  do not apply to people who are moderately ill or very sick from COVID-19 or have weakened immune systems. See section below for recommendations for when to end isolation for these groups. °Ending isolation for people who tested positive for COVID-19 but had no symptoms °If you test positive for COVID-19 and never develop symptoms, isolate for at least 5 days. Day 0 is the day of your positive viral test (based on the date you were tested) and day 1 is the first full day after the specimen was collected for your positive test. You can leave isolation after 5 full days. °If you continue to have no symptoms, you can end isolation after at least 5 days. °You should continue to wear a well-fitting mask around others at home and in public until day 10 (day 6 through day 10). If you are unable to wear a mask when around others, you should continue to isolate for 10 days. Avoid people who have weakened immune systems or are more likely to get very sick from COVID-19, and nursing homes and other high-risk settings, until after at least 10 days. °If you develop symptoms after testing positive, your 5-day isolation period should start over. Day 0 is your first day of symptoms. Follow the recommendations above for ending isolation for people who had COVID-19 and had symptoms. °See additional information about travel. °Do not go to places where you are unable to wear a mask, such as restaurants and some gyms, and avoid eating around others at home and at work until 10 days after the day of your positive test. °If an individual has access to a test and wants to test, the best approach is to use an antigen test1 towards the end of the 5-day isolation period. If your test result is positive, you should continue to isolate until day 10. If your test result is positive, you can also choose to test daily and if your test result   is negative, you can end isolation, but continue to wear a well-fitting mask around others at home and in  public until day 10. Follow additional recommendations for masking and avoiding travel as described above. °1As noted in the labeling for authorized over-the counter antigen tests: Negative results should be treated as presumptive. Negative results do not rule out SARS-CoV-2 infection and should not be used as the sole basis for treatment or patient management decisions, including infection control decisions. To improve results, antigen tests should be used twice over a three-day period with at least 24 hours and no more than 48 hours between tests. °Ending isolation for people who were moderately or very sick from COVID-19 or have a weakened immune system °People who are moderately ill from COVID-19 (experiencing symptoms that affect the lungs like shortness of breath or difficulty breathing) should isolate for 10 days and follow all other isolation precautions. To calculate your 10-day isolation period, day 0 is your first day of symptoms. Day 1 is the first full day after your symptoms developed. If you are unsure if your symptoms are moderate, talk to a healthcare provider for further guidance. °People who are very sick from COVID-19 (this means people who were hospitalized or required intensive care or ventilation support) and people who have weakened immune systems might need to isolate at home longer. They may also require testing with a viral test to determine when they can be around others. CDC recommends an isolation period of at least 10 and up to 20 days for people who were very sick from COVID-19 and for people with weakened immune systems. Consult with your healthcare provider about when you can resume being around other people. If you are unsure if your symptoms are severe or if you have a weakened immune system, talk to a healthcare provider for further guidance. °People who have a weakened immune system should talk to their healthcare provider about the potential for reduced immune responses to  COVID-19 vaccines and the need to continue to follow current prevention measures (including wearing a well-fitting mask and avoiding crowds and poorly ventilated indoor spaces) to protect themselves against COVID-19 until advised otherwise by their healthcare provider. Close contacts of immunocompromised people--including household members--should also be encouraged to receive all recommended COVID-19 vaccine doses to help protect these people. °Isolation in high-risk congregate settings °In certain high-risk congregate settings that have high risk of secondary transmission and where it is not feasible to cohort people (such as correctional and detention facilities, homeless shelters, and cruise ships), CDC recommends a 10-day isolation period for residents. During periods of critical staffing shortages, facilities may consider shortening the isolation period for staff to ensure continuity of operations. Decisions to shorten isolation in these settings should be made in consultation with state, local, tribal, or territorial health departments and should take into consideration the context and characteristics of the facility. CDC's setting-specific guidance provides additional recommendations for these settings. °This CDC guidance is meant to supplement--not replace--any federal, state, local, territorial, or tribal health and safety laws, rules, and regulations. °Recommendations for specific settings °These recommendations do not apply to healthcare professionals. For guidance specific to these settings, see °Healthcare professionals: Interim Guidance for Managing Healthcare Personnel with SARS-CoV-2 Infection or Exposure to SARS-CoV-2 °Patients, residents, and visitors to healthcare settings: Interim Infection Prevention and Control Recommendations for Healthcare Personnel During the Coronavirus Disease 2019 (COVID-19) Pandemic °Additional setting-specific guidance and recommendations are available. °These  recommendations on quarantine and isolation do apply to [employer] School   settings. Additional guidance is available here: Overview of COVID-19 Quarantine for K-12 Schools °Travelers: Travel information and recommendations °Congregate facilities and other settings: guidance pages for community, work, and school settings °Ongoing COVID-19 exposure FAQs °I live with someone with COVID-19, but I cannot be separated from them. How do we manage quarantine in this situation? °It is very important for people with COVID-19 to remain apart from other people, if possible, even if they are living together. If separation of the person with COVID-19 from others that they live with is not possible, the other people that they live with will have ongoing exposure, meaning they will be repeatedly exposed until that person is no longer able to spread the virus to other people. In this situation, there are precautions you can take to limit the spread of COVID-19: °The person with COVID-19 and everyone they live with should wear a well-fitting mask inside the home. °If possible, one person should care for the person with COVID-19 to limit the number of people who are in close contact with the infected person. °Take steps to protect yourself and others to reduce transmission in the home: °Quarantine if you are not up to date with your COVID-19 vaccines. °Isolate if you are sick or tested positive for COVID-19, even if you don't have symptoms. °Learn more about the public health recommendations for testing, mask use and quarantine of close contacts, like yourself, who have ongoing exposure. These recommendations differ depending on your vaccination status. °What should I do if I have ongoing exposure to COVID-19 from someone I live with? °Recommendations for this situation depend on your vaccination status: °If you are not up to date on COVID-19 vaccines and have ongoing exposure to COVID-19, you should: °Begin quarantine immediately and  continue to quarantine throughout the isolation period of the person with COVID-19. °Continue to quarantine for an additional 5 days starting the day after the end of isolation for the person with COVID-19. °Get tested at least 5 days after the end of isolation of the infected person that lives with them. °If you test negative, you can leave the home but should continue to wear a well-fitting mask when around others at home and in public until 10 days after the end of isolation for the person with COVID-19. °Isolate immediately if you develop symptoms of COVID-19 or test positive. °If you are up to date with COVID-19 vaccines and have ongoing exposure to COVID-19, you should: °Get tested at least 5 days after your first exposure. A person with COVID-19 is considered infectious starting 2 days before they develop symptoms, or 2 days before the date of their positive test if they do not have symptoms. °Get tested again at least 5 days after the end of isolation for the person with COVID-19. °Wear a well-fitting mask when you are around the person with COVID-19, and do this throughout their isolation period. °Wear a well-fitting mask around others for 10 days after the infected person's isolation period ends. °Isolate immediately if you develop symptoms of COVID-19 or test positive. °What should I do if multiple people I live with test positive for COVID-19 at different times? °Recommendations for this situation depend on your vaccination status: °If you are not up to date with your COVID-19 vaccines, you should: °Quarantine throughout the isolation period of any infected person that you live with. °Continue to quarantine until 5 days after the end of isolation date for the most recently infected person that lives with you. For example, if   the last day of isolation of the person most recently infected with COVID-19 was June 30, the new 5-day quarantine period starts on July 1. °Get tested at least 5 days after the end  of isolation for the most recently infected person that lives with you. °Wear a well-fitting mask when you are around any person with COVID-19 while that person is in isolation. °Wear a well-fitting mask when you are around other people until 10 days after your last close contact. °Isolate immediately if you develop symptoms of COVID-19 or test positive. °If you are up to date with your COVID-19 vaccines, you should: °Get tested at least 5 days after your first exposure. A person with COVID-19 is considered infectious starting 2 days before they developed symptoms, or 2 days before the date of their positive test if they do not have symptoms. °Get tested again at least 5 days after the end of isolation for the most recently infected person that lives with you. °Wear a well-fitting mask when you are around any person with COVID-19 while that person is in isolation. °Wear a well-fitting mask around others for 10 days after the end of isolation for the most recently infected person that lives with you. For example, if the last day of isolation for the person most recently infected with COVID-19 was June 30, the new 10-day period to wear a well-fitting mask indoors in public starts on July 1. °Isolate immediately if you develop symptoms of COVID-19 or test positive. °I had COVID-19 and completed isolation. Do I have to quarantine or get tested if someone I live with gets COVID-19 shortly after I completed isolation? °No. If you recently completed isolation and someone that lives with you tests positive for the virus that causes COVID-19 shortly after the end of your isolation period, you do not have to quarantine or get tested as long as you do not develop new symptoms. Once all of the people that live together have completed isolation or quarantine, refer to the guidance below for new exposures to COVID-19. °If you had COVID-19 in the previous 90 days and then came into close contact with someone with COVID-19, you do  not have to quarantine or get tested if you do not have symptoms. But you should: °Wear a well-fitting mask indoors in public for 10 days after your last close contact. °Monitor for COVID-19 symptoms for 10 days from the date of your last close contact. °Isolate immediately and get tested if symptoms develop. °If more than 90 days have passed since your recovery from infection, follow CDC's recommendations for close contacts. These recommendations will differ depending on your vaccination status. °10/07/2020 °Content source: National Center for Immunization and Respiratory Diseases (NCIRD), Division of Viral Diseases °This information is not intended to replace advice given to you by your health care provider. Make sure you discuss any questions you have with your health care provider. °Document Revised: 02/10/2021 Document Reviewed: 02/10/2021 °Elsevier Patient Education © 2022 [employer] ° °

## 2021-08-01 ENCOUNTER — Telehealth: Payer: Medicare Other | Admitting: Family

## 2021-08-01 DIAGNOSIS — Z7189 Other specified counseling: Secondary | ICD-10-CM | POA: Diagnosis not present

## 2021-08-01 NOTE — Progress Notes (Signed)
Virtual Visit Consent   Julie May, you are scheduled for a virtual visit with a [facility] provider today.     Just as with appointments in the office, your consent must be obtained to participate.  Your consent will be active for this visit and any virtual visit you may have with one of our providers in the next 365 days.     If you have a MyChart account, a copy of this consent can be sent to you electronically.  All virtual visits are billed to your insurance company just like a traditional visit in the office.    As this is a virtual visit, video technology does not allow for your provider to perform a traditional examination.  This may limit your provider's ability to fully assess your condition.  If your provider identifies any concerns that need to be evaluated in person or the need to arrange testing (such as labs, EKG, etc.), we will make arrangements to do so.     Although advances in technology are sophisticated, we cannot ensure that it will always work on either your end or our end.  If the connection with a video visit is poor, the visit may have to be switched to a telephone visit.  With either a video or telephone visit, we are not always able to ensure that we have a secure connection.     I need to obtain your verbal consent now.   Are you willing to proceed with your visit today?    Julie May has provided verbal consent on 08/01/2021 for a virtual visit (video or telephone).   Evelina Dun, FNP   Date: 08/01/2021 2:09 PM   Virtual Visit via Video Note   I, Evelina Dun, connected with  Julie May  ([id number], September 21, 1950) on 08/01/21 at  2:15 PM EST by a video-enabled telemedicine application and verified that I am speaking with the correct person using two identifiers.  Location: Patient: Virtual Visit Location Patient: Home Provider: Virtual Visit Location Provider: Home Office   I discussed the limitations of evaluation and management by telemedicine and the  availability of in person appointments. The patient expressed understanding and agreed to proceed.    History of Present Illness: Julie May is a 71 y.o. who identifies as a female who was assigned female at birth, and is being seen today for COVID. She reports she took a COVID test yesterday and was positive. She did a video visit and received antivirals. However, she has taken three test and was negative. Reports she is not having any symptoms and wants to stop the antivirals.   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 , Disp: , Rfl:    losartan (COZAAR) 25 MG tablet, TAKE 1 TABLET(25 MG) BY MOUTH DAILY,  Disp: 90 tablet, Rfl: 0   molnupiravir EUA (LAGEVRIO) 200 mg CAPS capsule, Take 4 capsules (800 mg total) by mouth 2 (two) times daily for 5 days., Disp: 40 capsule, Rfl: 0   TIROSINT 75 MCG CAPS, TAKE 1 CAPSULE BY MOUTH DAILY BEFORE BREAKFAST., Disp: 90 capsule, Rfl: 3  Observations/Objective: Patient is well-developed, well-nourished in no acute distress.  Resting comfortably  at home.  Head is normocephalic, atraumatic.  No labored breathing.  Speech is clear and coherent with logical content.  Patient is alert and oriented at baseline.  No cough  or SOB  Assessment and Plan: 1. Educated about 2019-nCoV infection  Given patient is not having any symptoms and is feeling fine with three negative COVID tests. She can stop antivirals.  Force fluids Rest Follow up if symptoms worsen or do not improve   Follow Up Instructions: I discussed the assessment and treatment plan with the patient. The patient was provided an opportunity to ask questions and all were answered. The patient agreed with the plan and demonstrated an understanding of the instructions.  A copy of instructions were sent to the patient via MyChart unless otherwise noted below.     The patient was advised to call back or seek an in-person evaluation if the symptoms worsen or if the condition fails to improve as anticipated.  Time:  I spent 6 minutes with the patient via telehealth technology discussing the above problems/concerns.    Evelina Dun, FNP

## 2021-08-18 ENCOUNTER — Encounter: Payer: Self-pay | Admitting: Family Medicine

## 2021-08-18 ENCOUNTER — Other Ambulatory Visit: Payer: Self-pay

## 2021-08-18 ENCOUNTER — Ambulatory Visit (INDEPENDENT_AMBULATORY_CARE_PROVIDER_SITE_OTHER): Payer: Medicare Other | Admitting: Family Medicine

## 2021-08-18 VITALS — BP 144/80 | HR 91 | Ht 65.0 in | Wt 153.1 lb

## 2021-08-18 DIAGNOSIS — I10 Essential (primary) hypertension: Secondary | ICD-10-CM

## 2021-08-18 DIAGNOSIS — E038 Other specified hypothyroidism: Secondary | ICD-10-CM

## 2021-08-18 DIAGNOSIS — E663 Overweight: Secondary | ICD-10-CM

## 2021-08-18 NOTE — Patient Instructions (Signed)
F/U mid October, call if you need me sooner, please bring your cuff to the visit  You need the pneumonia vaccine (20)  Check blood pressure twice weekly, if staying above 140 , please call and come in sooner  Goal is 130/80 or less, normal is under 120/80  It is important that you exercise regularly at least 30 minutes 5 times a week. If you develop chest pain, have severe difficulty breathing, or feel very tired, stop exercising immediately and seek medical attention   Reduce salt  Increase  vegetables, freh or frozen  Thanks for choosing [facility], we consider it a privelige to serve you.

## 2021-08-22 ENCOUNTER — Encounter: Payer: Self-pay | Admitting: Family Medicine

## 2021-08-22 NOTE — Assessment & Plan Note (Signed)
Uncontrolled and not at goal by in office reading. Pt reports normal and low reading at [facility], wants to hold on dose increase. Ill monitor at home, clll if gettignelevated readings for sooner appt. F/U in 6 months DASH diet and commitment to daily physical activity for a minimum of 30 minutes discussed and encouraged, as a part of hypertension management. The importance of attaining a healthy weight is also discussed.  BP/Weight 08/18/2021 06/25/2021 04/28/2021 02/11/2021 12/24/2020 08/19/2020 09/01/4112  Systolic BP 643 142 767 011 003 496 116  Diastolic BP 80 78 89 84 82 94 72  Wt. (Lbs) 153.12 153.8 153 158 167.8 169.6 167  BMI 25.48 25.59 25.46 26.09 27.92 28.22 27.79

## 2021-08-22 NOTE — Assessment & Plan Note (Signed)
Managed by Endo and controlled 

## 2021-08-22 NOTE — Progress Notes (Signed)
° °  Julie May     MRN: [id number]      DOB: [birth year]   HPI Ms. Parkinson is here for follow up and re-evaluation of chronic medical conditions in particular hypertension, medication management and review of any available recent lab and radiology data.  Preventive health is updated, specifically  Cancer screening and Immunization.   Questions or concerns regarding consultations or procedures which the PT has had in the interim are  addressed. Reports low blood pressure readings at home, reluctant therefore to cange med dose   ROS Denies recent fever or chills. Denies sinus pressure, nasal congestion, ear pain or sore throat. Denies chest congestion, productive cough or wheezing. Denies chest pains, palpitations and leg swelling Denies abdominal pain, nausea, vomiting,diarrhea or constipation.   Denies dysuria, frequency, hesitancy or incontinence. Denies joint pain, swelling and limitation in mobility. Denies headaches, seizures, numbness, or tingling. Denies depression, anxiety or insomnia. Denies skin break down or rash.   PE  BP (!) 144/80    Pulse 91    Ht 5\' 5"  (1.651 m)    Wt 153 lb 1.9 oz (69.5 kg)    SpO2 97%    BMI 25.48 kg/m   Patient alert and oriented and in no cardiopulmonary distress.  HEENT: No facial asymmetry, EOMI,     Neck supple .  Chest: Clear to auscultation bilaterally.  CVS: S1, S2 no murmurs, no S3.Regular rate.  ABD: Soft non tender.   Ext: No edema  MS: Adequate ROM spine, shoulders, hips and knees.  Skin: Intact, no ulcerations or rash noted.  Psych: Good eye contact, normal affect. Memory intact not anxious or depressed appearing.  CNS: CN 2-12 intact, power,  normal throughout.no focal deficits noted.   Assessment & Plan  Essential hypertension, benign Uncontrolled and not at goal by in office reading. Pt reports normal and low reading at [facility], wants to hold on dose increase. Ill monitor at home, clll if gettignelevated readings for sooner  appt. F/U in 6 months DASH diet and commitment to daily physical activity for a minimum of 30 minutes discussed and encouraged, as a part of hypertension management. The importance of attaining a healthy weight is also discussed.  BP/Weight 08/18/2021 06/25/2021 04/28/2021 02/11/2021 12/24/2020 08/19/2020 0/34/7425  Systolic BP 956 387 564 332 951 884 166  Diastolic BP 80 78 89 84 82 94 72  Wt. (Lbs) 153.12 153.8 153 158 167.8 169.6 167  BMI 25.48 25.59 25.46 26.09 27.92 28.22 27.79       Hypothyroidism Managed by Endo and controlled  Overweight  Patient re-educated about  the importance of commitment to a  minimum of 150 minutes of exercise per week as able.  The importance of healthy food choices with portion control discussed, as well as eating regularly and within a 12 hour window most days. The need to choose "clean , green" food 50 to 75% of the time is discussed, as well as to make water the primary drink and set a goal of 64 ounces water daily.    Weight /BMI 08/18/2021 06/25/2021 04/28/2021  WEIGHT 153 lb 1.9 oz 153 lb 12.8 oz 153 lb  HEIGHT 5\' 5"  5\' 5"  5\' 5"   BMI 25.48 kg/m2 25.59 kg/m2 25.46 kg/m2    Improving well with  dietary change, she is applauded on this

## 2021-08-22 NOTE — Assessment & Plan Note (Signed)
°  Patient re-educated about  the importance of commitment to a  minimum of 150 minutes of exercise per week as able.  The importance of healthy food choices with portion control discussed, as well as eating regularly and within a 12 hour window most days. The need to choose "clean , green" food 50 to 75% of the time is discussed, as well as to make water the primary drink and set a goal of 64 ounces water daily.    Weight /BMI 08/18/2021 06/25/2021 04/28/2021  WEIGHT 153 lb 1.9 oz 153 lb 12.8 oz 153 lb  HEIGHT 5\' 5"  5\' 5"  5\' 5"   BMI 25.48 kg/m2 25.59 kg/m2 25.46 kg/m2    Improving well with  dietary change, she is applauded on this

## 2021-08-22 NOTE — Assessment & Plan Note (Deleted)
Managed by Endo and controlled on med 

## 2021-09-10 ENCOUNTER — Other Ambulatory Visit: Payer: Self-pay | Admitting: "Endocrinology

## 2021-10-19 ENCOUNTER — Other Ambulatory Visit (HOSPITAL_COMMUNITY): Payer: Self-pay

## 2021-10-20 ENCOUNTER — Other Ambulatory Visit (HOSPITAL_COMMUNITY): Payer: Self-pay

## 2021-12-10 ENCOUNTER — Other Ambulatory Visit: Payer: Self-pay | Admitting: "Endocrinology

## 2021-12-21 DIAGNOSIS — E038 Other specified hypothyroidism: Secondary | ICD-10-CM | POA: Diagnosis not present

## 2021-12-22 LAB — T4, FREE: Free T4: 2.23 ng/dL — ABNORMAL HIGH (ref 0.82–1.77)

## 2021-12-22 LAB — TSH: TSH: 1.24 u[IU]/mL (ref 0.450–4.500)

## 2021-12-24 ENCOUNTER — Ambulatory Visit: Payer: Medicare Other | Admitting: "Endocrinology

## 2021-12-28 ENCOUNTER — Encounter: Payer: Self-pay | Admitting: "Endocrinology

## 2021-12-28 ENCOUNTER — Ambulatory Visit (INDEPENDENT_AMBULATORY_CARE_PROVIDER_SITE_OTHER): Payer: Medicare Other | Admitting: "Endocrinology

## 2021-12-28 ENCOUNTER — Other Ambulatory Visit (HOSPITAL_COMMUNITY): Payer: Self-pay

## 2021-12-28 VITALS — BP 120/72 | HR 76 | Ht 65.0 in | Wt 150.8 lb

## 2021-12-28 DIAGNOSIS — E038 Other specified hypothyroidism: Secondary | ICD-10-CM | POA: Diagnosis not present

## 2021-12-28 DIAGNOSIS — R7303 Prediabetes: Secondary | ICD-10-CM

## 2021-12-28 DIAGNOSIS — E782 Mixed hyperlipidemia: Secondary | ICD-10-CM

## 2021-12-28 MED ORDER — TIROSINT 50 MCG PO CAPS
50.0000 ug | ORAL_CAPSULE | Freq: Every day | ORAL | 2 refills | Status: DC
  Filled 2021-12-28: qty 30, 30d supply, fill #0
  Filled 2022-01-18: qty 30, 30d supply, fill #1
  Filled 2022-02-17: qty 30, 30d supply, fill #2

## 2021-12-28 NOTE — Progress Notes (Signed)
12/28/2021        Endocrinology Follow Up Visit   Subjective:    Patient ID: Julie May, female    DOB: Dec 12, 1950, PCP Julie Helper, May   Past Medical History:  Diagnosis Date   Bilateral impacted cerumen 04/10/2019   Callus of foot 11/07/2017   Foot pain, bilateral 05/23/2013   Hyperlipidemia    Hyperpotassemia    Hypertension    new dx 11/12/2010   Hypothyroidism    OA (osteoarthritis)    Other abnormal glucose    Overweight(278.02)    Polycythemia, secondary    Routine general medical examination at [facility]    THYROID NODULE 01/18/2010   Qualifier: Diagnosis of  By: Julie May, Julie May    Trigger finger (acquired)    Unspecified disorder of thyroid    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 Unice May May   TONSILECTOMY, ADENOIDECTOMY, BILATERAL MYRINGOTOMY AND TUBES     TONSILLECTOMY     uterine polyp removal      Social History   Socioeconomic History   Marital status: Married    Spouse name: Julie May   Number of children: 0   Years of education: Not on file   Highest education level: Not on file  Occupational History   Occupation: Clinical cytogeneticist   Tobacco Use   Smoking status: Never   Smokeless tobacco: Never  Vaping Use   Vaping Use: Never used  Substance and Sexual Activity   Alcohol use: Yes    Comment: occ.   Drug use: No   Sexual activity: Yes    Birth control/protection: Post-menopausal  Other Topics Concern   Not on file  Social History Narrative   Married x 36 years 2022.   Social Determinants of Health   Financial Resource Strain: Low Risk  (04/28/2021)   Overall Financial Resource Strain (CARDIA)    Difficulty of Paying Living Expenses: Not hard at all  Food Insecurity: No  Food Insecurity (04/28/2021)   Hunger Vital Sign    Worried About Running Out of Food in the Last Year: Never true    Ran Out of Food in the Last Year: Never true  Transportation Needs: No Transportation Needs (04/28/2021)   PRAPARE - Hydrologist (Medical): No    Lack of Transportation (Non-Medical): No  Physical Activity: Sufficiently Active (04/28/2021)   Exercise Vital Sign    Days of Exercise per Week: 5 days    Minutes of Exercise per Session: 30 min  Stress: No Stress Concern Present (04/28/2021)   [place]    Feeling of Stress : Not at all  Social Connections: [place] (04/28/2021)   Social Connection and Isolation Panel [NHANES]    Frequency of Communication with Friends and Family: More than three times May week    Frequency of Social Gatherings with Friends and Family: More than three times May week    Attends Religious Services: More than 4 times per year    Active Member of [employer] or Organizations: Yes    Attends Music therapist: More than 4 times per year    Marital Status: Married   Outpatient Encounter Medications as  of 12/28/2021  Medication Sig   TIROSINT 50 MCG CAPS Take 1 capsule (50 mcg total) by mouth daily before breakfast.   Cholecalciferol (VITAMIN D) 2000 units CAPS Take 2,000 Units by mouth daily with lunch.    losartan (COZAAR) 25 MG tablet TAKE 1 TABLET(25 MG) BY MOUTH DAILY   [DISCONTINUED] TIROSINT 75 MCG CAPS TAKE 1 CAPSULE BY MOUTH DAILY BEFORE BREAKFAST.   No facility-administered encounter medications on file as of 12/28/2021.   ALLERGIES: No Known Allergies VACCINATION STATUS: Immunization History  Administered Date(s) Administered   Fluad Quad(high Dose 65+) 04/10/2019, 04/13/2020, 04/20/2021   Influenza Split 06/22/2011   Influenza Whole 05/18/2006, 04/30/2007, 04/14/2008   Influenza,inj,Quad PF,6+ Mos 05/31/2013, 05/28/2014,  04/14/2015, 04/27/2016, 05/16/2017, 04/13/2018   Moderna SARS-COV2 Booster Vaccination 04/29/2021   Moderna Sars-Covid-2 Vaccination 08/23/2019, 09/20/2019, 05/23/2020, 10/30/2020   Pneumococcal Conjugate-13 09/14/2018   Td 08/13/2009   Zoster Recombinat (Shingrix) 03/30/2021, 08/12/2021   Zoster, Live 03/23/2011    Thyroid Problem Presents for follow-up visit. Symptoms include tremors. Patient reports no anxiety, cold intolerance, constipation, depressed mood, diarrhea, fatigue, heat intolerance, leg swelling, palpitations, weight gain or weight loss. The symptoms have been stable.   Hypertension: New diagnosis.  She is not on treatment. - Review of systems  Constitutional: + Minimally fluctuating body weight.,  current Body mass index is 25.09 kg/m. , no fatigue, no subjective hyperthermia, no subjective hypothermia    Objective:    BP 120/72   Pulse 76   Ht '5\' 5"'$  (1.651 m)   Wt 150 lb 12.8 oz (68.4 kg)   BMI 25.09 kg/m   Wt Readings from Last 3 Encounters:  12/28/21 150 lb 12.8 oz (68.4 kg)  08/18/21 153 lb 1.9 oz (69.5 kg)  06/25/21 153 lb 12.8 oz (69.8 kg)    BP Readings from Last 3 Encounters:  12/28/21 120/72  08/18/21 (!) 144/80  06/25/21 134/78    Physical Exam- Limited  Constitutional:  Body mass index is 25.09 kg/m. , not in acute distress, anxious state of mind Eyes:  EOMI, no exophthalmos Neck: Supple, palpable thyroid.  CMP     Component Value Date/Time   NA 134 02/11/2021 1456   K 5.0 02/11/2021 1456   CL 96 02/11/2021 1456   CO2 24 02/11/2021 1456   GLUCOSE 95 02/11/2021 1456   GLUCOSE 102 (H) 05/11/2020 0000   BUN 10 02/11/2021 1456   CREATININE 0.69 02/11/2021 1456   CREATININE 0.80 05/11/2020 0000   CALCIUM 9.4 02/11/2021 1456   PROT 7.3 05/11/2020 0000   ALBUMIN 4.3 04/03/2019 0934   AST 22 05/11/2020 0000   ALT 16 05/11/2020 0000   ALKPHOS 82 04/03/2019 0934   BILITOT 0.5 05/11/2020 0000   GFRNONAA 75 05/11/2020 0000   GFRAA 87  05/11/2020 0000     Diabetic Labs (most recent): Lab Results  Component Value Date   HGBA1C 5.5 (May) 06/25/2021   HGBA1C 5.7 (H) 05/11/2020   HGBA1C 5.8 (H) 04/03/2019      Lipid Panel     Component Value Date/Time   CHOL 198 06/21/2021 0845   TRIG 44 06/21/2021 0845   HDL 78 06/21/2021 0845   CHOLHDL 2.5 06/21/2021 0845   CHOLHDL 2.5 05/11/2020 0000   VLDL 10 04/03/2019 0934   LDLCALC 112 (H) 06/21/2021 0845   LDLCALC 110 (H) 05/11/2020 0000   Recent Results (from the past 2160 hour(s))  TSH     Status: None   Collection Time: 12/21/21 11:28 AM  Result Value  Ref Range   TSH 1.240 0.450 - 4.500 uIU/mL  T4, free     Status: Abnormal   Collection Time: 12/21/21 11:28 AM  Result Value Ref Range   Free T4 2.23 (H) 0.82 - 1.77 ng/dL     Assessment & Plan:   1.  Primary hypothyroidism -Her previsit thyroid function tests are consistent with slight over-replacement.  I discussed and lowered her TLC to 50 mcg p.o. daily before breakfast.    - We discussed about the correct intake of her thyroid hormone, on empty stomach at fasting, with water, separated by at least 30 minutes from breakfast and other medications,  and separated by more than 4 hours from calcium, iron, multivitamins, acid reflux medications (PPIs). -Patient is made aware of the fact that thyroid hormone replacement is needed for life, dose to be adjusted by periodic monitoring of thyroid function tests.  -She did not tolerate levothyroxine or Synthroid in the past.    2. Nontoxic multinodular goiter Her last ultrasound from 2017 was unchanged from previous studies, showing no significant findings.  She will be considered for 1 more thyroid ultrasound for surveillance before her next visit.  3.  Hypertension Patient was observed to have significantly elevated blood pressure on several instances.  She generally dislikes medications she is approached for low-dose losartan and hesitantly accepts.  I prescribed  losartan 25 mg p.o. daily with breakfast.  4) Hyperlipidemia: she does not like to take statins. She will benefit from [facility].    - she acknowledges that there is May room for improvement in her food and drink choices. - Suggestion is made for her to avoid simple carbohydrates  from her diet including Cakes, Sweet Desserts, Ice Cream, Soda (diet and regular), Sweet Tea, Candies, Chips, Cookies, Store Bought Juices, Alcohol , Artificial Sweeteners,  Coffee Creamer, and "Sugar-free" Products, Lemonade. This will help patient to have more stable blood glucose profile and potentially avoid unintended weight gain.  The following Lifestyle Medicine recommendations according to [place]  [facility]) were discussed and and offered to patient and she  agrees to start the journey:  May. Whole Foods, Plant-Based Nutrition comprising of fruits and vegetables, plant-based proteins, whole-grain carbohydrates was discussed in detail with the patient.   May list for source of those nutrients were also provided to the patient.  Patient will use only water or unsweetened tea for hydration. B.  The need to stay away from risky substances including alcohol, smoking; obtaining 7 to 9 hours of restorative sleep, at least 150 minutes of moderate intensity exercise weekly, the importance of healthy social connections,  and stress management techniques were discussed. C.  May full color page of  Calorie density of various food groups per pound showing examples of each food groups was provided to the patient.    5) Prediabetes- resolved with ac of 5.5%.  She will have repeat point-of-care A1c during her next visit.      - I advised patient to maintain close follow up with Julie Helper, May for primary care needs.   I spent 30 minutes in the care of the patient today including review of labs from Thyroid Function, CMP, and other relevant labs ; imaging/biopsy records (current and previous  including abstractions from other facilities); face-to-face time discussing  her lab results and symptoms, medications doses, her options of short and long term treatment based on the latest standards of care / guidelines;   and documenting the encounter.  [name]  participated in the discussions, expressed understanding, and voiced agreement with the above plans.  All questions were answered to her satisfaction. she is encouraged to contact clinic should she have any questions or concerns prior to her return visit.  Follow up plan: Return in about 4 months (around 04/29/2022) for F/U with Pre-visit Labs, A1c -NV.  Rayetta Pigg, [facility] [address] [place], [place] [address] Phone: [phone] Fax: [phone]  12/28/2021, 4:43 PM

## 2021-12-28 NOTE — Patient Instructions (Signed)

## 2021-12-29 ENCOUNTER — Other Ambulatory Visit (HOSPITAL_COMMUNITY): Payer: Self-pay

## 2022-01-03 DIAGNOSIS — H2513 Age-related nuclear cataract, bilateral: Secondary | ICD-10-CM | POA: Diagnosis not present

## 2022-01-18 ENCOUNTER — Other Ambulatory Visit (HOSPITAL_COMMUNITY): Payer: Self-pay

## 2022-01-19 ENCOUNTER — Other Ambulatory Visit (HOSPITAL_COMMUNITY): Payer: Self-pay

## 2022-01-21 ENCOUNTER — Other Ambulatory Visit (HOSPITAL_COMMUNITY): Payer: Self-pay

## 2022-02-10 ENCOUNTER — Other Ambulatory Visit: Payer: Self-pay | Admitting: *Deleted

## 2022-02-10 NOTE — Patient Outreach (Signed)
  Care Coordination   02/10/2022 Name: Julie May MRN: [id number] DOB: 1950-12-17   Care Coordination Outreach Attempts:  An unsuccessful telephone outreach was attempted today to offer the patient information about available care coordination services as a benefit of their health plan.   Follow Up Plan:  Additional outreach attempts will be made to offer the patient care coordination information and services.   Encounter Outcome:  No Answer  Care Coordination Interventions Activated:  No   Care Coordination Interventions:  No, not indicated    Emelia Loron RN, BSN [place] [phone] [email or web address]

## 2022-02-18 ENCOUNTER — Other Ambulatory Visit (HOSPITAL_COMMUNITY): Payer: Self-pay

## 2022-02-22 ENCOUNTER — Other Ambulatory Visit (HOSPITAL_COMMUNITY): Payer: Self-pay

## 2022-03-11 ENCOUNTER — Other Ambulatory Visit: Payer: Self-pay | Admitting: "Endocrinology

## 2022-03-21 ENCOUNTER — Other Ambulatory Visit (HOSPITAL_COMMUNITY): Payer: Self-pay

## 2022-03-21 ENCOUNTER — Other Ambulatory Visit: Payer: Self-pay | Admitting: "Endocrinology

## 2022-03-22 ENCOUNTER — Other Ambulatory Visit (HOSPITAL_COMMUNITY): Payer: Self-pay

## 2022-03-22 MED ORDER — TIROSINT 50 MCG PO CAPS
50.0000 ug | ORAL_CAPSULE | Freq: Every day | ORAL | 2 refills | Status: DC
  Filled 2022-03-22: qty 30, 30d supply, fill #0

## 2022-03-23 ENCOUNTER — Other Ambulatory Visit (HOSPITAL_COMMUNITY): Payer: Self-pay

## 2022-03-24 DIAGNOSIS — E038 Other specified hypothyroidism: Secondary | ICD-10-CM | POA: Diagnosis not present

## 2022-03-24 DIAGNOSIS — E782 Mixed hyperlipidemia: Secondary | ICD-10-CM | POA: Diagnosis not present

## 2022-03-25 LAB — LIPID PANEL
Chol/HDL Ratio: 2.5 ratio (ref 0.0–4.4)
Cholesterol, Total: 180 mg/dL (ref 100–199)
HDL: 72 mg/dL (ref >39)
LDL Chol Calc (NIH): 98 mg/dL (ref 0–99)
Triglycerides: 52 mg/dL (ref 0–149)
VLDL Cholesterol Cal: 10 mg/dL (ref 5–40)

## 2022-03-25 LAB — T4, FREE: Free T4: 1.75 ng/dL (ref 0.82–1.77)

## 2022-03-25 LAB — TSH: TSH: 5.96 u[IU]/mL — ABNORMAL HIGH (ref 0.450–4.500)

## 2022-03-31 ENCOUNTER — Encounter: Payer: Self-pay | Admitting: "Endocrinology

## 2022-03-31 ENCOUNTER — Ambulatory Visit (INDEPENDENT_AMBULATORY_CARE_PROVIDER_SITE_OTHER): Payer: Medicare Other | Admitting: "Endocrinology

## 2022-03-31 ENCOUNTER — Other Ambulatory Visit (HOSPITAL_COMMUNITY): Payer: Self-pay

## 2022-03-31 VITALS — BP 132/78 | HR 72 | Ht 65.0 in | Wt 149.8 lb

## 2022-03-31 DIAGNOSIS — I10 Essential (primary) hypertension: Secondary | ICD-10-CM

## 2022-03-31 DIAGNOSIS — R7303 Prediabetes: Secondary | ICD-10-CM | POA: Diagnosis not present

## 2022-03-31 DIAGNOSIS — E038 Other specified hypothyroidism: Secondary | ICD-10-CM

## 2022-03-31 DIAGNOSIS — E782 Mixed hyperlipidemia: Secondary | ICD-10-CM

## 2022-03-31 MED ORDER — TIROSINT 50 MCG PO CAPS
50.0000 ug | ORAL_CAPSULE | Freq: Every day | ORAL | 1 refills | Status: DC
  Filled 2022-03-31 – 2022-04-18 (×2): qty 90, 90d supply, fill #0
  Filled 2022-07-16: qty 90, 90d supply, fill #1

## 2022-03-31 NOTE — Progress Notes (Signed)
03/31/2022        Endocrinology Follow Up Visit   Subjective:    Patient ID: Julie May, female    DOB: [birth year], PCP Fayrene Helper, MD   Past Medical History:  Diagnosis Date   Bilateral impacted cerumen 04/10/2019   Callus of foot 11/07/2017   Foot pain, bilateral 05/23/2013   Hyperlipidemia    Hyperpotassemia    Hypertension    new dx 11/12/2010   Hypothyroidism    OA (osteoarthritis)    Other abnormal glucose    Overweight(278.02)    Polycythemia, secondary    Routine general medical examination at a health care facility    THYROID NODULE 01/18/2010   Qualifier: Diagnosis of  By: Loanne Drilling MD, Sean A    Trigger finger (acquired)    Unspecified disorder of thyroid    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 Unice [facility]ine   TONSILECTOMY, ADENOIDECTOMY, BILATERAL MYRINGOTOMY AND TUBES     TONSILLECTOMY     uterine polyp removal      Social History   Socioeconomic History   Marital status: Married    Spouse name: [name]   Number of children: 0   Years of education: Not on file   Highest education level: Not on file  Occupational History   Occupation: Clinical cytogeneticist   Tobacco Use   Smoking status: Never   Smokeless tobacco: Never  Vaping Use   Vaping Use: Never used  Substance and Sexual Activity   Alcohol use: Yes    Comment: occ.   Drug use: No   Sexual activity: Yes    Birth control/protection: Post-menopausal  Other Topics Concern   Not on file  Social History Narrative   Married x 36 years 2022.   Social Determinants of Health   Financial Resource Strain: Low Risk  (04/28/2021)   Overall Financial Resource Strain (CARDIA)    Difficulty of Paying Living Expenses: Not hard at all  Food Insecurity: No  Food Insecurity (04/28/2021)   Hunger Vital Sign    Worried About Running Out of Food in the Last Year: Never true    Ran Out of Food in the Last Year: Never true  Transportation Needs: No Transportation Needs (04/28/2021)   PRAPARE - Hydrologist (Medical): No    Lack of Transportation (Non-Medical): No  Physical Activity: Sufficiently Active (04/28/2021)   Exercise Vital Sign    Days of Exercise per Week: 5 days    Minutes of Exercise per Session: 30 min  Stress: No Stress Concern Present (04/28/2021)   [place]    Feeling of Stress : Not at all  Social Connections: [place] (04/28/2021)   Social Connection and Isolation Panel [NHANES]    Frequency of Communication with Friends and Family: More than three times a week    Frequency of Social Gatherings with Friends and Family: More than three times a week    Attends Religious Services: More than 4 times per year    Active Member of [employer] or Organizations: Yes    Attends Music therapist: More than 4 times per year    Marital Status: Married   Outpatient Encounter Medications as  of 03/31/2022  Medication Sig   Cholecalciferol (VITAMIN D) 2000 units CAPS Take 2,000 Units by mouth daily with lunch.    losartan (COZAAR) 25 MG tablet TAKE 1 TABLET(25 MG) BY MOUTH DAILY   TIROSINT 50 MCG CAPS Take 1 capsule (50 mcg total) by mouth daily before breakfast.   [DISCONTINUED] TIROSINT 50 MCG CAPS Take 1 capsule (50 mcg total) by mouth daily before breakfast.   No facility-administered encounter medications on file as of 03/31/2022.   ALLERGIES: No Known Allergies VACCINATION STATUS: Immunization History  Administered Date(s) Administered   Fluad Quad(high Dose 65+) 04/10/2019, 04/13/2020, 04/20/2021   Influenza Split 06/22/2011   Influenza Whole 05/18/2006, 04/30/2007, 04/14/2008   Influenza,inj,Quad PF,6+ Mos  05/31/2013, 05/28/2014, 04/14/2015, 04/27/2016, 05/16/2017, 04/13/2018   Moderna SARS-COV2 Booster Vaccination 04/29/2021   Moderna Sars-Covid-2 Vaccination 08/23/2019, 09/20/2019, 05/23/2020, 10/30/2020   Pneumococcal Conjugate-13 09/14/2018   Td 08/13/2009   Zoster Recombinat (Shingrix) 03/30/2021, 08/12/2021   Zoster, Live 03/23/2011    Thyroid Problem Presents for follow-up visit. Symptoms include tremors. Patient reports no anxiety, cold intolerance, constipation, depressed mood, diarrhea, fatigue, heat intolerance, leg swelling, palpitations, weight gain or weight loss. The symptoms have been stable.   Hypertension: New diagnosis.  She is not on treatment. Hyperlipidemia: Not on treatment.  Patient wishes to avoid statins. - Review of systems  Constitutional: + Minimally fluctuating body weight.,  current Body mass index is 24.93 kg/m. , no fatigue, no subjective hyperthermia, no subjective hypothermia    Objective:    BP 132/78   Pulse 72   Ht '5\' 5"'$  (1.651 m)   Wt 149 lb 12.8 oz (67.9 kg)   BMI 24.93 kg/m   Wt Readings from Last 3 Encounters:  03/31/22 149 lb 12.8 oz (67.9 kg)  12/28/21 150 lb 12.8 oz (68.4 kg)  08/18/21 153 lb 1.9 oz (69.5 kg)    BP Readings from Last 3 Encounters:  03/31/22 132/78  12/28/21 120/72  08/18/21 (!) 144/80    Physical Exam- Limited  Constitutional:  Body mass index is 24.93 kg/m. , not in acute distress, anxious state of mind Eyes:  EOMI, no exophthalmos Neck: Supple, palpable thyroid.  CMP     Component Value Date/Time   NA 134 02/11/2021 1456   K 5.0 02/11/2021 1456   CL 96 02/11/2021 1456   CO2 24 02/11/2021 1456   GLUCOSE 95 02/11/2021 1456   GLUCOSE 102 (H) 05/11/2020 0000   BUN 10 02/11/2021 1456   CREATININE 0.69 02/11/2021 1456   CREATININE 0.80 05/11/2020 0000   CALCIUM 9.4 02/11/2021 1456   PROT 7.3 05/11/2020 0000   ALBUMIN 4.3 04/03/2019 0934   AST 22 05/11/2020 0000   ALT 16 05/11/2020 0000   ALKPHOS  82 04/03/2019 0934   BILITOT 0.5 05/11/2020 0000   GFRNONAA 75 05/11/2020 0000   GFRAA 87 05/11/2020 0000     Diabetic Labs (most recent): Lab Results  Component Value Date   HGBA1C 5.5 (A) 06/25/2021   HGBA1C 5.7 (H) 05/11/2020   HGBA1C 5.8 (H) 04/03/2019      Lipid Panel     Component Value Date/Time   CHOL 180 03/24/2022 0924   TRIG 52 03/24/2022 0924   HDL 72 03/24/2022 0924   CHOLHDL 2.5 03/24/2022 0924   CHOLHDL 2.5 05/11/2020 0000   VLDL 10 04/03/2019 0934   LDLCALC 98 03/24/2022 0924   LDLCALC 110 (H) 05/11/2020 0000   Recent Results (from the past 2160 hour(s))  TSH  Status: Abnormal   Collection Time: 03/24/22  9:24 AM  Result Value Ref Range   TSH 5.960 (H) 0.450 - 4.500 uIU/mL  T4, free     Status: None   Collection Time: 03/24/22  9:24 AM  Result Value Ref Range   Free T4 1.75 0.82 - 1.77 ng/dL  Lipid panel     Status: None   Collection Time: 03/24/22  9:24 AM  Result Value Ref Range   Cholesterol, Total 180 100 - 199 mg/dL   Triglycerides 52 0 - 149 mg/dL   HDL 72 >39 mg/dL   VLDL Cholesterol Cal 10 5 - 40 mg/dL   LDL Chol Calc (NIH) 98 0 - 99 mg/dL   Chol/HDL Ratio 2.5 0.0 - 4.4 ratio    Comment:                                   T. Chol/HDL Ratio                                             Men  Women                               1/2 Avg.Risk  3.4    3.3                                   Avg.Risk  5.0    4.4                                2X Avg.Risk  9.6    7.1                                3X Avg.Risk 23.4   11.0      Assessment & Plan:   1.  Primary hypothyroidism -Her previsit thyroid function tests are consistent with appropriate replacement.  She is advised to continue Tirosint 50 mcg p.o. daily before breakfast.     - We discussed about the correct intake of her thyroid hormone, on empty stomach at fasting, with water, separated by at least 30 minutes from breakfast and other medications,  and separated by more than 4 hours from  calcium, iron, multivitamins, acid reflux medications (PPIs). -Patient is made aware of the fact that thyroid hormone replacement is needed for life, dose to be adjusted by periodic monitoring of thyroid function tests.   -She did not tolerate levothyroxine or Synthroid in the past.    2. Nontoxic multinodular goiter Her last ultrasound from 2017 was unchanged from previous studies, showing no significant findings.  She will be considered for 1 more thyroid ultrasound for surveillance before her next visit.   3.  Hypertension Patient was observed to have significantly elevated blood pressure on several instances.  She generally dislikes medications , she excepted a low-dose losartan 25 mg p.o. daily.  She is responding to this treatment, advised to continue.     4) Hyperlipidemia: she does not like to take statins.  Her LDL is 98, improving from 112.  She  started to benefit from whole food plant-based diet.   - she acknowledges that there is a room for improvement in her food and drink choices. - Suggestion is made for her to avoid simple carbohydrates  from her diet including Cakes, Sweet Desserts, Ice Cream, Soda (diet and regular), Sweet Tea, Candies, Chips, Cookies, Store Bought Juices, Alcohol , Artificial Sweeteners,  Coffee Creamer, and "Sugar-free" Products, Lemonade. This will help patient to have more stable blood glucose profile and potentially avoid unintended weight gain.  The following Lifestyle Medicine recommendations according to [place]  [facility]) were discussed and and offered to patient and she  agrees to start the journey:  A. Whole Foods, Plant-Based Nutrition comprising of fruits and vegetables, plant-based proteins, whole-grain carbohydrates was discussed in detail with the patient.   A list for source of those nutrients were also provided to the patient.  Patient will use only water or unsweetened tea for hydration. B.  The need to stay away  from risky substances including alcohol, smoking; obtaining 7 to 9 hours of restorative sleep, at least 150 minutes of moderate intensity exercise weekly, the importance of healthy social connections,  and stress management techniques were discussed. C.  A full color page of  Calorie density of various food groups per pound showing examples of each food groups was provided to the patient.   5) Prediabetes- resolved with ac of 5.5%.  She will have repeat point-of-care A1c during her next visit.      - I advised patient to maintain close follow up with Fayrene Helper, MD for primary care needs.   I spent 31 minutes in the care of the patient today including review of labs from Thyroid Function, CMP, and other relevant labs ; imaging/biopsy records (current and previous including abstractions from other facilities); face-to-face time discussing  her lab results and symptoms, medications doses, her options of short and long term treatment based on the latest standards of care / guidelines;   and documenting the encounter.  [name]  participated in the discussions, expressed understanding, and voiced agreement with the above plans.  All questions were answered to her satisfaction. she is encouraged to contact clinic should she have any questions or concerns prior to her return visit.   Follow up plan: Return in about 6 months (around 09/29/2022) for F/U with Pre-visit Labs, A1c -NV.  Rayetta Pigg, [facility] [address] [place], [place] [address] Phone: [phone] Fax: [phone]  03/31/2022, 11:05 AM

## 2022-04-04 DIAGNOSIS — D225 Melanocytic nevi of trunk: Secondary | ICD-10-CM | POA: Diagnosis not present

## 2022-04-04 DIAGNOSIS — S20462A Insect bite (nonvenomous) of left back wall of thorax, initial encounter: Secondary | ICD-10-CM | POA: Diagnosis not present

## 2022-04-18 ENCOUNTER — Other Ambulatory Visit (HOSPITAL_COMMUNITY): Payer: Self-pay

## 2022-04-18 ENCOUNTER — Other Ambulatory Visit: Payer: Self-pay | Admitting: *Deleted

## 2022-04-18 NOTE — Patient Outreach (Signed)
  Care Coordination   04/18/2022  Name: Julie May MRN: [id number] DOB: 1950/09/25   Care Coordination Outreach Attempts:  A second unsuccessful outreach was attempted today to offer the patient with information about available care coordination services as a benefit of their health plan.   HIPAA compliant messages left on voicemail, providing contact information for CSW, encouraging patient to return CSW's call at her earliest convenience.   Follow Up Plan:  Additional outreach attempts will be made to offer the patient care coordination information and services.    Encounter Outcome:  No Answer.    Care Coordination Interventions Activated:  No.     Care Coordination Interventions:  No, not indicated.     Nat Christen, BSW, MSW, LCSW  Licensed Education officer, [facility]  Mailing [place] [address], [place], [place] [address] Physical Address-[address], [place], [place] [address] Toll Free Main # [phone] Fax # [phone] Cell # [phone] [email or web address]

## 2022-04-19 ENCOUNTER — Other Ambulatory Visit (HOSPITAL_COMMUNITY): Payer: Self-pay

## 2022-04-22 ENCOUNTER — Telehealth: Payer: Self-pay | Admitting: *Deleted

## 2022-04-22 NOTE — Patient Outreach (Signed)
  Care Coordination   04/22/2022 Name: Julie May MRN: [id number] DOB: May 23, 1951   Care Coordination Outreach Attempts:  A third unsuccessful outreach was attempted today to offer the patient with information about available care coordination services as a benefit of their health plan.  NP sent contact information via text.  Follow Up Plan:  No further outreach attempts will be made at this time. We have been unable to contact the patient to offer or enroll patient in care coordination services  Encounter Outcome:  No Answer  Care Coordination Interventions Activated:  No   Care Coordination Interventions:  No, not indicated    SIG Fareed Fung C. Myrtie Neither, MSN, [facility] Gerontological Nurse Practitioner [facility] Care Management [phone]

## 2022-04-27 ENCOUNTER — Encounter: Payer: Self-pay | Admitting: Family Medicine

## 2022-04-27 ENCOUNTER — Ambulatory Visit (INDEPENDENT_AMBULATORY_CARE_PROVIDER_SITE_OTHER): Payer: Medicare Other | Admitting: Family Medicine

## 2022-04-27 VITALS — BP 130/84 | HR 79 | Resp 14 | Ht 65.0 in | Wt 148.0 lb

## 2022-04-27 DIAGNOSIS — E782 Mixed hyperlipidemia: Secondary | ICD-10-CM

## 2022-04-27 DIAGNOSIS — Z23 Encounter for immunization: Secondary | ICD-10-CM | POA: Diagnosis not present

## 2022-04-27 DIAGNOSIS — Z1231 Encounter for screening mammogram for malignant neoplasm of breast: Secondary | ICD-10-CM | POA: Diagnosis not present

## 2022-04-27 DIAGNOSIS — E559 Vitamin D deficiency, unspecified: Secondary | ICD-10-CM

## 2022-04-27 DIAGNOSIS — I10 Essential (primary) hypertension: Secondary | ICD-10-CM | POA: Diagnosis not present

## 2022-04-27 DIAGNOSIS — E038 Other specified hypothyroidism: Secondary | ICD-10-CM | POA: Diagnosis not present

## 2022-04-27 NOTE — Patient Instructions (Addendum)
F/u in 6 months, call if you need me sooner  Congrats on lifestyle change with excellent results!  NO cologuard needed, most recent was in 2022  CBC, chem 7 and EGFR today and vit D  Flu vaccine today  Please get covid vaccine  In next 1 to 2 weeks (pharmacy)  Need Pneumonia 20 vaccine, you Julie May get this here, just schedule appointment if you wish for nurse visit  Blood pressure is good and your home cuff is reliable BP log card, please give her another one   Please sched mammogram at checkout  Thanks for choosing [facility], we consider it a privelige to serve you.

## 2022-04-29 LAB — CBC
Hematocrit: 42.8 % (ref 34.0–46.6)
Hemoglobin: 14.6 g/dL (ref 11.1–15.9)
MCH: 28.6 pg (ref 26.6–33.0)
MCHC: 34.1 g/dL (ref 31.5–35.7)
MCV: 84 fL (ref 79–97)
Platelets: 283 10*3/uL (ref 150–450)
RBC: 5.11 x10E6/uL (ref 3.77–5.28)
RDW: 12.2 % (ref 11.7–15.4)
WBC: 4.7 10*3/uL (ref 3.4–10.8)

## 2022-04-29 LAB — BMP8+EGFR
BUN/Creatinine Ratio: 13 (ref 12–28)
BUN: 10 mg/dL (ref 8–27)
CO2: 22 mmol/L (ref 20–29)
Calcium: 9.2 mg/dL (ref 8.7–10.3)
Chloride: 95 mmol/L — ABNORMAL LOW (ref 96–106)
Creatinine, Ser: 0.76 mg/dL (ref 0.57–1.00)
Glucose: 89 mg/dL (ref 70–99)
Potassium: 4.9 mmol/L (ref 3.5–5.2)
Sodium: 132 mmol/L — ABNORMAL LOW (ref 134–144)
eGFR: 83 mL/min/{1.73_m2} (ref >59)

## 2022-04-29 LAB — VITAMIN D 25 HYDROXY (VIT D DEFICIENCY, FRACTURES): Vit D, 25-Hydroxy: 44.4 ng/mL (ref 30.0–100.0)

## 2022-05-01 ENCOUNTER — Encounter: Payer: Self-pay | Admitting: Family Medicine

## 2022-05-01 NOTE — Assessment & Plan Note (Signed)
Controlled, no change in medication DASH diet and commitment to daily physical activity for a minimum of 30 minutes discussed and encouraged, as a part of hypertension management. The importance of attaining a healthy weight is also discussed.     04/27/2022   10:22 AM 03/31/2022    9:56 AM 12/28/2021    9:30 AM 08/18/2021   10:59 AM 08/18/2021   10:21 AM 06/25/2021    9:32 AM 04/28/2021    1:36 PM  BP/Weight  Systolic BP 500 370 488 891 [phone]  Diastolic BP 84 78 72 80 78 78 89  Wt. (Lbs) 148.04 149.8 150.8  153.12 153.8 153  BMI 24.64 kg/m2 24.93 kg/m2 25.09 kg/m2  25.48 kg/m2 25.59 kg/m2 25.46 kg/m2

## 2022-05-01 NOTE — Assessment & Plan Note (Signed)
managd by endo, controlled on current med dose

## 2022-05-01 NOTE — Assessment & Plan Note (Signed)
Improved  Patient re-educated about  the importance of commitment to a  minimum of 150 minutes of exercise per week as able.  The importance of healthy food choices with portion control discussed, as well as eating regularly and within a 12 hour window most days. The need to choose "clean , green" food 50 to 75% of the time is discussed, as well as to make water the primary drink and set a goal of 64 ounces water daily.       04/27/2022   10:22 AM 03/31/2022    9:56 AM 12/28/2021    9:30 AM  Weight /BMI  Weight 148 lb 0.6 oz 149 lb 12.8 oz 150 lb 12.8 oz  Height '5\' 5"'$  (1.651 m) '5\' 5"'$  (1.651 m) '5\' 5"'$  (1.651 m)  BMI 24.64 kg/m2 24.93 kg/m2 25.09 kg/m2

## 2022-05-01 NOTE — Assessment & Plan Note (Signed)
Hyperlipidemia:Low fat diet discussed and encouraged.   Lipid Panel  Lab Results  Component Value Date   CHOL 180 03/24/2022   HDL 72 03/24/2022   LDLCALC 98 03/24/2022   TRIG 52 03/24/2022   CHOLHDL 2.5 03/24/2022

## 2022-05-01 NOTE — Progress Notes (Signed)
Julie May     MRN: [id number]      DOB: [birth year]   HPI Julie May is here for follow up and re-evaluation of chronic medical conditions, medication management and review of any available recent lab and radiology data.  Preventive health is updated, specifically  Cancer screening and Immunization.   Questions or concerns regarding consultations or procedures which the PT has had in the interim are  addressed. The PT denies any adverse reactions to current medications since the last visit.  There are no new concerns.  There are no specific complaints   ROS Denies recent fever or chills. Denies sinus pressure, nasal congestion, ear pain or sore throat. Denies chest congestion, productive cough or wheezing. Denies chest pains, palpitations and leg swelling Denies abdominal pain, nausea, vomiting,diarrhea or constipation.   Denies dysuria, frequency, hesitancy or incontinence. Denies joint pain, swelling and limitation in mobility. Denies headaches, seizures, numbness, or tingling. Denies depression, anxiety or insomnia. Denies skin break down or rash.   PE  BP 130/84 (BP Location: Right Arm)   Pulse 79   Resp 14   Ht '5\' 5"'$  (1.651 m)   Wt 148 lb 0.6 oz (67.2 kg)   SpO2 95%   BMI 24.64 kg/m   Patient alert and oriented and in no cardiopulmonary distress.  HEENT: No facial asymmetry, EOMI,     Neck supple .  Chest: Clear to auscultation bilaterally.  CVS: S1, S2 no murmurs, no S3.Regular rate.  ABD: Soft non tender.   Ext: No edema  MS: Adequate ROM spine, shoulders, hips and knees.  Skin: Intact, no ulcerations or rash noted.  Psych: Good eye contact, normal affect. Memory intact not anxious or depressed appearing.  CNS: CN 2-12 intact, power,  normal throughout.no focal deficits noted.   Assessment & Plan  Essential hypertension, benign Controlled, no change in medication DASH diet and commitment to daily physical activity for a minimum of 30 minutes  discussed and encouraged, as a part of hypertension management. The importance of attaining a healthy weight is also discussed.     04/27/2022   10:22 AM 03/31/2022    9:56 AM 12/28/2021    9:30 AM 08/18/2021   10:59 AM 08/18/2021   10:21 AM 06/25/2021    9:32 AM 04/28/2021    1:36 PM  BP/Weight  Systolic BP 193 790 240 [phone] 426  Diastolic BP 84 78 72 80 78 78 89  Wt. (Lbs) 148.04 149.8 150.8  153.12 153.8 153  BMI 24.64 kg/m2 24.93 kg/m2 25.09 kg/m2  25.48 kg/m2 25.59 kg/m2 25.46 kg/m2       Hypothyroidism managd by endo, controlled on current med dose  Mixed hyperlipidemia Hyperlipidemia:Low fat diet discussed and encouraged.   Lipid Panel  Lab Results  Component Value Date   CHOL 180 03/24/2022   HDL 72 03/24/2022   LDLCALC 98 03/24/2022   TRIG 52 03/24/2022   CHOLHDL 2.5 03/24/2022       Overweight Improved  Patient re-educated about  the importance of commitment to a  minimum of 150 minutes of exercise per week as able.  The importance of healthy food choices with portion control discussed, as well as eating regularly and within a 12 hour window most days. The need to choose "clean , green" food 50 to 75% of the time is discussed, as well as to make water the primary drink and set a goal of 64 ounces water daily.  04/27/2022   10:22 AM 03/31/2022    9:56 AM 12/28/2021    9:30 AM  Weight /BMI  Weight 148 lb 0.6 oz 149 lb 12.8 oz 150 lb 12.8 oz  Height '5\' 5"'$  (1.651 m) '5\' 5"'$  (1.651 m) '5\' 5"'$  (1.651 m)  BMI 24.64 kg/m2 24.93 kg/m2 25.09 kg/m2

## 2022-05-02 ENCOUNTER — Ambulatory Visit (INDEPENDENT_AMBULATORY_CARE_PROVIDER_SITE_OTHER): Payer: Medicare Other | Admitting: Internal Medicine

## 2022-05-02 ENCOUNTER — Encounter: Payer: Self-pay | Admitting: Internal Medicine

## 2022-05-02 DIAGNOSIS — Z0000 Encounter for general adult medical examination without abnormal findings: Secondary | ICD-10-CM | POA: Diagnosis not present

## 2022-05-02 NOTE — Progress Notes (Signed)
Subjective:   This is a telephone encounter between Julie May and Julie May on 05/02/2022 for AWV. The visit was conducted with the patient located at home and Julie May at [facility]. The patient's identity was confirmed using their DOB and current address. The patient has consented to being evaluated through a telephone encounter and understands the associated risks (an examination cannot be done and the patient may need to come in for an appointment) / benefits (allows the patient to remain at home, decreasing exposure to coronavirus).      Julie May is a 71 y.o. female who presents for Medicare Annual (Subsequent) preventive examination.  Review of Systems    Review of Systems  All other systems reviewed and are negative.    Objective:    There were no vitals filed for this visit. There is no height or weight on file to calculate BMI.     05/02/2022    2:46 PM 04/28/2021    1:52 PM 04/16/2020    1:38 PM 01/30/2020    8:00 AM 01/24/2020    8:11 AM 10/08/2019    9:27 AM 03/30/2016    9:26 AM  Advanced Directives  Does Patient Have a Medical Advance Directive? Yes No No No No No No  Does patient want to make changes to medical advance directive? Yes (MAU/Ambulatory/Procedural Areas - Information given)        Would patient like information on creating a medical advance directive?  No - Patient declined No - Patient declined No - Patient declined No - Patient declined No - Patient declined No - patient declined information    Current Medications (verified) Outpatient Encounter Medications as of 05/02/2022  Medication Sig   Cholecalciferol (VITAMIN D) 2000 units CAPS Take 2,000 Units by mouth daily with lunch.    losartan (COZAAR) 25 MG tablet TAKE 1 TABLET(25 MG) BY MOUTH DAILY   TIROSINT 50 MCG CAPS Take 1 capsule (50 mcg total) by mouth daily before breakfast.   No facility-administered encounter medications on file as of 05/02/2022.    Allergies (verified) Patient  has no known allergies.   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 Unice Cobble Spine   TONSILECTOMY, ADENOIDECTOMY, BILATERAL MYRINGOTOMY AND TUBES     TONSILLECTOMY     uterine polyp removal      Family History  Problem Relation Age of Onset   Heart attack Father    Stroke Mother    Hypertension Mother    Thyroid disease Sister    Hyperlipidemia Brother    Heart attack Brother    Heart disease Brother  Arthritis Other        family history    Hypertension Other        family history    Stroke Other        family history    Heart disease Other        family history    Social History   Socioeconomic History   Marital status: Married    Spouse name: Julie May   Number of children: 0   Years of education: Not on file   Highest education level: Not on file  Occupational History   Occupation: Clinical cytogeneticist   Tobacco Use   Smoking status: Never   Smokeless tobacco: Never  Vaping Use   Vaping Use: Never used  Substance and Sexual Activity   Alcohol use: Yes    Comment: occ.   Drug use: No   Sexual activity: Yes    Birth control/protection: Post-menopausal  Other Topics Concern   Not  on file  Social History Narrative   Married x 36 years 2022.   Social Determinants of Health   Financial Resource Strain: Low Risk  (04/28/2021)   Overall Financial Resource Strain (CARDIA)    Difficulty of Paying Living Expenses: Not hard at all  Food Insecurity: No Food Insecurity (04/28/2021)   Hunger Vital Sign    Worried About Running Out of Food in the Last Year: Never true    Ran Out of Food in the Last Year: Never true  Transportation Needs: No Transportation Needs (04/28/2021)   PRAPARE - Hydrologist (Medical): No    Lack of Transportation (Non-Medical): No  Physical Activity: Sufficiently Active (04/28/2021)   Exercise Vital Sign    Days of Exercise per Week: 5 days    Minutes of Exercise per Session: 30 min  Stress: No Stress Concern Present (04/28/2021)   [place]    Feeling of Stress : Not at all  Social Connections: [place] (04/28/2021)   Social Connection and Isolation Panel [NHANES]    Frequency of Communication with Friends and Family: More than three times a week    Frequency of Social Gatherings with Friends and Family: More than three times a week    Attends Religious Services: More than 4 times per year    Active Member of [employer] or Organizations: Yes    Attends Music therapist: More than 4 times per year    Marital Status: Married    Tobacco Counseling Counseling given: Not Answered   Clinical Intake:  Pre-visit preparation completed: Yes  Pain : No/denies pain     Nutritional Status: BMI of 19-24  Normal Diabetes: No  How often do you need to have someone help you when you read instructions, pamphlets, or other written materials from your doctor or pharmacy?: 1 - Never What is the last grade level you completed in school?: [employer] 4 years, and additional courses  Diabetic?NO         Activities of Daily Living     05/02/2022    2:52 PM 05/02/2022    8:38 AM  In your present state of health, do you have any difficulty performing the following activities:  Hearing? 0 0  Vision? 0 0  Difficulty concentrating or making decisions? 0 0  Walking or climbing stairs? 0 0  Dressing or bathing? 0 0  Doing errands, shopping? 0 0  Preparing Food and eating ?  N  Using  the Toilet?  N  In the past six months, have you accidently leaked urine?  N  Do you have problems with loss of bowel control?  N  Managing your Medications?  N  Managing your Finances?  N  Housekeeping or managing your Housekeeping?  N    Patient Care Team: Fayrene Helper, MD as PCP - General  Indicate any recent Medical Services you may have received from other than [facility] providers in the past year (date may be approximate).     Assessment:   This is a routine wellness examination for [facility].  Hearing/Vision screen No results found.  Dietary issues and exercise activities discussed:     Goals Addressed   None    Depression Screen    05/02/2022    2:47 PM 08/18/2021   10:22 AM 04/28/2021    1:47 PM 02/11/2021    2:10 PM 07/23/2020    8:43 AM 04/16/2020    1:39 PM 04/16/2020    1:36 PM  PHQ 2/9 Scores  PHQ - 2 Score 0 0 0 0 0 0 0    Fall Risk    05/02/2022    2:47 PM 05/02/2022    8:38 AM 04/29/2022    4:54 PM 08/18/2021   10:22 AM 04/28/2021    1:53 PM  Fall Risk   Falls in the past year? 0 0 0 1 1  Number falls in past yr: 0 0 0 0 0  Injury with Fall? 0 0 0 0 0  Risk for fall due to :    History of fall(s) Impaired balance/gait  Follow up    Falls evaluation completed Falls prevention discussed    FALL RISK PREVENTION PERTAINING TO THE HOME:  Any stairs in or around the home? Yes  If so, are there any without handrails? Yes  Home free of loose throw rugs in walkways, pet beds, electrical cords, etc? No  Adequate lighting in your home to reduce risk of falls? Yes   ASSISTIVE DEVICES UTILIZED TO PREVENT  FALLS:  Life alert? No  Use of a cane, walker or w/c? No  Grab bars in the bathroom? Yes  Shower chair or bench in shower? Yes  Elevated toilet seat or a handicapped toilet? No     Cognitive Function:        05/02/2022    2:48 PM 04/28/2021    1:58 PM 04/16/2020    1:39 PM 04/16/2019   11:41 AM  6CIT Screen  What Year? 0 points 0 points 0 points 0 points  What month? 0 points 0 points 0 points 0 points  What time? 0 points 0 points 0 points 0 points  Count back from 20 0 points 0 points 0 points 0 points  Months in reverse 0 points 0 points 0 points 0 points  Repeat phrase 0 points 0 points 0 points 0 points  Total Score 0 points 0 points 0 points 0 points    Immunizations Immunization History  Administered Date(s) Administered   Fluad Quad(high Dose 65+) 04/10/2019, 04/13/2020, 04/20/2021, 04/27/2022   Influenza Split 06/22/2011   Influenza Whole 05/18/2006, 04/30/2007, 04/14/2008   Influenza,inj,Quad PF,6+ Mos 05/31/2013, 05/28/2014, 04/14/2015, 04/27/2016, 05/16/2017, 04/13/2018   Moderna SARS-COV2 Booster Vaccination 04/29/2021   Moderna Sars-Covid-2 Vaccination 08/23/2019, 09/20/2019, 05/23/2020, 10/30/2020   Pneumococcal Conjugate-13 09/14/2018   Td 08/13/2009   Zoster Recombinat (Shingrix) 03/30/2021, 08/12/2021   Zoster, Live 03/23/2011    TDAP status: Up to date  Flu Vaccine status: Up  to date  Pneumococcal vaccine status: Due, Education has been provided regarding the importance of this vaccine. Advised may receive this vaccine at local pharmacy or Health Dept. Aware to provide a copy of the vaccination record if obtained from local pharmacy or Health Dept. Verbalized acceptance and understanding.  Covid-19 vaccine status: Information provided on how to obtain vaccines.   Qualifies for Shingles Vaccine? Yes   Zostavax completed No   Shingrix Completed?: Yes  Screening Tests Health Maintenance  Topic Date Due   TETANUS/TDAP  08/14/2019   Pneumonia  Vaccine 7+ Years old (2 - PPSV23 or PCV20) 09/14/2019   COVID-19 Vaccine (5 - Moderna series) 06/24/2021   MAMMOGRAM  07/08/2023   COLONOSCOPY (Pts 45-63yr Insurance coverage will need to be confirmed)  03/30/2026   INFLUENZA VACCINE  Completed   DEXA SCAN  Completed   Hepatitis C Screening  Completed   Zoster Vaccines- Shingrix  Completed   HPV VACCINES  Aged Out    Health Maintenance  Health Maintenance Due  Topic Date Due   TETANUS/TDAP  08/14/2019   Pneumonia Vaccine 72 Years old (2 - PPSV23 or PCV20) 09/14/2019   COVID-19 Vaccine (5 - Moderna series) 06/24/2021    Colorectal cancer screening: Type of screening: Colonoscopy. Completed 03/30/2016. Repeat every 10 years  Mammogram status: Ordered 07/08/2022. Pt provided with contact info and advised to call to schedule appt.   Bone Density status: Completed 05/30/2022. Results reflect: Bone density results: OSTEOPENIA.   Lung Cancer Screening: (Low Dose CT Chest recommended if Age 72-80years, 30 pack-year currently smoking OR have quit w/in 15years.) does not qualify.     Additional Screening:  Hepatitis C Screening: does not qualify; Completed 10/11/2016  Vision Screening: Recommended annual ophthalmology exams for early detection of glaucoma and other disorders of the eye. Is the patient up to date with their annual eye exam?  Yes  Who is the provider or what is the name of the office in which the patient attends annual eye exams? [facility]If pt is not established with a provider, would they like to be referred to a provider to establish care? No .   Dental Screening: Recommended annual dental exams for proper oral hygiene  Community Resource Referral / Chronic Care Management: CRR required this visit?  No   CCM required this visit?  No      Plan:     I have personally reviewed and noted the following in the patient's chart:   Medical and social history Use of alcohol, tobacco or illicit drugs   Current medications and supplements including opioid prescriptions. Patient is not currently taking opioid prescriptions. Functional ability and status Nutritional status Physical activity Advanced directives List of other physicians Hospitalizations, surgeries, and ER visits in previous 12 months Vitals Screenings to include cognitive, depression, and falls Referrals and appointments  In addition, I have reviewed and discussed with patient certain preventive protocols, quality metrics, and best practice recommendations. A written personalized care plan for preventive services as well as general preventive health recommendations were provided to patient.     JLorene Dy MD   05/02/2022

## 2022-05-02 NOTE — Patient Instructions (Signed)
  Ms. Fayson , Thank you for taking time to come for your Medicare Wellness Visit. I appreciate your ongoing commitment to your health goals. Please review the following plan we discussed and let me know if I can assist you in the future.   These are the goals we discussed:Continue to maintain your weight , goal <150 lbs  This is a list of the screening recommended for you and due dates:  Health Maintenance  Topic Date Due   Tetanus Vaccine  08/14/2019   Pneumonia Vaccine (2 - PPSV23 or PCV20) 09/14/2019   COVID-19 Vaccine (5 - Moderna series) 06/24/2021   Mammogram  07/08/2023   Colon Cancer Screening  03/30/2026   Flu Shot  Completed   DEXA scan (bone density measurement)  Completed   Hepatitis C Screening: USPSTF Recommendation to screen - Ages 18-79 yo.  Completed   Zoster (Shingles) Vaccine  Completed   HPV Vaccine  Aged Out

## 2022-05-11 DIAGNOSIS — Z23 Encounter for immunization: Secondary | ICD-10-CM | POA: Diagnosis not present

## 2022-06-07 ENCOUNTER — Other Ambulatory Visit: Payer: Self-pay | Admitting: "Endocrinology

## 2022-06-14 ENCOUNTER — Encounter: Payer: Self-pay | Admitting: Family Medicine

## 2022-06-23 ENCOUNTER — Ambulatory Visit (INDEPENDENT_AMBULATORY_CARE_PROVIDER_SITE_OTHER): Payer: Medicare Other | Admitting: Family Medicine

## 2022-06-23 ENCOUNTER — Encounter: Payer: Self-pay | Admitting: Family Medicine

## 2022-06-23 VITALS — BP 127/79 | HR 72 | Ht 65.0 in | Wt 150.0 lb

## 2022-06-23 DIAGNOSIS — R928 Other abnormal and inconclusive findings on diagnostic imaging of breast: Secondary | ICD-10-CM | POA: Diagnosis not present

## 2022-06-23 DIAGNOSIS — H9192 Unspecified hearing loss, left ear: Secondary | ICD-10-CM

## 2022-06-23 NOTE — Progress Notes (Signed)
Ear examined before irrigation small amount of wax in outer canal right ear, tM not well seen, post irrigation, tM well visualized , no erythema and good light reflex Pt reported not hearing after flush from the right ear , and stated this had occurred about 10 days prior also and had "opened up" spontaneously. No visible traum to left ear/ eardrum, urgent appt with dr Benjamine Mola to be requested has appt 01/17 Right TM ansd ear exam normal

## 2022-07-08 ENCOUNTER — Ambulatory Visit (HOSPITAL_COMMUNITY)
Admission: RE | Admit: 2022-07-08 | Discharge: 2022-07-08 | Disposition: A | Discharge status: Home / Self Care | Payer: Medicare Other | Source: Ambulatory Visit | Attending: Family Medicine | Admitting: Family Medicine

## 2022-07-08 DIAGNOSIS — Z1231 Encounter for screening mammogram for malignant neoplasm of breast: Secondary | ICD-10-CM | POA: Insufficient documentation

## 2022-07-13 ENCOUNTER — Other Ambulatory Visit (HOSPITAL_COMMUNITY): Payer: Self-pay | Admitting: Family Medicine

## 2022-07-13 DIAGNOSIS — R928 Other abnormal and inconclusive findings on diagnostic imaging of breast: Secondary | ICD-10-CM

## 2022-07-14 ENCOUNTER — Ambulatory Visit (HOSPITAL_COMMUNITY)
Admission: RE | Admit: 2022-07-14 | Discharge: 2022-07-14 | Disposition: A | Discharge status: Home / Self Care | Payer: Medicare Other | Source: Ambulatory Visit | Attending: Family Medicine | Admitting: Family Medicine

## 2022-07-14 DIAGNOSIS — R928 Other abnormal and inconclusive findings on diagnostic imaging of breast: Secondary | ICD-10-CM

## 2022-07-14 DIAGNOSIS — N6324 Unspecified lump in the left breast, lower inner quadrant: Secondary | ICD-10-CM | POA: Diagnosis not present

## 2022-07-14 NOTE — Addendum Note (Signed)
Addended by: Smitty Knudsen on: 07/14/2022 03:37 PM   Modules accepted: Orders

## 2022-07-18 ENCOUNTER — Other Ambulatory Visit (HOSPITAL_COMMUNITY): Payer: Self-pay

## 2022-07-20 ENCOUNTER — Other Ambulatory Visit (HOSPITAL_COMMUNITY): Payer: Self-pay

## 2022-07-21 ENCOUNTER — Other Ambulatory Visit (HOSPITAL_COMMUNITY): Payer: Self-pay

## 2022-07-26 ENCOUNTER — Encounter: Payer: Self-pay | Admitting: "Endocrinology

## 2022-07-27 DIAGNOSIS — H6123 Impacted cerumen, bilateral: Secondary | ICD-10-CM | POA: Diagnosis not present

## 2022-07-27 DIAGNOSIS — H838X3 Other specified diseases of inner ear, bilateral: Secondary | ICD-10-CM | POA: Diagnosis not present

## 2022-07-27 DIAGNOSIS — H903 Sensorineural hearing loss, bilateral: Secondary | ICD-10-CM | POA: Diagnosis not present

## 2022-09-08 ENCOUNTER — Encounter: Payer: Self-pay | Admitting: Radiology

## 2022-09-08 ENCOUNTER — Other Ambulatory Visit: Payer: Self-pay | Admitting: "Endocrinology

## 2022-09-23 DIAGNOSIS — E782 Mixed hyperlipidemia: Secondary | ICD-10-CM | POA: Diagnosis not present

## 2022-09-23 DIAGNOSIS — E038 Other specified hypothyroidism: Secondary | ICD-10-CM | POA: Diagnosis not present

## 2022-09-24 LAB — LIPID PANEL
Chol/HDL Ratio: 2.7 ratio (ref 0.0–4.4)
Cholesterol, Total: 202 mg/dL — ABNORMAL HIGH (ref 100–199)
HDL: 75 mg/dL (ref >39)
LDL Chol Calc (NIH): 118 mg/dL — ABNORMAL HIGH (ref 0–99)
Triglycerides: 51 mg/dL (ref 0–149)
VLDL Cholesterol Cal: 9 mg/dL (ref 5–40)

## 2022-09-24 LAB — T4, FREE: Free T4: 1.76 ng/dL (ref 0.82–1.77)

## 2022-09-24 LAB — TSH: TSH: 3.14 u[IU]/mL (ref 0.450–4.500)

## 2022-09-27 ENCOUNTER — Encounter: Payer: Self-pay | Admitting: "Endocrinology

## 2022-09-29 ENCOUNTER — Encounter: Payer: Self-pay | Admitting: "Endocrinology

## 2022-09-29 ENCOUNTER — Ambulatory Visit (INDEPENDENT_AMBULATORY_CARE_PROVIDER_SITE_OTHER): Payer: Medicare Other | Admitting: "Endocrinology

## 2022-09-29 VITALS — BP 134/76 | HR 80 | Ht 65.0 in | Wt 148.2 lb

## 2022-09-29 DIAGNOSIS — E782 Mixed hyperlipidemia: Secondary | ICD-10-CM

## 2022-09-29 DIAGNOSIS — R7303 Prediabetes: Secondary | ICD-10-CM

## 2022-09-29 DIAGNOSIS — E038 Other specified hypothyroidism: Secondary | ICD-10-CM

## 2022-09-29 DIAGNOSIS — I10 Essential (primary) hypertension: Secondary | ICD-10-CM | POA: Diagnosis not present

## 2022-09-29 LAB — POCT GLYCOSYLATED HEMOGLOBIN (HGB A1C): HbA1c, POC (prediabetic range): 5.6 % — AB (ref 5.7–6.4)

## 2022-09-29 NOTE — Progress Notes (Signed)
09/29/2022        Endocrinology Follow Up Visit   Subjective:    Patient ID: Julie May, female    DOB: [birth year], PCP Fayrene Helper, MD   Past Medical History:  Diagnosis Date   Bilateral impacted cerumen 04/10/2019   Callus of foot 11/07/2017   Foot pain, bilateral 05/23/2013   Hyperlipidemia    Hyperpotassemia    Hypertension    new dx 11/12/2010   Hypothyroidism    OA (osteoarthritis)    Other abnormal glucose    Overweight(278.02)    Polycythemia, secondary    Routine general medical examination at a health care facility    THYROID NODULE 01/18/2010   Qualifier: Diagnosis of  By: Loanne Drilling MD, Sean A    Trigger finger (acquired)    Unspecified disorder of thyroid    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 Unice Cobble Spine   TONSILECTOMY, ADENOIDECTOMY, BILATERAL MYRINGOTOMY AND TUBES     TONSILLECTOMY     uterine polyp removal      Social History   Socioeconomic History   Marital status: Married    Spouse name: [name]   Number of children: 0   Years of education: Not on file   Highest education level: Not on file  Occupational History   Occupation: Clinical cytogeneticist   Tobacco Use   Smoking status: Never   Smokeless tobacco: Never  Vaping Use   Vaping Use: Never used  Substance and Sexual Activity   Alcohol use: Yes    Comment: occ.   Drug use: No   Sexual activity: Yes    Birth control/protection: Post-menopausal  Other Topics Concern   Not on file  Social History Narrative   Married x 36 years 2022.   Social Determinants of Health   Financial Resource Strain: Low Risk  (04/28/2021)   Overall Financial Resource Strain (CARDIA)    Difficulty of Paying Living Expenses: Not hard at all  Food Insecurity: No  Food Insecurity (04/28/2021)   Hunger Vital Sign    Worried About Running Out of Food in the Last Year: Never true    Ran Out of Food in the Last Year: Never true  Transportation Needs: No Transportation Needs (04/28/2021)   PRAPARE - Hydrologist (Medical): No    Lack of Transportation (Non-Medical): No  Physical Activity: Sufficiently Active (04/28/2021)   Exercise Vital Sign    Days of Exercise per Week: 5 days    Minutes of Exercise per Session: 30 min  Stress: No Stress Concern Present (04/28/2021)   Banquete    Feeling of Stress : Not at all  Social Connections: [place] (04/28/2021)   Social Connection and Isolation Panel [NHANES]    Frequency of Communication with Friends and Family: More than three times a week    Frequency of Social Gatherings with Friends and Family: More than three times a week    Attends Religious Services: More than 4 times per year    Active Member of [employer] or Organizations: Yes    Attends Music therapist: More than 4 times per year    Marital Status: Married   Outpatient Encounter Medications as  of 09/29/2022  Medication Sig   Cholecalciferol (VITAMIN D) 2000 units CAPS Take 2,000 Units by mouth daily with lunch.    losartan (COZAAR) 25 MG tablet TAKE 1 TABLET(25 MG) BY MOUTH DAILY   TIROSINT 50 MCG CAPS Take 1 capsule (50 mcg total) by mouth daily before breakfast.   No facility-administered encounter medications on file as of 09/29/2022.   ALLERGIES: No Known Allergies VACCINATION STATUS: Immunization History  Administered Date(s) Administered   Fluad Quad(high Dose 65+) 04/10/2019, 04/13/2020, 04/20/2021, 04/27/2022   Influenza Split 06/22/2011   Influenza Whole 05/18/2006, 04/30/2007, 04/14/2008   Influenza,inj,Quad PF,6+ Mos 05/31/2013, 05/28/2014, 04/14/2015, 04/27/2016, 05/16/2017, 04/13/2018   Moderna SARS-COV2 Booster  Vaccination 04/29/2021   Moderna Sars-Covid-2 Vaccination 08/23/2019, 09/20/2019, 05/23/2020, 10/30/2020   Pneumococcal Conjugate-13 09/14/2018   Td 08/13/2009   Zoster Recombinat (Shingrix) 03/30/2021, 08/12/2021   Zoster, Live 03/23/2011    Thyroid Problem Presents for follow-up visit. Symptoms include tremors. Patient reports no anxiety, cold intolerance, constipation, depressed mood, diarrhea, fatigue, heat intolerance, leg swelling, palpitations, weight gain or weight loss. The symptoms have been stable.   Hypertension: New diagnosis.  She is not on treatment. Hyperlipidemia: Not on treatment.  Patient wishes to avoid statins. - Review of systems  Constitutional: + Minimally fluctuating body weight.,  current Body mass index is 24.66 kg/m. , no fatigue, no subjective hyperthermia, no subjective hypothermia    Objective:    BP 134/76   Pulse 80   Ht 5\' 5"  (1.651 m)   Wt 148 lb 3.2 oz (67.2 kg)   BMI 24.66 kg/m   Wt Readings from Last 3 Encounters:  09/29/22 148 lb 3.2 oz (67.2 kg)  06/23/22 150 lb (68 kg)  04/27/22 148 lb 0.6 oz (67.2 kg)    BP Readings from Last 3 Encounters:  09/29/22 134/76  06/23/22 127/79  04/27/22 130/84    Physical Exam- Limited  Constitutional:  Body mass index is 24.66 kg/m. , not in acute distress, anxious state of mind Eyes:  EOMI, no exophthalmos Neck: Supple, palpable thyroid.  CMP     Component Value Date/Time   NA 132 (L) 04/27/2022 1043   K 4.9 04/27/2022 1043   CL 95 (L) 04/27/2022 1043   CO2 22 04/27/2022 1043   GLUCOSE 89 04/27/2022 1043   GLUCOSE 102 (H) 05/11/2020 0000   BUN 10 04/27/2022 1043   CREATININE 0.76 04/27/2022 1043   CREATININE 0.80 05/11/2020 0000   CALCIUM 9.2 04/27/2022 1043   PROT 7.3 05/11/2020 0000   ALBUMIN 4.3 04/03/2019 0934   AST 22 05/11/2020 0000   ALT 16 05/11/2020 0000   ALKPHOS 82 04/03/2019 0934   BILITOT 0.5 05/11/2020 0000   GFRNONAA 75 05/11/2020 0000   GFRAA 87 05/11/2020 0000      Diabetic Labs (most recent): Lab Results  Component Value Date   HGBA1C 5.6 (A) 09/29/2022   HGBA1C 5.5 (A) 06/25/2021   HGBA1C 5.7 (H) 05/11/2020      Lipid Panel     Component Value Date/Time   CHOL 202 (H) 09/23/2022 0955   TRIG 51 09/23/2022 0955   HDL 75 09/23/2022 0955   CHOLHDL 2.7 09/23/2022 0955   CHOLHDL 2.5 05/11/2020 0000   VLDL 10 04/03/2019 0934   LDLCALC 118 (H) 09/23/2022 0955   LDLCALC 110 (H) 05/11/2020 0000   Recent Results (from the past 2160 hour(s))  TSH     Status: None   Collection Time: 09/23/22  9:55 AM  Result Value Ref Range  TSH 3.140 0.450 - 4.500 uIU/mL  T4, free     Status: None   Collection Time: 09/23/22  9:55 AM  Result Value Ref Range   Free T4 1.76 0.82 - 1.77 ng/dL  Lipid panel     Status: Abnormal   Collection Time: 09/23/22  9:55 AM  Result Value Ref Range   Cholesterol, Total 202 (H) 100 - 199 mg/dL   Triglycerides 51 0 - 149 mg/dL   HDL 75 >39 mg/dL   VLDL Cholesterol Cal 9 5 - 40 mg/dL   LDL Chol Calc (NIH) 118 (H) 0 - 99 mg/dL   Chol/HDL Ratio 2.7 0.0 - 4.4 ratio    Comment:                                   T. Chol/HDL Ratio                                             Men  Women                               1/2 Avg.Risk  3.4    3.3                                   Avg.Risk  5.0    4.4                                2X Avg.Risk  9.6    7.1                                3X Avg.Risk 23.4   11.0   HgB A1c     Status: Abnormal   Collection Time: 09/29/22 10:33 AM  Result Value Ref Range   Hemoglobin A1C     HbA1c POC (<> result, manual entry)     HbA1c, POC (prediabetic range) 5.6 (A) 5.7 - 6.4 %   HbA1c, POC (controlled diabetic range)       Assessment & Plan:   1.  Primary hypothyroidism -Her previsit thyroid function tests are consistent with appropriate replacement.  She is advised to continue Tirosint 50 mcg p.o. daily before breakfast.     - We discussed about the correct intake of her thyroid  hormone, on empty stomach at fasting, with water, separated by at least 30 minutes from breakfast and other medications,  and separated by more than 4 hours from calcium, iron, multivitamins, acid reflux medications (PPIs). -Patient is made aware of the fact that thyroid hormone replacement is needed for life, dose to be adjusted by periodic monitoring of thyroid function tests.  -She did not tolerate levothyroxine or Synthroid in the past.    2. Nontoxic multinodular goiter Her last ultrasound from 2017 was unchanged from previous studies, showing no significant findings.  More recent imaging showed similar findings in 2022.  No further thyroid imaging is necessary.    3.  Hypertension -She is responding to her blood pressure management with losartan 25 mg p.o. daily at breakfast.  She  is advised to continue.    4) Hyperlipidemia: she does not like to take statins.  She has disengaged from lifestyle medicine and her lipid panel shows worsening with LDL of 118 increasing from 98.  She would like to restart her lifestyle medicine instead of going on statins.   - she acknowledges that there is a room for improvement in her food and drink choices. - Suggestion is made for her to avoid simple carbohydrates  from her diet including Cakes, Sweet Desserts, Ice Cream, Soda (diet and regular), Sweet Tea, Candies, Chips, Cookies, Store Bought Juices, Alcohol , Artificial Sweeteners,  Coffee Creamer, and "Sugar-free" Products, Lemonade. This will help patient to have more stable blood glucose profile and potentially avoid unintended weight gain.  The following Lifestyle Medicine recommendations according to [facility]) were discussed and and offered to patient and she  agrees to start the journey:  A. Whole Foods, Plant-Based Nutrition comprising of fruits and vegetables, plant-based proteins, whole-grain carbohydrates was discussed in detail with the patient.   A list for  source of those nutrients were also provided to the patient.  Patient will use only water or unsweetened tea for hydration. B.  The need to stay away from risky substances including alcohol, smoking; obtaining 7 to 9 hours of restorative sleep, at least 150 minutes of moderate intensity exercise weekly, the importance of healthy social connections,  and stress management techniques were discussed. C.  A full color page of  Calorie density of various food groups per pound showing examples of each food groups was provided to the patient.    5) Prediabetes- resolved with ac of 5.6%.  She will not need medication intervention for this at this time.  Lifestyle medicine nutrition described above will help her avoid progression to type 2 diabetes.   - I advised patient to maintain close follow up with Fayrene Helper, MD for primary care needs.  I spent  26  minutes in the care of the patient today including review of labs from Thyroid Function, CMP, and other relevant labs ; imaging/biopsy records (current and previous including abstractions from other facilities); face-to-face time discussing  her lab results and symptoms, medications doses, her options of short and long term treatment based on the latest standards of care / guidelines;   and documenting the encounter.  [name]  participated in the discussions, expressed understanding, and voiced agreement with the above plans.  All questions were answered to her satisfaction. she is encouraged to contact clinic should she have any questions or concerns prior to her return visit.   Follow up plan: Return in about 6 months (around 04/01/2023) for Fasting Labs  in AM B4 8.  Rayetta Pigg, [facility] [address] [place], [place] [address] Phone: [phone] Fax: [phone]  09/29/2022, 1:19 PM

## 2022-10-13 ENCOUNTER — Other Ambulatory Visit: Payer: Self-pay | Admitting: "Endocrinology

## 2022-10-13 MED ORDER — TIROSINT 50 MCG PO CAPS
50.0000 ug | ORAL_CAPSULE | Freq: Every day | ORAL | 1 refills | Status: DC
  Filled 2022-10-13: qty 90, 90d supply, fill #0
  Filled 2023-01-12: qty 90, 90d supply, fill #1

## 2022-10-14 ENCOUNTER — Other Ambulatory Visit: Payer: Self-pay

## 2022-10-14 ENCOUNTER — Other Ambulatory Visit (HOSPITAL_COMMUNITY): Payer: Self-pay

## 2022-10-17 ENCOUNTER — Other Ambulatory Visit: Payer: Self-pay

## 2022-10-27 ENCOUNTER — Encounter: Payer: Self-pay | Admitting: Family Medicine

## 2022-10-27 ENCOUNTER — Ambulatory Visit: Payer: Medicare Other | Admitting: Family Medicine

## 2022-10-27 ENCOUNTER — Ambulatory Visit
Admission: EM | Admit: 2022-10-27 | Discharge: 2022-10-27 | Disposition: A | Discharge status: Home / Self Care | Payer: Medicare Other | Attending: Nurse Practitioner | Admitting: Nurse Practitioner

## 2022-10-27 DIAGNOSIS — Z23 Encounter for immunization: Secondary | ICD-10-CM | POA: Diagnosis not present

## 2022-10-27 DIAGNOSIS — L03113 Cellulitis of right upper limb: Secondary | ICD-10-CM | POA: Diagnosis not present

## 2022-10-27 DIAGNOSIS — S61451A Open bite of right hand, initial encounter: Secondary | ICD-10-CM

## 2022-10-27 DIAGNOSIS — W5501XA Bitten by cat, initial encounter: Secondary | ICD-10-CM

## 2022-10-27 MED ORDER — AMOXICILLIN-POT CLAVULANATE 875-125 MG PO TABS: 1.0000 | ORAL_TABLET | Freq: Two times a day (BID) | ORAL | 0 refills | Status: DC

## 2022-10-27 MED ORDER — TETANUS-DIPHTH-ACELL PERTUSSIS 5-2.5-18.5 LF-MCG/0.5 IM SUSY
0.5000 mL | PREFILLED_SYRINGE | Freq: Once | INTRAMUSCULAR | Status: AC
  Administered 2022-10-27: 0.5 mL via INTRAMUSCULAR

## 2022-10-27 NOTE — ED Triage Notes (Signed)
Pt reports she got bit by her cat on her right hand trying to pull a fur ball out of its stomach x 1 day. The bruise goes from the base of her thumb up to her forearm and it is swollen and painful. Reports she doesn't know when her last tetanus was.  Says her cat has its shots about 3 years ago.

## 2022-10-27 NOTE — Discharge Instructions (Addendum)
Your tetanus shot was updated today.  It is good for the next 10 years. Take medication as prescribed. Increase fluids and allow for plenty of rest. May take over-the-counter Tylenol as needed for pain, fever, or general discomfort. Apply ice to the right hand to help with pain and swelling. Continue to monitor the area of the cat bite.  As discussed, if the redness begins to extend up the arm or more into the hand, you develop fever, chills, have drainage that is foul-smelling from the site, or other concerns, please follow-up in this clinic or in the emergency department immediately for further evaluation. As discussed, you declined the rabies series at this time.  You do have 7 days from the date of the injury to begin the vaccines.  If you choose to receive the vaccines, you can follow-up in this clinic, or check with your primary care physician to see if they offer the medication. Follow-up as needed.

## 2022-10-27 NOTE — ED Provider Notes (Signed)
[facility]    CSN: [id number] Arrival date & time: 10/27/22  1191      History   Chief Complaint No chief complaint on file.   HPI Julie May is a 72 y.o. female.   The history is provided by the patient and the spouse.   The patient presents for complaints of redness, pain, and swelling to the right hand after her cat bit her 1 day ago.  Patient states she was trying to pull up for a ball of the cat stomach with a comb.  She states that since the bite, she has pain, swelling, and redness to the right thumb and hand.  She states that the redness appears to be going up her right arm.  She denies fever, chills, chest pain, abdominal pain, nausea, vomiting, or diarrhea.  Patient does not recall when she had her last tetanus shot.  She states that the cat's rabies vaccine was 3 years ago, and has since expired.    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    Patient Active Problem List   Diagnosis Date Noted   Spondylolisthesis of lumbar region 01/28/2020   Callus of foot 11/07/2017   Prediabetes 08/15/2017   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   Osteopenia 04/04/2015   Right foot pain 05/23/2013   Essential hypertension, benign 11/12/2010   Hypothyroidism 01/18/2010   Mixed hyperlipidemia 12/17/2009    Past Surgical History:  Procedure Laterality Date   COLONOSCOPY N/A 03/30/2016   Procedure: COLONOSCOPY;  Surgeon: Malissa Hippo,  MD;  Location: [facility];  Service: Endoscopy;  Laterality: N/A;  1030   SPINE SURGERY Bilateral 01/2020   Dr. Elie Confer Spine   TONSILECTOMY, ADENOIDECTOMY, BILATERAL MYRINGOTOMY AND TUBES     TONSILLECTOMY     uterine polyp removal       OB History   No obstetric history on file.      Home Medications    Prior to Admission medications   Medication Sig Start Date End Date Taking? Authorizing Provider  amoxicillin-clavulanate (AUGMENTIN) 875-125 MG tablet Take 1 tablet by mouth every 12 (twelve) hours. 10/27/22  Yes Rebeckah Masih-Warren, Sadie Haber, NP  Cholecalciferol (VITAMIN D) 2000 units CAPS Take 2,000 Units by mouth daily with lunch.     [provider]  losartan (COZAAR) 25 MG tablet TAKE 1 TABLET(25 MG) BY MOUTH DAILY 09/08/22   Roma Kayser, MD  TIROSINT 50 MCG CAPS Take 1 capsule (50 mcg total) by mouth daily before breakfast. 10/13/22   Nida, Denman George, MD    Family History Family History  Problem Relation Age of Onset   Heart attack Father    Stroke Mother    Hypertension Mother    Thyroid disease Sister    Hyperlipidemia Brother    Heart attack Brother    Heart disease Brother    Arthritis Other  family history    Hypertension Other        family history    Stroke Other        family history    Heart disease Other        family history     Social History Social History   Tobacco Use   Smoking status: Never   Smokeless tobacco: Never  Vaping Use   Vaping Use: Never used  Substance Use Topics   Alcohol use: Yes    Comment: occ.   Drug use: No     Allergies   Patient has no known allergies.   Review of Systems Review of Systems Per HPI  Physical Exam Triage Vital Signs ED Triage Vitals  Enc Vitals Group     BP 10/27/22 1920 (!) 172/98     Pulse Rate 10/27/22 1920 (!) 111     Resp 10/27/22 1920 16     Temp 10/27/22 1920 99 F (37.2 C)     Temp Source 10/27/22 1915 Oral     SpO2 10/27/22 1920 94 %      Weight --      Height --      Head Circumference --      Peak Flow --      Pain Score 10/27/22 1921 6     Pain Loc --      Pain Edu? --      Excl. in GC? --    No data found.  Updated Vital Signs BP (!) 172/98 (BP Location: Right Arm)   Pulse (!) 111   Temp 99 F (37.2 C) (Oral)   Resp 16   SpO2 94%   Visual Acuity Right Eye Distance:   Left Eye Distance:   Bilateral Distance:    Right Eye Near:   Left Eye Near:    Bilateral Near:     Physical Exam Vitals and nursing note reviewed.  Constitutional:      General: She is not in acute distress.    Appearance: Normal appearance.  HENT:     Head: Normocephalic.     Mouth/Throat:     Mouth: Mucous membranes are moist.  Eyes:     Extraocular Movements: Extraocular movements intact.     Conjunctiva/sclera: Conjunctivae normal.     Pupils: Pupils are equal, round, and reactive to light.  Cardiovascular:     Rate and Rhythm: Normal rate and regular rhythm.     Pulses: Normal pulses.     Heart sounds: Normal heart sounds.  Pulmonary:     Effort: Pulmonary effort is normal.     Breath sounds: Normal breath sounds.  Musculoskeletal:     Right hand: Swelling and tenderness present. No deformity. Normal range of motion. Normal capillary refill. Normal pulse.     Cervical back: Normal range of motion.     Comments: Redness, erythema, and swelling noted to the dorsal aspect of the right hand below the snuffbox region.  Swelling and erythema extends over the dorsal aspect of the hand.  Erythema noted along the inner aspect of the right forearm/wrist.  Area is warm to palpation.  There is no oozing, fluctuance, or drainage present.  Lymphadenopathy:     Cervical: No cervical adenopathy.  Skin:    General: Skin is warm and dry.  Neurological:     General: No focal deficit present.     Mental Status: She is alert and oriented to person, place, and time.  Psychiatric:  Mood and Affect: Mood normal.        Behavior:  Behavior normal.      UC Treatments / Results  Labs (all labs ordered are listed, but only abnormal results are displayed) Labs Reviewed - No data to display  EKG   Radiology No results found.  Procedures Procedures (including critical care time)  Medications Ordered in UC Medications  Tdap (BOOSTRIX) injection 0.5 mL (0.5 mLs Intramuscular Given 10/27/22 1946)    Initial Impression / Assessment and Plan / UC Course  I have reviewed the triage vital signs and the nursing notes.  Pertinent labs & imaging results that were available during my care of the patient were reviewed by me and considered in my medical decision making (see chart for details).  Patient with bite to the right hand.  Patient with cellulitis to the right hand, with redness and erythema extending up towards the right wrist and forearm.  Patient declines rabies vaccine series, despite her cat's rabies vaccine being expired.  Patient was advised that she has 7 days from the date of injury to determine if she would like to begin the rabies vaccines.  Patient's tetanus shot was updated.  Patient was started on Augmentin 875/125 mg twice daily for the next 7 days for cellulitis.  Supportive care recommendations were provided and discussed with the patient to include Tylenol for pain or discomfort, ice to the affected area to help with pain and swelling, along with indications of when follow-up would be necessary.  Patient states that she plans to speak with her PCP regarding what she should do.  Patient is in agreement with this plan care and verbalizes understanding..  Patient stable for discharge.   Final Clinical Impressions(s) / UC Diagnoses   Final diagnoses:  Cellulitis of right hand  Cat bite of right hand, initial encounter     Discharge Instructions      Your tetanus shot was updated today.  It is good for the next 10 years. Take medication as prescribed. Increase fluids and allow for plenty of  rest. May take over-the-counter Tylenol as needed for pain, fever, or general discomfort. Apply ice to the right hand to help with pain and swelling. Continue to monitor the area of the cat bite.  As discussed, if the redness begins to extend up the arm or more into the hand, you develop fever, chills, have drainage that is foul-smelling from the site, or other concerns, please follow-up in this clinic or in the emergency department immediately for further evaluation. As discussed, you declined the rabies series at this time.  You do have 7 days from the date of the injury to begin the vaccines.  If you choose to receive the vaccines, you can follow-up in this clinic, or check with your primary care physician to see if they offer the medication. Follow-up as needed.     ED Prescriptions     Medication Sig Dispense Auth. Provider   amoxicillin-clavulanate (AUGMENTIN) 875-125 MG tablet Take 1 tablet by mouth every 12 (twelve) hours. 14 tablet Zenya Hickam-Warren, Sadie Haber, NP      PDMP not reviewed this encounter.   Abran Cantor, NP 10/27/22 1955

## 2022-10-29 ENCOUNTER — Emergency Department (HOSPITAL_COMMUNITY): Payer: Medicare Other

## 2022-10-29 ENCOUNTER — Encounter (HOSPITAL_COMMUNITY): Payer: Self-pay | Admitting: Family Medicine

## 2022-10-29 ENCOUNTER — Other Ambulatory Visit: Payer: Self-pay

## 2022-10-29 ENCOUNTER — Observation Stay (HOSPITAL_COMMUNITY)
Admission: EM | Admit: 2022-10-29 | Discharge: 2022-10-30 | Disposition: A | Discharge status: Home / Self Care | Payer: Medicare Other | Attending: Family Medicine | Admitting: Family Medicine

## 2022-10-29 DIAGNOSIS — M7989 Other specified soft tissue disorders: Secondary | ICD-10-CM | POA: Diagnosis not present

## 2022-10-29 DIAGNOSIS — W5501XA Bitten by cat, initial encounter: Secondary | ICD-10-CM | POA: Insufficient documentation

## 2022-10-29 DIAGNOSIS — R7401 Elevation of levels of liver transaminase levels: Secondary | ICD-10-CM | POA: Diagnosis not present

## 2022-10-29 DIAGNOSIS — L03119 Cellulitis of unspecified part of limb: Secondary | ICD-10-CM

## 2022-10-29 DIAGNOSIS — E039 Hypothyroidism, unspecified: Secondary | ICD-10-CM | POA: Diagnosis not present

## 2022-10-29 DIAGNOSIS — I10 Essential (primary) hypertension: Secondary | ICD-10-CM | POA: Insufficient documentation

## 2022-10-29 DIAGNOSIS — L03113 Cellulitis of right upper limb: Secondary | ICD-10-CM | POA: Diagnosis not present

## 2022-10-29 DIAGNOSIS — E876 Hypokalemia: Secondary | ICD-10-CM | POA: Insufficient documentation

## 2022-10-29 DIAGNOSIS — S51859A Open bite of unspecified forearm, initial encounter: Secondary | ICD-10-CM | POA: Diagnosis present

## 2022-10-29 DIAGNOSIS — S51851A Open bite of right forearm, initial encounter: Principal | ICD-10-CM | POA: Insufficient documentation

## 2022-10-29 LAB — COMPREHENSIVE METABOLIC PANEL
ALT: 122 U/L — ABNORMAL HIGH (ref 0–44)
AST: 106 U/L — ABNORMAL HIGH (ref 15–41)
Albumin: 3.5 g/dL (ref 3.5–5.0)
Alkaline Phosphatase: 112 U/L (ref 38–126)
Anion gap: 10 (ref 5–15)
BUN: 8 mg/dL (ref 8–23)
CO2: 24 mmol/L (ref 22–32)
Calcium: 8.6 mg/dL — ABNORMAL LOW (ref 8.9–10.3)
Chloride: 93 mmol/L — ABNORMAL LOW (ref 98–111)
Creatinine, Ser: 0.69 mg/dL (ref 0.44–1.00)
GFR, Estimated: >60 mL/min (ref >60)
Glucose, Bld: 132 mg/dL — ABNORMAL HIGH (ref 70–99)
Potassium: 3.4 mmol/L — ABNORMAL LOW (ref 3.5–5.1)
Sodium: 127 mmol/L — ABNORMAL LOW (ref 135–145)
Total Bilirubin: 0.9 mg/dL (ref 0.3–1.2)
Total Protein: 6.8 g/dL (ref 6.5–8.1)

## 2022-10-29 LAB — CBC
HCT: 38.4 % (ref 36.0–46.0)
Hemoglobin: 13.3 g/dL (ref 12.0–15.0)
MCH: 29.3 pg (ref 26.0–34.0)
MCHC: 34.6 g/dL (ref 30.0–36.0)
MCV: 84.6 fL (ref 80.0–100.0)
Platelets: 228 10*3/uL (ref 150–400)
RBC: 4.54 MIL/uL (ref 3.87–5.11)
RDW: 13.1 % (ref 11.5–15.5)
WBC: 7.7 10*3/uL (ref 4.0–10.5)
nRBC: 0 % (ref 0.0–0.2)

## 2022-10-29 LAB — CULTURE, BLOOD (ROUTINE X 2)
Culture: NO GROWTH
Culture: NO GROWTH

## 2022-10-29 LAB — TSH: TSH: 3.679 u[IU]/mL (ref 0.350–4.500)

## 2022-10-29 MED ORDER — POTASSIUM CHLORIDE CRYS ER 20 MEQ PO TBCR
40.0000 meq | EXTENDED_RELEASE_TABLET | Freq: Once | ORAL | Status: AC
  Administered 2022-10-29: 40 meq via ORAL
  Filled 2022-10-29: qty 2

## 2022-10-29 MED ORDER — SODIUM CHLORIDE 0.9 % IV SOLN
3.0000 g | Freq: Three times a day (TID) | INTRAVENOUS | Status: DC
  Administered 2022-10-29 – 2022-10-30 (×3): 3 g via INTRAVENOUS
  Filled 2022-10-29 (×3): qty 8

## 2022-10-29 MED ORDER — SODIUM CHLORIDE 0.9 % IV BOLUS
1000.0000 mL | Freq: Once | INTRAVENOUS | Status: AC
  Administered 2022-10-29: 1000 mL via INTRAVENOUS

## 2022-10-29 MED ORDER — VITAMIN D3 25 MCG (1000 UNIT) PO TABS
2000.0000 [IU] | ORAL_TABLET | Freq: Every day | ORAL | Status: DC
  Administered 2022-10-30: 2000 [IU] via ORAL
  Filled 2022-10-29: qty 2

## 2022-10-29 MED ORDER — LEVOTHYROXINE SODIUM 50 MCG PO TABS
50.0000 ug | ORAL_TABLET | Freq: Every day | ORAL | Status: DC
  Filled 2022-10-29: qty 2

## 2022-10-29 MED ORDER — SODIUM CHLORIDE 0.9 % IV SOLN: INTRAVENOUS | Status: AC

## 2022-10-29 MED ORDER — SODIUM CHLORIDE 0.9 % IV SOLN
1.5000 g | Freq: Once | INTRAVENOUS | Status: AC
  Administered 2022-10-29: 1.5 g via INTRAVENOUS
  Filled 2022-10-29: qty 4

## 2022-10-29 MED ORDER — LOSARTAN POTASSIUM 25 MG PO TABS
25.0000 mg | ORAL_TABLET | Freq: Every day | ORAL | Status: DC
  Administered 2022-10-30: 25 mg via ORAL
  Filled 2022-10-29 (×2): qty 1

## 2022-10-29 MED ORDER — ENOXAPARIN SODIUM 40 MG/0.4ML IJ SOSY
40.0000 mg | PREFILLED_SYRINGE | INTRAMUSCULAR | Status: DC
  Filled 2022-10-29: qty 0.4

## 2022-10-29 NOTE — ED Notes (Signed)
Report given to [facility] LPN

## 2022-10-29 NOTE — Progress Notes (Signed)
Pharmacy Antibiotic Note  Julie May is a 72 y.o. female admitted on 10/29/2022 with hand infection 2nd cat bite.  Pharmacy has been consulted for Unasyn dosing. Renal function reviewed.  Plan: Unasyn 3gm IV q8hr  Height:  (165.1 cm) Weight: 68 kg (150 lb) IBW/kg (Calculated) : 57  Temp (24hrs), Avg:98 F (36.7 C), Min:98 F (36.7 C), Max:98 F (36.7 C)  Recent Labs  Lab 10/29/22 1032  WBC 7.7  CREATININE 0.69    Estimated Creatinine Clearance: 57.2 mL/min (by C-G formula based on SCr of 0.69 mg/dL).    No Known Allergies  Microbiology results: 4/20 BCx: P   Thank you for allowing pharmacy to be a part of this patient's care.  Caryl Asp, PharmD Clinical Pharmacist 10/29/2022 3:18 PM

## 2022-10-29 NOTE — ED Provider Notes (Signed)
EMERGENCY DEPARTMENT AT [facility] Provider Note   CSN: [id number] Arrival date & time: 10/29/22  1914     History  Chief Complaint  Patient presents with   Animal Bite    Julie May is a 72 y.o. female with a past medical history of hypertension, hypothyroidism presents today for evaluation of a bite wound.  Patient states she was bitten by her cat on Wednesday.  She was seen in urgent care told to have cellulitis, given Augmentin.  Patient reports she has been taking Augmentin for 2 days.  Last dose was this morning.  Reports that the pain and swelling have spread all over her right hand on the dorsal aspect and up to near her right elbow and in the inner side.  Denies any fever, nausea, vomiting.  States her right hand feels very tight.  She was also given a tetanus shot at urgent care.   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 Elie Confer Spine   TONSILECTOMY, ADENOIDECTOMY, BILATERAL MYRINGOTOMY AND TUBES     TONSILLECTOMY     uterine polyp removal        Home Medications Prior to Admission medications   Medication Sig Start Date End Date Taking?  Authorizing Provider  amoxicillin-clavulanate (AUGMENTIN) 875-125 MG tablet Take 1 tablet by mouth every 12 (twelve) hours. 10/27/22   Leath-Warren, Sadie Haber, NP  Cholecalciferol (VITAMIN D) 2000 units CAPS Take 2,000 Units by mouth daily with lunch.     [provider]  losartan (COZAAR) 25 MG tablet TAKE 1 TABLET(25 MG) BY MOUTH DAILY 09/08/22   Roma Kayser, MD  TIROSINT 50 MCG CAPS Take 1 capsule (50 mcg total) by mouth daily before breakfast. 10/13/22   Nida, Denman George, MD      Allergies    Patient has no known allergies.    Review of Systems   Review of Systems Negative except as per HPI.  Physical Exam Updated Vital Signs BP 109/62   Pulse 70   Temp 98 F (36.7 C) (Oral)   Resp 14   Ht 5\' 5"  (1.651 m)   Wt 68 kg   SpO2 96%   BMI 24.96 kg/m  Physical Exam Vitals and nursing note reviewed.  Constitutional:      Appearance: Normal appearance.  HENT:     Head: Normocephalic and atraumatic.     Mouth/Throat:     Mouth: Mucous membranes are moist.  Eyes:     General: No scleral icterus. Cardiovascular:     Rate and Rhythm: Normal rate and regular  rhythm.     Pulses: Normal pulses.     Heart sounds: Normal heart sounds.  Pulmonary:     Effort: Pulmonary effort is normal.     Breath sounds: Normal breath sounds.  Abdominal:     General: Abdomen is flat.     Palpations: Abdomen is soft.     Tenderness: There is no abdominal tenderness.  Musculoskeletal:        General: No deformity.  Skin:    General: Skin is warm.     Findings: No rash.     Comments: Erythema, swelling to the dorsal aspect of right hand.  Lymphangitic spreading to near R elbow. No pus, fluid drainage noted.  Neurological:     General: No focal deficit present.     Mental Status: She is alert.  Psychiatric:        Mood and Affect: Mood normal.     ED Results / Procedures / Treatments   Labs (all labs ordered are listed, but only abnormal results are displayed) Labs  Reviewed  COMPREHENSIVE METABOLIC PANEL - Abnormal; Notable for the following components:      Result Value   Sodium 127 (*)    Potassium 3.4 (*)    Chloride 93 (*)    Glucose, Bld 132 (*)    Calcium 8.6 (*)    AST 106 (*)    ALT 122 (*)    All other components within normal limits  CULTURE, BLOOD (ROUTINE X 2)  CULTURE, BLOOD (ROUTINE X 2)  CBC    EKG EKG Interpretation  Date/Time:  Saturday October 29 2022 11:13:25 EDT Ventricular Rate:  77 PR Interval:  212 QRS Duration: 104 QT Interval:  381 QTC Calculation: 432 R Axis:   74 Text Interpretation: Sinus rhythm Borderline prolonged PR interval Consider left atrial enlargement Abnormal R-wave progression, early transition No significant change since prior 10/20 Confirmed by Meridee Score [phone]) on 10/29/2022 11:17:21 AM  Radiology DG Hand 2 View Right  Result Date: 10/29/2022 CLINICAL DATA:  Right hand redness and swelling after cat bite EXAM: RIGHT HAND - 2 VIEW COMPARISON:  None Available. FINDINGS: There is no evidence of fracture or dislocation. No erosion or periosteal elevation. Moderate osteoarthritic changes of the hand and wrist. Prominent soft tissue swelling. No radiopaque foreign body. No soft tissue gas. IMPRESSION: 1. Prominent soft tissue swelling of the right hand. No radiopaque foreign body or soft tissue gas. 2. No acute osseous abnormality. Electronically Signed   By: Duanne Guess D.O.   On: 10/29/2022 10:52    Procedures Procedures    Medications Ordered in ED Medications  ampicillin-sulbactam (UNASYN) 1.5 g in sodium chloride 0.9 % 100 mL IVPB (0 g Intravenous Stopped 10/29/22 1208)  sodium chloride 0.9 % bolus 1,000 mL (0 mLs Intravenous Stopped 10/29/22 1240)  sodium chloride 0.9 % bolus 1,000 mL (1,000 mLs Intravenous New Bag/Given 10/29/22 1239)    ED Course/ Medical Decision Making/ A&P Clinical Course as of 10/29/22 1448  Sat Oct 29, 2022  3651 71 year old female here with right hand swelling  pain and redness extending up arm after cat bite 4 days ago.  She has been on antibiotics for the last 2 days with worsening of condition.  No fever otherwise nontoxic-appearing.  Has some lymphangitic spread up to about her elbow.  Getting labs antibiotics x-ray. [MB]  1117 Patient had a likely vagal episode getting IV placed.  Nurse said she became less responsive and blood pressure dropped.  She is  now awake alert again.  EKG showing normal sinus rhythm.  Will continue to monitor [MB]    Clinical Course User Index [MB] Terrilee Files, MD                             Medical Decision Making Amount and/or Complexity of Data Reviewed Labs: ordered. Radiology: ordered.   This patient presents to the ED for bite wound, this involves an extensive number of treatment options, and is a complaint that carries with a high risk of complications and morbidity.  The differential diagnosis includes cellulitis, foreign body, laceration, abscess.  This is not an exhaustive list.  Lab tests: I ordered and personally interpreted labs.  The pertinent results include: WBC unremarkable. Hbg unremarkable. Platelets unremarkable. Electrolytes significant for sodium 137.  Potassium 3.4.. BUN, creatinine unremarkable.   Imaging studies: I ordered imaging studies. I personally reviewed, interpreted imaging and agree with the radiologist's interpretations. The results include: Hand x-ray shows soft tissue swelling, no fracture or dislocation.  Problem list/ ED course/ Critical interventions/ Medical management: HPI: See above Vital signs within normal range and stable throughout visit. Laboratory/imaging studies significant for: See above. On physical examination, patient is afebrile and appears in no acute distress. This patient presents with initial presentation of local erythema, warmth, swelling concerning for cellulitis.  There was also genetic spreading from the right wrist to near the right elbow.  Sensitivity/pain to light touch around the erythematous area. No fluid pockets or fluctuance concerning for abscess noted. Low concern for osteomyelitis or DVT. No immune compromise, bullae, pain out of proportion, or rapid progression concerning for necrotizing fasciitis.  Patient has failed outpatient p.o. antibiotics.  I do think patient will benefit from inpatient admission for IV antibiotics.  I have reviewed the patient home medicines and have made adjustments as needed.  Cardiac monitoring/EKG: The patient was maintained on a cardiac monitor.  I personally reviewed and interpreted the cardiac monitor which showed an underlying rhythm of: sinus rhythm.  Additional history obtained: External records from outside source obtained and reviewed including: Chart review including previous notes, labs, imaging.  Consultations obtained: I requested consultation with Dr. Dallas Schimke hand surgery, and discussed lab and imaging findings as well as pertinent plan.  He/she recommended admission. I requested consultation with Dr. Sharl Ma [facility] hospitalist, and discussed lab and imaging findings as well as pertinent plan.  He/she recommended admission.  Disposition Admit.  This chart was dictated using voice recognition software.  Despite best efforts to proofread,  errors can occur which can change the documentation meaning.          Final Clinical Impression(s) / ED Diagnoses Final diagnoses:  Cellulitis of hand    Rx / DC Orders ED Discharge Orders     None         Jeanelle Malling, [place] 10/29/22 1451    Terrilee Files, MD 10/29/22 1726

## 2022-10-29 NOTE — H&P (Addendum)
TRH H&P    Patient Demographics:    Julie May, is a 72 y.o. female  MRN: [id number]  DOB - [birth year]  Admit Date - 10/29/2022    Chief complaint-cat bite   HPI:    Julie May  is a 72 y.o. female, history of hypothyroidism, hypertension presented with complaints of cat bite 3 days ago.  Patient said that she was bitten by her cat on Wednesday.  She went to urgent care and was told that she has cellulitis and was given Augmentin.  Patient has been taking Augmentin for 2 days, developed diarrhea.  Also had pain and swelling over the right hand dorsal aspect.  No fever.  No nausea.  Complains of diarrhea but no vomiting.  Denies chest pain or shortness of breath. In the ED, x-ray of the hand showed prominent soft tissue swelling of the right hand.  No radiopaque foreign body or soft tissue gas. Patient started on IV Unasyn. Denies dysuria or abdominal pain    Review of systems:    In addition to the HPI above,    All other systems reviewed and are negative.    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 Elie Confer Spine   TONSILECTOMY, ADENOIDECTOMY, BILATERAL MYRINGOTOMY AND TUBES     TONSILLECTOMY     uterine polyp removal         Social History:      Social History   Tobacco Use   Smoking status: Never   Smokeless tobacco: Never  Substance Use Topics   Alcohol use: Yes    Comment: occ.       Family History :     Family History  Problem Relation Age of Onset   Heart attack Father    Stroke Mother    Hypertension Mother    Thyroid disease Sister    Hyperlipidemia Brother    Heart attack Brother    Heart disease Brother    Arthritis Other        family history    Hypertension Other        family history  Stroke Other        family history    Heart disease Other        family history       Home Medications:   Prior to Admission medications   Medication Sig Start Date End Date Taking? Authorizing Provider  amoxicillin-clavulanate (AUGMENTIN) 875-125 MG tablet Take 1 tablet by mouth every 12 (twelve) hours. 10/27/22   Leath-Warren, Sadie Haber, NP  Cholecalciferol (VITAMIN D) 2000 units CAPS Take 2,000 Units by mouth daily with lunch.     [provider]  losartan (COZAAR) 25 MG tablet TAKE 1 TABLET(25 MG) BY MOUTH DAILY 09/08/22   Roma Kayser, MD  TIROSINT 50 MCG CAPS Take 1 capsule (50 mcg total) by mouth daily before breakfast. 10/13/22   Roma Kayser, MD     Allergies:    No Known Allergies   Physical Exam:   Vitals  Blood pressure 109/62, pulse 70, temperature 98 F (36.7 C), temperature source Oral, resp. rate 14, height  (1.651 m), weight 68 kg, SpO2 96 %.  1.  General: Appears in no acute distress  2. Psychiatric: Alert, oriented x 3, intact insight and judgment  3. Neurologic: Cranial nerves II through XII grossly intact, no focal deficit noted  4. HEENMT:  Atraumatic normocephalic, extraocular muscles are intact  5. Respiratory : Clear to auscultation bilaterally  6.  Cardiovascular : S1-S2, regular, no murmur auscultated  7. Gastrointestinal:  Abdomen is soft, nontender, no organomegaly  8. Skin:  Right upper extremity has erythematous streak going up from and towards elbow on the medial aspect, also has bite mark noted on the dorsum of the hand between thumb and index finger      Data Review:    CBC Recent Labs  Lab 10/29/22 1032  WBC 7.7  HGB 13.3  HCT 38.4  PLT 228  MCV 84.6  MCH 29.3  MCHC 34.6  RDW 13.1   ------------------------------------------------------------------------------------------------------------------  Results for orders placed or performed during the hospital encounter of 10/29/22 (from the past 48 hour(s))  CBC     Status: None   Collection Time: 10/29/22 10:32 AM  Result Value Ref Range   WBC 7.7 4.0 - 10.5 K/uL   RBC 4.54 3.87 - 5.11 MIL/uL   Hemoglobin 13.3 12.0 - 15.0 g/dL   HCT 81.1 91.4 - 78.2 %   MCV 84.6 80.0 - 100.0 fL   MCH 29.3 26.0 - 34.0 pg   MCHC 34.6 30.0 - 36.0 g/dL   RDW 95.6 21.3 - 08.6 %   Platelets 228 150 - 400 K/uL   nRBC 0.0 0.0 - 0.2 %    Comment: Performed at [facility], [address] [place], [place] [address]  Comprehensive metabolic panel     Status: Abnormal   Collection Time: 10/29/22 10:32 AM  Result Value Ref Range   Sodium 127 (L) 135 - 145 mmol/L   Potassium 3.4 (L) 3.5 - 5.1 mmol/L   Chloride 93 (L) 98 - 111 mmol/L   CO2 24 22 - 32 mmol/L   Glucose, Bld 132 (H) 70 - 99 mg/dL    Comment: Glucose reference range applies only to samples taken after fasting for at least 8 hours.   BUN 8 8 - 23 mg/dL   Creatinine, Ser 9.62 0.44 - 1.00 mg/dL   Calcium 8.6 (L) 8.9 - 10.3 mg/dL   Total Protein 6.8 6.5 - 8.1 g/dL   Albumin 3.5 3.5 - 5.0 g/dL  AST 106 (H) 15 - 41 U/L   ALT 122 (H) 0 - 44 U/L   Alkaline Phosphatase 112 38 - 126 U/L   Total Bilirubin 0.9 0.3 - 1.2 mg/dL   GFR, Estimated >16 >10 mL/min    Comment: (NOTE) Calculated using the CKD-EPI Creatinine  Equation (2021)    Anion gap 10 5 - 15    Comment: Performed at [facility], [address], [place], [place] [address]  Blood culture (routine x 2)     Status: None (Preliminary result)   Collection Time: 10/29/22 10:32 AM   Specimen: BLOOD LEFT WRIST  Result Value Ref Range   Specimen Description      BLOOD LEFT WRIST BOTTLES DRAWN AEROBIC AND ANAEROBIC   Special Requests      Blood Culture results may not be optimal due to an excessive volume of blood received in culture bottles Performed at [facility], [address] [place], [place] [address]    Culture PENDING    Report Status PENDING   Blood culture (routine x 2)     Status: None (Preliminary result)   Collection Time: 10/29/22 10:37 AM   Specimen: Right Antecubital; Blood  Result Value Ref Range   Specimen Description      RIGHT ANTECUBITAL BOTTLES DRAWN AEROBIC AND ANAEROBIC   Special Requests      Blood Culture results may not be optimal due to an excessive volume of blood received in culture bottles Performed at [facility], [address], [place], [place] [address]    Culture PENDING    Report Status PENDING     Chemistries  Recent Labs  Lab 10/29/22 1032  NA 127*  K 3.4*  CL 93*  CO2 24  GLUCOSE 132*  BUN 8  CREATININE 0.69  CALCIUM 8.6*  AST 106*  ALT 122*  ALKPHOS 112  BILITOT 0.9   ------------------------------------------------------------------------------------------------------------------  ------------------------------------------------------------------------------------------------------------------ GFR: Estimated Creatinine Clearance: 57.2 mL/min (by C-G formula based on SCr of 0.69 mg/dL). Liver Function Tests: Recent Labs  Lab 10/29/22 1032  AST 106*  ALT 122*  ALKPHOS 112  BILITOT 0.9  PROT 6.8  ALBUMIN 3.5   No results for input(s): "LIPASE", "AMYLASE" in the last 168 hours. No results for input(s): "AMMONIA" in the last 168 hours. Coagulation Profile: No results for  input(s): "INR", "PROTIME" in the last 168 hours. Cardiac Enzymes: No results for input(s): "CKTOTAL", "CKMB", "CKMBINDEX", "TROPONINI" in the last 168 hours. BNP (last 3 results) No results for input(s): "PROBNP" in the last 8760 hours. HbA1C: No results for input(s): "HGBA1C" in the last 72 hours. CBG: No results for input(s): "GLUCAP" in the last 168 hours. Lipid Profile: No results for input(s): "CHOL", "HDL", "LDLCALC", "TRIG", "CHOLHDL", "LDLDIRECT" in the last 72 hours. Thyroid Function Tests: No results for input(s): "TSH", "T4TOTAL", "FREET4", "T3FREE", "THYROIDAB" in the last 72 hours. Anemia Panel: No results for input(s): "VITAMINB12", "FOLATE", "FERRITIN", "TIBC", "IRON", "RETICCTPCT" in the last 72 hours.  --------------------------------------------------------------------------------------------------------------- Urine analysis:    Component Value Date/Time   BILIRUBINUR negative 04/16/2019 1152   KETONESUR negative 04/16/2019 1152   UROBILINOGEN 0.2 04/16/2019 1152   NITRITE Negative 04/16/2019 1152   LEUKOCYTESUR Negative 04/16/2019 1152      Imaging Results:    DG Hand 2 View Right  Result Date: 10/29/2022 CLINICAL DATA:  Right hand redness and swelling after cat bite EXAM: RIGHT HAND - 2 VIEW COMPARISON:  None Available. FINDINGS: There is no evidence of fracture or dislocation. No erosion or  periosteal elevation. Moderate osteoarthritic changes of the hand and wrist. Prominent soft tissue swelling. No radiopaque foreign body. No soft tissue gas. IMPRESSION: 1. Prominent soft tissue swelling of the right hand. No radiopaque foreign body or soft tissue gas. 2. No acute osseous abnormality. Electronically Signed   By: Duanne Guess D.O.   On: 10/29/2022 10:52    changes   Assessment & Plan:    Principal Problem:   Cat bite of forearm   Cat bite of hand and forearm-x-ray of the hand shows no foreign body or soft tissue gas.  Patient started on IV Unasyn.   Will continue with IV Unasyn. Hyponatremia-sodium is 127, check TSH.  Patient has hypothyroidism.  Will give normal saline at 75 mL/h for 12 hours.  Follow serum sodium in a.m. patient was offered rabies vaccination however patient has refused to get rabies vaccine at this time. Hypertension-continue losartan Hypokalemia-potassium is 3.4, will replace potassium and follow BMP in am.   DVT Prophylaxis-   Lovenox  AM Labs Ordered, also please review Full Orders  Family Communication: Admission, patients condition and plan of care including tests being ordered have been discussed with the patient  who indicate understanding and agree with the plan and Code Status.  Code Status: Full code  Admission status: Observation          * I certify that at the point of admission it is my clinical judgment that the patient will require inpatient hospital care spanning beyond 2 midnights from the point of admission due to high intensity of service, high risk for further deterioration and high frequency of surveillance required.*  Time spent in minutes : 60 minutes   Archer Vise S Katye Valek M.D

## 2022-10-29 NOTE — ED Triage Notes (Signed)
Pt received a bite from her home cat on Weds. Pt went to UC and received 2 antibiotics. Pt c/o redness and swelling that is increasing up and down her arm and right hand.

## 2022-10-30 DIAGNOSIS — E871 Hypo-osmolality and hyponatremia: Secondary | ICD-10-CM | POA: Diagnosis not present

## 2022-10-30 DIAGNOSIS — R7401 Elevation of levels of liver transaminase levels: Secondary | ICD-10-CM

## 2022-10-30 DIAGNOSIS — E876 Hypokalemia: Secondary | ICD-10-CM

## 2022-10-30 DIAGNOSIS — W5501XA Bitten by cat, initial encounter: Secondary | ICD-10-CM | POA: Diagnosis not present

## 2022-10-30 DIAGNOSIS — S51851A Open bite of right forearm, initial encounter: Secondary | ICD-10-CM | POA: Diagnosis not present

## 2022-10-30 LAB — COMPREHENSIVE METABOLIC PANEL
ALT: 77 U/L — ABNORMAL HIGH (ref 0–44)
AST: 43 U/L — ABNORMAL HIGH (ref 15–41)
Albumin: 3 g/dL — ABNORMAL LOW (ref 3.5–5.0)
Alkaline Phosphatase: 96 U/L (ref 38–126)
Anion gap: 8 (ref 5–15)
BUN: 5 mg/dL — ABNORMAL LOW (ref 8–23)
CO2: 21 mmol/L — ABNORMAL LOW (ref 22–32)
Calcium: 8.4 mg/dL — ABNORMAL LOW (ref 8.9–10.3)
Chloride: 102 mmol/L (ref 98–111)
Creatinine, Ser: 0.61 mg/dL (ref 0.44–1.00)
GFR, Estimated: >60 mL/min (ref >60)
Glucose, Bld: 99 mg/dL (ref 70–99)
Potassium: 3.9 mmol/L (ref 3.5–5.1)
Sodium: 131 mmol/L — ABNORMAL LOW (ref 135–145)
Total Bilirubin: 0.9 mg/dL (ref 0.3–1.2)
Total Protein: 5.8 g/dL — ABNORMAL LOW (ref 6.5–8.1)

## 2022-10-30 LAB — CBC
HCT: 35.1 % — ABNORMAL LOW (ref 36.0–46.0)
Hemoglobin: 12 g/dL (ref 12.0–15.0)
MCH: 28.8 pg (ref 26.0–34.0)
MCHC: 34.2 g/dL (ref 30.0–36.0)
MCV: 84.4 fL (ref 80.0–100.0)
Platelets: 209 10*3/uL (ref 150–400)
RBC: 4.16 MIL/uL (ref 3.87–5.11)
RDW: 13.2 % (ref 11.5–15.5)
WBC: 6.6 10*3/uL (ref 4.0–10.5)
nRBC: 0 % (ref 0.0–0.2)

## 2022-10-30 LAB — CULTURE, BLOOD (ROUTINE X 2)

## 2022-10-30 MED ORDER — AMOXICILLIN-POT CLAVULANATE 875-125 MG PO TABS: 1.0000 | ORAL_TABLET | Freq: Two times a day (BID) | ORAL | 0 refills | Status: DC

## 2022-10-30 MED ORDER — KETOROLAC TROMETHAMINE 15 MG/ML IJ SOLN
15.0000 mg | Freq: Once | INTRAMUSCULAR | Status: AC
  Administered 2022-10-30: 15 mg via INTRAVENOUS
  Filled 2022-10-30: qty 1

## 2022-10-30 NOTE — Consult Note (Signed)
ORTHOPAEDIC CONSULTATION  REQUESTING PHYSICIAN: Meredeth Ide, MD  ASSESSMENT AND PLAN: 72 y.o. female with the following: Right hand cat bite; associated cellulitis  Orthopedics recommends admission to a medical service and we will provide consultation and follow along.  Swelling, pain and redness improving with IV antibiotics.  Consider adding soaks.  Elevate hand to help with swelling.  NSAIDs as needed  - Weight Bearing Status/Activity: WBAT   - Additional recommended labs/tests: None  -VTE Prophylaxis: PRN  - Pain control: PRN  - Follow-up plan: As needed  -Procedures: None  Chief Complaint: R hand pain  HPI: Julie May is a 72 y.o. female with PMH as listed below who presents to the ED after an increase in pain, redness and swelling to her right hand.  She was bitten by her cat last week.  She went to an urgent care the next day.  She took Augmentin for 2 days but her symptoms worsened.  After continued swelling, worsening of pain and redness, she presented to the ED.  She denies fevers and chills.  She feels well otherwise.  Since admission, her swelling has improved.  She states she has more motion in her hand than she did before admission.    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 Elie Confer Spine   TONSILECTOMY, ADENOIDECTOMY, BILATERAL MYRINGOTOMY AND TUBES     TONSILLECTOMY     uterine polyp removal      Social History   Socioeconomic History   Marital status: Married    Spouse name: Lorella Nimrod   Number of children: 0   Years of education: Not on file   Highest education level: Not on file  Occupational History   Occupation: Conservator, museum/gallery   Tobacco Use   Smoking status: Never   Smokeless tobacco: Never  Vaping Use   Vaping Use: Never used  Substance and Sexual Activity   Alcohol use: Yes    Comment: occ.   Drug use: No   Sexual activity: Yes    Birth control/protection: Post-menopausal  Other Topics Concern   Not on file  Social History Narrative   Married x 36 years 2022.   Social Determinants of Health   Financial Resource Strain: Low Risk  (04/28/2021)   Overall Financial Resource Strain (CARDIA)    Difficulty of Paying Living Expenses: Not hard at all  Food Insecurity: No Food Insecurity (10/29/2022)   Hunger Vital Sign    Worried About Running Out of Food in the Last Year: Never true    Ran Out of Food in the Last Year: Never true  Transportation Needs:  No Transportation Needs (10/29/2022)   PRAPARE - Administrator, Civil Service (Medical): No    Lack of Transportation (Non-Medical): No  Physical Activity: Sufficiently Active (04/28/2021)   Exercise Vital Sign    Days of Exercise per Week: 5 days    Minutes of Exercise per Session: 30 min  Stress: No Stress Concern Present (04/28/2021)   [employer] of Occupational Health - Occupational Stress Questionnaire    Feeling of Stress : Not at all  Social Connections: Socially Integrated (04/28/2021)   Social Connection and Isolation Panel [NHANES]    Frequency of Communication with Friends and Family: More than three times a week    Frequency of Social Gatherings with Friends and  Family: More than three times a week    Attends Religious Services: More than 4 times per year    Active Member of [employer] or Organizations: Yes    Attends Engineer, structural: More than 4 times per year    Marital Status: Married   Family History  Problem Relation Age of Onset   Heart attack Father    Stroke Mother    Hypertension Mother    Thyroid disease Sister    Hyperlipidemia Brother    Heart attack Brother    Heart disease Brother    Arthritis Other        family history    Hypertension Other        family history    Stroke Other        family history    Heart disease Other        family history    No Known Allergies Prior to Admission medications   Medication Sig Start Date End Date Taking? Authorizing Provider  amoxicillin-clavulanate (AUGMENTIN) 875-125 MG tablet Take 1 tablet by mouth every 12 (twelve) hours. 10/27/22  Yes Leath-Warren, Sadie Haber, NP  Cholecalciferol (VITAMIN D) 2000 units CAPS Take 2,000 Units by mouth daily with lunch.    Yes [provider]  losartan (COZAAR) 25 MG tablet TAKE 1 TABLET(25 MG) BY MOUTH DAILY Patient taking differently: Take 25 mg by mouth daily. 09/08/22  Yes Roma Kayser, MD  TIROSINT 50 MCG CAPS Take 1 capsule (50 mcg total) by mouth daily before breakfast. 10/13/22  Yes Nida, Denman George, MD   DG Hand 2 View Right  Result Date: 10/29/2022 CLINICAL DATA:  Right hand redness and swelling after cat bite EXAM: RIGHT HAND - 2 VIEW COMPARISON:  None Available. FINDINGS: There is no evidence of fracture or dislocation. No erosion or periosteal elevation. Moderate osteoarthritic changes of the hand and wrist. Prominent soft tissue swelling. No radiopaque foreign body. No soft tissue gas. IMPRESSION: 1. Prominent soft tissue swelling of the right hand. No radiopaque foreign body or soft tissue gas. 2. No acute osseous abnormality. Electronically Signed   By: Duanne Guess D.O.   On: 10/29/2022 10:52   Family  History Reviewed and non-contributory, no pertinent history of problems with bleeding or anesthesia    Review of Systems No fevers or chills No numbness or tingling No chest pain No shortness of breath No bowel or bladder dysfunction No GI distress No headaches    OBJECTIVE  General: Alert, no acute distress Cardiovascular: Warm extremities noted Respiratory: No cyanosis, no use of accessory musculature GI: No organomegaly, abdomen is soft and non-tender Skin: No lesions in the area of chief complaint other than those listed below in MSK exam.  Neurologic: Sensation intact distally save  for the below mentioned MSK exam Psychiatric: Patient is competent for consent with normal mood and affect Lymphatic: No swelling obvious and reported other than the area involved in the exam below Extremities  RUE:       No fluctuance.  No drainage.  Sensation intact.    Test Results Imaging Negative right hand XR  Labs cbc Recent Labs    10/29/22 1032 10/30/22 0535  WBC 7.7 6.6  HGB 13.3 12.0  HCT 38.4 35.1*  PLT 228 209     Recent Labs    10/29/22 1032 10/30/22 0535  NA 127* 131*  K 3.4* 3.9  CL 93* 102  CO2 24 21*  GLUCOSE 132* 99  BUN 8 5*  CREATININE 0.69 0.61  CALCIUM 8.6* 8.4*

## 2022-10-30 NOTE — Progress Notes (Signed)
Pt discharged via WC to POV with all personal belongings in her possession. 

## 2022-10-30 NOTE — Care Management Obs Status (Signed)
MEDICARE OBSERVATION STATUS NOTIFICATION   Patient Details  Name: Julie May MRN: [id number] Date of Birth: [birth year]   Medicare Observation Status Notification Given:  Yes    Tylea Hise [name] Dolly, LCSW 10/30/2022, 10:54 AM

## 2022-10-30 NOTE — Progress Notes (Addendum)
[facility]  PROGRESS NOTE  Julie May ZOX:[id number] DOB: August 31, 1950 DOA: 10/29/2022 PCP: Kerri Perches, MD   Brief HPI:   72 y.o. female, history of hypothyroidism, hypertension presented with complaints of cat bite 3 days ago.  Patient said that she was bitten by her cat on Wednesday.  She went to urgent care and was told that she has cellulitis and was given Augmentin.  Patient has been taking Augmentin for 2 days, developed diarrhea.  Also had pain and swelling over the right hand dorsal aspect.  No fever.  No nausea.  Complains of diarrhea but no vomiting.  Denies chest pain or shortness of breath. In the ED, x-ray of the hand showed prominent soft tissue swelling of the right hand.  No radiopaque foreign body or soft tissue gas. Patient started on IV Unasyn. Denies dysuria or abdominal pain    Subjective   Patient seen and examined, and swelling in erythema has significantly improved after starting IV Unasyn.   Assessment/Plan:    Cat bite of hand and forearm-x-ray of the hand shows no foreign body or soft tissue gas.  Patient started on IV Unasyn.  Erythema and pain has significantly improved.  Patient will likely be discharged in next 24 hours.  She already has Augmentin at home.  She will complete 5 days of Augmentin at home.  Hyponatremia-sodium was 127, TSH 3.125.  Sodium has improved to 131 after starting normal saline at 75 mL/h  Follow serum sodium in a.m. patient was offered rabies vaccination however patient has refused to get rabies vaccine at this time.  Hypertension-continue losartan  Hypokalemia-replete.  Transaminitis-likely in setting of acute illness as above.  LFTs are improving.  Will recommend checking LFTs in 2 weeks as outpatient.    Medications     cholecalciferol  2,000 Units Oral Q lunch   enoxaparin (LOVENOX) injection  40 mg Subcutaneous Q24H   ketorolac  15 mg Intravenous Once   levothyroxine  50 mcg Oral QAC breakfast   losartan   25 mg Oral Daily     Data Reviewed:   CBG:  No results for input(s): "GLUCAP" in the last 168 hours.  SpO2: 97 %    Vitals:   10/29/22 1200 10/29/22 1734 10/29/22 1958 10/30/22 0445  BP: 109/62 (!) 142/81 118/69 104/67  Pulse:  87 83 70  Resp: Temp:  98 F (36.7 C) 99 F (37.2 C) 98.4 F (36.9 C)  TempSrc:  Oral Oral Oral  SpO2:  98% 98% 97%  Weight:      Height:          Data Reviewed:  Basic Metabolic Panel: Recent Labs  Lab 10/29/22 1032 10/30/22 0535  NA 127* 131*  K 3.4* 3.9  CL 93* 102  CO2 24 21*  GLUCOSE 132* 99  BUN 8 5*  CREATININE 0.69 0.61  CALCIUM 8.6* 8.4*    CBC: Recent Labs  Lab 10/29/22 1032 10/30/22 0535  WBC 7.7 6.6  HGB 13.3 12.0  HCT 38.4 35.1*  MCV 84.6 84.4  PLT 228 209    LFT Recent Labs  Lab 10/29/22 1032 10/30/22 0535  AST 106* 43*  ALT 122* 77*  ALKPHOS 112 96  BILITOT 0.9 0.9  PROT 6.8 5.8*  ALBUMIN 3.5 3.0*     Antibiotics: Anti-infectives (From admission, onward)    Start     Dose/Rate Route Frequency Ordered Stop   10/29/22 2000  Ampicillin-Sulbactam (UNASYN) 3 g in  sodium chloride 0.9 % 100 mL IVPB        3 g 200 mL/hr over 30 Minutes Intravenous Every 8 hours 10/29/22 1514     10/29/22 1045  ampicillin-sulbactam (UNASYN) 1.5 g in sodium chloride 0.9 % 100 mL IVPB        1.5 g 200 mL/hr over 30 Minutes Intravenous  Once 10/29/22 1032 10/29/22 1208        DVT prophylaxis: Lovenox  Code Status: Full code  Family Communication: Discussed with patient's husband at bedside   CONSULTS    Objective    Physical Examination:   General-appears in no acute distress Heart-S1-S2, regular, no murmur auscultated Lungs-clear to auscultation bilaterally, no wheezing or crackles auscultated Abdomen-soft, nontender, no organomegaly Extremities-mild erythema in right upper extremity, dorsum of the right hand as dusky erythema, very slight edema Neuro-alert, oriented x3, no focal deficit  noted  Status is: Inpatient:             Meredeth Ide   [facility] If 7PM-7AM, please contact night-coverage at [email or web address], Office  [phone]   10/30/2022, 12:54 PM  LOS: 0 days

## 2022-10-30 NOTE — TOC Initial Note (Signed)
Transition of Care [facility]) - Initial/Assessment Note    Patient Details  Name: Julie May MRN: [id number] Date of Birth: 1950-10-04  Transition of Care [facility]) CM/SW Contact:    Catalina Gravel, LCSW Phone Number: 10/30/2022, 11:21 AM  Clinical Narrative:                 .. Transition of Care [facility]) Screening Note   Patient Details  Name: Julie May Date of Birth: Oct 14, 1950   Transition of Care [facility]) CM/SW Contact:    Catalina Gravel, LCSW Phone Number: 10/30/2022, 11:21 AM    Transition of Care Department [facility]) has reviewed patient and no TOC needs have been identified at this time. We will continue to monitor patient advancement through interdisciplinary progression rounds. If new patient transition needs arise, please place a TOC consult.      Barriers to Discharge: Continued Medical Work up   Patient Goals and CMS Choice            Expected Discharge Plan and Services                                              Prior Living Arrangements/Services                       Activities of Daily Living Home Assistive Devices/Equipment: None ADL Screening (condition at time of admission) Patient's cognitive ability adequate to safely complete daily activities?: Yes Is the patient deaf or have difficulty hearing?: No Does the patient have difficulty seeing, even when wearing glasses/contacts?: No Does the patient have difficulty concentrating, remembering, or making decisions?: No Patient able to express need for assistance with ADLs?: Yes Does the patient have difficulty dressing or bathing?: No Independently performs ADLs?: Yes (appropriate for developmental age) Does the patient have difficulty walking or climbing stairs?: No Weakness of Legs: None Weakness of Arms/Hands: None  Permission Sought/Granted                  Emotional Assessment              Admission diagnosis:  Cellulitis of hand [L03.119] Cat bite of  forearm [S51.859A, W55.01XA] Patient Active Problem List   Diagnosis Date Noted   Cat bite of forearm 10/29/2022   Spondylolisthesis of lumbar region 01/28/2020   Callus of foot 11/07/2017   Prediabetes 08/15/2017   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   Osteopenia 04/04/2015   Right foot pain 05/23/2013   Essential hypertension, benign 11/12/2010   Hypothyroidism 01/18/2010   Mixed hyperlipidemia 12/17/2009   PCP:  Kerri Perches, MD Pharmacy:   [facility]ORE [phone] [place], [place] - [address] AT [facility] OF [address] [facility] [address] [place] [address] Phone: [phone] Fax: [phone]  [facility] [phone] - [place],  - [address] AT [facility] OF [address] [place] [address] Phone: [phone] Fax: [phone]     Social Determinants of Health (SDOH) Social History: SDOH Screenings   Food Insecurity: No Food Insecurity (10/29/2022)  Housing: Low Risk  (10/29/2022)  Transportation Needs: No Transportation Needs (10/29/2022)  Utilities: Not At Risk (10/29/2022)  Alcohol Screen: Low Risk  (04/28/2021)  Depression (PHQ2-9): Low Risk  (06/23/2022)  Financial Resource Strain: Low Risk  (  04/28/2021)  Physical Activity: Sufficiently Active (04/28/2021)  Social Connections: Socially Integrated (04/28/2021)  Stress: No Stress Concern Present (04/28/2021)  Tobacco Use: Low Risk  (10/29/2022)   SDOH Interventions:     Readmission Risk Interventions     No data to display

## 2022-10-30 NOTE — Discharge Summary (Signed)
Physician Discharge Summary   Patient: Julie May MRN: [id number] DOB: 1951/05/29  Admit date:     10/29/2022  Discharge date: 10/30/22  Discharge Physician: Meredeth Ide   PCP: Kerri Perches, MD   Recommendations at discharge:   Follow-up PCP in 1 week Check LFTs in 2-weeks  Discharge Diagnoses: Principal Problem:   Cat bite of forearm  Resolved Problems:   * No resolved hospital problems. *  Hospital Course: 72 y.o. female, history of hypothyroidism, hypertension presented with complaints of cat bite 3 days ago.  Patient said that she was bitten by her cat on Wednesday.  She went to urgent care and was told that she has cellulitis and was given Augmentin.  Patient has been taking Augmentin for 2 days, developed diarrhea.  Also had pain and swelling over the right hand dorsal aspect.  No fever.  No nausea.  Complains of diarrhea but no vomiting.  Denies chest pain or shortness of breath. In the ED, x-ray of the hand showed prominent soft tissue swelling of the right hand.  No radiopaque foreign body or soft tissue gas. Patient started on IV Unasyn. Denies dysuria or abdominal pain     Assessment and Plan:  Cat bite of hand and forearm-x-ray of the hand shows no foreign body or soft tissue gas.  Patient started on IV Unasyn.  Erythema and pain has significantly improved.  Patient will likely be discharged in next 24 hours.  She already has Augmentin at home.  She will complete 5 days of Augmentin at home.patient was offered rabies vaccination however patient has refused to get rabies vaccine at this time.  She was seen by hand surgeon Dr. Franco Nones in the hospital.  No surgical intervention recommended.   Hyponatremia-sodium was 127, TSH 3.125.  Sodium has improved to 131 after starting normal saline at 75 mL/h.    Hypertension-continue losartan   Hypokalemia-replete.   Transaminitis-likely in setting of acute illness as above.  LFTs are improving.  Will recommend checking  LFTs in 2 weeks as outpatient.        Consultants: Hydrographic surveyor Dr. Franco Nones Procedures performed:  Disposition: Home Diet recommendation:  Discharge Diet Orders (From admission, onward)     Start     Ordered   10/30/22 0000  Diet - low sodium heart healthy        10/30/22 1355           Regular diet DISCHARGE MEDICATION: Allergies as of 10/30/2022   No Known Allergies      Medication List     TAKE these medications    amoxicillin-clavulanate 875-125 MG tablet Commonly known as: AUGMENTIN Take 1 tablet by mouth every 12 (twelve) hours. Start taking on: October 31, 2022   losartan 25 MG tablet Commonly known as: COZAAR TAKE 1 TABLET(25 MG) BY MOUTH DAILY What changed: See the new instructions.   Tirosint 50 MCG Caps Generic drug: Levothyroxine Sodium Take 1 capsule (50 mcg total) by mouth daily before breakfast.   Vitamin D 50 MCG (2000 UT) Caps Take 2,000 Units by mouth daily with lunch.        Discharge Exam: Filed Weights   10/29/22 1015  Weight: 68 kg   General-appears in no acute distress Heart-S1-S2, regular, no murmur auscultated Lungs-clear to auscultation bilaterally, no wheezing or crackles auscultated Abdomen-soft, nontender, no organomegaly Extremities-no edema in the lower extremities Neuro-alert, oriented x3, no focal deficit noted  Condition at discharge: good  The results of significant  diagnostics from this hospitalization (including imaging, microbiology, ancillary and laboratory) are listed below for reference.   Imaging Studies: DG Hand 2 View Right  Result Date: 10/29/2022 CLINICAL DATA:  Right hand redness and swelling after cat bite EXAM: RIGHT HAND - 2 VIEW COMPARISON:  None Available. FINDINGS: There is no evidence of fracture or dislocation. No erosion or periosteal elevation. Moderate osteoarthritic changes of the hand and wrist. Prominent soft tissue swelling. No radiopaque foreign body. No soft tissue gas. IMPRESSION: 1.  Prominent soft tissue swelling of the right hand. No radiopaque foreign body or soft tissue gas. 2. No acute osseous abnormality. Electronically Signed   By: Duanne Guess D.O.   On: 10/29/2022 10:52    Microbiology: Results for orders placed or performed during the hospital encounter of 10/29/22  Blood culture (routine x 2)     Status: None (Preliminary result)   Collection Time: 10/29/22 10:32 AM   Specimen: BLOOD LEFT WRIST  Result Value Ref Range Status   Specimen Description   Final    BLOOD LEFT WRIST BOTTLES DRAWN AEROBIC AND ANAEROBIC   Special Requests   Final    Blood Culture results may not be optimal due to an excessive volume of blood received in culture bottles   Culture   Final    NO GROWTH < 12 HOURS Performed at [facility], [address], [place], [place] [address]    Report Status PENDING  Incomplete  Blood culture (routine x 2)     Status: None (Preliminary result)   Collection Time: 10/29/22 10:37 AM   Specimen: Right Antecubital; Blood  Result Value Ref Range Status   Specimen Description   Final    RIGHT ANTECUBITAL BOTTLES DRAWN AEROBIC AND ANAEROBIC   Special Requests   Final    Blood Culture results may not be optimal due to an excessive volume of blood received in culture bottles   Culture   Final    NO GROWTH < 12 HOURS Performed at [facility], [address] [place], [place] [address]    Report Status PENDING  Incomplete    Labs: CBC: Recent Labs  Lab 10/29/22 1032 10/30/22 0535  WBC 7.7 6.6  HGB 13.3 12.0  HCT 38.4 35.1*  MCV 84.6 84.4  PLT 228 209   Basic Metabolic Panel: Recent Labs  Lab 10/29/22 1032 10/30/22 0535  NA 127* 131*  K 3.4* 3.9  CL 93* 102  CO2 24 21*  GLUCOSE 132* 99  BUN 8 5*  CREATININE 0.69 0.61  CALCIUM 8.6* 8.4*   Liver Function Tests: Recent Labs  Lab 10/29/22 1032 10/30/22 0535  AST 106* 43*  ALT 122* 77*  ALKPHOS 112 96  BILITOT 0.9 0.9  PROT 6.8 5.8*  ALBUMIN 3.5 3.0*   CBG: No results  for input(s): "GLUCAP" in the last 168 hours.  Discharge time spent: greater than 30 minutes.  Signed: Meredeth Ide, MD [facility] 10/30/2022

## 2022-11-01 LAB — CULTURE, BLOOD (ROUTINE X 2)

## 2022-11-02 ENCOUNTER — Encounter: Payer: Self-pay | Admitting: Family Medicine

## 2022-11-02 ENCOUNTER — Ambulatory Visit (INDEPENDENT_AMBULATORY_CARE_PROVIDER_SITE_OTHER): Payer: Medicare Other | Admitting: Orthopedic Surgery

## 2022-11-02 ENCOUNTER — Encounter: Payer: Self-pay | Admitting: Orthopedic Surgery

## 2022-11-02 ENCOUNTER — Ambulatory Visit (INDEPENDENT_AMBULATORY_CARE_PROVIDER_SITE_OTHER): Payer: Medicare Other | Admitting: Family Medicine

## 2022-11-02 VITALS — BP 150/85 | HR 81 | Ht 65.0 in | Wt 146.0 lb

## 2022-11-02 VITALS — BP 134/80 | HR 71 | Resp 16 | Ht 65.0 in | Wt 146.0 lb

## 2022-11-02 DIAGNOSIS — S51851D Open bite of right forearm, subsequent encounter: Secondary | ICD-10-CM

## 2022-11-02 DIAGNOSIS — R7989 Other specified abnormal findings of blood chemistry: Secondary | ICD-10-CM | POA: Diagnosis not present

## 2022-11-02 DIAGNOSIS — Z09 Encounter for follow-up examination after completed treatment for conditions other than malignant neoplasm: Secondary | ICD-10-CM | POA: Insufficient documentation

## 2022-11-02 DIAGNOSIS — I10 Essential (primary) hypertension: Secondary | ICD-10-CM | POA: Diagnosis not present

## 2022-11-02 DIAGNOSIS — W5501XD Bitten by cat, subsequent encounter: Secondary | ICD-10-CM

## 2022-11-02 DIAGNOSIS — E782 Mixed hyperlipidemia: Secondary | ICD-10-CM | POA: Diagnosis not present

## 2022-11-02 NOTE — Assessment & Plan Note (Signed)
Controlled, no change in medication DASH diet and commitment to daily physical activity for a minimum of 30 minutes discussed and encouraged, as a part of hypertension management. The importance of attaining a healthy weight is also discussed.     11/02/2022   11:54 AM 11/02/2022   11:21 AM 11/02/2022   11:12 AM 10/30/2022    3:45 PM 10/30/2022    4:45 AM 10/29/2022    7:58 PM 10/29/2022    5:34 PM  BP/Weight  Systolic BP 134 144 171 150 104 118 142  Diastolic BP 80 86 91 79 67 69 81  Wt. (Lbs)   146      BMI   24.3 kg/m2

## 2022-11-02 NOTE — Progress Notes (Signed)
Orthopaedic Clinic Return  Assessment: Julie May is a 72 y.o. female with the following: Cat bite, right hand  Plan: Julie May has done well with antibiotic treatment for cellulitis following a cat bite.  She has another 1-2 days of Augmentin.  She should continue with this medication until complete.  Continue with Epsom salt soaks while taking the antibiotics.  No restrictions on activities or range of motion.  At this point, no surgical intervention is warranted.  She will follow-up in clinic as needed.  Follow-up: Return if symptoms worsen or fail to improve.   Subjective:  Chief Complaint  Patient presents with   Animal Bite    R hand thumb cat bite DOI 10/26/22    History of Present Illness: Julie May is a 73 y.o. female who returns to clinic for evaluation of right hand pain and swelling.  She was bit by her cat a little over a week ago.  I evaluated her while she was admitted to the hospital.  Since receiving IV antibiotics, followed by p.o. antibiotics, she has done much better.  Her pain, swelling and redness is all gotten better.  She has been soaking her hand in Epsom salt.  No fevers or chills.  Review of Systems: No fevers or chills No numbness or tingling No chest pain No shortness of breath No bowel or bladder dysfunction No GI distress No headaches   Objective: BP (!) 150/85   Pulse 81   Ht  (1.651 m)   Wt 146 lb (66.2 kg)   BMI 24.30 kg/m   Physical Exam:  Alert and oriented.  No acute distress.  Right hand with persistent redness, and bite marks on the dorsal aspect of the right thumb.  Mild tenderness in this area.  Minimal swelling and redness throughout the rest of the hand and into the forearm.  There is no drainage.  Small scab is appreciated.  Overall, the redness, swelling and appearance of the hand is much better compared to my initial evaluation in the hospital.  IMAGING: I personally ordered and reviewed the following images:  No  new x-rays were obtained today.   Oliver Barre, MD 11/02/2022 2:16 PM

## 2022-11-02 NOTE — Progress Notes (Signed)
   Julie May     MRN: [id number]      DOB: 1951-03-05   HPI [name] is here for follow up of recent hospitalization from 4/20 to 10/30/2022, when she was admitted  with a dx of cat bite of forearm, occurred on 4/17, cat is her own 72 y/o cat Had been started on augmentin orally, when no improvement , went to the hospital and had dramatic response to IV antibiotic . White count never elevated,  no shift, negative blood cultures From her report her forearm to elbow was red, approx 40 top 50 of surfce, this is markedly improved, redness and swelling limited to wrist , max diameter approx 5 cm, no drainage from site States Ortho will look at it today after she leaves here , he saw her in the hospital C/o syncopal episode with blood draw, notes that report states that excess blood submitted in blood cultures  ROS Denies recent fever or chills. Denies sinus pressure, nasal congestion, ear pain or sore throat. Denies chest congestion, productive cough or wheezing. Denies chest pains, palpitations and leg swelling Denies abdominal pain, nausea, vomiting,diarrhea or constipation.   Denies dysuria, frequency, hesitancy or incontinence. Denies joint pain, swelling and limitation in mobility. Denies headaches, seizures, numbness, or tingling. Denies depression, anxiety or insomnia.  PE  BP 134/80   Pulse 71   Resp 16   Ht  (1.651 m)   Wt 146 lb (66.2 kg)   SpO2 95%   BMI 24.30 kg/m   Patient alert and oriented and in no cardiopulmonary distress.  HEENT: No facial asymmetry, EOMI,      Chest: Clear to auscultation bilaterally.  CVS: S1, S2 no murmurs, no S3.Regular rate.   Ext: No edema  MS: Adequate ROM spine, shoulders, hips and knees.  Skin: Erythema and swelling of skin of right wrist, no purulent drainage, no excessive warmth  Psych: Good eye contact, normal affect. Memory intact not anxious or depressed appearing.  CNS: CN 2-12 intact, power,  normal throughout.no  focal deficits noted.   Assessment & Plan  Cat bite of forearm  Improvement in extent of cellulitis , no fever, negative blood cultures, pt to complete oral Augmentin, no additional antibiotic prescribed at visit  Essential hypertension, benign Controlled, no change in medication DASH diet and commitment to daily physical activity for a minimum of 30 minutes discussed and encouraged, as a part of hypertension management. The importance of attaining a healthy weight is also discussed.     11/02/2022   11:54 AM 11/02/2022   11:21 AM 11/02/2022   11:12 AM 10/30/2022    3:45 PM 10/30/2022    4:45 AM 10/29/2022    7:58 PM 10/29/2022    5:34 PM  BP/Weight  Systolic BP 134 144 171 150 104 118 142  Diastolic BP 80 86 91 79 67 69 81  Wt. (Lbs)   146      BMI   24.3 kg/m2           Elevated LFTs Acutely elevated liver enzymes which started to correct while in hospital, rept lab in 2 weeks  Hospital discharge follow-up Patient in for follow up of recent hospitalization. Discharge summary, and laboratory and radiology data are reviewed, and any questions or concerns  are discussed. Specific issues requiring follow up are specifically addressed.

## 2022-11-02 NOTE — Assessment & Plan Note (Signed)
Improvement in extent of cellulitis , no fever, negative blood cultures, pt to complete oral Augmentin, no additional antibiotic prescribed at visit

## 2022-11-02 NOTE — Patient Instructions (Addendum)
F/U in 6 months, call if you need me sooner  Goods that you are improved   Liver function test in2nd week in May, Please put written note on order to limit volume of blood drawn to minimal, has had syncope twice with blood draws in the past  Thanks for choosing [facility], we consider it a privelige to serve you.

## 2022-11-02 NOTE — Assessment & Plan Note (Signed)
Acutely elevated liver enzymes which started to correct while in hospital, rept lab in 2 weeks

## 2022-11-02 NOTE — Assessment & Plan Note (Signed)
Patient in for follow up of recent hospitalization. Discharge summary, and laboratory and radiology data are reviewed, and any questions or concerns  are discussed. Specific issues requiring follow up are specifically addressed.  

## 2022-11-18 DIAGNOSIS — E782 Mixed hyperlipidemia: Secondary | ICD-10-CM | POA: Diagnosis not present

## 2022-11-19 LAB — HEPATIC FUNCTION PANEL
ALT: 17 IU/L (ref 0–32)
AST: 21 IU/L (ref 0–40)
Albumin: 4.3 g/dL (ref 3.8–4.8)
Alkaline Phosphatase: 111 IU/L (ref 44–121)
Bilirubin Total: 0.5 mg/dL (ref 0.0–1.2)
Bilirubin, Direct: 0.15 mg/dL (ref 0.00–0.40)
Total Protein: 6.4 g/dL (ref 6.0–8.5)

## 2022-12-04 ENCOUNTER — Other Ambulatory Visit: Payer: Self-pay | Admitting: "Endocrinology

## 2022-12-06 ENCOUNTER — Other Ambulatory Visit (HOSPITAL_COMMUNITY): Payer: Self-pay | Admitting: Family Medicine

## 2022-12-06 DIAGNOSIS — N6320 Unspecified lump in the left breast, unspecified quadrant: Secondary | ICD-10-CM

## 2023-01-05 DIAGNOSIS — H25813 Combined forms of age-related cataract, bilateral: Secondary | ICD-10-CM | POA: Diagnosis not present

## 2023-01-13 ENCOUNTER — Other Ambulatory Visit: Payer: Self-pay

## 2023-01-17 ENCOUNTER — Ambulatory Visit (HOSPITAL_COMMUNITY)
Admission: RE | Admit: 2023-01-17 | Discharge: 2023-01-17 | Disposition: A | Discharge status: Home / Self Care | Payer: Medicare Other | Source: Ambulatory Visit | Attending: Family Medicine | Admitting: Family Medicine

## 2023-01-17 ENCOUNTER — Other Ambulatory Visit: Payer: Self-pay

## 2023-01-17 DIAGNOSIS — N6324 Unspecified lump in the left breast, lower inner quadrant: Secondary | ICD-10-CM | POA: Diagnosis not present

## 2023-01-17 DIAGNOSIS — N6320 Unspecified lump in the left breast, unspecified quadrant: Secondary | ICD-10-CM | POA: Diagnosis not present

## 2023-01-17 DIAGNOSIS — R928 Other abnormal and inconclusive findings on diagnostic imaging of breast: Secondary | ICD-10-CM | POA: Diagnosis not present

## 2023-01-20 ENCOUNTER — Other Ambulatory Visit: Payer: Self-pay

## 2023-01-20 DIAGNOSIS — R928 Other abnormal and inconclusive findings on diagnostic imaging of breast: Secondary | ICD-10-CM

## 2023-01-25 ENCOUNTER — Encounter: Payer: Self-pay | Admitting: Family Medicine

## 2023-01-31 ENCOUNTER — Other Ambulatory Visit: Payer: Self-pay

## 2023-01-31 DIAGNOSIS — R2681 Unsteadiness on feet: Secondary | ICD-10-CM

## 2023-01-31 NOTE — Telephone Encounter (Signed)
Referral sent 

## 2023-02-08 DIAGNOSIS — M6281 Muscle weakness (generalized): Secondary | ICD-10-CM | POA: Diagnosis not present

## 2023-02-08 DIAGNOSIS — R2681 Unsteadiness on feet: Secondary | ICD-10-CM | POA: Diagnosis not present

## 2023-02-10 DIAGNOSIS — R2681 Unsteadiness on feet: Secondary | ICD-10-CM | POA: Diagnosis not present

## 2023-02-10 DIAGNOSIS — M6281 Muscle weakness (generalized): Secondary | ICD-10-CM | POA: Diagnosis not present

## 2023-02-13 DIAGNOSIS — M6281 Muscle weakness (generalized): Secondary | ICD-10-CM | POA: Diagnosis not present

## 2023-02-13 DIAGNOSIS — R2681 Unsteadiness on feet: Secondary | ICD-10-CM | POA: Diagnosis not present

## 2023-02-15 DIAGNOSIS — R2681 Unsteadiness on feet: Secondary | ICD-10-CM | POA: Diagnosis not present

## 2023-02-15 DIAGNOSIS — M6281 Muscle weakness (generalized): Secondary | ICD-10-CM | POA: Diagnosis not present

## 2023-02-20 DIAGNOSIS — R2681 Unsteadiness on feet: Secondary | ICD-10-CM | POA: Diagnosis not present

## 2023-02-20 DIAGNOSIS — M6281 Muscle weakness (generalized): Secondary | ICD-10-CM | POA: Diagnosis not present

## 2023-02-22 DIAGNOSIS — M6281 Muscle weakness (generalized): Secondary | ICD-10-CM | POA: Diagnosis not present

## 2023-02-22 DIAGNOSIS — R2681 Unsteadiness on feet: Secondary | ICD-10-CM | POA: Diagnosis not present

## 2023-02-28 DIAGNOSIS — M6281 Muscle weakness (generalized): Secondary | ICD-10-CM | POA: Diagnosis not present

## 2023-02-28 DIAGNOSIS — R2681 Unsteadiness on feet: Secondary | ICD-10-CM | POA: Diagnosis not present

## 2023-03-02 DIAGNOSIS — R2681 Unsteadiness on feet: Secondary | ICD-10-CM | POA: Diagnosis not present

## 2023-03-02 DIAGNOSIS — M6281 Muscle weakness (generalized): Secondary | ICD-10-CM | POA: Diagnosis not present

## 2023-03-06 DIAGNOSIS — R2681 Unsteadiness on feet: Secondary | ICD-10-CM | POA: Diagnosis not present

## 2023-03-06 DIAGNOSIS — M6281 Muscle weakness (generalized): Secondary | ICD-10-CM | POA: Diagnosis not present

## 2023-03-08 DIAGNOSIS — R2681 Unsteadiness on feet: Secondary | ICD-10-CM | POA: Diagnosis not present

## 2023-03-08 DIAGNOSIS — M6281 Muscle weakness (generalized): Secondary | ICD-10-CM | POA: Diagnosis not present

## 2023-03-09 ENCOUNTER — Other Ambulatory Visit: Payer: Self-pay | Admitting: "Endocrinology

## 2023-03-19 ENCOUNTER — Ambulatory Visit
Admission: RE | Admit: 2023-03-19 | Discharge: 2023-03-19 | Disposition: A | Discharge status: Home / Self Care | Payer: Medicare Other | Source: Ambulatory Visit | Attending: Physician Assistant | Admitting: Physician Assistant

## 2023-03-19 VITALS — BP 166/83 | HR 90 | Temp 98.0°F | Resp 18

## 2023-03-19 DIAGNOSIS — N390 Urinary tract infection, site not specified: Secondary | ICD-10-CM | POA: Insufficient documentation

## 2023-03-19 DIAGNOSIS — K5900 Constipation, unspecified: Secondary | ICD-10-CM | POA: Diagnosis not present

## 2023-03-19 LAB — POCT URINALYSIS DIP (MANUAL ENTRY)
Bilirubin, UA: NEGATIVE
Glucose, UA: NEGATIVE mg/dL
Ketones, POC UA: NEGATIVE mg/dL
Nitrite, UA: NEGATIVE
Protein Ur, POC: NEGATIVE mg/dL
Spec Grav, UA: <=1.005 — AB (ref 1.010–1.025)
Urobilinogen, UA: 0.2 U/dL
pH, UA: 6.5 (ref 5.0–8.0)

## 2023-03-19 MED ORDER — CEPHALEXIN 500 MG PO CAPS: 500.0000 mg | ORAL_CAPSULE | Freq: Two times a day (BID) | ORAL | 0 refills | Status: DC

## 2023-03-19 NOTE — Discharge Instructions (Signed)
We are treating you for urinary tract infection.  Take cephalexin twice daily for 7 days.  We are sending urine for culture and we will contact you if we need to change her antibiotics.  Make sure that you drink plenty of fluid.  If anything worsens and you have abdominal pain, pelvic pain, fever, nausea, vomiting you need to be seen immediately.  Please follow-up with your primary care within a few weeks for a repeat urine to make sure that the small amount of blood noted today goes away when we clear the infection.  Antibiotics will often cause diarrhea so this may help with your constipation.  On a regular basis I recommend increasing your fiber such as using a fiber supplement.  Make sure that you drink plenty of fluid.  If at any point have severe abdominal pain, fever, difficulty passing stool or gas, nausea/vomiting you need to be seen immediately.

## 2023-03-19 NOTE — ED Triage Notes (Signed)
Pt reports she has urinary urgency, burning with urination, cloudy and smelly urine, and low stream x 2 days

## 2023-03-19 NOTE — ED Provider Notes (Signed)
[facility]    CSN: [id number] Arrival date & time: 03/19/23  1044      History   Chief Complaint No chief complaint on file.   HPI Julie May is a 72 y.o. female.   Patient presents today with a 2-day history of UTI symptoms.  She reports urinary frequency, urinary urgency, dysuria, cloudy urine, malodorous urine.  She denies any abdominal pain, pelvic pain, fever, nausea, vomiting.  She has had UTIs in the past but has not seen urology as they are infrequent.  Last 1 was several years ago.  She was treated with antibiotics (Augmentin) for cat bite in April 2024 but has not had any additional antibiotics since that time.  She denies history of nephrolithiasis, single kidney, self-catheterization.  Denies any recent dental procedure.  She denies history of diabetes and does not take SGLT2 inhibitor.  She denies any abdominal pain but does report she has had some constipation.  She did have a normal bowel movement and is felt much better today.  She has been taking Colace which has provided improvement of symptoms.  She is passing gas normally.    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    Patient Active Problem List   Diagnosis Date Noted   Elevated LFTs 11/02/2022   Hospital discharge follow-up 11/02/2022   Cat bite of forearm 10/29/2022   Spondylolisthesis of lumbar region 01/28/2020   Callus of foot 11/07/2017   Prediabetes 08/15/2017    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 Elie Confer Spine   TONSILECTOMY, ADENOIDECTOMY, BILATERAL MYRINGOTOMY AND TUBES     TONSILLECTOMY     uterine polyp removal       OB History   No obstetric history on file.      Home Medications    Prior to Admission medications   Medication Sig Start Date End Date Taking? Authorizing Provider  cephALEXin (KEFLEX) 500 MG capsule Take 1 capsule (500 mg total) by mouth 2 (two) times daily. 03/19/23  Yes Waleska Buttery, Noberto Retort, PA-C  Cholecalciferol (VITAMIN D) 2000 units CAPS Take 2,000 Units by mouth daily with lunch.     [provider]  losartan (COZAAR) 25 MG tablet TAKE 1 TABLET(25 MG) BY MOUTH DAILY 03/10/23   Roma Kayser, MD  TIROSINT 50 MCG CAPS Take 1 capsule (50 mcg total) by mouth daily before breakfast. 10/13/22   Roma Kayser, MD    Family History Family History  Problem Relation  Age of Onset   Heart attack Father    Stroke Mother    Hypertension Mother    Thyroid disease Sister    Hyperlipidemia Brother    Heart attack Brother    Heart disease Brother    Arthritis Other        family history    Hypertension Other        family history    Stroke Other        family history    Heart disease Other        family history     Social History Social History   Tobacco Use   Smoking status: Never   Smokeless tobacco: Never  Vaping Use   Vaping status: Never Used  Substance Use Topics   Alcohol use: Yes    Comment: occ.   Drug use: No     Allergies   Patient has no known  allergies.   Review of Systems Review of Systems  Constitutional:  Positive for activity change. Negative for appetite change, fatigue and fever.  Respiratory:  Negative for cough and shortness of breath.   Cardiovascular:  Negative for chest pain.  Gastrointestinal:  Negative for abdominal pain, constipation (resolved), diarrhea, nausea and vomiting.  Genitourinary:  Positive for dysuria, frequency and urgency. Negative for flank pain, pelvic pain, vaginal bleeding, vaginal discharge and vaginal pain.  Musculoskeletal:  Negative for arthralgias, back pain and myalgias.     Physical Exam Triage Vital Signs ED Triage Vitals  Encounter Vitals Group     BP 03/19/23 1111 (!) 166/83     Systolic BP Percentile --      Diastolic BP Percentile --      Pulse Rate 03/19/23 1111 90     Resp 03/19/23 1111 18     Temp 03/19/23 1111 98 F (36.7 C)     Temp Source 03/19/23 1111 Oral     SpO2 03/19/23 1111 96 %     Weight --      Height --      Head Circumference --      Peak Flow --      Pain Score 03/19/23 1112 0     Pain Loc --      Pain Education --      Exclude from Growth Chart --    No data found.  Updated Vital Signs BP (!) 166/83 (BP Location: Right Arm)   Pulse 90   Temp 98 F (36.7 C) (Oral)   Resp 18   SpO2 96%   Visual Acuity Right Eye Distance:   Left Eye Distance:   Bilateral Distance:    Right Eye Near:   Left Eye Near:    Bilateral Near:     Physical Exam Vitals reviewed.  Constitutional:      General: She is awake. She is not in acute distress.    Appearance: Normal appearance. She is well-developed. She is not ill-appearing.     Comments: Very pleasant female appears stated age in no acute distress sitting comfortably in exam room  HENT:     Head: Normocephalic and atraumatic.  Cardiovascular:     Rate and Rhythm: Normal rate and regular rhythm.     Heart sounds: Normal heart sounds, S1 normal and S2 normal. No murmur heard. Pulmonary:     Effort:  Pulmonary effort is normal.     Breath sounds: Normal breath sounds. No wheezing, rhonchi or rales.     Comments: Clear to auscultation  bilaterally Abdominal:     General: Bowel sounds are normal.     Palpations: Abdomen is soft.     Tenderness: There is no abdominal tenderness. There is no right CVA tenderness, left CVA tenderness, guarding or rebound.     Comments: Benign abdominal exam  Psychiatric:        Behavior: Behavior is cooperative.      UC Treatments / Results  Labs (all labs ordered are listed, but only abnormal results are displayed) Labs Reviewed  POCT URINALYSIS DIP (MANUAL ENTRY) - Abnormal; Notable for the following components:      Result Value   Clarity, UA cloudy (*)    Spec Grav, UA <=1.005 (*)    Blood, UA small (*)    Leukocytes, UA Large (3+) (*)    All other components within normal limits  URINE CULTURE    EKG   Radiology No results found.  Procedures Procedures (including critical care time)  Medications Ordered in UC Medications - No data to display  Initial Impression / Assessment and Plan / UC Course  I have reviewed the triage vital signs and the nursing notes.  Pertinent labs & imaging results that were available during my care of the patient were reviewed by me and considered in my medical decision making (see chart for details).     Patient is well-appearing, afebrile, nontoxic, nontachycardic.  Vital signs of physical exam are reassuring with no indication for emergent evaluation or imaging.  Urine did show evidence of infection.  Will send this for culture but in the meantime start cephalexin twice daily for 7 days.  No indication for dose adjustment based on metabolic panel from 10/30/2022 with creatinine of 0.61 and calculated creatinine clearance of 85.87 mL/min.  She did have some blood in her urine and recommend that she follow-up with her primary care to have this rechecked in a few weeks to ensure that the blood noted today  resolves with clearing of infection.  She was encouraged to rest and drink plenty of fluid.  Discussed that if she has any worsening or changing symptoms including abdominal pain, pelvic pain, fever, nausea, vomiting she needs to be seen immediately.  We discussed that constipation can contribute to urinary symptoms.  She has had a normal bowel movement and there is no concern for obstruction.  Her vital signs and physical exam are reassuring.  Recommended that she increase the fiber in her diet and use a fiber supplement.  She is also to push fluids.  Discussed that if anything worsens or changes and she has difficulty passing stool or gas, severe abdominal pain, nausea, vomiting she needs to be seen immediately.  Final Clinical Impressions(s) / UC Diagnoses   Final diagnoses:  Acute UTI  Constipation, unspecified constipation type     Discharge Instructions      We are treating you for urinary tract infection.  Take cephalexin twice daily for 7 days.  We are sending urine for culture and we will contact you if we need to change her antibiotics.  Make sure that you drink plenty of fluid.  If anything worsens and you have abdominal pain, pelvic pain, fever, nausea, vomiting you need to be seen immediately.  Please follow-up with your primary care within a few weeks for a repeat urine to make sure that the small amount of blood noted today goes away when we clear the infection.  Antibiotics will often cause diarrhea so this may help with your constipation.  On  a regular basis I recommend increasing your fiber such as using a fiber supplement.  Make sure that you drink plenty of fluid.  If at any point have severe abdominal pain, fever, difficulty passing stool or gas, nausea/vomiting you need to be seen immediately.     ED Prescriptions     Medication Sig Dispense Auth. Provider   cephALEXin (KEFLEX) 500 MG capsule Take 1 capsule (500 mg total) by mouth 2 (two) times daily. 14 capsule Dulcey Riederer,  Zaeda Mcferran K, PA-C      PDMP not reviewed this encounter.   Jeani Hawking, PA-C 03/19/23 1131

## 2023-03-21 LAB — URINE CULTURE: Culture: 90000 — AB

## 2023-03-28 ENCOUNTER — Other Ambulatory Visit: Payer: Self-pay

## 2023-03-28 ENCOUNTER — Ambulatory Visit
Admission: RE | Admit: 2023-03-28 | Discharge: 2023-03-28 | Disposition: A | Discharge status: Home / Self Care | Payer: Medicare Other | Source: Ambulatory Visit | Attending: Family Medicine | Admitting: Family Medicine

## 2023-03-28 VITALS — BP 150/84 | HR 74 | Temp 97.4°F | Resp 20

## 2023-03-28 DIAGNOSIS — N390 Urinary tract infection, site not specified: Secondary | ICD-10-CM | POA: Insufficient documentation

## 2023-03-28 DIAGNOSIS — E038 Other specified hypothyroidism: Secondary | ICD-10-CM | POA: Diagnosis not present

## 2023-03-28 DIAGNOSIS — E782 Mixed hyperlipidemia: Secondary | ICD-10-CM | POA: Diagnosis not present

## 2023-03-28 LAB — POCT URINALYSIS DIP (MANUAL ENTRY)
Bilirubin, UA: NEGATIVE
Glucose, UA: NEGATIVE mg/dL
Ketones, POC UA: NEGATIVE mg/dL
Nitrite, UA: NEGATIVE
Protein Ur, POC: NEGATIVE mg/dL
Spec Grav, UA: 1.015 (ref 1.010–1.025)
Urobilinogen, UA: 0.2 U/dL
pH, UA: 7.5 (ref 5.0–8.0)

## 2023-03-28 MED ORDER — SULFAMETHOXAZOLE-TRIMETHOPRIM 800-160 MG PO TABS: 1.0000 | ORAL_TABLET | Freq: Two times a day (BID) | ORAL | 0 refills | Status: AC

## 2023-03-28 NOTE — ED Triage Notes (Addendum)
Pt reports continued dysuria nad urinary urgency and reports finished abx for uti on Sunday. Pt reports "i dont think its gone."

## 2023-03-28 NOTE — ED Provider Notes (Signed)
[facility]    CSN: [id number] Arrival date & time: 03/28/23  4098      History   Chief Complaint Chief Complaint  Patient presents with   URI    UTI follow up visit - Entered by patient    HPI Julie May is a 72 y.o. female.   Patient presenting today with ongoing dysuria, urinary urgency following finishing Keflex for UTI that was diagnosed last week 3 days ago.  Denies fever, chills, nausea, vomiting, flank pain.  Has been trying to stay well-hydrated.    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    Patient Active Problem List   Diagnosis Date Noted   Elevated LFTs 11/02/2022   Hospital discharge follow-up 11/02/2022   Cat bite of forearm 10/29/2022   Spondylolisthesis of lumbar region 01/28/2020   Callus of foot 11/07/2017   Prediabetes 08/15/2017   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 Elie Confer Spine   TONSILECTOMY, ADENOIDECTOMY, BILATERAL MYRINGOTOMY AND TUBES     TONSILLECTOMY     uterine polyp removal        OB History   No obstetric history on file.      Home Medications    Prior to Admission medications   Medication Sig Start Date End Date Taking? Authorizing Provider  sulfamethoxazole-trimethoprim (BACTRIM DS) 800-160 MG tablet Take 1 tablet by mouth 2 (two) times daily for 3 days. 03/28/23 03/31/23 Yes Particia Nearing, PA-C  cephALEXin (KEFLEX) 500 MG capsule Take 1 capsule (500 mg total) by mouth 2 (two) times daily. 03/19/23   Raspet, Noberto Retort, PA-C  Cholecalciferol (VITAMIN D) 2000 units CAPS Take 2,000 Units by mouth daily with lunch.     [provider]  losartan (COZAAR) 25 MG tablet TAKE 1 TABLET(25 MG) BY MOUTH DAILY 03/10/23   Roma Kayser, MD  TIROSINT 50 MCG CAPS Take 1 capsule (50 mcg total) by mouth daily before breakfast. 10/13/22   Nida, Denman George, MD    Family History Family History  Problem Relation Age of Onset   Heart attack Father    Stroke Mother    Hypertension Mother    Thyroid disease Sister    Hyperlipidemia Brother    Heart attack Brother    Heart disease Brother    Arthritis Other        family history    Hypertension Other        family history    Stroke Other  family history    Heart disease Other        family history     Social History Social History   Tobacco Use   Smoking status: Never   Smokeless tobacco: Never  Vaping Use   Vaping status: Never Used  Substance Use Topics   Alcohol use: Yes    Comment: occ.   Drug use: No     Allergies   Patient has no known allergies.   Review of Systems Review of Systems Per HPI  Physical Exam Triage Vital Signs ED Triage Vitals  Encounter Vitals Group     BP 03/28/23 0939 (!) 150/84     Systolic BP Percentile --      Diastolic BP Percentile --      Pulse Rate 03/28/23 0939 74     Resp 03/28/23 0939 20     Temp 03/28/23 0939 (!) 97.4 F (36.3 C)     Temp Source 03/28/23 0939 Oral     SpO2 03/28/23 0939 96 %     Weight --      Height --       Head Circumference --      Peak Flow --      Pain Score 03/28/23 0938 0     Pain Loc --      Pain Education --      Exclude from Growth Chart --    No data found.  Updated Vital Signs BP (!) 150/84 (BP Location: Right Arm)   Pulse 74   Temp (!) 97.4 F (36.3 C) (Oral)   Resp 20   SpO2 96%   Visual Acuity Right Eye Distance:   Left Eye Distance:   Bilateral Distance:    Right Eye Near:   Left Eye Near:    Bilateral Near:     Physical Exam Vitals and nursing note reviewed.  Constitutional:      Appearance: Normal appearance. She is not ill-appearing.  HENT:     Head: Atraumatic.  Eyes:     Extraocular Movements: Extraocular movements intact.     Conjunctiva/sclera: Conjunctivae normal.  Cardiovascular:     Rate and Rhythm: Normal rate and regular rhythm.     Heart sounds: Normal heart sounds.  Pulmonary:     Effort: Pulmonary effort is normal.     Breath sounds: Normal breath sounds.  Abdominal:     General: Bowel sounds are normal. There is no distension.     Palpations: Abdomen is soft.     Tenderness: There is no abdominal tenderness. There is no right CVA tenderness, left CVA tenderness or guarding.  Musculoskeletal:        General: Normal range of motion.     Cervical back: Normal range of motion and neck supple.  Skin:    General: Skin is warm and dry.  Neurological:     Mental Status: She is alert and oriented to person, place, and time.  Psychiatric:        Mood and Affect: Mood normal.        Thought Content: Thought content normal.        Judgment: Judgment normal.      UC Treatments / Results  Labs (all labs ordered are listed, but only abnormal results are displayed) Labs Reviewed  POCT URINALYSIS DIP (MANUAL ENTRY) - Abnormal; Notable for the following components:      Result Value   Blood, UA moderate (*)    Leukocytes, UA Large (3+) (*)  All other components within normal limits  URINE CULTURE    EKG   Radiology No results  found.  Procedures Procedures (including critical care time)  Medications Ordered in UC Medications - No data to display  Initial Impression / Assessment and Plan / UC Course  I have reviewed the triage vital signs and the nursing notes.  Pertinent labs & imaging results that were available during my care of the patient were reviewed by me and considered in my medical decision making (see chart for details).     Does still appear to have a urinary tract infection.  Urine culture from previous visit was reviewed and Keflex was susceptible, urine culture again sent in case any changes since then.  Will treat with Bactrim, discussed fluids, supportive home care.  Return for worsening symptoms.  Final Clinical Impressions(s) / UC Diagnoses   Final diagnoses:  Acute lower UTI   Discharge Instructions   None    ED Prescriptions     Medication Sig Dispense Auth. Provider   sulfamethoxazole-trimethoprim (BACTRIM DS) 800-160 MG tablet Take 1 tablet by mouth 2 (two) times daily for 3 days. 6 tablet Particia Nearing, [place]      PDMP not reviewed this encounter.   Particia Nearing, [place] 03/28/23 1014

## 2023-03-30 LAB — URINE CULTURE: Culture: >=100000 — AB

## 2023-04-04 ENCOUNTER — Encounter: Payer: Self-pay | Admitting: "Endocrinology

## 2023-04-04 ENCOUNTER — Ambulatory Visit (INDEPENDENT_AMBULATORY_CARE_PROVIDER_SITE_OTHER): Payer: Medicare Other | Admitting: "Endocrinology

## 2023-04-04 VITALS — BP 122/70 | HR 68 | Ht 65.0 in | Wt 145.2 lb

## 2023-04-04 DIAGNOSIS — E782 Mixed hyperlipidemia: Secondary | ICD-10-CM | POA: Diagnosis not present

## 2023-04-04 DIAGNOSIS — E038 Other specified hypothyroidism: Secondary | ICD-10-CM

## 2023-04-04 DIAGNOSIS — I10 Essential (primary) hypertension: Secondary | ICD-10-CM

## 2023-04-04 DIAGNOSIS — R7303 Prediabetes: Secondary | ICD-10-CM

## 2023-04-04 MED ORDER — TIROSINT 50 MCG PO CAPS: 50.0000 ug | ORAL_CAPSULE | Freq: Every day | ORAL | 1 refills | Status: DC

## 2023-04-04 MED ORDER — ROSUVASTATIN CALCIUM 5 MG PO TABS
5.0000 mg | ORAL_TABLET | Freq: Every day | ORAL | 1 refills | Status: DC
Start: 2023-04-04 — End: 2023-04-30

## 2023-04-04 NOTE — Progress Notes (Signed)
04/04/2023        Endocrinology Follow Up Visit   Subjective:    Patient ID: Julie May, female    DOB: 10-06-1950, PCP Kerri Perches, MD   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 Elie Confer Spine   TONSILECTOMY, ADENOIDECTOMY, BILATERAL MYRINGOTOMY AND TUBES     TONSILLECTOMY     uterine polyp removal      Social History   Socioeconomic History   Marital status: Married    Spouse name: Lorella Nimrod   Number of children: 0   Years of education: Not on file   Highest education level: Bachelor's degree (e.g., BA, AB, BS)  Occupational History   Occupation: Conservator, museum/gallery   Tobacco Use   Smoking status: Never   Smokeless tobacco: Never  Vaping Use   Vaping status: Never Used  Substance and Sexual Activity   Alcohol use: Yes    Comment: occ.   Drug use: No   Sexual activity: Yes    Birth control/protection: Post-menopausal  Other Topics Concern   Not on file  Social History Narrative   Married x 36 years 2022.   Social Determinants of Health   Financial Resource Strain: Low Risk  (10/31/2022)   Overall Financial Resource Strain (CARDIA)    Difficulty of Paying Living Expenses: Not hard at  all  Food Insecurity: No Food Insecurity (10/31/2022)   Hunger Vital Sign    Worried About Running Out of Food in the Last Year: Never true    Ran Out of Food in the Last Year: Never true  Transportation Needs: No Transportation Needs (10/31/2022)   PRAPARE - Administrator, Civil Service (Medical): No    Lack of Transportation (Non-Medical): No  Physical Activity: Sufficiently Active (10/31/2022)   Exercise Vital Sign    Days of Exercise per Week: 5 days    Minutes of Exercise per Session: 30 min  Stress: No Stress Concern Present (10/31/2022)   [employer] of Occupational Health - Occupational Stress Questionnaire    Feeling of Stress : Not at all  Social Connections: Socially Integrated (10/31/2022)   Social Connection and Isolation Panel [NHANES]    Frequency of Communication with Friends and Family: Three times a week    Frequency of Social Gatherings with Friends and Family: Twice a week    Attends Religious Services: More than 4 times per year    Active Member of [employer] or Organizations: Yes    Attends Banker Meetings: More than 4 times per year    Marital Status: Married   Outpatient Encounter Medications as of 04/04/2023  Medication Sig   rosuvastatin (CRESTOR) 5 MG tablet Take 1 tablet (5 mg total) by mouth daily.   Cholecalciferol (VITAMIN D) 2000 units CAPS Take 2,000 Units by mouth daily with lunch.    losartan (COZAAR) 25 MG tablet TAKE 1 TABLET(25 MG) BY MOUTH DAILY   TIROSINT 50 MCG CAPS Take 1 capsule (50 mcg total) by mouth daily before breakfast.   [DISCONTINUED] cephALEXin (KEFLEX) 500 MG capsule Take 1 capsule (500 mg total) by mouth 2 (two) times daily.   [DISCONTINUED] TIROSINT 50 MCG CAPS Take 1 capsule (50 mcg total) by mouth daily before breakfast.   No facility-administered encounter medications on file as of 04/04/2023.   ALLERGIES: No Known Allergies VACCINATION STATUS: Immunization History  Administered Date(s) Administered    Fluad Quad(high Dose 65+) 04/10/2019, 04/13/2020, 04/20/2021, 04/27/2022   Influenza Split 06/22/2011   Influenza Whole 05/18/2006, 04/30/2007, 04/14/2008   Influenza,inj,Quad PF,6+ Mos 05/31/2013, 05/28/2014, 04/14/2015, 04/27/2016, 05/16/2017, 04/13/2018   Moderna SARS-COV2 Booster Vaccination 04/29/2021   Moderna Sars-Covid-2 Vaccination 08/23/2019, 09/20/2019, 05/23/2020, 10/30/2020   Pneumococcal Conjugate-13 09/14/2018   Td 08/13/2009   Tdap 10/27/2022   Zoster Recombinant(Shingrix) 03/30/2021, 08/12/2021   Zoster, Live 03/23/2011    Thyroid Problem Presents for follow-up visit. Symptoms include tremors. Patient reports no anxiety, cold intolerance, constipation, depressed mood, diarrhea, fatigue, heat intolerance, leg swelling, palpitations, weight gain or weight loss. The symptoms have been stable. Her past medical history is significant for hyperlipidemia.  Hyperlipidemia This is a chronic problem. The current episode started more than 1 year ago. The problem is uncontrolled. She is currently on no antihyperlipidemic treatment. Risk factors for coronary artery disease include dyslipidemia and hypertension.   Hypertension:  She is  on treatment.  Review of systems  Constitutional: + Minimally fluctuating body weight.,  current Body mass index is 24.16 kg/m. , no fatigue, no subjective hyperthermia, no subjective hypothermia    Objective:    BP 122/70   Pulse 68   Ht 5\' 5"  (1.651 m)   Wt 145 lb 3.2 oz (65.9 kg)   BMI 24.16 kg/m   Wt Readings from Last 3 Encounters:  04/04/23 145 lb 3.2 oz (65.9 kg)  11/02/22 146 lb (66.2 kg)  11/02/22 146 lb (66.2 kg)    BP Readings from Last 3 Encounters:  04/04/23 122/70  03/28/23 (!) 150/84  03/19/23 (!) 166/83    Physical Exam- Limited  Constitutional:  Body mass index is 24.16 kg/m. , not in acute distress, anxious state of mind Eyes:  EOMI, no exophthalmos Neck: Supple, palpable thyroid.  CMP     Component Value  Date/Time   NA 131 (L) 10/30/2022 0535   NA 132 (L) 04/27/2022 1043   K 3.9 10/30/2022 0535   CL 102 10/30/2022 0535   CO2 21 (L) 10/30/2022 0535   GLUCOSE 99 10/30/2022 0535   BUN 5 (L) 10/30/2022 0535   BUN 10 04/27/2022 1043   CREATININE 0.61 10/30/2022 0535   CREATININE 0.80 05/11/2020 0000   CALCIUM 8.4 (L) 10/30/2022 0535   PROT 6.4 11/18/2022 1303   ALBUMIN 4.3 11/18/2022 1303   AST 21 11/18/2022 1303   ALT 17 11/18/2022 1303   ALKPHOS 111 11/18/2022 1303   BILITOT 0.5 11/18/2022 1303   GFRNONAA >60 10/30/2022 0535   GFRNONAA 75 05/11/2020 0000   GFRAA 87 05/11/2020 0000     Diabetic Labs (most recent): Lab Results  Component Value Date   HGBA1C 5.6 (A) 09/29/2022   HGBA1C 5.5 (A) 06/25/2021  HGBA1C 5.7 (H) 05/11/2020      Lipid Panel     Component Value Date/Time   CHOL 204 (H) 03/28/2023 1021   TRIG 41 03/28/2023 1021   HDL 80 03/28/2023 1021   CHOLHDL 2.6 03/28/2023 1021   CHOLHDL 2.5 05/11/2020 0000   VLDL 10 04/03/2019 0934   LDLCALC 116 (H) 03/28/2023 1021   LDLCALC 110 (H) 05/11/2020 0000   Recent Results (from the past 2160 hour(s))  POCT urinalysis dipstick     Status: Abnormal   Collection Time: 03/19/23 11:21 AM  Result Value Ref Range   Color, UA yellow yellow   Clarity, UA cloudy (A) clear   Glucose, UA negative negative mg/dL   Bilirubin, UA negative negative   Ketones, POC UA negative negative mg/dL   Spec Grav, UA <=9.629 (A) 1.010 - 1.025   Blood, UA small (A) negative   pH, UA 6.5 5.0 - 8.0   Protein Ur, POC negative negative mg/dL   Urobilinogen, UA 0.2 0.2 or 1.0 E.U./dL   Nitrite, UA Negative Negative   Leukocytes, UA Large (3+) (A) Negative  Urine Culture     Status: Abnormal   Collection Time: 03/19/23 11:25 AM   Specimen: Urine, Clean Catch  Result Value Ref Range   Specimen Description      URINE, CLEAN CATCH Performed at [facility], [address], [place], [place] [address]    Special Requests       NONE Performed at [facility], [address], [place], [place] [address]    Culture 90,000 COLONIES/mL ESCHERICHIA COLI (A)    Report Status 03/21/2023 FINAL    Organism ID, Bacteria ESCHERICHIA COLI (A)       Susceptibility   Escherichia coli - MIC*    AMPICILLIN <=2 SENSITIVE Sensitive     CEFAZOLIN <=4 SENSITIVE Sensitive     CEFEPIME <=0.12 SENSITIVE Sensitive     CEFTRIAXONE <=0.25 SENSITIVE Sensitive     CIPROFLOXACIN <=0.25 SENSITIVE Sensitive     GENTAMICIN <=1 SENSITIVE Sensitive     IMIPENEM <=0.25 SENSITIVE Sensitive     NITROFURANTOIN <=16 SENSITIVE Sensitive     TRIMETH/SULFA <=20 SENSITIVE Sensitive     AMPICILLIN/SULBACTAM <=2 SENSITIVE Sensitive     PIP/TAZO <=4 SENSITIVE Sensitive     * 90,000 COLONIES/mL ESCHERICHIA COLI  POCT urinalysis dipstick     Status: Abnormal   Collection Time: 03/28/23  9:50 AM  Result Value Ref Range   Color, UA yellow yellow   Clarity, UA clear clear   Glucose, UA negative negative mg/dL   Bilirubin, UA negative negative   Ketones, POC UA negative negative mg/dL   Spec Grav, UA 1.027 2.536 - 1.025   Blood, UA moderate (A) negative   pH, UA 7.5 5.0 - 8.0   Protein Ur, POC negative negative mg/dL   Urobilinogen, UA 0.2 0.2 or 1.0 E.U./dL   Nitrite, UA Negative Negative   Leukocytes, UA Large (3+) (A) Negative  Urine Culture     Status: Abnormal   Collection Time: 03/28/23  9:58 AM   Specimen: Urine, Clean Catch  Result Value Ref Range   Specimen Description      URINE, CLEAN CATCH Performed at [facility], [address] [place], [place] [address]    Special Requests      NONE Performed at [facility], [address], [place], [place] [address]    Culture >=100,000 COLONIES/mL ESCHERICHIA COLI (A)    Report Status 03/30/2023 FINAL  Organism ID, Bacteria ESCHERICHIA COLI (A)       Susceptibility   Escherichia coli - MIC*    AMPICILLIN <=2 SENSITIVE Sensitive     CEFAZOLIN <=4 SENSITIVE Sensitive     CEFEPIME <=0.12  SENSITIVE Sensitive     CEFTRIAXONE <=0.25 SENSITIVE Sensitive     CIPROFLOXACIN <=0.25 SENSITIVE Sensitive     GENTAMICIN <=1 SENSITIVE Sensitive     IMIPENEM <=0.25 SENSITIVE Sensitive     NITROFURANTOIN <=16 SENSITIVE Sensitive     TRIMETH/SULFA <=20 SENSITIVE Sensitive     AMPICILLIN/SULBACTAM <=2 SENSITIVE Sensitive     PIP/TAZO <=4 SENSITIVE Sensitive     * >=100,000 COLONIES/mL ESCHERICHIA COLI  Lipid panel     Status: Abnormal   Collection Time: 03/28/23 10:21 AM  Result Value Ref Range   Cholesterol, Total 204 (H) 100 - 199 mg/dL   Triglycerides 41 0 - 149 mg/dL   HDL 80 >16 mg/dL   VLDL Cholesterol Cal 8 5 - 40 mg/dL   LDL Chol Calc (NIH) 109 (H) 0 - 99 mg/dL   Chol/HDL Ratio 2.6 0.0 - 4.4 ratio    Comment:                                   T. Chol/HDL Ratio                                             Men  Women                               1/2 Avg.Risk  3.4    3.3                                   Avg.Risk  5.0    4.4                                2X Avg.Risk  9.6    7.1                                3X Avg.Risk 23.4   11.0   TSH     Status: Abnormal   Collection Time: 03/28/23 10:21 AM  Result Value Ref Range   TSH 5.330 (H) 0.450 - 4.500 uIU/mL  T4, free     Status: None   Collection Time: 03/28/23 10:21 AM  Result Value Ref Range   Free T4 1.66 0.82 - 1.77 ng/dL     Assessment & Plan:   1.  Primary hypothyroidism -Her previsit thyroid function tests are discordant showing target free T4, however slightly above target TSH.  She wishes to stay on her current dose of Tirosint and that is acceptable.  She is advised to continue Tirosint 50 mcg p.o. daily before breakfast.     - We discussed about the correct intake of her thyroid hormone, on empty stomach at fasting, with water, separated by at least 30 minutes from breakfast and other medications,  and separated by more than 4 hours from calcium, iron, multivitamins, acid reflux medications (  PPIs). -Patient is  made aware of the fact that thyroid hormone replacement is needed for life, dose to be adjusted by periodic monitoring of thyroid function tests.  -She did not tolerate levothyroxine or Synthroid in the past.    2. Nontoxic multinodular goiter Her last ultrasound from 2017 was unchanged from previous studies, showing no significant findings.  More recent imaging showed similar findings in 2022.  No further thyroid imaging is necessary.    3.  Hypertension -She is responding to her blood pressure management with losartan 25 mg p.o. daily at breakfast.  She is advised to continue.    4) Hyperlipidemia: she did not engage with lifestyle medicine optimally.  She is open for intervention with statins.  I discussed initiated Crestor 5 mg p.o. nightly.  Side effects and precautions discussed with her.      5) Prediabetes- resolved with ac of 5.6%.  She will not need medication intervention for this at this time.  Lifestyle medicine nutrition described above will help her avoid progression to type 2 diabetes.   - I advised patient to maintain close follow up with Kerri Perches, MD for primary care needs.   I spent  22  minutes in the care of the patient today including review of labs from Thyroid Function, CMP, and other relevant labs ; imaging/biopsy records (current and previous including abstractions from other facilities); face-to-face time discussing  her lab results and symptoms, medications doses, her options of short and long term treatment based on the latest standards of care / guidelines;   and documenting the encounter.  [name]  participated in the discussions, expressed understanding, and voiced agreement with the above plans.  All questions were answered to her satisfaction. she is encouraged to contact clinic should she have any questions or concerns prior to her return visit.    Follow up plan: Return in about 6 months (around 10/02/2023) for Fasting Labs  in AM B4  8.  Ronny Bacon, [facility] [address] [place], [place] [address] Phone: [phone] Fax: [phone]  04/04/2023, 11:26 AM

## 2023-04-05 ENCOUNTER — Other Ambulatory Visit (HOSPITAL_COMMUNITY): Payer: Self-pay

## 2023-04-06 ENCOUNTER — Other Ambulatory Visit (HOSPITAL_COMMUNITY): Payer: Self-pay

## 2023-04-06 ENCOUNTER — Encounter: Payer: Self-pay | Admitting: "Endocrinology

## 2023-04-06 ENCOUNTER — Other Ambulatory Visit: Payer: Self-pay

## 2023-04-06 MED ORDER — LEVOTHYROXINE SODIUM 50 MCG PO CAPS
50.0000 ug | ORAL_CAPSULE | Freq: Every day | ORAL | 1 refills | Status: DC
  Filled 2023-04-06: qty 90, 90d supply, fill #0
  Filled 2023-07-14: qty 30, 30d supply, fill #1
  Filled 2023-08-09 (×2): qty 30, 30d supply, fill #2
  Filled 2023-09-10: qty 30, 30d supply, fill #3

## 2023-04-07 ENCOUNTER — Other Ambulatory Visit: Payer: Self-pay

## 2023-04-10 ENCOUNTER — Encounter: Payer: Self-pay | Admitting: Pharmacist

## 2023-04-10 ENCOUNTER — Other Ambulatory Visit: Payer: Self-pay

## 2023-04-11 ENCOUNTER — Encounter: Payer: Self-pay | Admitting: Family Medicine

## 2023-04-11 ENCOUNTER — Other Ambulatory Visit (HOSPITAL_COMMUNITY): Payer: Self-pay

## 2023-04-13 ENCOUNTER — Encounter: Payer: Self-pay | Admitting: Internal Medicine

## 2023-04-13 ENCOUNTER — Ambulatory Visit: Payer: Medicare Other | Admitting: Internal Medicine

## 2023-04-13 VITALS — BP 160/90 | HR 75 | Resp 16 | Ht 65.0 in | Wt 145.8 lb

## 2023-04-13 DIAGNOSIS — N3000 Acute cystitis without hematuria: Secondary | ICD-10-CM | POA: Diagnosis not present

## 2023-04-13 DIAGNOSIS — Z87440 Personal history of urinary (tract) infections: Secondary | ICD-10-CM | POA: Diagnosis not present

## 2023-04-13 NOTE — Progress Notes (Signed)
Acute Office Visit  Subjective:     Patient ID: Julie May, female    DOB: 10/14/1950, 72 y.o.   MRN: [id number]  Chief Complaint  Patient presents with   Urinary Tract Infection    Previously treated with bractrim. Still has urine odor and some dysuria    Julie May presents today for an acute visit with concern for persistent UTI symptoms.  She presented to urgent care on 9/17 endorsing symptoms of urgency, dysuria, foul odor, and discoloration of urine.  UA was consistent with UTI and urine culture grew pan susceptible E. coli.  She was treated with Bactrim.  Julie May states that when she contacted our office to make this appointment earlier this week she was experiencing persistent dysuria, foul odor, and discoloration of urine.  Urgency had resolved.  Today Julie May states that her symptoms have completely resolved.  She is asymptomatic.  UA obtained today is not consistent with UTI.  She does not have any additional concerns to discuss.  Review of Systems  Genitourinary:  Positive for dysuria (Now resolved) and urgency (Now resolved).       Recent foul odor and discoloration of urine      Objective:    BP (!) 160/90   Pulse 75   Resp 16   Ht 5\' 5"  (1.651 m)   Wt 145 lb 12.8 oz (66.1 kg)   SpO2 96%   BMI 24.26 kg/m   Physical Exam Constitutional:      General: She is not in acute distress.    Appearance: Normal appearance. She is not toxic-appearing.  HENT:     Head: Normocephalic and atraumatic.     Right Ear: External ear normal.     Left Ear: External ear normal.     Nose: Nose normal. No congestion or rhinorrhea.     Mouth/Throat:     Mouth: Mucous membranes are moist.     Pharynx: Oropharynx is clear. No oropharyngeal exudate or posterior oropharyngeal erythema.  Eyes:     General: No scleral icterus.    Extraocular Movements: Extraocular movements intact.     Conjunctiva/sclera: Conjunctivae normal.     Pupils: Pupils are equal, round, and reactive to light.   Cardiovascular:     Rate and Rhythm: Normal rate and regular rhythm.     Pulses: Normal pulses.     Heart sounds: Normal heart sounds. No murmur heard.    No friction rub. No gallop.  Pulmonary:     Effort: Pulmonary effort is normal.     Breath sounds: Normal breath sounds. No wheezing, rhonchi or rales.  Abdominal:     General: Abdomen is flat. Bowel sounds are normal. There is no distension.     Palpations: Abdomen is soft.     Tenderness: There is no abdominal tenderness.  Musculoskeletal:        General: No swelling. Normal range of motion.     Cervical back: Normal range of motion.     Right lower leg: No edema.     Left lower leg: No edema.  Lymphadenopathy:     Cervical: No cervical adenopathy.  Skin:    General: Skin is warm and dry.     Capillary Refill: Capillary refill takes less than 2 seconds.     Coloration: Skin is not jaundiced.  Neurological:     General: No focal deficit present.     Mental Status: She is alert and oriented to person, place, and time.  Psychiatric:        Mood and Affect: Mood normal.        Behavior: Behavior normal.    Results for orders placed or performed in visit on 04/13/23  Urinalysis  Result Value Ref Range   Specific Gravity, UA 1.010 1.005 - 1.030   pH, UA 6.5 5.0 - 7.5   Color, UA Yellow Yellow   Appearance Ur Clear Clear   Leukocytes,UA Negative Negative   Protein,UA Negative Negative/Trace   Glucose, UA Negative Negative   Ketones, UA Negative Negative   RBC, UA Trace Negative   Bilirubin, UA Negative Negative   Urobilinogen, Ur 0.2 0.2 - 1.0 mg/dL   Nitrite, UA Negative Negative      Assessment & Plan:   Problem List Items Addressed This Visit       History of UTI - Primary    Recent history of E. coli UTI treated with Bactrim.  She called earlier this week to schedule an appointment for today endorsing persistent UTI symptoms.  Symptoms have now resolved.  UA is not consistent with infection.   -No indication  for additional antibiotic treatment. -She will return to care for routine follow-up with Dr. Lodema Hong on 10/24.      Return if symptoms worsen or fail to improve.  Billie Lade, MD

## 2023-04-13 NOTE — Patient Instructions (Signed)
It was a pleasure to see you today.  Thank you for giving [place] the opportunity to be involved in your care.  Below is a brief recap of your visit and next steps.  We will plan to see you again on 10/24.  Summary No indication for additional antibiotics at this time Follow up with Dr. Lodema Hong as scheduled on 10/24

## 2023-04-17 LAB — URINALYSIS
Bilirubin, UA: NEGATIVE
Glucose, UA: NEGATIVE
Ketones, UA: NEGATIVE
Leukocytes,UA: NEGATIVE
Nitrite, UA: NEGATIVE
Protein,UA: NEGATIVE
Specific Gravity, UA: 1.01 (ref 1.005–1.030)
Urobilinogen, Ur: 0.2 mg/dL (ref 0.2–1.0)
pH, UA: 6.5 (ref 5.0–7.5)

## 2023-04-30 ENCOUNTER — Encounter: Payer: Self-pay | Admitting: Internal Medicine

## 2023-04-30 DIAGNOSIS — Z87440 Personal history of urinary (tract) infections: Secondary | ICD-10-CM | POA: Insufficient documentation

## 2023-04-30 NOTE — Assessment & Plan Note (Addendum)
Recent history of E. coli UTI treated with Bactrim.  She called earlier this week to schedule an appointment for today endorsing persistent UTI symptoms.  Symptoms have now resolved.  UA is not consistent with infection.   -No indication for additional antibiotic treatment. -She will return to care for routine follow-up with Dr. Lodema Hong on 10/24.

## 2023-05-04 ENCOUNTER — Encounter: Payer: Self-pay | Admitting: Family Medicine

## 2023-05-04 ENCOUNTER — Ambulatory Visit (INDEPENDENT_AMBULATORY_CARE_PROVIDER_SITE_OTHER): Payer: Medicare Other | Admitting: Family Medicine

## 2023-05-04 VITALS — BP 117/69 | HR 83 | Ht 65.0 in | Wt 145.1 lb

## 2023-05-04 DIAGNOSIS — I10 Essential (primary) hypertension: Secondary | ICD-10-CM | POA: Diagnosis not present

## 2023-05-04 DIAGNOSIS — E038 Other specified hypothyroidism: Secondary | ICD-10-CM | POA: Diagnosis not present

## 2023-05-04 DIAGNOSIS — M25571 Pain in right ankle and joints of right foot: Secondary | ICD-10-CM

## 2023-05-04 DIAGNOSIS — E782 Mixed hyperlipidemia: Secondary | ICD-10-CM | POA: Diagnosis not present

## 2023-05-04 DIAGNOSIS — M21372 Foot drop, left foot: Secondary | ICD-10-CM

## 2023-05-04 DIAGNOSIS — Z23 Encounter for immunization: Secondary | ICD-10-CM | POA: Diagnosis not present

## 2023-05-04 DIAGNOSIS — M4316 Spondylolisthesis, lumbar region: Secondary | ICD-10-CM | POA: Diagnosis not present

## 2023-05-04 DIAGNOSIS — R7303 Prediabetes: Secondary | ICD-10-CM | POA: Diagnosis not present

## 2023-05-04 DIAGNOSIS — M25572 Pain in left ankle and joints of left foot: Secondary | ICD-10-CM | POA: Diagnosis not present

## 2023-05-04 DIAGNOSIS — M79671 Pain in right foot: Secondary | ICD-10-CM

## 2023-05-04 DIAGNOSIS — G8929 Other chronic pain: Secondary | ICD-10-CM | POA: Diagnosis not present

## 2023-05-04 DIAGNOSIS — L84 Corns and callosities: Secondary | ICD-10-CM

## 2023-05-04 NOTE — Progress Notes (Signed)
Julie May     MRN: [id number]      DOB: 24-Sep-1950  Chief Complaint  Patient presents with   Follow-up    Follow up foot drop, discuss statins, calus on foot, b12 checked dark spot under eye    HPI Julie May is here for follow up and re-evaluation of chronic medical conditions, medication management and review of any available recent lab and radiology data.  Preventive health is updated, specifically  Cancer screening and Immunization.  Right foot callus, right ankle deformity with difficulty walking    Left foot drop, pronounced x 3 months, of note, pt reports having left foot drop prior this had presented with acute foot drop  and ortho determined spine surgery was indicated and this was done , no foot drop since past 3 months C/o dark area under left eye intermittent ROS Denies recent fever or chills. Denies sinus pressure, nasal congestion, ear pain or sore throat. Denies chest congestion, productive cough or wheezing. Denies chest pains, palpitations and leg swelling Denies abdominal pain, nausea, vomiting,diarrhea or constipation.   Denies dysuria, frequency, hesitancy or incontinence. . Denies depression, anxiety or insomnia. Denies skin break down or rash.   PE  BP 117/69 (BP Location: Right Arm, Patient Position: Sitting, Cuff Size: Large)   Pulse 83   Ht 5\' 5"  (1.651 m)   Wt 145 lb 1.9 oz (65.8 kg)   SpO2 95%   BMI 24.15 kg/m   Patient alert and oriented and in no cardiopulmonary distress.  HEENT: No facial asymmetry, EOMI,     Neck supple .  Chest: Clear to auscultation bilaterally.  CVS: S1, S2 no murmurs, no S3.Regular rate.  ABD: Soft non tender.   Ext: No edema  MS: decreased ROM ankles right greater than left , severe deformity of right ankle Skin: Intact, hyperpigmented non blanching area under left eye, callus right foot.  Psych: Good eye contact, normal affect. Memory intact not anxious or depressed appearing.  CNS: CN 2-12  intact, Assessment & Plan  Essential hypertension, benign Controlled, no change in medication DASH diet and commitment to daily physical activity for a minimum of 30 minutes discussed and encouraged, as a part of hypertension management. The importance of attaining a healthy weight is also discussed.     05/04/2023   10:58 AM 04/13/2023    4:15 PM 04/04/2023   10:17 AM 03/28/2023    9:39 AM 03/19/2023   11:11 AM 11/02/2022    1:34 PM 11/02/2022   11:54 AM  BP/Weight  Systolic BP 117 160 122 150 166 150 134  Diastolic BP 69 90 70 84 83 85 80  Wt. (Lbs) 145.12 145.8 145.2   146   BMI 24.15 kg/m2 24.26 kg/m2 24.16 kg/m2   24.3 kg/m2        Hypothyroidism Managed by Endo  Callus of foot Painful, already referred to Ortho, but willl need podiatry to mange this refer to [facility]  Right foot pain Increased with worsening difficulty in ambulation Ortho to eval and manage, states orthotics which have been recommended in the past are no longer beneficial  Spondylolisthesis of lumbar region C/o left foot drop recurrent , significant arthritis and disc dis noted at L4 /5 with synovial cyst indenting the left thecal sac, recommend rept imaging adf referral to neurosurgery will message pt about this before proceeding as discussed at visit, she had requested nerve conduction testing  Mixed hyperlipidemia Hyperlipidemia:Low fat diet discussed and encouraged.   Lipid Panel  Lab Results  Component Value Date   CHOL 204 (H) 03/28/2023   HDL 80 03/28/2023   LDLCALC 116 (H) 03/28/2023   TRIG 41 03/28/2023   CHOLHDL 2.6 03/28/2023     Positive f/h of CAD,m twice weekly low dose crestor recommended if able to tolerate  Left foot drop 3 month history reported in 04/2023, has occurred in the past and was corrected by spine surgery in 2021 Will reach out to [facility] re referral for nerve testing

## 2023-05-04 NOTE — Patient Instructions (Addendum)
F/U in 6 months,call if you needme sooner  Flu vaccine today  Nurse pls track and document pneumonia vaccine  You are referred to dr Lajoyce Corners re right foot and ankle pain Nurse pls call to see if Dr Lajoyce Corners will also address callus on foot, if not needs to be referred to Podiatry  For new left foot drop will determine best order of evaluation and message you,  Recommend twice weekly crestor, message  if cannot take will try alternative  Wellness to be scheduled at checkout  Dermatology to address lesion under left eye   Thanks for choosing [facility], we consider it a privelige to serve you.

## 2023-05-07 DIAGNOSIS — Z23 Encounter for immunization: Secondary | ICD-10-CM | POA: Insufficient documentation

## 2023-05-07 DIAGNOSIS — R7989 Other specified abnormal findings of blood chemistry: Secondary | ICD-10-CM | POA: Insufficient documentation

## 2023-05-07 DIAGNOSIS — M21372 Foot drop, left foot: Secondary | ICD-10-CM | POA: Insufficient documentation

## 2023-05-07 NOTE — Assessment & Plan Note (Signed)
Updated lab needed at/ before next visit.   

## 2023-05-07 NOTE — Assessment & Plan Note (Signed)
Hyperlipidemia:Low fat diet discussed and encouraged.   Lipid Panel  Lab Results  Component Value Date   CHOL 204 (H) 03/28/2023   HDL 80 03/28/2023   LDLCALC 116 (H) 03/28/2023   TRIG 41 03/28/2023   CHOLHDL 2.6 03/28/2023     Positive f/h of CAD,m twice weekly low dose crestor recommended if able to tolerate

## 2023-05-07 NOTE — Assessment & Plan Note (Signed)
Controlled, no change in medication DASH diet and commitment to daily physical activity for a minimum of 30 minutes discussed and encouraged, as a part of hypertension management. The importance of attaining a healthy weight is also discussed.     05/04/2023   10:58 AM 04/13/2023    4:15 PM 04/04/2023   10:17 AM 03/28/2023    9:39 AM 03/19/2023   11:11 AM 11/02/2022    1:34 PM 11/02/2022   11:54 AM  BP/Weight  Systolic BP 117 160 122 150 166 150 134  Diastolic BP 69 90 70 84 83 85 80  Wt. (Lbs) 145.12 145.8 145.2   146   BMI 24.15 kg/m2 24.26 kg/m2 24.16 kg/m2   24.3 kg/m2

## 2023-05-07 NOTE — Assessment & Plan Note (Addendum)
Patient educated about the importance of limiting  Carbohydrate intake , the need to commit to daily physical activity for a minimum of 30 minutes , and to commit weight loss. The fact that changes in all these areas will reduce or eliminate all together the development of diabetes is stressed.      Latest Ref Rng & Units 03/28/2023   10:21 AM 10/30/2022    5:35 AM 10/29/2022   10:32 AM 09/29/2022   10:33 AM 09/23/2022    9:55 AM  Diabetic Labs  HbA1c 5.7 - 6.4 %    5.6    Chol 100 - 199 mg/dL 563     875   HDL >64 mg/dL 80     75   Calc LDL 0 - 99 mg/dL 332     951   Triglycerides 0 - 149 mg/dL 41     51   Creatinine 0.44 - 1.00 mg/dL  8.84  1.66         01/08/1600   10:58 AM 04/13/2023    4:15 PM 04/04/2023   10:17 AM 03/28/2023    9:39 AM 03/19/2023   11:11 AM 11/02/2022    1:34 PM 11/02/2022   11:54 AM  BP/Weight  Systolic BP 117 160 122 150 166 150 134  Diastolic BP 69 90 70 84 83 85 80  Wt. (Lbs) 145.12 145.8 145.2   146   BMI 24.15 kg/m2 24.26 kg/m2 24.16 kg/m2   24.3 kg/m2        No data to display

## 2023-05-07 NOTE — Assessment & Plan Note (Signed)
Painful, already referred to Ortho, but willl need podiatry to mange this refer to [facility]

## 2023-05-07 NOTE — Assessment & Plan Note (Addendum)
3 month history reported in 04/2023, has occurred in the past and was corrected by spine surgery in 2021 Will reach out to [facility] re referral for nerve testing

## 2023-05-07 NOTE — Assessment & Plan Note (Signed)
C/o left foot drop recurrent , significant arthritis and disc dis noted at L4 /5 with synovial cyst indenting the left thecal sac, recommend rept imaging adf referral to neurosurgery will message pt about this before proceeding as discussed at visit, she had requested nerve conduction testing

## 2023-05-07 NOTE — Assessment & Plan Note (Signed)
Increased with worsening difficulty in ambulation Ortho to eval and manage, states orthotics which have been recommended in the past are no longer beneficial

## 2023-05-07 NOTE — Assessment & Plan Note (Signed)
Managed by Endo 

## 2023-05-18 ENCOUNTER — Ambulatory Visit: Payer: Medicare Other | Admitting: Podiatry

## 2023-05-22 ENCOUNTER — Ambulatory Visit (INDEPENDENT_AMBULATORY_CARE_PROVIDER_SITE_OTHER): Payer: Medicare Other | Admitting: Orthopedic Surgery

## 2023-05-22 DIAGNOSIS — L97521 Non-pressure chronic ulcer of other part of left foot limited to breakdown of skin: Secondary | ICD-10-CM

## 2023-05-22 DIAGNOSIS — G600 Hereditary motor and sensory neuropathy: Secondary | ICD-10-CM | POA: Diagnosis not present

## 2023-05-22 DIAGNOSIS — L97511 Non-pressure chronic ulcer of other part of right foot limited to breakdown of skin: Secondary | ICD-10-CM | POA: Diagnosis not present

## 2023-05-23 ENCOUNTER — Encounter: Payer: Self-pay | Admitting: Orthopedic Surgery

## 2023-05-23 ENCOUNTER — Encounter: Payer: Self-pay | Admitting: Family Medicine

## 2023-05-23 NOTE — Addendum Note (Signed)
Addended by: Kerri Perches on: 05/23/2023 12:38 PM   Modules accepted: Orders

## 2023-05-23 NOTE — Progress Notes (Signed)
Office Visit Note   Patient: Julie May           Date of Birth: 06-Mar-1951           MRN: [id number] Visit Date: 05/22/2023              Requested by: Kerri Perches, MD [address] [place],  [place] [address] PCP: Kerri Perches, MD  Chief Complaint  Patient presents with   Right Foot - Pain   Left Foot - Pain      HPI: Patient is a 72 year old woman who is seen for initial evaluation for weakness and foot drop both feet as well as calluses on both feet.  Patient states she walks on the outside of her feet has ankle instability and develops ulcers on the lateral side of her feet.  She states she cannot stand without shoes on.  Assessment & Plan: Visit Diagnoses:  1. Non-pressure chronic ulcer of other part of left foot limited to breakdown of skin (HCC)   2. Non-pressure chronic ulcer of other part of right foot limited to breakdown of skin (HCC)     Plan: Recommended stiff soled shoes with sole orthotics.  Discussed that she would most likely benefit from double upright braces with her shoes.  Will reevaluate in 3 months.    Follow-Up Instructions: Return in about 3 months (around 08/22/2023).   Ortho Exam  Patient is alert, oriented, no adenopathy, well-dressed, normal affect, normal respiratory effort.  Examination patient has a good dorsalis pedis pulse bilaterally.  Patient has cavovarus deformity of both feet, claw toes with absent peroneal brevis motor function.  She has atrophy of the lateral compartment with no active eversion.  She has over pulling of the posterior tibial tendon and anterior tibial tendon.  There are no open ulcers on either foot.    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 No images are attached to the encounter.  Labs: Lab Results  Component Value Date   HGBA1C 5.6 (A) 09/29/2022   HGBA1C 5.5 (A) 06/25/2021   HGBA1C 5.7 (H) 05/11/2020   REPTSTATUS 03/30/2023  FINAL 03/28/2023   CULT >=100,000 COLONIES/mL ESCHERICHIA COLI (A) 03/28/2023   LABORGA ESCHERICHIA COLI (A) 03/28/2023     Lab Results  Component Value Date   ALBUMIN 4.3 11/18/2022   ALBUMIN 3.0 (L) 10/30/2022   ALBUMIN 3.5 10/29/2022    No results found for: "MG" Lab Results  Component Value Date   VD25OH 44.4 04/27/2022   VD25OH 45.9 02/11/2021   VD25OH 42 05/11/2020    No results found for: "PREALBUMIN"    Latest Ref Rng & Units 10/30/2022    5:35 AM 10/29/2022   10:32 AM 04/27/2022   10:43 AM  CBC EXTENDED  WBC 4.0 - 10.5 K/uL 6.6  7.7  4.7   RBC 3.87 - 5.11 MIL/uL 4.16  4.54  5.11   Hemoglobin 12.0 - 15.0 g/dL 65.7  84.6  96.2   HCT 36.0 - 46.0 % 35.1  38.4  42.8   Platelets 150 - 400 K/uL 209  228  283      There is no height or weight on file to calculate BMI.  Orders:  No orders of the defined types were placed in this encounter.  No orders of the defined types were placed in this encounter.    Procedures: No procedures performed  Clinical Data: No additional findings.  ROS:  All other systems negative, except as noted in the HPI. Review of Systems  Objective: Vital Signs: There were no vitals taken for this visit.  Specialty Comments:  No specialty comments available.  PMFS History: Patient Active Problem List   Diagnosis Date Noted   Left foot drop 05/07/2023   Encounter for immunization 05/07/2023   Low vitamin D level 05/07/2023   History of UTI 04/30/2023   Elevated LFTs 11/02/2022   Cat bite of forearm 10/29/2022   Spondylolisthesis of lumbar region 01/28/2020   Callus of foot 11/07/2017   Prediabetes 08/15/2017   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   Osteopenia 04/04/2015   Right foot pain 05/23/2013   Essential hypertension, benign 11/12/2010   Hypothyroidism 01/18/2010   Mixed hyperlipidemia 12/17/2009   Past Medical History:  Diagnosis Date   Bilateral impacted cerumen 04/10/2019   Callus of foot 11/07/2017   Foot  pain, bilateral 05/23/2013   Hyperlipidemia    Hyperpotassemia    Hypertension    new dx 11/12/2010   Hypothyroidism    OA (osteoarthritis)    Other abnormal glucose    Overweight(278.02)    Polycythemia, secondary    Routine general medical examination at a health care facility    THYROID NODULE 01/18/2010   Qualifier: Diagnosis of  By: Everardo All MD, Sean A    Trigger finger (acquired)    Unspecified disorder of thyroid    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    Family History  Problem Relation Age of Onset   Heart attack Father    Stroke Mother    Hypertension Mother    Thyroid disease Sister    Hyperlipidemia Brother    Heart attack Brother    Heart disease Brother    Arthritis Other        family history    Hypertension Other        family history    Stroke Other        family history    Heart disease Other        family history     Past Surgical History:  Procedure Laterality Date   COLONOSCOPY N/A 03/30/2016   Procedure: COLONOSCOPY;  Surgeon: Malissa Hippo, MD;  Location: [facility];  Service: Endoscopy;  Laterality: N/A;  1030   SPINE SURGERY Bilateral 01/2020   Dr. Elie Confer Spine   TONSILECTOMY, ADENOIDECTOMY, BILATERAL MYRINGOTOMY AND TUBES     TONSILLECTOMY     uterine polyp removal      Social History   Occupational History   Occupation: Conservator, museum/gallery   Tobacco Use   Smoking status: Never   Smokeless tobacco: Never  Vaping Use   Vaping status: Never Used  Substance and Sexual Activity   Alcohol use: Yes    Comment: occ.   Drug use: No   Sexual activity: Yes    Birth control/protection: Post-menopausal

## 2023-05-24 ENCOUNTER — Encounter: Payer: Self-pay | Admitting: Neurology

## 2023-05-25 DIAGNOSIS — D485 Neoplasm of uncertain behavior of skin: Secondary | ICD-10-CM | POA: Diagnosis not present

## 2023-05-25 DIAGNOSIS — D225 Melanocytic nevi of trunk: Secondary | ICD-10-CM | POA: Diagnosis not present

## 2023-05-25 DIAGNOSIS — Z1283 Encounter for screening for malignant neoplasm of skin: Secondary | ICD-10-CM | POA: Diagnosis not present

## 2023-05-29 ENCOUNTER — Encounter: Payer: Self-pay | Admitting: Neurology

## 2023-05-29 ENCOUNTER — Ambulatory Visit: Payer: Medicare Other | Admitting: Neurology

## 2023-05-29 VITALS — BP 143/84 | HR 79 | Ht 65.0 in | Wt 149.0 lb

## 2023-05-29 DIAGNOSIS — Q6672 Congenital pes cavus, left foot: Secondary | ICD-10-CM | POA: Diagnosis not present

## 2023-05-29 DIAGNOSIS — Q6671 Congenital pes cavus, right foot: Secondary | ICD-10-CM | POA: Diagnosis not present

## 2023-05-29 DIAGNOSIS — Z9889 Other specified postprocedural states: Secondary | ICD-10-CM

## 2023-05-29 DIAGNOSIS — M21371 Foot drop, right foot: Secondary | ICD-10-CM

## 2023-05-29 NOTE — Progress Notes (Signed)
[facility] Clinic Note - Initial Visit   Date: 05/29/2023   Julie May MRN: [id number] DOB: 29-Oct-1950   Dear Dr. Lodema Hong:  Thank you for your kind referral of Julie May for consultation of foot weakness. Although her history is well known to you, please allow [place] to reiterate it for the purpose of our medical record. The patient was accompanied to the clinic by husband who also provides collateral information.     Julie May is a 72 y.o. right-handed female with hypertension presenting for evaluation of bilateral foot weakness.   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  I will check NCS/EMG of the legs to further evaluate.  Patient educated on daily foot inspection, fall prevention, and safety precautions around the home.  She would benefit from AFO, but would like to complete testing prior to seeing orthotic specialist.   She reports having history of left foot drop due to severe spinal canal stenosis at L4-5.  MRI lumbar spine will be ordered to evaluate for adjacent disease.  Return to clinic in 3 months  ------------------------------------------------------------- History of present illness: She had lumbar decompression in 2021 after presenting with left foot drop with MRI showing severe spinal canal stenosis at L4-5.  Her foot drop resolved with surgery.  For the past year, she began noticing that her legs started getting weak and she feels unsteady, walks unassisted.   She has fallen twice. She has weakness in the feet, worse on the left.  She denies numbness/tingling of the feet or hand weakness.   Mother had feet that look similar with high arches and hammer toes.  She reports rolling her ankles frequently even as a child.   She is retired Catering manager.  Out-side paper records, electronic medical record, and images have been reviewed where available and summarized as:  MRI  lumbar spine wo contrast 01/03/2020: IMPRESSION: Mild spinal stenosis and mild subarticular stenosis bilaterally L3-4 Severe spinal stenosis L4-5 with severe facet degeneration and grade 1 anterolisthesis. 5 mm synovial cyst on the left projecting into the spinal canal below the disc space.    Lab Results  Component Value Date   HGBA1C 5.6 (A) 09/29/2022   No results found for: "VITAMINB12" Lab Results  Component Value Date   TSH 5.330 (H) 03/28/2023   No results found for: "ESRSEDRATE", "POCTSEDRATE"  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    Past Surgical History:  Procedure Laterality Date   COLONOSCOPY N/A 03/30/2016   Procedure: COLONOSCOPY;  Surgeon: Malissa Hippo, MD;  Location: [facility];  Service: Endoscopy;  Laterality: N/A;  1030   SPINE SURGERY Bilateral 01/2020   Dr. Elie Confer Spine   TONSILECTOMY, ADENOIDECTOMY, BILATERAL MYRINGOTOMY AND TUBES     TONSILLECTOMY     uterine polyp removal  Medications:  Outpatient Encounter Medications as of 05/29/2023  Medication Sig   Cholecalciferol (VITAMIN D) 2000 units CAPS Take 2,000 Units by mouth daily with lunch.    losartan (COZAAR) 25 MG tablet TAKE 1 TABLET(25 MG) BY MOUTH DAILY   TIROSINT 50 MCG CAPS Take 1 capsule (50 mcg total) by mouth daily before breakfast.   No facility-administered encounter medications on file as of 05/29/2023.    Allergies: No Known  Allergies  Family History: Family History  Problem Relation Age of Onset   Heart attack Father    Stroke Mother    Hypertension Mother    Thyroid disease Sister    Hyperlipidemia Brother    Heart attack Brother    Heart disease Brother    Arthritis Other        family history    Hypertension Other        family history    Stroke Other        family history    Heart disease Other        family history     Social History: Social History   Tobacco Use   Smoking status: Never   Smokeless tobacco: Never  Vaping Use   Vaping status: Never Used  Substance Use Topics   Alcohol use: Yes    Comment: occasional beer   Drug use: No   Social History   Social History Narrative   Married x 36 years 2022.         Are you right handed or left handed? Right Handed    Are you currently employed ? No   What is your current occupation? Retired    Do you live at home alone? No    Who lives with you? With husband    What type of home do you live in: 1 story or 2 story? Lives in a one story home with a basement         Vital Signs:  BP (!) 143/84   Pulse 79   Ht 5\' 5"  (1.651 m)   Wt 149 lb (67.6 kg)   SpO2 99%   BMI 24.79 kg/m    Neurological Exam: MENTAL STATUS including orientation to time, place, person, recent and remote memory, attention span and concentration, language, and fund of knowledge is normal.  Speech is not dysarthric.  CRANIAL NERVES: II:  No visual field defects.     III-IV-VI: Pupils equal round and reactive to light.  Normal conjugate, extra-ocular eye movements in all directions of gaze.  No nystagmus.  No ptosis.   V:  Normal facial sensation.    VII:  Normal facial symmetry and movements.   VIII:  Normal hearing and vestibular function.   IX-X:  Normal palatal movement.   XI:  Normal shoulder shrug and head rotation.   XII:  Normal tongue strength and range of motion, no deviation or fasciculation.  MOTOR:  Prominent bilateral foot deformity with  high arches and hammer toes.  She atrophy of the lower legs.  She also has Charcot joints involving the ankles. No abnormal movements.  No pronator drift.  Upper Extremity:  Right  Left  Deltoid  5/5   5/5   Biceps  5/5   5/5   Triceps  5/5   5/5   Infraspinatus 5/5  5/5  Medial pectoralis 5/5  5/5  Wrist extensors  5/5   5/5   Wrist flexors  5/5   5/5   Finger extensors  5/5   5/5   Finger flexors  5/5   5/5   Dorsal interossei  4/5   4/5   Abductor pollicis  5/5   5/5   Tone (Ashworth scale)  0  0   Lower Extremity:  Right  Left  Hip flexors  5/5   5/5   Knee flexors  5/5   5/5   Knee extensors  5/5   5/5   Dorsiflexors  3/5   3/5   Plantarflexors  3/5   3/5   Toe extensors  3/5   2/5   Toe flexors  3/5   2/5   Tone (Ashworth scale)  0  0   MSRs:                                           Right        Left brachioradialis 2+  2+  biceps 2+  2+  triceps 2+  2+  patellar 0  0  ankle jerk 0  0  Hoffman no  no  plantar response down  down   SENSORY:  Reduced vibration at the right great toe, absent on the left great toe.  Temperature and pin prick intact.   COORDINATION/GAIT: Normal finger-to- nose-finger.  Intact rapid alternating movements bilaterally.  Able to rise from a chair without using arms. Gait appears mildly wide-based, with bilateral feet inverted and mild steppage, slightly unsteady    Thank you for allowing me to participate in patient's care.  If I can answer any additional questions, I would be pleased to do so.    Sincerely,    Yekaterina Escutia K. Allena Katz, DO

## 2023-05-29 NOTE — Patient Instructions (Signed)
Nerve testing of both legs  MRI lumbar spine  ELECTROMYOGRAM AND NERVE CONDUCTION STUDIES (EMG/NCS) INSTRUCTIONS  How to Prepare The neurologist conducting the EMG will need to know if you have certain medical conditions. Tell the neurologist and other EMG lab personnel if you: Have a pacemaker or any other electrical medical device Take blood-thinning medications Have hemophilia, a blood-clotting disorder that causes prolonged bleeding Bathing Take a shower or bath shortly before your exam in order to remove oils from your skin. Don't apply lotions or creams before the exam.  What to Expect You'll likely be asked to change into a hospital gown for the procedure and lie down on an examination table. The following explanations can help you understand what will happen during the exam.  Electrodes. The neurologist or a technician places surface electrodes at various locations on your skin depending on where you're experiencing symptoms. Or the neurologist may insert needle electrodes at different sites depending on your symptoms.  Sensations. The electrodes will at times transmit a tiny electrical current that you may feel as a twinge or spasm. The needle electrode may cause discomfort or pain that usually ends shortly after the needle is removed. If you are concerned about discomfort or pain, you may want to talk to the neurologist about taking a short break during the exam.  Instructions. During the needle EMG, the neurologist will assess whether there is any spontaneous electrical activity when the muscle is at rest - activity that isn't present in healthy muscle tissue - and the degree of activity when you slightly contract the muscle.  He or she will give you instructions on resting and contracting a muscle at appropriate times. Depending on what muscles and nerves the neurologist is examining, he or she may ask you to change positions during the exam.  After your EMG You may experience some  temporary, minor bruising where the needle electrode was inserted into your muscle. This bruising should fade within several days. If it persists, contact your primary care doctor.

## 2023-05-30 DIAGNOSIS — Z23 Encounter for immunization: Secondary | ICD-10-CM | POA: Diagnosis not present

## 2023-05-31 ENCOUNTER — Telehealth: Payer: Self-pay

## 2023-05-31 NOTE — Telephone Encounter (Signed)
No prior Authorization is required for Lumbar Spine. Medicare Part A and B is primary and [employer] is secondary. Per Insurance: Medicare is always primary. File claims directly with Medicare. Medicare will submit balance to [employer].  Called patient and left a detailed message per DPR that her MRI Lumbar is ready to be scheduled and she may call Central Scheduling to get scheduled at [phone]. Left our contact information incase patient had any questions or concerns.

## 2023-06-11 ENCOUNTER — Other Ambulatory Visit: Payer: Self-pay | Admitting: "Endocrinology

## 2023-06-14 ENCOUNTER — Ambulatory Visit (HOSPITAL_COMMUNITY)
Admission: RE | Admit: 2023-06-14 | Discharge: 2023-06-14 | Disposition: A | Discharge status: Home / Self Care | Payer: Medicare Other | Source: Ambulatory Visit | Attending: Neurology | Admitting: Neurology

## 2023-06-14 DIAGNOSIS — M5137 Other intervertebral disc degeneration, lumbosacral region with discogenic back pain only: Secondary | ICD-10-CM | POA: Diagnosis not present

## 2023-06-14 DIAGNOSIS — M21371 Foot drop, right foot: Secondary | ICD-10-CM | POA: Diagnosis not present

## 2023-06-14 DIAGNOSIS — M48061 Spinal stenosis, lumbar region without neurogenic claudication: Secondary | ICD-10-CM | POA: Diagnosis not present

## 2023-06-14 DIAGNOSIS — M21372 Foot drop, left foot: Secondary | ICD-10-CM | POA: Insufficient documentation

## 2023-06-14 DIAGNOSIS — Z9889 Other specified postprocedural states: Secondary | ICD-10-CM | POA: Diagnosis not present

## 2023-06-14 DIAGNOSIS — M5136 Other intervertebral disc degeneration, lumbar region with discogenic back pain only: Secondary | ICD-10-CM | POA: Diagnosis not present

## 2023-06-14 DIAGNOSIS — S3210XA Unspecified fracture of sacrum, initial encounter for closed fracture: Secondary | ICD-10-CM | POA: Diagnosis not present

## 2023-06-15 ENCOUNTER — Ambulatory Visit: Payer: Medicare Other | Admitting: Podiatry

## 2023-06-18 ENCOUNTER — Other Ambulatory Visit: Payer: Self-pay | Admitting: "Endocrinology

## 2023-06-19 ENCOUNTER — Other Ambulatory Visit: Payer: Self-pay | Admitting: "Endocrinology

## 2023-06-20 ENCOUNTER — Other Ambulatory Visit: Payer: Self-pay | Admitting: Nurse Practitioner

## 2023-06-20 MED ORDER — LOSARTAN POTASSIUM 25 MG PO TABS: 25.0000 mg | ORAL_TABLET | Freq: Every day | ORAL | 3 refills | Status: DC

## 2023-07-13 ENCOUNTER — Ambulatory Visit (INDEPENDENT_AMBULATORY_CARE_PROVIDER_SITE_OTHER): Payer: Medicare Other | Admitting: Neurology

## 2023-07-13 DIAGNOSIS — M21371 Foot drop, right foot: Secondary | ICD-10-CM | POA: Diagnosis not present

## 2023-07-13 DIAGNOSIS — M21372 Foot drop, left foot: Secondary | ICD-10-CM

## 2023-07-13 DIAGNOSIS — Z9889 Other specified postprocedural states: Secondary | ICD-10-CM

## 2023-07-13 NOTE — Progress Notes (Signed)
 Follow-up Visit   Date: 07/13/2023    Julie May MRN: [id number] DOB: 04-22-1951    Julie May is a 73 y.o. right-handed Caucasian female with hypertension returning to the clinic for follow-up of bilateral foot drop.  The patient was accompanied to the clinic by self.  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  EMG shows severe axonal changes as well as slow-demyelinating features suggestive of CMT. Genetic testing was declined.  Supportive care such as orthotics and braces, if comfortable, were discussed.  PT for balance was offered, she will think about this and let me know if she would like to proceed.  In the meantime, she will start home exercises.  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  She does not have any symptoms related to this and proximal strength is intact.  MRI showed distal sacrum fracture.  She denies any pain and is not interested on surgery.  She would like to continue conservative treatment.  Return to clinic as needed  --------------------------------------------- History of present illness: She had lumbar decompression in 2021 after presenting with left foot drop with MRI showing severe spinal canal stenosis at L4-5.  Her foot drop resolved with surgery.  For the past year, she began noticing that her legs started getting weak and she feels unsteady, walks unassisted.   She has fallen twice. She has weakness in the feet, worse on the left.  She denies numbness/tingling of the feet or hand weakness.    Mother had feet that look similar with high arches and hammer toes.  She reports rolling her ankles frequently even as a child.    She is retired catering manager.  UPDATE 07/13/2023:  She is here for NCS/EMG of the legs.  There has been no interval change in her symptoms. She continues to have bilateral foot drop.  Mother has high arches and hammer toes as her, but lived into her 90s requiring gait  assistance very late in life.   Medications:  Current Outpatient Medications on File Prior to Visit  Medication Sig Dispense Refill   Cholecalciferol  (VITAMIN D ) 2000 units CAPS Take 2,000 Units by mouth daily with lunch.      losartan  (COZAAR ) 25 MG tablet Take 1 tablet (25 mg total) by mouth daily. 90 tablet 3   TIROSINT  50 MCG CAPS Take 1 capsule (50 mcg total) by mouth daily before breakfast. 90 capsule 1   No current facility-administered medications on file prior to visit.    Allergies: No Known Allergies  Vital Signs:  There were no vitals taken for this visit.   Neurological Exam: MENTAL STATUS including orientation to time, place, person, recent and remote memory, attention span and concentration, language, and fund of knowledge is normal.  Speech is not dysarthric.  CRANIAL NERVES:   Face is symmetric.  MOTOR:  Bilateral pes cavus with hammer toes.  Bilateral lower leg atrophy. Bilateral leg foot drop with flexion contracture.  Motor strength proximally is 5/5 and 3/5 distally.     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  Overall, these findings are severe in degree electrically.    MRI lumbar spine wo contrast 06/27/2023: 1. Transitional lumbosacral anatomy. In keeping with the numbering applied to the previous MRI, there is a transitional, partially lumbarized S1 segment. This numbering assumes interval L4-5 PLIF and bilateral ribs at the L1  level. 2. Acute fracture of the mid to distal sacrum asymmetric to the left. 3. Interval posterior decompression and PLIF at L4-5 with well decompressed spinal canal and lateral recesses. Mild residual inferior foraminal narrowing bilaterally without L4 nerve root encroachment. 4. Adjacent segment changes at L3-4 with progressive multifactorial spinal stenosis, now moderate. In  addition, there is moderate lateral recess and mild foraminal narrowing bilaterally. 5. New small right paracentral disc extrusion and superior migration of a small disc fragment at L1-2 without resulting mass effect on the distal thoracic cord or foraminal narrowing. 6. No other significant changes or nerve root encroachment.    Thank you for allowing me to participate in patient's care.  If I can answer any additional questions, I would be pleased to do so.    Sincerely,    Brittne Kawasaki K. Tobie, DO

## 2023-07-13 NOTE — Procedures (Signed)
 [facility]  [address] [place], [address]  [place], [place] [address] Tel: [phone] Fax: [phone] Test Date:  07/13/2023  Patient: Julie May DOB: 1951-06-17 Physician: Tonita Blanch, DO  Sex: Female Height: 5' 5 Ref Phys: Tonita Blanch, DO  ID#: [id number]   Technician:    History: Is a 73 year old female referred for evaluation bilateral foot drop.  NCV & EMG Findings: Extensive electrodiagnostic testing of the right lower extremity and additional studies of the left shows:  Bilateral sural and superficial peroneal sensory responses are absent. Bilateral peroneal (EDB) and tibial motor responses are absent.  Bilateral peroneal motor responses at the tibialis anterior showed markedly prolonged latency (Fib Head, L9.6, R9.8 ms), reduced amplitude (L0.6, R0.7 mV), prolonged distal onset latency (Poplit, L11.7, R13.8 ms), and decreased conduction velocity (Poplit-Fib Head, L38, R20 m/s). Bilateral tibial H reflex studies are absent. Chronic motor axon loss changes are seen affecting nearly all the muscles of the lower extremity and conforming to a gradient pattern, which is worse distally.  There is no evidence of accompanying active denervation.  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  Overall, these findings are severe in degree electrically.   ___________________________ Tonita Blanch, DO    Nerve Conduction Studies   Stim Site NR Peak (ms) Norm Peak (ms) O-P Amp (V) Norm O-P Amp  Left Sup Peroneal Anti Sensory (Ant Lat Mall)  32 C  12 cm *NR  <4.6  >3  Right Sup Peroneal Anti Sensory (Ant Lat Mall)  32 C  12 cm *NR  <4.6  >3  Left Sural Anti Sensory (Lat Mall)  32 C  Calf *NR  <4.6  >3  Right Sural Anti Sensory (Lat Mall)  32 C  Calf *NR  <4.6  >3     Stim Site NR Onset (ms) Norm Onset (ms) O-P Amp (mV) Norm O-P Amp Site1 Site2  Delta-0 (ms) Dist (cm) Vel (m/s) Norm Vel (m/s)  Left Peroneal Motor (Ext Dig Brev)  32 C  Ankle *NR  <6.0  >2.5 B Fib Ankle  0.0  >40  B Fib *NR     Poplt B Fib  0.0  >40  Poplt *NR            Right Peroneal Motor (Ext Dig Brev)  32 C  Ankle *NR  <6.0  >2.5 B Fib Ankle  0.0  >40  B Fib *NR     Poplt B Fib  0.0  >40  Poplt *NR            Left Peroneal TA Motor (Tib Ant)  32 C  Fib Head    *9.6 <4.5 *0.6 >3 Poplit Fib Head 2.1 8.0 *38 >40  Poplit    *11.7 <5.7 0.5         Right Peroneal TA Motor (Tib Ant)  32 C  Fib Head    *9.8 <4.5 *0.7 >3 Poplit Fib Head 4.0 8.0 *20 >40  Poplit    *13.8 <5.7 0.4         Left Tibial Motor (Abd Hall Brev)  32 C  Ankle *NR  <6.0  >4 Knee Ankle  0.0  >40  Knee *NR            Right Tibial Motor (Abd Hall Brev)  32 C  Ankle *NR  <6.0  >4 Knee Ankle  0.0  >40  Knee *NR  Electromyography   Side Muscle Ins.Act Fibs Fasc Recrt Amp Dur Poly Activation Comment  Right AntTibialis Nml Nml Nml *SMU *1+ *1+ *1+ Nml *ATR  Right Gastroc Nml Nml Nml *SMU *1+ *1+ *1+ Nml *ATR  Right RectFemoris Nml Nml Nml *2- *1+ *1+ *1+ Nml N/A  Right BicepsFemS Nml Nml Nml *1- *1+ *1+ *1+ Nml N/A  Right GluteusMed Nml Nml Nml Nml Nml Nml Nml Nml N/A  Left AntTibialis Nml Nml Nml *SMU *1+ *1+ *1+ Nml N/A  Left Gastroc Nml Nml Nml *SMU *1+ *1+ *1+ Nml N/A  Left RectFemoris Nml Nml Nml *2- *1+ *1+ *1+ Nml N/A  Left GluteusMed Nml Nml Nml Nml Nml Nml Nml Nml N/A  Left BicepsFemS Nml Nml Nml *1- *1+ *1+ *1+ Nml N/A      Waveforms:

## 2023-07-14 ENCOUNTER — Other Ambulatory Visit (HOSPITAL_COMMUNITY): Payer: Self-pay

## 2023-07-14 ENCOUNTER — Other Ambulatory Visit: Payer: Self-pay

## 2023-07-14 ENCOUNTER — Encounter: Payer: Self-pay | Admitting: "Endocrinology

## 2023-07-14 ENCOUNTER — Telehealth: Payer: Self-pay | Admitting: Neurology

## 2023-07-14 DIAGNOSIS — M21371 Foot drop, right foot: Secondary | ICD-10-CM

## 2023-07-14 NOTE — Telephone Encounter (Signed)
 Caller stated she had appointment with Allena Katz yesterday. Stated at the time she did not want to move forward with physical therapy but as of today she would wish to move forward with it. Stated she needs it to be in [place]

## 2023-07-17 ENCOUNTER — Ambulatory Visit: Payer: Medicare Other | Admitting: Neurology

## 2023-07-17 NOTE — Telephone Encounter (Signed)
 Called patient and left a detailed message per DPR that I have sent her referral in for Physical Therapy. Left contact information incase patient had any questions or concerns.

## 2023-07-17 NOTE — Telephone Encounter (Signed)
 OK to send referral for PT for bilateral foot drop to [facility]

## 2023-07-18 ENCOUNTER — Other Ambulatory Visit: Payer: Self-pay

## 2023-07-18 DIAGNOSIS — E038 Other specified hypothyroidism: Secondary | ICD-10-CM

## 2023-07-18 MED ORDER — TIROSINT 50 MCG PO CAPS
50.0000 ug | ORAL_CAPSULE | Freq: Every day | ORAL | 1 refills | Status: DC
  Filled 2023-07-18 – 2023-08-09 (×2): qty 90, 90d supply, fill #0

## 2023-07-19 ENCOUNTER — Other Ambulatory Visit: Payer: Self-pay

## 2023-07-20 ENCOUNTER — Other Ambulatory Visit (HOSPITAL_COMMUNITY): Payer: Self-pay

## 2023-07-25 NOTE — Therapy (Signed)
 OUTPATIENT PHYSICAL THERAPY LOWER EXTREMITY EVALUATION   Patient Name: Julie May MRN: [id number] DOB:1950-09-14, 73 y.o., female Today's Date: 07/26/2023  END OF SESSION:  PT End of Session - 07/26/23 1300     Visit Number 1    Number of Visits 5    Date for PT Re-Evaluation 08/23/23    Authorization Type Medicare Part A & B    PT Start Time 1150    PT Stop Time 1235    PT Time Calculation (min) 45 min    Equipment Utilized During Treatment Gait belt    Activity Tolerance Patient tolerated treatment well    Behavior During Therapy WFL for tasks assessed/performed             Past Medical History:  Diagnosis Date   Bilateral impacted cerumen 04/10/2019   Callus of foot 11/07/2017   Foot pain, bilateral 05/23/2013   Hyperlipidemia    Hyperpotassemia    Hypertension    new dx 11/12/2010   Hypothyroidism    OA (osteoarthritis)    Other abnormal glucose    Overweight(278.02)    Polycythemia, secondary    Routine general medical examination at a health care facility    THYROID  NODULE 01/18/2010   Qualifier: Diagnosis of  By: Washington Hacker MD, Sean A    Trigger finger (acquired)    Unspecified disorder of thyroid     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 Hobart Lulas Spine   TONSILECTOMY, ADENOIDECTOMY, BILATERAL MYRINGOTOMY AND TUBES     TONSILLECTOMY     uterine polyp removal      Patient Active Problem List   Diagnosis Date Noted   Left foot drop 05/07/2023   Encounter for immunization 05/07/2023   Low vitamin D  level 05/07/2023   History of UTI 04/30/2023   Elevated LFTs 11/02/2022   Cat bite of forearm 10/29/2022   Spondylolisthesis of lumbar region 01/28/2020   Callus of foot 11/07/2017   Prediabetes 08/15/2017    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   Osteopenia 04/04/2015   Right foot pain 05/23/2013   Essential hypertension, benign 11/12/2010   Hypothyroidism 01/18/2010   Mixed hyperlipidemia 12/17/2009    PCP: Towanda Fret, MD  REFERRING PROVIDER: Daryel Ensign, DO  REFERRING DIAG: M21.371,M21.372 (ICD-10-CM) - Bilateral foot-drop  THERAPY DIAG:  Difficulty in walking, not elsewhere classified  Muscle weakness of lower extremity  Rationale for Evaluation and Treatment: Rehabilitation  ONSET DATE: Years ago  SUBJECTIVE:   SUBJECTIVE STATEMENT: EVAL: Arrives to the clinic due to foot drop and and walking outside her feet. Patient is also concerned about her balance. Denies any numbness. Condition with the foot drop began when she had spinal stenosis. Patient then had back surgery in July 2021 which fixed the foot drop. Recently, 3-4 months ago, the issues on her feet were back. 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 Patient was then referred to outpatient PT evaluation and management.  PERTINENT HISTORY: CMTD, PLIF PAIN:  Are you having pain? No  PRECAUTIONS: Fall  RED FLAGS: None   WEIGHT BEARING RESTRICTIONS: No  FALLS:  Has patient fallen in last 6 months? Yes. Number of falls 1 (6 weeks to 2 months ago)  LIVING ENVIRONMENT:  Lives with: lives with their spouse Lives in: House/apartment Stairs: No Has following equipment at home: Environmental consultant - 2 wheeled  OCCUPATION: retired  PLOF: Independent and Independent with basic ADLs  PATIENT GOALS: "to strengthen my legs and feet"  NEXT MD VISIT: February 2025  OBJECTIVE:  Note: Objective measures were completed at Evaluation unless otherwise noted.  DIAGNOSTIC FINDINGS:  06/27/23 MRI LUMBAR SPINE WITHOUT CONTRAST   TECHNIQUE: Multiplanar, multisequence MR imaging of the lumbar spine was performed. No intravenous contrast was administered.   COMPARISON:  Lumbar  spine radiograph 01/28/2020 and 03/01/2021. Lumbar MRI 01/02/2020.   FINDINGS: Segmentation: Transitional lumbosacral anatomy. In keeping with the numbering applied to the previous MRI, there is a transitional, partially lumbarized S1 segment. This numbering assumes interval L4-5 PLIF and bilateral ribs at the L1 level.   Alignment: Stable grade 1 fused anterolisthesis at L4-5 and similar grade 1 anterolisthesis at L5-S1.   Vertebrae: No worrisome osseous lesion, acute fracture or pars defect within the lumbar spine. Sagittal images demonstrate marrow edema and a fracture of the mid to distal sacrum asymmetric to the left. Interval L4-5 posterior decompression and PLIF.   Conus medullaris: Extends to the L2 level and appears normal.   Paraspinal and other soft tissues: No significant paraspinal findings. Mild fatty atrophy within the erector spinae musculature inferiorly.   Disc levels:   Sagittal images demonstrate mild lower thoracic spondylosis without evidence of significant spinal stenosis or foraminal narrowing.   L1-2: Mild loss of disc height with a new small right paracentral disc extrusion and superior migration of a small disc fragment. No resulting mass effect on the distal thoracic cord or foraminal narrowing.   L2-3: Preserved disc height with stable mild disc bulging and a small central annular fissure. Mild bilateral facet hypertrophy. No significant spinal stenosis or nerve root encroachment.   L3-4: Adjacent segment changes with mildly progressive loss of disc height, annular disc bulging and moderate facet and ligamentous hypertrophy. Resulting progressive multifactorial spinal stenosis, now moderate. In addition, there is moderate lateral recess and mild foraminal narrowing bilaterally.   L4-5: Interval posterior decompression and PLIF. The spinal canal and lateral recesses are well decompressed. Mild residual inferior foraminal narrowing bilaterally  due to spur without L4 nerve root encroachment.   L5-S1: Preserved disc height with mild disc bulging and moderate facet hypertrophy. No spinal stenosis or nerve root encroachment.   IMPRESSION: 1. Transitional lumbosacral anatomy. In keeping with the numbering applied to the previous MRI, there is a transitional, partially lumbarized S1 segment. This numbering assumes interval L4-5 PLIF and bilateral ribs at the L1 level. 2. Acute fracture of the mid to distal sacrum asymmetric to the left. 3. Interval posterior decompression and PLIF at L4-5 with well decompressed spinal canal and lateral recesses. Mild residual inferior foraminal narrowing bilaterally without L4 nerve root encroachment. 4. Adjacent segment changes at L3-4 with progressive multifactorial spinal stenosis, now moderate. In addition, there is moderate lateral recess and mild foraminal narrowing bilaterally. 5. New small right paracentral disc extrusion and superior migration of a small disc fragment at L1-2 without resulting mass effect on the distal thoracic cord or foraminal narrowing. 6. No other significant changes or nerve root encroachment.  PATIENT SURVEYS:  ABC scale 1280/1800 = 80%  COGNITION: Overall cognitive status: Within functional limits for tasks assessed     SENSATION: Light touch: WFL on B LE  MUSCLE LENGTH: Hamstrings: mild restriction on B Thomas test: moderate restriction on B Gastrocsoleus: moderate to severe restriction  on B  POSTURE: rounded shoulders, forward head, increased thoracic kyphosis, and highly supinated feet, very slight genu varus  LOWER EXTREMITY ROM:  Active ROM Right eval Left eval  Hip flexion [facility]  Hip extension [facility]  Hip abduction [facility]  Hip adduction    Hip internal rotation    Hip external rotation    Knee flexion [facility]  Knee extension [facility]  Ankle dorsiflexion 50-30 50-40  Ankle plantarflexion 50-56 50-56  Ankle inversion 20-50 10-40   Ankle eversion 20-10 10-8   (Blank rows = not tested)  LOWER EXTREMITY MMT:  MMT Right eval Left eval  Hip flexion 4- 4-  Hip extension 4- 4-  Hip abduction 4- 4-  Hip adduction    Hip internal rotation    Hip external rotation    Knee flexion 4 4  Knee extension 4 4  Ankle dorsiflexion 2+ 2-  Ankle plantarflexion 3+ 3+  Ankle inversion 4 4  Ankle eversion 2- 2-   (Blank rows = not tested)   FUNCTIONAL TESTS:  5 times sit to stand: 9.59 sec 2 minute walk test: 349 ft Dynamic Gait Index: 14  DGI 07/26/23 1. Gait level surface (1) Moderate Impairment: Walks 20', slow speed, abnormal gait pattern, evidence for imbalance. 2. Change in gait speed (2) Mild Impairment: Is able to change speed but demonstrates mild gait deviations, or not gait deviations but unable to achieve a significant change in velocity, or uses an assistive device. 3. Gait with horizontal head turns (2) Mild Impairment: Performs head turns smoothly with slight change in gait velocity, i.e., minor disruption to smooth gait path or uses walking aid. 4. Gait with vertical head turns (2) Mild Impairment: Performs head turns smoothly with slight change in gait velocity, i.e., minor disruption to smooth gait path or uses walking aid. 5. Gait and pivot turn (2) Mild Impairment: Pivot turns safely in > 3 seconds and stops with no loss of balance. 6. Step over obstacle (1) Moderate Impairment: Is able to step over box but must stop, then step over. May require verbal cueing. 7. Step around obstacles (2) Mild Impairment: Is able to step around both cones, but must slow down and adjust steps to clear cones. 8. Stairs (2) Mild Impairment: Alternating feet, must use rail.  TOTAL SCORE: 14 / 24  GAIT: Distance walked: 349 ft Assistive device utilized: None Level of assistance: Complete Independence Comments: done during , patient demonstrates foot slap, excessive hip flexion on preswing, inadequate hip ext on  terminal swing                                                                                                                                TREATMENT DATE:  07/26/23 Evaluation and patient education done    PATIENT EDUCATION:  Education details: Educated on the pathoanatomy of pes cavus and foot drop. Educated on the goals and course of rehab. Educated  on measures to reduce falls at home Person educated: Patient Education method: Explanation Education comprehension: verbalized understanding  HOME EXERCISE PROGRAM: None provided to date.  ASSESSMENT:  CLINICAL IMPRESSION: EVAL: Patient is a 73 y.o. female who was seen today for physical therapy evaluation and treatment for B foot drop. Patient's condition is further defined by difficulty with walking due to weakness, impaired proprioception and decreased soft tissue extensibility. Skilled PT is required to address the impairments and functional limitations listed below. Notified PCP via Epic about findings of the MRI.    OBJECTIVE IMPAIRMENTS: Abnormal gait, decreased activity tolerance, decreased balance, difficulty walking, decreased ROM, decreased strength, impaired flexibility, and postural dysfunction.   ACTIVITY LIMITATIONS: standing, squatting, stairs, and transfers  PARTICIPATION LIMITATIONS: meal prep, cleaning, laundry, driving, shopping, community activity, and yard work  PERSONAL FACTORS: Age, Time since onset of injury/illness/exacerbation, and 1-2 comorbidities: CMTD, PLIF  are also affecting patient's functional outcome.   REHAB POTENTIAL: Fair    CLINICAL DECISION MAKING: Evolving/moderate complexity  EVALUATION COMPLEXITY: Moderate   GOALS: Goals reviewed with patient? Yes  SHORT TERM GOALS: Target date: 08/09/23 Pt will demonstrate indep in HEP to facilitate carry-over of skilled services and improve functional outcomes Goal status: INITIAL  LONG TERM GOALS: Target date: 08/23/23  Pt will improve DGI by  at least 3 points in order to demonstrate clinically significant improvement in balance and decreased risk for falls Baseline: 14 Goal status: INITIAL  2.  Pt will increase by at least 40 ft in order to demonstrate clinically significant improvement in community ambulation Baseline: 349 ft Goal status: INITIAL  3.  Pt will demonstrate increase in ankle ROM by 5-10 deg  to facilitate ease in ADLs  and ambulation Baseline: see above Goal status: INITIAL  4.  Pt will demonstrate increase in LE strength to 3+/5 to facilitate ease and safety in ambulation Baseline: 2-/5 Goal status: INITIAL  PLAN:  PT FREQUENCY: 1x/week  PT DURATION: 4 weeks  PLANNED INTERVENTIONS: 97164- PT Re-evaluation, 97110-Therapeutic exercises, 97530- Therapeutic activity, 97112- Neuromuscular re-education, 97535- Self Care, 16109- Manual therapy, [id number]- Gait training, [id number]- Orthotic Fit/training, 97014- Electrical stimulation (unattended), [phone]- Electrical stimulation (manual), and Patient/Family education  PLAN FOR NEXT SESSION: Provide HEP. Begin LE flexibility and strengthening. May introduce electrical stimulation for ankle evertors.   Bonita Bussing. Conchita Truxillo, PT, DPT, OCS Board-Certified Clinical Specialist in Orthopedic PT PT Compact Privilege # ([place]): [id number] T 07/26/2023, 1:02 PM

## 2023-07-26 ENCOUNTER — Ambulatory Visit (HOSPITAL_COMMUNITY): Payer: Medicare Other | Attending: Neurology

## 2023-07-26 ENCOUNTER — Other Ambulatory Visit: Payer: Self-pay

## 2023-07-26 DIAGNOSIS — M21371 Foot drop, right foot: Secondary | ICD-10-CM | POA: Diagnosis not present

## 2023-07-26 DIAGNOSIS — R262 Difficulty in walking, not elsewhere classified: Secondary | ICD-10-CM | POA: Insufficient documentation

## 2023-07-26 DIAGNOSIS — M6281 Muscle weakness (generalized): Secondary | ICD-10-CM | POA: Diagnosis not present

## 2023-07-26 DIAGNOSIS — M21372 Foot drop, left foot: Secondary | ICD-10-CM | POA: Insufficient documentation

## 2023-08-02 ENCOUNTER — Ambulatory Visit (INDEPENDENT_AMBULATORY_CARE_PROVIDER_SITE_OTHER): Payer: Medicare Other

## 2023-08-02 ENCOUNTER — Telehealth: Payer: Self-pay | Admitting: "Endocrinology

## 2023-08-02 ENCOUNTER — Encounter: Payer: Self-pay | Admitting: "Endocrinology

## 2023-08-02 VITALS — BP 96/51 | Ht 65.0 in | Wt 150.0 lb

## 2023-08-02 DIAGNOSIS — Z1231 Encounter for screening mammogram for malignant neoplasm of breast: Secondary | ICD-10-CM

## 2023-08-02 DIAGNOSIS — Z0000 Encounter for general adult medical examination without abnormal findings: Secondary | ICD-10-CM | POA: Diagnosis not present

## 2023-08-02 DIAGNOSIS — Z780 Asymptomatic menopausal state: Secondary | ICD-10-CM

## 2023-08-02 NOTE — Patient Instructions (Signed)
Ms. Abbondanza , Thank you for taking time to come for your Medicare Wellness Visit. I appreciate your ongoing commitment to your health goals. Please review the following plan we discussed and let me know if I can assist you in the future.   Referrals/Orders/Follow-Ups/Clinician Recommendations:  Next Medicare Annual Wellness Visit: August 12, 2024 at 8:00 am video visit  You have an order for:  []   2D Mammogram  [x]   3D Mammogram  [x]   Bone Density   []   Lung Cancer Screening  Please call for appointment:   [facility] [address] [place], [place] [address] [phone]  Make sure to wear two-piece clothing.  No lotions powders or deodorants the day of the appointment Make sure to bring picture ID and insurance card.  Bring list of medications you are currently taking including any supplements.   Schedule your [place] screening mammogram through MyChart!   Log into your MyChart account.  Go to 'Visit' (or 'Appointments' if on mobile App) --> Schedule an Appointment  Under 'Select a Reason for Visit' choose the Mammogram Screening option.  Complete the pre-visit questions and select the time and place that best fits your schedule.    This is a list of the screening recommended for you and due dates:  Health Maintenance  Topic Date Due   Pneumonia Vaccine (2 of 2 - PPSV23 or PCV20) 09/14/2019   DEXA scan (bone density measurement)  05/22/2021   COVID-19 Vaccine (6 - 2024-25 season) 03/12/2023   Mammogram  07/09/2023   Medicare Annual Wellness Visit  08/01/2024   Colon Cancer Screening  03/30/2026   DTaP/Tdap/Td vaccine (3 - Td or Tdap) 10/26/2032   Flu Shot  Completed   Hepatitis C Screening  Completed   Zoster (Shingles) Vaccine  Completed   HPV Vaccine  Aged Out    Advanced directives: (Declined) Advance directive discussed with you today. Even though you declined this today, please call our office should you change your  mind, and we can give you the proper paperwork for you to fill out.  Next Medicare Annual Wellness Visit scheduled for next year: yes  Preventive Care 13 Years and Older, Female Preventive care refers to lifestyle choices and visits with your health care provider that can promote health and wellness. Preventive care visits are also called wellness exams. What can I expect for my preventive care visit? Counseling Your health care provider may ask you questions about your: Medical history, including: Past medical problems. Family medical history. Pregnancy and menstrual history. History of falls. Current health, including: Memory and ability to understand (cognition). Emotional well-being. Home life and relationship well-being. Sexual activity and sexual health. Lifestyle, including: Alcohol, nicotine or tobacco, and drug use. Access to firearms. Diet, exercise, and sleep habits. Work and work Astronomer. Sunscreen use. Safety issues such as seatbelt and bike helmet use. Physical exam Your health care provider will check your: Height and weight. These may be used to calculate your BMI (body mass index). BMI is a measurement that tells if you are at a healthy weight. Waist circumference. This measures the distance around your waistline. This measurement also tells if you are at a healthy weight and may help predict your risk of certain diseases, such as type 2 diabetes and high blood pressure. Heart rate and blood pressure. Body temperature. Skin for abnormal spots. What immunizations do I need?  Vaccines are usually given at various ages, according to a schedule. Your health  care provider will recommend vaccines for you based on your age, medical history, and lifestyle or other factors, such as travel or where you work. What tests do I need? Screening Your health care provider may recommend screening tests for certain conditions. This may include: Lipid and cholesterol  levels. Hepatitis C test. Hepatitis B test. HIV (human immunodeficiency virus) test. STI (sexually transmitted infection) testing, if you are at risk. Lung cancer screening. Colorectal cancer screening. Diabetes screening. This is done by checking your blood sugar (glucose) after you have not eaten for a while (fasting). Mammogram. Talk with your health care provider about how often you should have regular mammograms. BRCA-related cancer screening. This may be done if you have a family history of breast, ovarian, tubal, or peritoneal cancers. Bone density scan. This is done to screen for osteoporosis. Talk with your health care provider about your test results, treatment options, and if necessary, the need for more tests. Follow these instructions at home: Eating and drinking  Eat a diet that includes fresh fruits and vegetables, whole grains, lean protein, and low-fat dairy products. Limit your intake of foods with high amounts of sugar, saturated fats, and salt. Take vitamin and mineral supplements as recommended by your health care provider. Do not drink alcohol if your health care provider tells you not to drink. If you drink alcohol: Limit how much you have to 0-1 drink a day. Know how much alcohol is in your drink. In the [place] one drink equals one 12 oz bottle of beer (355 mL), one 5 oz glass of wine (148 mL), or one 1 oz glass of hard liquor (44 mL). Lifestyle Brush your teeth every morning and night with fluoride toothpaste. Floss one time each day. Exercise for at least 30 minutes 5 or more days each week. Do not use any products that contain nicotine or tobacco. These products include cigarettes, chewing tobacco, and vaping devices, such as e-cigarettes. If you need help quitting, ask your health care provider. Do not use drugs. If you are sexually active, practice safe sex. Use a condom or other form of protection in order to prevent STIs. Take aspirin only as told by your  health care provider. Make sure that you understand how much to take and what form to take. Work with your health care provider to find out whether it is safe and beneficial for you to take aspirin daily. Ask your health care provider if you need to take a cholesterol-lowering medicine (statin). Find healthy ways to manage stress, such as: Meditation, yoga, or listening to music. Journaling. Talking to a trusted person. Spending time with friends and family. Minimize exposure to UV radiation to reduce your risk of skin cancer. Safety Always wear your seat belt while driving or riding in a vehicle. Do not drive: If you have been drinking alcohol. Do not ride with someone who has been drinking. When you are tired or distracted. While texting. If you have been using any mind-altering substances or drugs. Wear a helmet and other protective equipment during sports activities. If you have firearms in your house, make sure you follow all gun safety procedures. What's next? Visit your health care provider once a year for an annual wellness visit. Ask your health care provider how often you should have your eyes and teeth checked. Stay up to date on all vaccines. This information is not intended to replace advice given to you by your health care provider. Make sure you discuss any questions you have  with your health care provider. Document Revised: 12/23/2020 Document Reviewed: 12/23/2020 Elsevier Patient Education  2024 [employer].  Understanding Your Risk for Falls Millions of people have serious injuries from falls each year. It is important to understand your risk of falling. Talk with your health care provider about your risk and what you can do to lower it. If you do have a serious fall, make sure to tell your provider. Falling once raises your risk of falling again. How can falls affect me? Serious injuries from falls are common. These include: Broken bones, such as hip  fractures. Head injuries, such as traumatic brain injuries (TBI) or concussions. A fear of falling can cause you to avoid activities and stay at home. This can make your muscles weaker and raise your risk for a fall. What can increase my risk? There are a number of risk factors that increase your risk for falling. The more risk factors you have, the higher your risk of falling. Serious injuries from a fall happen most often to people who are older than 74 years old. Teenagers and young adults ages 59-29 are also at higher risk. Common risk factors include: Weakness in the lower body. Being generally weak or confused due to long-term (chronic) illness. Dizziness or balance problems. Poor vision. Medicines that cause dizziness or drowsiness. These may include: Medicines for your blood pressure, heart, anxiety, insomnia, or swelling (edema). Pain medicines. Muscle relaxants. Other risk factors include: Drinking alcohol. Having had a fall in the past. Having foot pain or wearing improper footwear. Working at a dangerous job. Having any of the following in your home: Tripping hazards, such as floor clutter or loose rugs. Poor lighting. Pets. Having dementia or memory loss. What actions can I take to lower my risk of falling?     Physical activity Stay physically fit. Do strength and balance exercises. Consider taking a regular class to build strength and balance. Yoga and tai chi are good options. Vision Have your eyes checked every year and your prescription for glasses or contacts updated as needed. Shoes and walking aids Wear non-skid shoes. Wear shoes that have rubber soles and low heels. Do not wear high heels. Do not walk around the house in socks or slippers. Use a cane or walker as told by your provider. Home safety Attach secure railings on both sides of your stairs. Install grab bars for your bathtub, shower, and toilet. Use a non-skid mat in your bathtub or shower.  Attach bath mats securely with double-sided, non-slip rug tape. Use good lighting in all rooms. Keep a flashlight near your bed. Make sure there is a clear path from your bed to the bathroom. Use night-lights. Do not use throw rugs. Make sure all carpeting is taped or tacked down securely. Remove all clutter from walkways and stairways, including extension cords. Repair uneven or broken steps and floors. Avoid walking on icy or slippery surfaces. Walk on the grass instead of on icy or slick sidewalks. Use ice melter to get rid of ice on walkways in the winter. Use a cordless phone. Questions to ask your health care provider Can you help me check my risk for a fall? Do any of my medicines make me more likely to fall? Should I take a vitamin D supplement? What exercises can I do to improve my strength and balance? Should I make an appointment to have my vision checked? Do I need a bone density test to check for weak bones (osteoporosis)? Would it help  to use a cane or a walker? Where to find more information Centers for Disease Control and Prevention, [employer]: TonerPromos.no Community-Based Fall Prevention Programs: TonerPromos.no [employer] on Aging: BaseRingTones.pl Contact a health care provider if: You fall at home. You are afraid of falling at home. You feel weak, drowsy, or dizzy. This information is not intended to replace advice given to you by your health care provider. Make sure you discuss any questions you have with your health care provider. Document Revised: 02/28/2022 Document Reviewed: 02/28/2022 Elsevier Patient Education  2024 [employer].

## 2023-08-02 NOTE — Telephone Encounter (Signed)
Clide Dales (Key: [id number])  form thumbnail This request has received a Favorable outcome.  Please note any additional information provided by [facility] at the bottom of your screen.  Pt notified

## 2023-08-02 NOTE — Telephone Encounter (Signed)
PA for [employer] was submitted through Cover my meds. Response pending. (Key: [id number]) PA Case ID #: [id number]

## 2023-08-02 NOTE — Progress Notes (Signed)
Because this visit was a virtual/telehealth visit,  certain criteria was not obtained, such a blood pressure, CBG if applicable, and timed get up and go. Any medications not marked as "taking" were not mentioned during the medication reconciliation part of the visit. Any vitals not documented were not able to be obtained due to this being a telehealth visit or patient was unable to self-report a recent blood pressure reading due to a lack of equipment at home via telehealth. Vitals that have been documented are verbally provided by the patient.  Interactive audio and video telecommunications were attempted between this provider and patient, however failed, due to patient having technical difficulties OR patient did not have access to video capability.  We continued and completed visit with audio only.  Subjective:   Julie May is a 73 y.o. female who presents for Medicare Annual (Subsequent) preventive examination.  Visit Complete: Virtual I connected with  Julie May on 08/02/23 by a audio enabled telemedicine application and verified that I am speaking with the correct person using two identifiers.  Patient Location: Home  Provider Location: Office/Clinic  I discussed the limitations of evaluation and management by telemedicine. The patient expressed understanding and agreed to proceed.  Vital Signs: Because this visit was a virtual/telehealth visit, some criteria may be missing or patient reported. Any vitals not documented were not able to be obtained and vitals that have been documented are patient reported.  Patient Medicare AWV questionnaire was completed by the patient on 08/01/2023; I have confirmed that all information answered by patient is correct and no changes since this date.  Cardiac Risk Factors include: advanced age (>67men, >58 women);dyslipidemia;hypertension     Objective:    Today's Vitals   08/02/23 1401  BP: (!) 96/51  Weight: 150 lb (68 kg)  Height: 5\' 5"  (1.651  m)   Body mass index is 24.96 kg/m.     08/02/2023    2:00 PM 07/26/2023   11:51 AM 05/29/2023    9:52 AM 10/29/2022    5:28 PM 10/29/2022   10:15 AM 05/02/2022    2:46 PM 04/28/2021    1:52 PM  Advanced Directives  Does Patient Have a Medical Advance Directive? No Yes Yes No No Yes No  Type of Advance Directive  Living will Healthcare Power of [place];Living will      Does patient want to make changes to medical advance directive?  No - Patient declined    Yes (MAU/Ambulatory/Procedural Areas - Information given)   Would patient like information on creating a medical advance directive? No - Patient declined   No - Patient declined No - Patient declined  No - Patient declined    Current Medications (verified) Outpatient Encounter Medications as of 08/02/2023  Medication Sig   Cholecalciferol (VITAMIN D) 2000 units CAPS Take 2,000 Units by mouth daily with lunch.    Levothyroxine Sodium (TIROSINT) 50 MCG CAPS Take 1 capsule (50 mcg total) by mouth daily before breakfast.   losartan (COZAAR) 25 MG tablet Take 1 tablet (25 mg total) by mouth daily.   TIROSINT 50 MCG CAPS Take 1 capsule (50 mcg total) by mouth daily before breakfast.   No facility-administered encounter medications on file as of 08/02/2023.    Allergies (verified) Patient has no known allergies.   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 Elie Confer Spine   TONSILECTOMY, ADENOIDECTOMY, BILATERAL MYRINGOTOMY AND TUBES     TONSILLECTOMY     uterine polyp removal      Family History  Problem Relation Age of Onset   Heart attack Father    Heart disease Father    Stroke Mother    Hypertension Mother    Thyroid disease Sister    Hyperlipidemia Brother    Heart disease Brother    Heart attack Brother    Heart disease Brother    Hyperlipidemia Brother    Arthritis Other        family history    Hypertension Other        family history    Stroke Other        family history    Heart disease Other        family history    Social History   Socioeconomic History   Marital status: Married    Spouse name: Lorella Nimrod   Number of children: 0   Years of education: Not on file   Highest education level: Bachelor's degree (e.g., BA, AB, BS)  Occupational History   Occupation: Conservator, museum/gallery   Tobacco Use   Smoking status: Never   Smokeless tobacco: Never  Vaping Use   Vaping status: Never Used  Substance and Sexual Activity   Alcohol use: Not Currently    Alcohol/week: 1.0 standard drink of alcohol    Types: 1 Glasses of wine per week    Comment: occasional beer   Drug use: No   Sexual activity: Yes    Birth control/protection: Post-menopausal  Other Topics Concern   Not on file  Social History Narrative   Married x 36 years 2022.         Are you right handed or left handed? Right Handed    Are you currently employed ? No   What is your current occupation? Retired    Do you live at home alone? No    Who lives with you? With husband    What type of home do you live in: 1 story or 2 story? Lives in a one story home with a basement        Social Drivers of Health   Financial Resource Strain: Low  Risk  (08/02/2023)   Overall Financial Resource Strain (CARDIA)    Difficulty of Paying Living Expenses: Not hard at all  Food Insecurity: No Food Insecurity (08/02/2023)   Hunger Vital Sign    Worried About Running Out of Food in the Last Year: Never true    Ran Out of Food in the Last Year: Never true  Transportation Needs: No Transportation Needs (08/02/2023)   PRAPARE - Administrator, Civil Service (Medical): No  Lack of Transportation (Non-Medical): No  Physical Activity: Sufficiently Active (08/02/2023)   Exercise Vital Sign    Days of Exercise per Week: 7 days    Minutes of Exercise per Session: 30 min  Stress: No Stress Concern Present (08/02/2023)   [employer] of Occupational Health - Occupational Stress Questionnaire    Feeling of Stress : Not at all  Social Connections: Socially Integrated (08/02/2023)   Social Connection and Isolation Panel [NHANES]    Frequency of Communication with Friends and Family: More than three times a week    Frequency of Social Gatherings with Friends and Family: More than three times a week    Attends Religious Services: More than 4 times per year    Active Member of [employer] or Organizations: Yes    Attends Engineer, structural: More than 4 times per year    Marital Status: Married    Tobacco Counseling Counseling given: Yes   Clinical Intake:  Pre-visit preparation completed: Yes  Pain : No/denies pain     BMI - recorded: 24.96 Nutritional Status: BMI of 19-24  Normal Nutritional Risks: None Diabetes: No  How often do you need to have someone help you when you read instructions, pamphlets, or other written materials from your doctor or pharmacy?: 1 - Never  Interpreter Needed?: No  Information entered by :: Maryjean Ka CMA   Activities of Daily Living    08/01/2023   12:43 PM 10/29/2022    5:28 PM  In your present state of health, do you have any difficulty performing the following activities:  Hearing?  0 0  Vision? 0 0  Difficulty concentrating or making decisions? 0 0  Walking or climbing stairs? 0 0  Dressing or bathing? 0 0  Doing errands, shopping? 0 0  Preparing Food and eating ? N   Using the Toilet? N   In the past six months, have you accidently leaked urine? N   Do you have problems with loss of bowel control? N   Managing your Medications? N   Managing your Finances? N   Housekeeping or managing your Housekeeping? N     Patient Care Team: Kerri Perches, MD as PCP - General [place] Chard, DO as Consulting Physician (Neurology)  Indicate any recent Medical Services you may have received from other than [facility] providers in the past year (date may be approximate).     Assessment:   This is a routine wellness examination for [facility].  Hearing/Vision screen Hearing Screening - Comments:: Patient denies any hearing difficulties. Has an appt Friday 08/04/2023 with ENT to check on earwax Vision Screening - Comments:: Wears rx glasses - up to date with routine eye exams  Sees Dr. Mack Hook in [place] w/ [facility]   Goals Addressed             This Visit's Progress    Patient Stated       Improve my balance and the way I walk.         Depression Screen    08/02/2023    2:06 PM 05/04/2023   10:59 AM 04/13/2023    4:16 PM 11/02/2022   11:13 AM 06/23/2022    2:38 PM 05/02/2022    2:47 PM 08/18/2021   10:22 AM  PHQ 2/9 Scores  PHQ - 2 Score 0 0 0 0 0 0 0  PHQ- 9 Score 0          Fall Risk  08/01/2023   12:43 PM 05/29/2023    9:52 AM 05/04/2023   10:59 AM 04/13/2023    4:16 PM 11/02/2022   11:13 AM  Fall Risk   Falls in the past year? 1 1 1 1 1   Number falls in past yr: 0 0 0 0 0  Injury with Fall? 0 0 0 0 0  Risk for fall due to : History of fall(s)  Impaired balance/gait;Orthopedic patient    Follow up Education provided;Falls prevention discussed Falls evaluation completed Falls evaluation completed      MEDICARE RISK AT  HOME: Medicare Risk at Home Any stairs in or around the home?: (Patient-Rptd) Yes If so, are there any without handrails?: (Patient-Rptd) No Home free of loose throw rugs in walkways, pet beds, electrical cords, etc?: (Patient-Rptd) Yes Adequate lighting in your home to reduce risk of falls?: (Patient-Rptd) Yes Life alert?: (Patient-Rptd) No Use of a cane, walker or w/c?: (Patient-Rptd) No Grab bars in the bathroom?: (Patient-Rptd) Yes Shower chair or bench in shower?: (Patient-Rptd) Yes Elevated toilet seat or a handicapped toilet?: (Patient-Rptd) No  TIMED UP AND GO:  Was the test performed?  No    Cognitive Function:        08/02/2023    2:03 PM 05/02/2022    2:48 PM 04/28/2021    1:58 PM 04/16/2020    1:39 PM 04/16/2019   11:41 AM  6CIT Screen  What Year? 0 points 0 points 0 points 0 points 0 points  What month? 0 points 0 points 0 points 0 points 0 points  What time? 0 points 0 points 0 points 0 points 0 points  Count back from 20 0 points 0 points 0 points 0 points 0 points  Months in reverse 0 points 0 points 0 points 0 points 0 points  Repeat phrase 0 points 0 points 0 points 0 points 0 points  Total Score 0 points 0 points 0 points 0 points 0 points    Immunizations Immunization History  Administered Date(s) Administered   Fluad Quad(high Dose 65+) 04/10/2019, 04/13/2020, 04/20/2021, 04/27/2022   Fluad Trivalent(High Dose 65+) 05/04/2023   Influenza Split 06/22/2011   Influenza Whole 05/18/2006, 04/30/2007, 04/14/2008   Influenza,inj,Quad PF,6+ Mos 05/31/2013, 05/28/2014, 04/14/2015, 04/27/2016, 05/16/2017, 04/13/2018   Moderna SARS-COV2 Booster Vaccination 04/29/2021   Moderna Sars-Covid-2 Vaccination 08/23/2019, 09/20/2019, 05/23/2020, 10/30/2020   Pneumococcal Conjugate-13 09/14/2018   Td 08/13/2009   Tdap 10/27/2022   Zoster Recombinant(Shingrix) 03/30/2021, 08/12/2021   Zoster, Live 03/23/2011    TDAP status: Up to date  Flu Vaccine status: Up to  date  Pneumococcal vaccine status: Due, Education has been provided regarding the importance of this vaccine. Advised may receive this vaccine at local pharmacy or Health Dept. Aware to provide a copy of the vaccination record if obtained from local pharmacy or Health Dept. Verbalized acceptance and understanding.  Covid-19 vaccine status: Information provided on how to obtain vaccines.   Qualifies for Shingles Vaccine? No   Zostavax completed Yes   Shingrix Completed?: Yes  Screening Tests Health Maintenance  Topic Date Due   Pneumonia Vaccine 37+ Years old (2 of 2 - PPSV23 or PCV20) 09/14/2019   COVID-19 Vaccine (6 - 2024-25 season) 03/12/2023   Medicare Annual Wellness (AWV)  05/03/2023   MAMMOGRAM  07/08/2024   Colonoscopy  03/30/2026   DTaP/Tdap/Td (3 - Td or Tdap) 10/26/2032   INFLUENZA VACCINE  Completed   DEXA SCAN  Completed   Hepatitis C Screening  Completed   Zoster  Vaccines- Shingrix  Completed   HPV VACCINES  Aged Out    Health Maintenance  Health Maintenance Due  Topic Date Due   Pneumonia Vaccine 93+ Years old (2 of 2 - PPSV23 or PCV20) 09/14/2019   COVID-19 Vaccine (6 - 2024-25 season) 03/12/2023   Medicare Annual Wellness (AWV)  05/03/2023    Colorectal cancer screening: Type of screening: Colonoscopy. Completed 03/30/2016. Repeat every 10 years  Mammogram status: Ordered 08/02/2023. Pt provided with contact info and advised to call to schedule appt.   Bone Density status: Ordered 08/02/2023. Pt provided with contact info and advised to call to schedule appt.  Lung Cancer Screening: (Low Dose CT Chest recommended if Age 71-80 years, 20 pack-year currently smoking OR have quit w/in 15years.) does not qualify.   Lung Cancer Screening Referral: na  Additional Screening:  Hepatitis C Screening: does not qualify; Completed   Vision Screening: Recommended annual ophthalmology exams for early detection of glaucoma and other disorders of the eye. Is the patient  up to date with their annual eye exam?  Yes  Who is the provider or what is the name of the office in which the patient attends annual eye exams? Dr. Mack Hook w/ [facility] in [place] If pt is not established with a provider, would they like to be referred to a provider to establish care? No .   Dental Screening: Recommended annual dental exams for proper oral hygiene  Diabetic Foot Exam: na  Community Resource Referral / Chronic Care Management: CRR required this visit?  No   CCM required this visit?  No     Plan:     I have personally reviewed and noted the following in the patient's chart:   Medical and social history Use of alcohol, tobacco or illicit drugs  Current medications and supplements including opioid prescriptions. Patient is not currently taking opioid prescriptions. Functional ability and status Nutritional status Physical activity Advanced directives List of other physicians Hospitalizations, surgeries, and ER visits in previous 12 months Vitals Screenings to include cognitive, depression, and falls Referrals and appointments  In addition, I have reviewed and discussed with patient certain preventive protocols, quality metrics, and best practice recommendations. A written personalized care plan for preventive services as well as general preventive health recommendations were provided to patient.     Jordan Hawks Chellie Vanlue, CMA   08/02/2023   After Visit Summary: (MyChart) Due to this being a telephonic visit, the after visit summary with patients personalized plan was offered to patient via MyChart   Nurse Notes: see routing comment

## 2023-08-03 ENCOUNTER — Telehealth (INDEPENDENT_AMBULATORY_CARE_PROVIDER_SITE_OTHER): Payer: Self-pay | Admitting: Otolaryngology

## 2023-08-03 NOTE — Telephone Encounter (Signed)
Spoke with patient and confirmed address for 08/04/23.

## 2023-08-04 ENCOUNTER — Encounter (INDEPENDENT_AMBULATORY_CARE_PROVIDER_SITE_OTHER): Payer: Self-pay

## 2023-08-04 ENCOUNTER — Ambulatory Visit (INDEPENDENT_AMBULATORY_CARE_PROVIDER_SITE_OTHER): Payer: Medicare Other | Admitting: Otolaryngology

## 2023-08-04 VITALS — BP 116/80 | HR 70 | Ht 65.0 in | Wt 154.0 lb

## 2023-08-04 DIAGNOSIS — H6123 Impacted cerumen, bilateral: Secondary | ICD-10-CM | POA: Diagnosis not present

## 2023-08-04 NOTE — Progress Notes (Signed)
Patient ID: Julie May, female   DOB: [birth year], 73 y.o.   MRN: [id number]  Procedure: Bilateral cerumen disimpaction.   Indication: Cerumen impaction, resulting in ear discomfort and conductive hearing loss.   Description: The patient is placed supine on the operating table. Under the operating microscope, the right ear canal is examined and is noted to be impacted with cerumen. The cerumen is carefully removed with a combination of suction catheters, cerumen curette, and alligator forceps. After the cerumen removal, the ear canal and tympanic membrane are noted to be normal. No middle ear effusion is noted. The same procedure is then repeated on the left side without exception. The patient tolerated the procedure well.  Follow-up care:  The patient is instructed not to use Q-tips to clean the ear canals. The patient will follow up in 6 months.

## 2023-08-07 ENCOUNTER — Encounter: Payer: Self-pay | Admitting: Family Medicine

## 2023-08-09 ENCOUNTER — Encounter: Payer: Self-pay | Admitting: "Endocrinology

## 2023-08-09 ENCOUNTER — Other Ambulatory Visit (HOSPITAL_COMMUNITY): Payer: Self-pay

## 2023-08-09 ENCOUNTER — Other Ambulatory Visit: Payer: Self-pay

## 2023-08-09 ENCOUNTER — Encounter: Payer: Self-pay | Admitting: Neurology

## 2023-08-09 DIAGNOSIS — E038 Other specified hypothyroidism: Secondary | ICD-10-CM

## 2023-08-09 MED ORDER — TIROSINT 50 MCG PO CAPS
50.0000 ug | ORAL_CAPSULE | Freq: Every day | ORAL | 1 refills | Status: DC
  Filled 2023-08-09: qty 120, 120d supply, fill #0
  Filled 2023-09-11: qty 30, 30d supply, fill #0

## 2023-08-10 ENCOUNTER — Other Ambulatory Visit (HOSPITAL_COMMUNITY): Payer: Self-pay | Admitting: Family Medicine

## 2023-08-10 ENCOUNTER — Other Ambulatory Visit (HOSPITAL_COMMUNITY): Payer: Self-pay

## 2023-08-10 ENCOUNTER — Other Ambulatory Visit: Payer: Self-pay

## 2023-08-10 DIAGNOSIS — M21371 Foot drop, right foot: Secondary | ICD-10-CM

## 2023-08-10 DIAGNOSIS — M4800 Spinal stenosis, site unspecified: Secondary | ICD-10-CM

## 2023-08-10 DIAGNOSIS — N63 Unspecified lump in unspecified breast: Secondary | ICD-10-CM

## 2023-08-10 DIAGNOSIS — Z9889 Other specified postprocedural states: Secondary | ICD-10-CM

## 2023-08-11 ENCOUNTER — Ambulatory Visit (HOSPITAL_COMMUNITY): Payer: Medicare Other

## 2023-08-11 DIAGNOSIS — R262 Difficulty in walking, not elsewhere classified: Secondary | ICD-10-CM

## 2023-08-11 DIAGNOSIS — M6281 Muscle weakness (generalized): Secondary | ICD-10-CM | POA: Diagnosis not present

## 2023-08-11 DIAGNOSIS — M21372 Foot drop, left foot: Secondary | ICD-10-CM | POA: Diagnosis not present

## 2023-08-11 DIAGNOSIS — M21371 Foot drop, right foot: Secondary | ICD-10-CM | POA: Diagnosis not present

## 2023-08-11 NOTE — Therapy (Signed)
OUTPATIENT PHYSICAL THERAPY LOWER EXTREMITY TREATMENT   Patient Name: Julie May MRN: [id number] DOB:1950/12/13, 73 y.o., female Today's Date: 08/11/2023  END OF SESSION:  PT End of Session - 08/11/23 1056     Visit Number 2    Number of Visits 5    Date for PT Re-Evaluation 08/23/23    Authorization Type Medicare Part A & B    PT Start Time 1100    PT Stop Time 1140    PT Time Calculation (min) 40 min    Activity Tolerance Patient tolerated treatment well    Behavior During Therapy WFL for tasks assessed/performed             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 Elie Confer Spine   TONSILECTOMY, ADENOIDECTOMY, BILATERAL MYRINGOTOMY AND TUBES     TONSILLECTOMY     uterine polyp removal      Patient Active Problem List   Diagnosis Date Noted   Impacted cerumen of both ears 08/04/2023   Left foot drop 05/07/2023   Encounter for immunization 05/07/2023   Low vitamin D level 05/07/2023   History of UTI 04/30/2023   Elevated LFTs 11/02/2022   Cat bite of forearm 10/29/2022   Spondylolisthesis of lumbar region 01/28/2020   Callus of foot 11/07/2017   Prediabetes 08/15/2017   Nontoxic  multinodular goiter 09/25/2015   Osteopenia 04/04/2015   Right foot pain 05/23/2013   Essential hypertension, benign 11/12/2010   Hypothyroidism 01/18/2010   Mixed hyperlipidemia 12/17/2009    PCP: Kerri Perches, MD  REFERRING PROVIDER: Glendale Chard, DO  REFERRING DIAG: M21.371,M21.372 (ICD-10-CM) - Bilateral foot-drop  THERAPY DIAG:  Difficulty in walking, not elsewhere classified  Muscle weakness of lower extremity  Rationale for Evaluation and Treatment: Rehabilitation  ONSET DATE: Years ago  SUBJECTIVE:   SUBJECTIVE STATEMENT: Patient has started to walk on the treadmill and noticed that her foot drop is less. Patient denies any pain at the moment. Patient was told by her neurologist that her sacral fx may not be addressed but they are about to set a referral to a spine specialist. Patient states that she was unable to have PT for about 2 weeks because of her busy schedule.  EVAL: Arrives to the clinic due to foot drop and and walking outside her feet. Patient is also concerned about her balance. Denies any numbness. Condition with the foot drop began when she had spinal stenosis. Patient then had back surgery in July 2021 which fixed the foot drop. Recently, 3-4 months ago, the issues on her feet were back. Patient also attributes her issues with  CMTD as what her neurologist has told her. Patient was then referred to outpatient PT evaluation and management.  PERTINENT HISTORY: CMTD, PLIF PAIN:  Are you having pain? No  PRECAUTIONS: Fall  RED FLAGS: None   WEIGHT BEARING RESTRICTIONS: No  FALLS:  Has patient fallen in last 6 months? Yes. 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  DIAGNOSTIC FINDINGS:  06/27/23 MRI LUMBAR SPINE WITHOUT CONTRAST   TECHNIQUE: Multiplanar, multisequence MR imaging of the lumbar spine was performed. No intravenous contrast was administered.   COMPARISON:  Lumbar spine radiograph 01/28/2020 and 03/01/2021. Lumbar MRI 01/02/2020.   FINDINGS: Segmentation: Transitional lumbosacral anatomy. In keeping with the numbering applied to the previous MRI, there is a transitional, partially lumbarized S1 segment. This numbering assumes interval L4-5 PLIF and bilateral ribs at the L1 level.   Alignment: Stable grade 1 fused anterolisthesis at L4-5 and similar grade 1 anterolisthesis at L5-S1.   Vertebrae: No worrisome osseous lesion, acute fracture or pars defect within the lumbar spine. Sagittal images demonstrate marrow edema and a fracture of the mid to distal sacrum asymmetric to the left. Interval L4-5 posterior decompression and PLIF.   Conus medullaris: Extends to the L2 level and appears normal.   Paraspinal and other soft tissues: No significant paraspinal findings. Mild fatty atrophy within the erector spinae musculature inferiorly.   Disc levels:   Sagittal images demonstrate mild lower thoracic spondylosis without evidence of significant spinal stenosis or foraminal narrowing.   L1-2: Mild loss of disc height with a new small right paracentral disc extrusion and superior migration of a small disc fragment. No resulting mass effect on the distal thoracic cord or foraminal narrowing.   L2-3: Preserved disc height with stable mild disc bulging and a small central annular fissure. Mild bilateral facet hypertrophy. No significant spinal stenosis or nerve root encroachment.   L3-4: Adjacent segment changes with mildly progressive loss of disc height, annular disc bulging and moderate facet  and ligamentous hypertrophy. Resulting progressive multifactorial spinal stenosis, now moderate. In addition, there is moderate lateral recess and mild foraminal narrowing bilaterally.   L4-5: Interval posterior decompression and PLIF. The spinal canal and lateral recesses are well decompressed. Mild residual inferior foraminal narrowing bilaterally due to spur without L4 nerve root encroachment.   L5-S1: Preserved disc height with mild disc bulging and moderate facet hypertrophy. No spinal stenosis or nerve root encroachment.   IMPRESSION: 1. Transitional lumbosacral anatomy. In keeping with the numbering applied to the previous MRI, there is a transitional, partially lumbarized S1 segment. This numbering assumes interval L4-5 PLIF and bilateral ribs at the L1 level. 2. Acute fracture of the mid to distal sacrum asymmetric to the left. 3. Interval posterior decompression and PLIF at L4-5 with well decompressed spinal canal and lateral recesses. Mild residual inferior foraminal narrowing bilaterally without L4 nerve root encroachment. 4. Adjacent segment changes at L3-4 with progressive multifactorial spinal stenosis, now moderate. In addition, there is moderate lateral recess and mild foraminal narrowing bilaterally. 5. New small right paracentral disc extrusion and superior migration of a small disc fragment at L1-2 without resulting mass effect  on the distal thoracic cord or foraminal narrowing. 6. No other significant changes or nerve root encroachment.  PATIENT SURVEYS:  ABC scale 1280/1800 = 80%  COGNITION: Overall cognitive status: Within functional limits for tasks assessed     SENSATION: Light touch: WFL on B LE  MUSCLE LENGTH: Hamstrings: mild restriction on B Thomas test: moderate restriction on B Gastrocsoleus: moderate to severe restriction on B  POSTURE: rounded shoulders, forward head, increased thoracic kyphosis, and highly supinated feet, very slight  genu varus  LOWER EXTREMITY ROM:  Active ROM Right eval Left eval  Hip flexion [facility]  Hip extension [facility]  Hip abduction [facility]  Hip adduction    Hip internal rotation    Hip external rotation    Knee flexion [facility]  Knee extension [facility]  Ankle dorsiflexion 50-30 50-40  Ankle plantarflexion 50-56 50-56  Ankle inversion 20-50 10-40  Ankle eversion 20-10 10-8   (Blank rows = not tested)  LOWER EXTREMITY MMT:  MMT Right eval Left eval  Hip flexion 4- 4-  Hip extension 4- 4-  Hip abduction 4- 4-  Hip adduction    Hip internal rotation    Hip external rotation    Knee flexion 4 4  Knee extension 4 4  Ankle dorsiflexion 2+ 2-  Ankle plantarflexion 3+ 3+  Ankle inversion 4 4  Ankle eversion 2- 2-   (Blank rows = not tested)   FUNCTIONAL TESTS:  5 times sit to stand: 9.59 sec 2 minute walk test: 349 ft Dynamic Gait Index: 14  DGI 07/26/23 1. Gait level surface (1) Moderate Impairment: Walks 20', slow speed, abnormal gait pattern, evidence for imbalance. 2. Change in gait speed (2) Mild Impairment: Is able to change speed but demonstrates mild gait deviations, or not gait deviations but unable to achieve a significant change in velocity, or uses an assistive device. 3. Gait with horizontal head turns (2) Mild Impairment: Performs head turns smoothly with slight change in gait velocity, i.e., minor disruption to smooth gait path or uses walking aid. 4. Gait with vertical head turns (2) Mild Impairment: Performs head turns smoothly with slight change in gait velocity, i.e., minor disruption to smooth gait path or uses walking aid. 5. Gait and pivot turn (2) Mild Impairment: Pivot turns safely in > 3 seconds and stops with no loss of balance. 6. Step over obstacle (1) Moderate Impairment: Is able to step over box but must stop, then step over. May require verbal cueing. 7. Step around obstacles (2) Mild Impairment: Is able to step around both cones, but must  slow down and adjust steps to clear cones. 8. Stairs (2) Mild Impairment: Alternating feet, must use rail.  TOTAL SCORE: 14 / 24  GAIT: Distance walked: 349 ft Assistive device utilized: None Level of assistance: Complete Independence Comments: done during , patient demonstrates foot slap, excessive hip flexion on preswing, inadequate hip ext on terminal swing  TREATMENT DATE:  08/11/23 Seated calf stretch x 30" x 3 on each  Toe yoga x 10 x 2 Ankle eversion on a towel x 10 x 2 Seated hip adduction with ball x 3" x 10 x 2 Education on foot arches and arch support  07/26/23 Evaluation and patient education done    PATIENT EDUCATION:  Education details: Educated on the pathoanatomy of pes cavus and foot drop. Educated on the goals and course of rehab. Educated on measures to reduce falls at home Person educated: Patient Education method: Explanation Education comprehension: verbalized understanding  HOME EXERCISE PROGRAM: Access Code: [email or web address] URL: [email or web address] Date: 08/11/2023 Prepared by: Krystal Clark  Exercises - Seated Calf Stretch with Strap  - 1-2 x daily - 7 x weekly - 3 sets - 3 reps - 30 hold - Toe Yoga - Alternating Great Toe and Lesser Toe Extension  - 1-2 x daily - 7 x weekly - 2 sets - 10 reps - Ankle Inversion Eversion Towel Slide  - 1 x daily - 7 x weekly - 2 sets - 10 reps - Seated Hip Adduction Isometrics with Ball  - 1-2 x daily - 7 x weekly - 3 sets - 10 reps - 3 hold  ASSESSMENT:  CLINICAL IMPRESSION: Interventions today were geared towards LE strength and flexibility. Attempted active DF but patient had severe difficulty doing it due to weakness. Patient also had mod difficulty with toe yoga and foot eversion due to weakness. Demonstrated appropriate levels of fatigue. Provided slight amount of cueing  to ensure correct execution of activity with good carry-over. No pain reported on today's session. To date, skilled PT is required to address the impairments and improve function.  EVAL: Patient is a 73 y.o. female who was seen today for physical therapy evaluation and treatment for B foot drop. Patient's condition is further defined by difficulty with walking due to weakness, impaired proprioception and decreased soft tissue extensibility. Skilled PT is required to address the impairments and functional limitations listed below. Notified PCP via Epic about findings of the MRI.    OBJECTIVE IMPAIRMENTS: Abnormal gait, decreased activity tolerance, decreased balance, difficulty walking, decreased ROM, decreased strength, impaired flexibility, and postural dysfunction.   ACTIVITY LIMITATIONS: standing, squatting, stairs, and transfers  PARTICIPATION LIMITATIONS: meal prep, cleaning, laundry, driving, shopping, community activity, and yard work  PERSONAL FACTORS: Age, Time since onset of injury/illness/exacerbation, and 1-2 comorbidities: CMTD, PLIF  are also affecting patient's functional outcome.   REHAB POTENTIAL: Fair    CLINICAL DECISION MAKING: Evolving/moderate complexity  EVALUATION COMPLEXITY: Moderate   GOALS: Goals reviewed with patient? Yes  SHORT TERM GOALS: Target date: 08/09/23 Pt will demonstrate indep in HEP to facilitate carry-over of skilled services and improve functional outcomes Goal status: INITIAL  LONG TERM GOALS: Target date: 08/23/23  Pt will improve DGI by at least 3 points in order to demonstrate clinically significant improvement in balance and decreased risk for falls Baseline: 14 Goal status: INITIAL  2.  Pt will increase by at least 40 ft in order to demonstrate clinically significant improvement in community ambulation Baseline: 349 ft Goal status: INITIAL  3.  Pt will demonstrate increase in ankle ROM by 5-10 deg  to facilitate ease in ADLs  and  ambulation Baseline: see above Goal status: INITIAL  4.  Pt will demonstrate increase in LE strength to 3+/5 to facilitate ease and safety in ambulation Baseline: 2-/5 Goal status: INITIAL  PLAN:  PT FREQUENCY: 1x/week  PT DURATION: 4 weeks  PLANNED INTERVENTIONS: 97164- PT Re-evaluation, 97110-Therapeutic exercises, 97530- Therapeutic activity, [id number]- Neuromuscular re-education, 97535- Self Care, 16109- Manual therapy, [id number]- Gait training, [phone]- Orthotic Fit/training, 97014- Electrical stimulation (unattended), [phone]- Electrical stimulation (manual), and Patient/Family education  PLAN FOR NEXT SESSION: Provide HEP. Begin LE flexibility and strengthening. May introduce electrical stimulation for ankle evertors.   Tish Frederickson. Shadana Pry, PT, DPT, OCS Board-Certified Clinical Specialist in Orthopedic PT PT Compact Privilege # ([place]): [id number] T 08/11/2023, 10:57 AM

## 2023-08-16 ENCOUNTER — Ambulatory Visit (HOSPITAL_COMMUNITY)
Admission: RE | Admit: 2023-08-16 | Discharge: 2023-08-16 | Disposition: A | Discharge status: Home / Self Care | Payer: Medicare Other | Source: Ambulatory Visit | Attending: Family Medicine | Admitting: Family Medicine

## 2023-08-16 DIAGNOSIS — Z0000 Encounter for general adult medical examination without abnormal findings: Secondary | ICD-10-CM | POA: Diagnosis not present

## 2023-08-16 DIAGNOSIS — M8589 Other specified disorders of bone density and structure, multiple sites: Secondary | ICD-10-CM | POA: Diagnosis not present

## 2023-08-16 DIAGNOSIS — Z780 Asymptomatic menopausal state: Secondary | ICD-10-CM | POA: Diagnosis not present

## 2023-08-18 ENCOUNTER — Encounter (HOSPITAL_COMMUNITY): Payer: Medicare Other

## 2023-08-22 ENCOUNTER — Ambulatory Visit: Payer: Medicare Other | Admitting: Orthopedic Surgery

## 2023-08-25 ENCOUNTER — Encounter (HOSPITAL_COMMUNITY): Payer: Medicare Other

## 2023-08-30 ENCOUNTER — Encounter: Payer: Self-pay | Admitting: Family Medicine

## 2023-08-30 ENCOUNTER — Ambulatory Visit: Payer: Medicare Other | Admitting: Neurology

## 2023-08-31 ENCOUNTER — Ambulatory Visit: Payer: Medicare Other | Admitting: Orthopedic Surgery

## 2023-09-01 ENCOUNTER — Encounter (HOSPITAL_COMMUNITY): Payer: Medicare Other

## 2023-09-04 DIAGNOSIS — S3210XA Unspecified fracture of sacrum, initial encounter for closed fracture: Secondary | ICD-10-CM | POA: Diagnosis not present

## 2023-09-04 DIAGNOSIS — Z6825 Body mass index (BMI) 25.0-25.9, adult: Secondary | ICD-10-CM | POA: Diagnosis not present

## 2023-09-11 ENCOUNTER — Ambulatory Visit (INDEPENDENT_AMBULATORY_CARE_PROVIDER_SITE_OTHER): Payer: Medicare Other | Admitting: Orthopedic Surgery

## 2023-09-11 ENCOUNTER — Other Ambulatory Visit (HOSPITAL_COMMUNITY): Payer: Self-pay

## 2023-09-11 DIAGNOSIS — G600 Hereditary motor and sensory neuropathy: Secondary | ICD-10-CM | POA: Diagnosis not present

## 2023-09-11 DIAGNOSIS — L97521 Non-pressure chronic ulcer of other part of left foot limited to breakdown of skin: Secondary | ICD-10-CM | POA: Diagnosis not present

## 2023-09-11 DIAGNOSIS — L97511 Non-pressure chronic ulcer of other part of right foot limited to breakdown of skin: Secondary | ICD-10-CM

## 2023-09-12 ENCOUNTER — Other Ambulatory Visit (HOSPITAL_COMMUNITY): Payer: Self-pay

## 2023-09-12 ENCOUNTER — Other Ambulatory Visit: Payer: Self-pay

## 2023-09-13 ENCOUNTER — Other Ambulatory Visit (HOSPITAL_COMMUNITY): Payer: Self-pay

## 2023-09-13 ENCOUNTER — Other Ambulatory Visit: Payer: Self-pay

## 2023-09-14 ENCOUNTER — Encounter: Payer: Self-pay | Admitting: "Endocrinology

## 2023-09-14 ENCOUNTER — Other Ambulatory Visit: Payer: Self-pay | Admitting: Nurse Practitioner

## 2023-09-14 DIAGNOSIS — I10 Essential (primary) hypertension: Secondary | ICD-10-CM

## 2023-09-17 ENCOUNTER — Encounter: Payer: Self-pay | Admitting: Orthopedic Surgery

## 2023-09-17 NOTE — Progress Notes (Signed)
 Office Visit Note   Patient: Julie May           Date of Birth: 12/21/1950           MRN: [id number] Visit Date: 09/11/2023              Requested by: Kerri Perches, MD [address], [address] [place],  [place] [address] PCP: Kerri Perches, MD  Chief Complaint  Patient presents with   Right Foot - Follow-up   Left Foot - Follow-up      HPI: Patient is a 73 year old woman with Charcot-Marie-Tooth disease involving both lower extremities.  She has had nerve conduction studies to confirm this.  Patient states she has painful calluses on both feet.  Assessment & Plan: Visit Diagnoses:  1. Non-pressure chronic ulcer of other part of left foot limited to breakdown of skin (HCC)   2. Non-pressure chronic ulcer of other part of right foot limited to breakdown of skin (HCC)   3. Charcot Marie Tooth muscular atrophy     Plan: Recommended using hiking poles for ambulation recommended strengthening and physical therapy.  Discussed surgical intervention treatment option would be a tibial calcaneal fusion.  Follow-Up Instructions: Return if symptoms worsen or fail to improve.   Ortho Exam  Patient is alert, oriented, no adenopathy, well-dressed, normal affect, normal respiratory effort. Examination patient has a fixed varus hindfoot deformity bilaterally.  This is not correctable and would not be correctable by tendon transfer.  She has a cavovarus foot bilaterally she has a good dorsalis pedis pulse bilaterally.  Deformity worse on the right than the left.  Imaging: No results found. No images are attached to the encounter.  Labs: Lab Results  Component Value Date   HGBA1C 5.6 (A) 09/29/2022   HGBA1C 5.5 (A) 06/25/2021   HGBA1C 5.7 (H) 05/11/2020   REPTSTATUS 03/30/2023 FINAL 03/28/2023   CULT >=100,000 COLONIES/mL ESCHERICHIA COLI (A) 03/28/2023   LABORGA ESCHERICHIA COLI (A) 03/28/2023     Lab Results  Component Value Date   ALBUMIN 4.3 11/18/2022   ALBUMIN  3.0 (L) 10/30/2022   ALBUMIN 3.5 10/29/2022    No results found for: "MG" Lab Results  Component Value Date   VD25OH 44.4 04/27/2022   VD25OH 45.9 02/11/2021   VD25OH 42 05/11/2020    No results found for: "PREALBUMIN"    Latest Ref Rng & Units 10/30/2022    5:35 AM 10/29/2022   10:32 AM 04/27/2022   10:43 AM  CBC EXTENDED  WBC 4.0 - 10.5 K/uL 6.6  7.7  4.7   RBC 3.87 - 5.11 MIL/uL 4.16  4.54  5.11   Hemoglobin 12.0 - 15.0 g/dL 46.9  62.9  52.8   HCT 36.0 - 46.0 % 35.1  38.4  42.8   Platelets 150 - 400 K/uL 209  228  283      There is no height or weight on file to calculate BMI.  Orders:  No orders of the defined types were placed in this encounter.  No orders of the defined types were placed in this encounter.    Procedures: No procedures performed  Clinical Data: No additional findings.  ROS:  All other systems negative, except as noted in the HPI. Review of Systems  Objective: Vital Signs: There were no vitals taken for this visit.  Specialty Comments:  No specialty comments available.  PMFS History: Patient Active Problem List   Diagnosis Date Noted   Impacted cerumen of both  ears 08/04/2023   Left foot drop 05/07/2023   Encounter for immunization 05/07/2023   Low vitamin D level 05/07/2023   History of UTI 04/30/2023   Elevated LFTs 11/02/2022   Cat bite of forearm 10/29/2022   Spondylolisthesis of lumbar region 01/28/2020   Callus of foot 11/07/2017   Prediabetes 08/15/2017   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   Osteopenia 04/04/2015   Right foot pain 05/23/2013   Essential hypertension, benign 11/12/2010   Hypothyroidism 01/18/2010   Mixed hyperlipidemia 12/17/2009   Past Medical History:  Diagnosis Date   Bilateral impacted cerumen 04/10/2019   Callus of foot 11/07/2017   Foot pain, bilateral 05/23/2013   Hyperlipidemia    Hyperpotassemia    Hypertension    new dx 11/12/2010   Hypothyroidism    OA (osteoarthritis)    Other  abnormal glucose    Overweight(278.02)    Polycythemia, secondary    Routine general medical examination at a health care facility    THYROID NODULE 01/18/2010   Qualifier: Diagnosis of  By: Everardo All MD, Sean A    Trigger finger (acquired)    Unspecified disorder of thyroid    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    Family History  Problem Relation Age of Onset   Heart attack Father    Heart disease Father    Stroke Mother    Hypertension Mother    Thyroid disease Sister    Hyperlipidemia Brother    Heart disease Brother    Heart attack Brother    Heart disease Brother    Hyperlipidemia Brother    Arthritis Other        family history    Hypertension Other        family history    Stroke Other        family history    Heart disease Other        family history     Past Surgical History:  Procedure Laterality Date   COLONOSCOPY N/A 03/30/2016   Procedure: COLONOSCOPY;  Surgeon: Malissa Hippo, MD;  Location: [facility];  Service: Endoscopy;  Laterality: N/A;  1030   SPINE SURGERY Bilateral 01/2020   Dr. Elie Confer Spine   TONSILECTOMY, ADENOIDECTOMY, BILATERAL MYRINGOTOMY AND TUBES     TONSILLECTOMY     uterine polyp removal      Social History   Occupational History   Occupation: Conservator, museum/gallery   Tobacco Use   Smoking status: Never   Smokeless tobacco: Never  Vaping Use   Vaping status: Never Used  Substance and Sexual Activity   Alcohol use: Not Currently    Alcohol/week: 1.0 standard drink of alcohol    Types: 1 Glasses of wine per week    Comment: occasional beer   Drug use: No   Sexual activity: Yes    Birth control/protection: Post-menopausal

## 2023-09-21 ENCOUNTER — Encounter: Payer: Self-pay | Admitting: "Endocrinology

## 2023-09-28 DIAGNOSIS — E038 Other specified hypothyroidism: Secondary | ICD-10-CM | POA: Diagnosis not present

## 2023-09-28 DIAGNOSIS — E782 Mixed hyperlipidemia: Secondary | ICD-10-CM | POA: Diagnosis not present

## 2023-09-29 LAB — LIPID PANEL
Chol/HDL Ratio: 1.9 ratio (ref 0.0–4.4)
Cholesterol, Total: 149 mg/dL (ref 100–199)
HDL: 79 mg/dL (ref >39)
LDL Chol Calc (NIH): 60 mg/dL (ref 0–99)
Triglycerides: 44 mg/dL (ref 0–149)
VLDL Cholesterol Cal: 10 mg/dL (ref 5–40)

## 2023-09-29 LAB — COMPREHENSIVE METABOLIC PANEL
ALT: 18 IU/L (ref 0–32)
AST: 23 IU/L (ref 0–40)
Albumin: 4.3 g/dL (ref 3.8–4.8)
Alkaline Phosphatase: 98 IU/L (ref 44–121)
BUN/Creatinine Ratio: 16 (ref 12–28)
BUN: 11 mg/dL (ref 8–27)
Bilirubin Total: 0.5 mg/dL (ref 0.0–1.2)
CO2: 24 mmol/L (ref 20–29)
Calcium: 9.6 mg/dL (ref 8.7–10.3)
Chloride: 96 mmol/L (ref 96–106)
Creatinine, Ser: 0.7 mg/dL (ref 0.57–1.00)
Globulin, Total: 2 g/dL (ref 1.5–4.5)
Glucose: 89 mg/dL (ref 70–99)
Potassium: 5.3 mmol/L — ABNORMAL HIGH (ref 3.5–5.2)
Sodium: 134 mmol/L (ref 134–144)
Total Protein: 6.3 g/dL (ref 6.0–8.5)
eGFR: 91 mL/min/{1.73_m2} (ref >59)

## 2023-09-29 LAB — TSH: TSH: 5.1 u[IU]/mL — ABNORMAL HIGH (ref 0.450–4.500)

## 2023-09-29 LAB — T4, FREE: Free T4: 1.55 ng/dL (ref 0.82–1.77)

## 2023-10-02 ENCOUNTER — Encounter: Payer: Self-pay | Admitting: "Endocrinology

## 2023-10-02 ENCOUNTER — Other Ambulatory Visit (HOSPITAL_COMMUNITY): Payer: Self-pay

## 2023-10-02 ENCOUNTER — Ambulatory Visit (INDEPENDENT_AMBULATORY_CARE_PROVIDER_SITE_OTHER): Payer: Medicare Other | Admitting: "Endocrinology

## 2023-10-02 VITALS — BP 134/82 | HR 64 | Ht 65.0 in | Wt 155.0 lb

## 2023-10-02 DIAGNOSIS — M8589 Other specified disorders of bone density and structure, multiple sites: Secondary | ICD-10-CM

## 2023-10-02 DIAGNOSIS — E038 Other specified hypothyroidism: Secondary | ICD-10-CM

## 2023-10-02 DIAGNOSIS — R7303 Prediabetes: Secondary | ICD-10-CM | POA: Diagnosis not present

## 2023-10-02 DIAGNOSIS — I10 Essential (primary) hypertension: Secondary | ICD-10-CM | POA: Diagnosis not present

## 2023-10-02 DIAGNOSIS — E782 Mixed hyperlipidemia: Secondary | ICD-10-CM

## 2023-10-02 MED ORDER — TIROSINT 50 MCG PO CAPS
50.0000 ug | ORAL_CAPSULE | Freq: Every day | ORAL | 1 refills | Status: DC
  Filled 2023-10-02: qty 90, 90d supply, fill #0
  Filled 2023-10-09: qty 30, 30d supply, fill #0
  Filled 2023-11-08: qty 30, 30d supply, fill #1
  Filled 2023-11-08: qty 90, 90d supply, fill #1
  Filled 2024-01-05: qty 30, 30d supply, fill #2
  Filled 2024-02-03: qty 30, 30d supply, fill #3
  Filled 2024-03-05: qty 30, 30d supply, fill #4
  Filled 2024-04-08: qty 30, 30d supply, fill #5

## 2023-10-02 NOTE — Progress Notes (Signed)
 10/02/2023        Endocrinology Follow Up Visit   Subjective:    Patient ID: Julie May, female    DOB: Mar 08, 1951, PCP Kerri Perches, MD   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 Elie Confer Spine   TONSILECTOMY, ADENOIDECTOMY, BILATERAL MYRINGOTOMY AND TUBES     TONSILLECTOMY     uterine polyp removal      Social History   Socioeconomic History   Marital status: Married    Spouse name: Lorella Nimrod   Number of children: 0   Years of education: Not on file   Highest education level: Bachelor's degree (e.g., BA, AB, BS)  Occupational History   Occupation: Conservator, museum/gallery   Tobacco Use   Smoking status: Never   Smokeless tobacco: Never  Vaping Use   Vaping status: Never Used  Substance and Sexual Activity   Alcohol use: Not Currently    Alcohol/week: 1.0 standard drink of alcohol    Types: 1 Glasses of wine per week    Comment: occasional beer   Drug use: No   Sexual activity: Yes    Birth control/protection: Post-menopausal  Other Topics Concern   Not on file  Social History Narrative   Married x 36 years 2022.         Are you right handed or left handed? Right Handed    Are you currently  employed ? No   What is your current occupation? Retired    Do you live at home alone? No    Who lives with you? With husband    What type of home do you live in: 1 story or 2 story? Lives in a one story home with a basement        Social Drivers of Health   Financial Resource Strain: Low Risk  (08/02/2023)   Overall Financial Resource Strain (CARDIA)    Difficulty of Paying Living Expenses: Not hard at all  Food Insecurity: No Food Insecurity (08/02/2023)   Hunger Vital Sign    Worried About Running Out of Food in the Last Year: Never true    Ran Out of Food in the Last Year: Never true  Transportation Needs: No Transportation Needs (08/02/2023)   PRAPARE - Administrator, Civil Service (Medical): No    Lack of Transportation (Non-Medical): No  Physical Activity: Sufficiently Active (08/02/2023)   Exercise Vital Sign    Days of Exercise per Week: 7 days    Minutes of Exercise per Session: 30 min  Stress: No Stress Concern Present (08/02/2023)   [employer] of Occupational Health - Occupational  Stress Questionnaire    Feeling of Stress : Not at all  Social Connections: Socially Integrated (08/02/2023)   Social Connection and Isolation Panel [NHANES]    Frequency of Communication with Friends and Family: More than three times a week    Frequency of Social Gatherings with Friends and Family: More than three times a week    Attends Religious Services: More than 4 times per year    Active Member of [employer] or Organizations: Yes    Attends Banker Meetings: More than 4 times per year    Marital Status: Married   Outpatient Encounter Medications as of 10/02/2023  Medication Sig   rosuvastatin (CRESTOR) 5 MG tablet Take 5 mg by mouth at bedtime.   Cholecalciferol (VITAMIN D) 2000 units CAPS Take 2,000 Units by mouth daily with lunch.    losartan (COZAAR) 25 MG tablet TAKE 1 TABLET(25 MG) BY MOUTH DAILY   TIROSINT 50 MCG CAPS Take 1 capsule (50 mcg total) by  mouth daily before breakfast.   [DISCONTINUED] Levothyroxine Sodium (TIROSINT) 50 MCG CAPS Take 1 capsule (50 mcg total) by mouth daily before breakfast.   [DISCONTINUED] TIROSINT 50 MCG CAPS Take 1 capsule (50 mcg total) by mouth daily before breakfast.  (Plan rejected 100 day supply and limited to 30 day)   No facility-administered encounter medications on file as of 10/02/2023.   ALLERGIES: No Known Allergies VACCINATION STATUS: Immunization History  Administered Date(s) Administered   Fluad Quad(high Dose 65+) 04/10/2019, 04/13/2020, 04/20/2021, 04/27/2022   Fluad Trivalent(High Dose 65+) 05/04/2023   Influenza Split 06/22/2011   Influenza Whole 05/18/2006, 04/30/2007, 04/14/2008   Influenza,inj,Quad PF,6+ Mos 05/31/2013, 05/28/2014, 04/14/2015, 04/27/2016, 05/16/2017, 04/13/2018   Moderna SARS-COV2 Booster Vaccination 04/29/2021   Moderna Sars-Covid-2 Vaccination 08/23/2019, 09/20/2019, 05/23/2020, 10/30/2020   Pneumococcal Conjugate-13 09/14/2018   Td 08/13/2009   Tdap 10/27/2022   Zoster Recombinant(Shingrix) 03/30/2021, 08/12/2021   Zoster, Live 03/23/2011    Thyroid Problem Presents for follow-up visit. Symptoms include tremors. Patient reports no anxiety, cold intolerance, constipation, depressed mood, diarrhea, fatigue, heat intolerance, leg swelling, palpitations, weight gain or weight loss. The symptoms have been stable.  Hyperlipidemia This is a chronic problem. The current episode started more than 1 year ago. The problem is uncontrolled. She is currently on no antihyperlipidemic treatment. Risk factors for coronary artery disease include dyslipidemia and hypertension.   Hypertension:  She is  on treatment.  Review of systems  Constitutional: + Minimally fluctuating body weight.,  current Body mass index is 25.79 kg/m. , no fatigue, no subjective hyperthermia, no subjective hypothermia    Objective:    BP 134/82   Pulse 64   Ht 5\' 5"  (1.651 m)   Wt 155 lb  (70.3 kg)   BMI 25.79 kg/m   Wt Readings from Last 3 Encounters:  10/02/23 155 lb (70.3 kg)  08/04/23 154 lb (69.9 kg)  08/02/23 150 lb (68 kg)    BP Readings from Last 3 Encounters:  10/02/23 134/82  08/04/23 116/80  08/02/23 (!) 96/51    Physical Exam- Limited  Constitutional:  Body mass index is 25.79 kg/m. , not in acute distress, anxious state of mind Eyes:  EOMI, no exophthalmos Neck: Supple, palpable thyroid.  CMP     Component Value Date/Time   NA 134 09/28/2023 1043   K 5.3 (H) 09/28/2023 1043   CL 96 09/28/2023 1043   CO2 24 09/28/2023 1043   GLUCOSE 89 09/28/2023 1043   GLUCOSE 99 10/30/2022 0535  BUN 11 09/28/2023 1043   CREATININE 0.70 09/28/2023 1043   CREATININE 0.80 05/11/2020 0000   CALCIUM 9.6 09/28/2023 1043   PROT 6.3 09/28/2023 1043   ALBUMIN 4.3 09/28/2023 1043   AST 23 09/28/2023 1043   ALT 18 09/28/2023 1043   ALKPHOS 98 09/28/2023 1043   BILITOT 0.5 09/28/2023 1043   GFRNONAA >60 10/30/2022 0535   GFRNONAA 75 05/11/2020 0000   GFRAA 87 05/11/2020 0000     Diabetic Labs (most recent): Lab Results  Component Value Date   HGBA1C 5.6 (A) 09/29/2022   HGBA1C 5.5 (A) 06/25/2021   HGBA1C 5.7 (H) 05/11/2020      Lipid Panel     Component Value Date/Time   CHOL 149 09/28/2023 1043   TRIG 44 09/28/2023 1043   HDL 79 09/28/2023 1043   CHOLHDL 1.9 09/28/2023 1043   CHOLHDL 2.5 05/11/2020 0000   VLDL 10 04/03/2019 0934   LDLCALC 60 09/28/2023 1043   LDLCALC 110 (H) 05/11/2020 0000   Recent Results (from the past 2160 hours)  TSH     Status: Abnormal   Collection Time: 09/28/23 10:43 AM  Result Value Ref Range   TSH 5.100 (H) 0.450 - 4.500 uIU/mL  T4, free     Status: None   Collection Time: 09/28/23 10:43 AM  Result Value Ref Range   Free T4 1.55 0.82 - 1.77 ng/dL  Lipid panel     Status: None   Collection Time: 09/28/23 10:43 AM  Result Value Ref Range   Cholesterol, Total 149 100 - 199 mg/dL   Triglycerides 44 0 - 149  mg/dL   HDL 79 >65 mg/dL   VLDL Cholesterol Cal 10 5 - 40 mg/dL   LDL Chol Calc (NIH) 60 0 - 99 mg/dL   Chol/HDL Ratio 1.9 0.0 - 4.4 ratio    Comment:                                   T. Chol/HDL Ratio                                             Men  Women                               1/2 Avg.Risk  3.4    3.3                                   Avg.Risk  5.0    4.4                                2X Avg.Risk  9.6    7.1                                3X Avg.Risk 23.4   11.0   Comprehensive metabolic panel     Status: Abnormal   Collection Time: 09/28/23 10:43 AM  Result Value Ref Range   Glucose 89 70 - 99 mg/dL   BUN 11 8 - 27 mg/dL   Creatinine, Ser  0.70 0.57 - 1.00 mg/dL   eGFR 91 >96 EA/VWU/9.81   BUN/Creatinine Ratio 16 12 - 28   Sodium 134 134 - 144 mmol/L   Potassium 5.3 (H) 3.5 - 5.2 mmol/L   Chloride 96 96 - 106 mmol/L   CO2 24 20 - 29 mmol/L   Calcium 9.6 8.7 - 10.3 mg/dL   Total Protein 6.3 6.0 - 8.5 g/dL   Albumin 4.3 3.8 - 4.8 g/dL   Globulin, Total 2.0 1.5 - 4.5 g/dL   Bilirubin Total 0.5 0.0 - 1.2 mg/dL   Alkaline Phosphatase 98 44 - 121 IU/L   AST 23 0 - 40 IU/L   ALT 18 0 - 32 IU/L     Assessment & Plan:   1.  Primary hypothyroidism -Her previsit thyroid function tests are discordant showing target free T4, however still slightly above target TSH.  She wishes to stay on her current dose of Tirosint and that is acceptable.  She is advised to continue Tirosint 50 mcg p.o. daily before breakfast.     - We discussed about the correct intake of her thyroid hormone, on empty stomach at fasting, with water, separated by at least 30 minutes from breakfast and other medications,  and separated by more than 4 hours from calcium, iron, multivitamins, acid reflux medications (PPIs). -Patient is made aware of the fact that thyroid hormone replacement is needed for life, dose to be adjusted by periodic monitoring of thyroid function tests.   -She did not tolerate  levothyroxine or Synthroid in the past.    2. Nontoxic multinodular goiter Her last ultrasound from 2017 was unchanged from previous studies, showing no significant findings.  More recent imaging showed similar findings in 2022.  No further thyroid imaging is necessary.    3.  Hypertension -She is responding to her blood pressure management with losartan 25 mg p.o. daily at breakfast.  She is advised to continue her current regimen.     4) Hyperlipidemia: she did not engage with lifestyle medicine optimally.  She is responding to low-dose Crestor with improvement in her lipid panel, LDL at 60.  She is advised to continue Crestor 5 mg every other day nightly.     5) Prediabetes- resolved with ac of 5.6%.  She will not need medication intervention for this at this time.  Lifestyle medicine nutrition described above will help her avoid progression to type 2 diabetes.  6) osteopenia: She would not need intervention with bisphosphonates for now.  She is advised to maintain adequate calcium and vitamin D.   - I advised patient to maintain close follow up with Kerri Perches, MD for primary care needs.   I spent  25  minutes in the care of the patient today including review of labs from Thyroid Function, CMP, and other relevant labs ; imaging/biopsy records (current and previous including abstractions from other facilities); face-to-face time discussing  her lab results and symptoms, medications doses, her options of short and long term treatment based on the latest standards of care / guidelines;   and documenting the encounter.  [name]  participated in the discussions, expressed understanding, and voiced agreement with the above plans.  All questions were answered to her satisfaction. she is encouraged to contact clinic should she have any questions or concerns prior to her return visit.   Follow up plan: Return in about 6 months (around 04/03/2024) for Fasting Labs  in AM B4  8.  Ronny Bacon, FNP-BC [facility] [address] [place], [place] [address] Phone: [phone] Fax: [phone]  10/02/2023, 11:23 AM

## 2023-10-03 ENCOUNTER — Ambulatory Visit (HOSPITAL_COMMUNITY): Payer: Medicare Other

## 2023-10-03 ENCOUNTER — Other Ambulatory Visit (HOSPITAL_COMMUNITY): Payer: Self-pay

## 2023-10-03 ENCOUNTER — Encounter (HOSPITAL_COMMUNITY): Payer: Medicare Other

## 2023-10-04 ENCOUNTER — Ambulatory Visit: Payer: Medicare Other

## 2023-10-05 ENCOUNTER — Encounter (HOSPITAL_COMMUNITY): Payer: Self-pay

## 2023-10-05 ENCOUNTER — Ambulatory Visit (HOSPITAL_COMMUNITY)
Admission: RE | Admit: 2023-10-05 | Discharge: 2023-10-05 | Disposition: A | Discharge status: Home / Self Care | Source: Ambulatory Visit | Attending: Family Medicine | Admitting: Family Medicine

## 2023-10-05 DIAGNOSIS — N6324 Unspecified lump in the left breast, lower inner quadrant: Secondary | ICD-10-CM | POA: Diagnosis not present

## 2023-10-05 DIAGNOSIS — N63 Unspecified lump in unspecified breast: Secondary | ICD-10-CM

## 2023-10-05 DIAGNOSIS — R92313 Mammographic fatty tissue density, bilateral breasts: Secondary | ICD-10-CM | POA: Diagnosis not present

## 2023-10-05 DIAGNOSIS — R928 Other abnormal and inconclusive findings on diagnostic imaging of breast: Secondary | ICD-10-CM | POA: Diagnosis not present

## 2023-10-06 ENCOUNTER — Ambulatory Visit (HOSPITAL_COMMUNITY): Attending: Neurology

## 2023-10-06 ENCOUNTER — Encounter (HOSPITAL_COMMUNITY): Payer: Self-pay

## 2023-10-06 DIAGNOSIS — M6281 Muscle weakness (generalized): Secondary | ICD-10-CM | POA: Insufficient documentation

## 2023-10-06 DIAGNOSIS — R262 Difficulty in walking, not elsewhere classified: Secondary | ICD-10-CM | POA: Diagnosis not present

## 2023-10-06 NOTE — Therapy (Signed)
 OUTPATIENT PHYSICAL THERAPY LOWER EXTREMITY TREATMENT   Patient Name: Julie May MRN: [id number] DOB:Jul 10, 1951, 73 y.o., female Today's Date: 10/06/2023  END OF SESSION:  PT End of Session - 10/06/23 1439     Visit Number 3    Number of Visits 5    Date for PT Re-Evaluation 11/17/23    Authorization Type Medicare Part A & B    Progress Note Due on Visit 10    PT Start Time 1346    PT Stop Time 1425    PT Time Calculation (min) 39 min    Equipment Utilized During Treatment Gait belt    Activity Tolerance Patient tolerated treatment well    Behavior During Therapy WFL for tasks assessed/performed              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 Elie Confer Spine   TONSILECTOMY, ADENOIDECTOMY, BILATERAL MYRINGOTOMY AND TUBES     TONSILLECTOMY     uterine polyp removal      Patient Active Problem List   Diagnosis Date Noted   Impacted cerumen of both ears 08/04/2023   Left foot drop 05/07/2023   Encounter for immunization 05/07/2023   Low vitamin D level 05/07/2023   History of UTI 04/30/2023   Elevated LFTs 11/02/2022   Cat bite of forearm 10/29/2022   Spondylolisthesis of lumbar  region 01/28/2020   Callus of foot 11/07/2017   Prediabetes 08/15/2017   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   Osteopenia 04/04/2015   Right foot pain 05/23/2013   Essential hypertension, benign 11/12/2010   Hypothyroidism 01/18/2010   Mixed hyperlipidemia 12/17/2009    PCP: Kerri Perches, MD  REFERRING PROVIDER: Glendale Chard, DO  REFERRING DIAG: M21.371,M21.372 (ICD-10-CM) - Bilateral foot-drop  THERAPY DIAG:  Difficulty in walking, not elsewhere classified  Muscle weakness of lower extremity  Rationale for Evaluation and Treatment: Rehabilitation  ONSET DATE: Years ago  SUBJECTIVE:   SUBJECTIVE STATEMENT: Pt back from Neurosurgery, from previous therapist wanted clearance regarding fracture. Pt gave note from Neurosurgeon that she is cleared to return to PT. Pt's MD thinks the bilateral foot drop is from peripheral neuropathy. Pt has CMT. No pain currently.,    EVAL: Arrives to the clinic due to foot drop and and walking outside her feet. Patient is also concerned about her balance. Denies any numbness. Condition with the foot drop began when she had spinal stenosis. Patient then had back surgery in July 2021 which fixed the foot drop. Recently, 3-4 months ago, the issues on her feet were back. Patient also attributes her issues with CMTD as what her neurologist  has told her. Patient was then referred to outpatient PT evaluation and management.  PERTINENT HISTORY: CMTD, PLIF PAIN:  Are you having pain? No  PRECAUTIONS: Fall  RED FLAGS: None   WEIGHT BEARING RESTRICTIONS: No  FALLS:  Has patient fallen in last 6 months? Yes. Number of falls 1 (6 weeks to 2 months ago)  LIVING ENVIRONMENT: Lives with: lives with their spouse Lives in: House/apartment Stairs: No Has following equipment at home: Environmental consultant - 2 wheeled  OCCUPATION: retired  PLOF: Independent and Independent with basic ADLs  PATIENT GOALS: "to strengthen my legs and feet"  NEXT MD  VISIT: February 2025  OBJECTIVE:  Note: Objective measures were completed at Evaluation unless otherwise noted.  DIAGNOSTIC FINDINGS:  06/27/23 MRI LUMBAR SPINE WITHOUT CONTRAST   TECHNIQUE: Multiplanar, multisequence MR imaging of the lumbar spine was performed. No intravenous contrast was administered.   COMPARISON:  Lumbar spine radiograph 01/28/2020 and 03/01/2021. Lumbar MRI 01/02/2020.   FINDINGS: Segmentation: Transitional lumbosacral anatomy. In keeping with the numbering applied to the previous MRI, there is a transitional, partially lumbarized S1 segment. This numbering assumes interval L4-5 PLIF and bilateral ribs at the L1 level.   Alignment: Stable grade 1 fused anterolisthesis at L4-5 and similar grade 1 anterolisthesis at L5-S1.   Vertebrae: No worrisome osseous lesion, acute fracture or pars defect within the lumbar spine. Sagittal images demonstrate marrow edema and a fracture of the mid to distal sacrum asymmetric to the left. Interval L4-5 posterior decompression and PLIF.   Conus medullaris: Extends to the L2 level and appears normal.   Paraspinal and other soft tissues: No significant paraspinal findings. Mild fatty atrophy within the erector spinae musculature inferiorly.   Disc levels:   Sagittal images demonstrate mild lower thoracic spondylosis without evidence of significant spinal stenosis or foraminal narrowing.   L1-2: Mild loss of disc height with a new small right paracentral disc extrusion and superior migration of a small disc fragment. No resulting mass effect on the distal thoracic cord or foraminal narrowing.   L2-3: Preserved disc height with stable mild disc bulging and a small central annular fissure. Mild bilateral facet hypertrophy. No significant spinal stenosis or nerve root encroachment.   L3-4: Adjacent segment changes with mildly progressive loss of disc height, annular disc bulging and moderate facet and  ligamentous hypertrophy. Resulting progressive multifactorial spinal stenosis, now moderate. In addition, there is moderate lateral recess and mild foraminal narrowing bilaterally.   L4-5: Interval posterior decompression and PLIF. The spinal canal and lateral recesses are well decompressed. Mild residual inferior foraminal narrowing bilaterally due to spur without L4 nerve root encroachment.   L5-S1: Preserved disc height with mild disc bulging and moderate facet hypertrophy. No spinal stenosis or nerve root encroachment.   IMPRESSION: 1. Transitional lumbosacral anatomy. In keeping with the numbering applied to the previous MRI, there is a transitional, partially lumbarized S1 segment. This numbering assumes interval L4-5 PLIF and bilateral ribs at the L1 level. 2. Acute fracture of the mid to distal sacrum asymmetric to the left. 3. Interval posterior decompression and PLIF at L4-5 with well decompressed spinal canal and lateral recesses. Mild residual inferior foraminal narrowing bilaterally without L4 nerve root encroachment. 4. Adjacent segment changes at L3-4 with progressive multifactorial spinal stenosis, now moderate. In addition, there is moderate lateral recess and mild foraminal narrowing bilaterally. 5. New small right paracentral disc extrusion and superior migration of a small disc fragment at L1-2 without resulting mass effect on the distal thoracic cord  or foraminal narrowing. 6. No other significant changes or nerve root encroachment.  PATIENT SURVEYS:  ABC scale 1280/1800 = 80%  COGNITION: Overall cognitive status: Within functional limits for tasks assessed     SENSATION: Light touch: WFL on B LE  MUSCLE LENGTH: Hamstrings: mild restriction on B Thomas test: moderate restriction on B Gastrocsoleus: moderate to severe restriction on B  POSTURE: rounded shoulders, forward head, increased thoracic kyphosis, and highly supinated feet, very slight genu  varus  LOWER EXTREMITY ROM:  Active ROM Right eval Left eval Right 10/06/2023 Left 10/06/2023  Hip flexion [facility]    Hip extension [facility]    Hip abduction [facility]    Hip adduction      Hip internal rotation      Hip external rotation      Knee flexion [facility]    Knee extension [facility] WFL    Ankle dorsiflexion 50-30 50-40 50-30 50-40  Ankle plantarflexion 50-56 50-56 50-56 50-56  Ankle inversion 20-50 10-40 20-50 10-40  Ankle eversion 20-10 10-8 20-10 10-8   (Blank rows = not tested)  LOWER EXTREMITY MMT:  MMT Right eval Left eval Right 10/06/2023 Left 10/06/2023  Hip flexion 4- 4-    Hip extension 4- 4-    Hip abduction 4- 4-    Hip adduction      Hip internal rotation      Hip external rotation      Knee flexion 4 4    Knee extension 4 4    Ankle dorsiflexion 2+ 2-    Ankle plantarflexion 3+ 3+    Ankle inversion 4 4    Ankle eversion 2- 2-     (Blank rows = not tested)   FUNCTIONAL TESTS:  5 times sit to stand: 9.59 sec 2 minute walk test: 349 ft Dynamic Gait Index: 14  DGI 07/26/23 1. Gait level surface (1) Moderate Impairment: Walks 20', slow speed, abnormal gait pattern, evidence for imbalance. 2. Change in gait speed (2) Mild Impairment: Is able to change speed but demonstrates mild gait deviations, or not gait deviations but unable to achieve a significant change in velocity, or uses an assistive device. 3. Gait with horizontal head turns (2) Mild Impairment: Performs head turns smoothly with slight change in gait velocity, i.e., minor disruption to smooth gait path or uses walking aid. 4. Gait with vertical head turns (2) Mild Impairment: Performs head turns smoothly with slight change in gait velocity, i.e., minor disruption to smooth gait path or uses walking aid. 5. Gait and pivot turn (2) Mild Impairment: Pivot turns safely in > 3 seconds and stops with no loss of balance. 6. Step over obstacle (1) Moderate Impairment: Is able to step over box but  must stop, then step over. May require verbal cueing. 7. Step around obstacles (2) Mild Impairment: Is able to step around both cones, but must slow down and adjust steps to clear cones. 8. Stairs (2) Mild Impairment: Alternating feet, must use rail.  TOTAL SCORE: 14 / 24  GAIT: Distance walked: 349 ft Assistive device utilized: None Level of assistance: Complete Independence Comments: done during , patient demonstrates foot slap, excessive hip flexion on preswing, inadequate hip ext on terminal swing  TREATMENT DATE:  10/06/2023  -DGI 1. Gait level surface (2) Mild Impairment: Walks 20', uses assistive devices, slower speed, mild gait deviations. 2. Change in gait speed (2) Mild Impairment: Is able to change speed but demonstrates mild gait deviations, or not gait deviations but unable to achieve a significant change in velocity, or uses an assistive device. 3. Gait with horizontal head turns (2) Mild Impairment: Performs head turns smoothly with slight change in gait velocity, i.e., minor disruption to smooth gait path or uses walking aid. 4. Gait with vertical head turns (2) Mild Impairment: Performs head turns smoothly with slight change in gait velocity, i.e., minor disruption to smooth gait path or uses walking aid. 5. Gait and pivot turn (2) Mild Impairment: Pivot turns safely in > 3 seconds and stops with no loss of balance. 6. Step over obstacle (1) Moderate Impairment: Is able to step over box but must stop, then step over. May require verbal cueing. 7. Step around obstacles (2) Mild Impairment: Is able to step around both cones, but must slow down and adjust steps to clear cones. 8. Stairs (1) Moderate Impairment: Two feet to a stair, must use rail.  TOTAL SCORE: 14 / 24 - -5x STS: 9 seconcds -ROM -MMT  08/11/23 Seated calf stretch x  30" x 3 on each  Toe yoga x 10 x 2 Ankle eversion on a towel x 10 x 2 Seated hip adduction with ball x 3" x 10 x 2 Education on foot arches and arch support  07/26/23 Evaluation and patient education done    PATIENT EDUCATION:  Education details: Educated on the pathoanatomy of pes cavus and foot drop. Educated on the goals and course of rehab. Educated on measures to reduce falls at home Person educated: Patient Education method: Explanation Education comprehension: verbalized understanding  HOME EXERCISE PROGRAM: Access Code: [email or web address] URL: [email or web address] Date: 08/11/2023 Prepared by: Krystal Clark  Exercises - Seated Calf Stretch with Strap  - 1-2 x daily - 7 x weekly - 3 sets - 3 reps - 30 hold - Toe Yoga - Alternating Great Toe and Lesser Toe Extension  - 1-2 x daily - 7 x weekly - 2 sets - 10 reps - Ankle Inversion Eversion Towel Slide  - 1 x daily - 7 x weekly - 2 sets - 10 reps - Seated Hip Adduction Isometrics with Ball  - 1-2 x daily - 7 x weekly - 3 sets - 10 reps - 3 hold  ASSESSMENT:  CLINICAL IMPRESSION: Progress note completed today. Pt continuing to show same impairments from evaluation with no change due to no completed treatment. Pt given clearance for sacral fracture, with no concerns for PT treatment. Pt continues to show abnormalities due to ankle and BLE weakness and ROM deficits in setting of CMT. Pt's frequency and duration extended 2x/6 weeks due to continued funcitonal deficits and rehabilitation prognosis is greater with more time to facilitate potential orthotic intervention for proper foot alignment to reduce fall risk. . To date, skilled PT is required to address the impairments and improve function.  EVAL: Patient is a 73 y.o. female who was seen today for physical therapy evaluation and treatment for B foot drop. Patient's condition is further defined by difficulty with walking due to weakness, impaired proprioception and decreased  soft tissue extensibility. Skilled PT is required to address the impairments and functional limitations listed below. Notified PCP via Epic about findings of the MRI.    OBJECTIVE IMPAIRMENTS: Abnormal gait, decreased activity tolerance,  decreased balance, difficulty walking, decreased ROM, decreased strength, impaired flexibility, and postural dysfunction.   ACTIVITY LIMITATIONS: standing, squatting, stairs, and transfers  PARTICIPATION LIMITATIONS: meal prep, cleaning, laundry, driving, shopping, community activity, and yard work  PERSONAL FACTORS: Age, Time since onset of injury/illness/exacerbation, and 1-2 comorbidities: CMTD, PLIF  are also affecting patient's functional outcome.   REHAB POTENTIAL: Fair    CLINICAL DECISION MAKING: Evolving/moderate complexity  EVALUATION COMPLEXITY: Moderate   GOALS: Goals reviewed with patient? Yes  SHORT TERM GOALS: Target date: 08/09/23 Pt will demonstrate indep in HEP to facilitate carry-over of skilled services and improve functional outcomes Goal status: INITIAL  LONG TERM GOALS: Target date: 08/23/23  Pt will improve DGI by at least 3 points in order to demonstrate clinically significant improvement in balance and decreased risk for falls Baseline: 14 Goal status: INITIAL  2.  Pt will increase by at least 40 ft in order to demonstrate clinically significant improvement in community ambulation Baseline: 349 ft Goal status: INITIAL  3.  Pt will demonstrate increase in ankle ROM by 5-10 deg  to facilitate ease in ADLs  and ambulation Baseline: see above Goal status: INITIAL  4.  Pt will demonstrate increase in LE strength to 3+/5 to facilitate ease and safety in ambulation Baseline: 2-/5 Goal status: INITIAL  PLAN:  PT FREQUENCY: 2x/week  PT DURATION: 4 weeks  PLANNED INTERVENTIONS: 97164- PT Re-evaluation, 97110-Therapeutic exercises, 97530- Therapeutic activity, 97112- Neuromuscular re-education, 97535- Self Care,  97140- Manual therapy, [id number]- Gait training, 91478- Orthotic Fit/training, 29562- Electrical stimulation (unattended), [phone]- Electrical stimulation (manual), and Patient/Family education  PLAN FOR NEXT SESSION: Provide HEP. Begin LE flexibility and strengthening. May introduce electrical stimulation for ankle evertors.   Nelida Meuse PT, DPT [facility] [place] [phone] office 10/06/2023, 2:43 PM

## 2023-10-09 ENCOUNTER — Encounter (HOSPITAL_COMMUNITY): Payer: Self-pay

## 2023-10-09 ENCOUNTER — Ambulatory Visit (HOSPITAL_COMMUNITY)

## 2023-10-09 DIAGNOSIS — R262 Difficulty in walking, not elsewhere classified: Secondary | ICD-10-CM

## 2023-10-09 DIAGNOSIS — M6281 Muscle weakness (generalized): Secondary | ICD-10-CM

## 2023-10-09 NOTE — Therapy (Signed)
 OUTPATIENT PHYSICAL THERAPY LOWER EXTREMITY TREATMENT   Patient Name: Julie May MRN: [id number] DOB:1951/01/02, 73 y.o., female Today's Date: 10/09/2023  END OF SESSION:  PT End of Session - 10/09/23 1247     Visit Number 4    Number of Visits 5    Date for PT Re-Evaluation 11/17/23    Authorization Type Medicare Part A & B    Progress Note Due on Visit 10    PT Start Time 1017    PT Stop Time 1059    PT Time Calculation (min) 42 min    Equipment Utilized During Treatment Gait belt    Activity Tolerance Patient tolerated treatment well    Behavior During Therapy WFL for tasks assessed/performed               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 Elie Confer Spine   TONSILECTOMY, ADENOIDECTOMY, BILATERAL MYRINGOTOMY AND TUBES     TONSILLECTOMY     uterine polyp removal      Patient Active Problem List   Diagnosis Date Noted   Impacted cerumen of both ears 08/04/2023   Left foot drop 05/07/2023   Encounter for immunization 05/07/2023   Low vitamin D level 05/07/2023   History of UTI 04/30/2023   Elevated LFTs 11/02/2022   Cat bite of forearm 10/29/2022   Spondylolisthesis of lumbar  region 01/28/2020   Callus of foot 11/07/2017   Prediabetes 08/15/2017   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   Osteopenia 04/04/2015   Right foot pain 05/23/2013   Essential hypertension, benign 11/12/2010   Hypothyroidism 01/18/2010   Mixed hyperlipidemia 12/17/2009    PCP: Kerri Perches, MD  REFERRING PROVIDER: Glendale Chard, DO  REFERRING DIAG: M21.371,M21.372 (ICD-10-CM) - Bilateral foot-drop  THERAPY DIAG:  Difficulty in walking, not elsewhere classified  Muscle weakness of lower extremity  Rationale for Evaluation and Treatment: Rehabilitation  ONSET DATE: Years ago  SUBJECTIVE:   SUBJECTIVE STATEMENT: Pt reports doc said to avoid bridges and prone on elbows exercises. Pt states she feels awkward while she walks and still has some moments of imbalance but no falls.   EVAL: Arrives to the clinic due to foot drop and and walking outside her feet. Patient is also concerned about her balance. Denies any numbness. Condition with the foot drop began when she had spinal stenosis. Patient then had back surgery in July 2021 which fixed the foot drop. Recently, 3-4 months ago, the issues on her feet were back. 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 Patient was then referred to outpatient PT evaluation  and management.  PERTINENT HISTORY: CMTD, PLIF PAIN:  Are you having pain? No  PRECAUTIONS: Fall  RED FLAGS: None   WEIGHT BEARING RESTRICTIONS: No  FALLS:  Has patient fallen in last 6 months? Yes. Number of falls 1 (6 weeks to 2 months ago)  LIVING ENVIRONMENT: Lives with: lives with their spouse Lives in: House/apartment Stairs: No Has following equipment at home: Environmental consultant - 2 wheeled  OCCUPATION: retired  PLOF: Independent and Independent with basic ADLs  PATIENT GOALS: "to strengthen my legs and feet"  NEXT MD VISIT: February 2025  OBJECTIVE:  Note: Objective measures were completed at Evaluation unless  otherwise noted.  DIAGNOSTIC FINDINGS:  06/27/23 MRI LUMBAR SPINE WITHOUT CONTRAST   TECHNIQUE: Multiplanar, multisequence MR imaging of the lumbar spine was performed. No intravenous contrast was administered.   COMPARISON:  Lumbar spine radiograph 01/28/2020 and 03/01/2021. Lumbar MRI 01/02/2020.   FINDINGS: Segmentation: Transitional lumbosacral anatomy. In keeping with the numbering applied to the previous MRI, there is a transitional, partially lumbarized S1 segment. This numbering assumes interval L4-5 PLIF and bilateral ribs at the L1 level.   Alignment: Stable grade 1 fused anterolisthesis at L4-5 and similar grade 1 anterolisthesis at L5-S1.   Vertebrae: No worrisome osseous lesion, acute fracture or pars defect within the lumbar spine. Sagittal images demonstrate marrow edema and a fracture of the mid to distal sacrum asymmetric to the left. Interval L4-5 posterior decompression and PLIF.   Conus medullaris: Extends to the L2 level and appears normal.   Paraspinal and other soft tissues: No significant paraspinal findings. Mild fatty atrophy within the erector spinae musculature inferiorly.   Disc levels:   Sagittal images demonstrate mild lower thoracic spondylosis without evidence of significant spinal stenosis or foraminal narrowing.   L1-2: Mild loss of disc height with a new small right paracentral disc extrusion and superior migration of a small disc fragment. No resulting mass effect on the distal thoracic cord or foraminal narrowing.   L2-3: Preserved disc height with stable mild disc bulging and a small central annular fissure. Mild bilateral facet hypertrophy. No significant spinal stenosis or nerve root encroachment.   L3-4: Adjacent segment changes with mildly progressive loss of disc height, annular disc bulging and moderate facet and ligamentous hypertrophy. Resulting progressive multifactorial spinal stenosis, now moderate. In addition, there  is moderate lateral recess and mild foraminal narrowing bilaterally.   L4-5: Interval posterior decompression and PLIF. The spinal canal and lateral recesses are well decompressed. Mild residual inferior foraminal narrowing bilaterally due to spur without L4 nerve root encroachment.   L5-S1: Preserved disc height with mild disc bulging and moderate facet hypertrophy. No spinal stenosis or nerve root encroachment.   IMPRESSION: 1. Transitional lumbosacral anatomy. In keeping with the numbering applied to the previous MRI, there is a transitional, partially lumbarized S1 segment. This numbering assumes interval L4-5 PLIF and bilateral ribs at the L1 level. 2. Acute fracture of the mid to distal sacrum asymmetric to the left. 3. Interval posterior decompression and PLIF at L4-5 with well decompressed spinal canal and lateral recesses. Mild residual inferior foraminal narrowing bilaterally without L4 nerve root encroachment. 4. Adjacent segment changes at L3-4 with progressive multifactorial spinal stenosis, now moderate. In addition, there is moderate lateral recess and mild foraminal narrowing bilaterally. 5. New small right paracentral disc extrusion and superior migration of a small disc fragment at L1-2 without resulting mass effect on the distal thoracic cord or foraminal narrowing. 6. No other significant changes or nerve root  encroachment.  PATIENT SURVEYS:  ABC scale 1280/1800 = 80%  COGNITION: Overall cognitive status: Within functional limits for tasks assessed     SENSATION: Light touch: WFL on B LE  MUSCLE LENGTH: Hamstrings: mild restriction on B Thomas test: moderate restriction on B Gastrocsoleus: moderate to severe restriction on B  POSTURE: rounded shoulders, forward head, increased thoracic kyphosis, and highly supinated feet, very slight genu varus  LOWER EXTREMITY ROM:  Active ROM Right eval Left eval Right 10/06/2023 Left 10/06/2023  Hip flexion  [facility]    Hip extension [facility]    Hip abduction [facility]    Hip adduction      Hip internal rotation      Hip external rotation      Knee flexion [facility]    Knee extension [facility] WFL    Ankle dorsiflexion 50-30 50-40 50-30 50-40  Ankle plantarflexion 50-56 50-56 50-56 50-56  Ankle inversion 20-50 10-40 20-50 10-40  Ankle eversion 20-10 10-8 20-10 10-8   (Blank rows = not tested)  LOWER EXTREMITY MMT:  MMT Right eval Left eval Right 10/06/2023 Left 10/06/2023  Hip flexion 4- 4-    Hip extension 4- 4-    Hip abduction 4- 4-    Hip adduction      Hip internal rotation      Hip external rotation      Knee flexion 4 4    Knee extension 4 4    Ankle dorsiflexion 2+ 2-    Ankle plantarflexion 3+ 3+    Ankle inversion 4 4    Ankle eversion 2- 2-     (Blank rows = not tested)   FUNCTIONAL TESTS:  5 times sit to stand: 9.59 sec 2 minute walk test: 349 ft Dynamic Gait Index: 14  DGI 07/26/23 1. Gait level surface (1) Moderate Impairment: Walks 20', slow speed, abnormal gait pattern, evidence for imbalance. 2. Change in gait speed (2) Mild Impairment: Is able to change speed but demonstrates mild gait deviations, or not gait deviations but unable to achieve a significant change in velocity, or uses an assistive device. 3. Gait with horizontal head turns (2) Mild Impairment: Performs head turns smoothly with slight change in gait velocity, i.e., minor disruption to smooth gait path or uses walking aid. 4. Gait with vertical head turns (2) Mild Impairment: Performs head turns smoothly with slight change in gait velocity, i.e., minor disruption to smooth gait path or uses walking aid. 5. Gait and pivot turn (2) Mild Impairment: Pivot turns safely in > 3 seconds and stops with no loss of balance. 6. Step over obstacle (1) Moderate Impairment: Is able to step over box but must stop, then step over. May require verbal cueing. 7. Step around obstacles (2) Mild Impairment: Is able to  step around both cones, but must slow down and adjust steps to clear cones. 8. Stairs (2) Mild Impairment: Alternating feet, must use rail.  TOTAL SCORE: 14 / 24  GAIT: Distance walked: 349 ft Assistive device utilized: None Level of assistance: Complete Independence Comments: done during , patient demonstrates foot slap, excessive hip flexion on preswing, inadequate hip ext on terminal swing  TREATMENT DATE:  10/09/2023  Therapeutic Exercise: -Seated heel/toe raises, 2 sets of 10 bilateral -Red theraband eversion, 1 set of 10 reps bilateral, pt was not able to perform well due to weakness -3 way hip 1 set of 10 reps each side, BUE support needed for balance  Therapeutic Activity: -Step ups cueing for max glute extension, 1 set of 10 reps each side leading -STS with alternating stair taps, 2 sets of 8 reps, CGA with gait belt needed for safety  Neuromuscular Re-education: -SLS, 30 seconds bilateral, pt was not able to maintain balance without UE support every 2 seconds -NBOS, 30 second hold, Pt demonstrates knee shaking but no need for UE support for this activity -Semi-tandem, 30 second hold, bilateral, Pt demonstrates occasional need for UE support for this activity -Tandem stance, 30 second hold, bilateral, Pt demonstrates occasional need for UE support for this activity   10/06/2023  -DGI 1. Gait level surface (2) Mild Impairment: Walks 20', uses assistive devices, slower speed, mild gait deviations. 2. Change in gait speed (2) Mild Impairment: Is able to change speed but demonstrates mild gait deviations, or not gait deviations but unable to achieve a significant change in velocity, or uses an assistive device. 3. Gait with horizontal head turns (2) Mild Impairment: Performs head turns smoothly with slight change in gait velocity, i.e., minor  disruption to smooth gait path or uses walking aid. 4. Gait with vertical head turns (2) Mild Impairment: Performs head turns smoothly with slight change in gait velocity, i.e., minor disruption to smooth gait path or uses walking aid. 5. Gait and pivot turn (2) Mild Impairment: Pivot turns safely in > 3 seconds and stops with no loss of balance. 6. Step over obstacle (1) Moderate Impairment: Is able to step over box but must stop, then step over. May require verbal cueing. 7. Step around obstacles (2) Mild Impairment: Is able to step around both cones, but must slow down and adjust steps to clear cones. 8. Stairs (1) Moderate Impairment: Two feet to a stair, must use rail.  TOTAL SCORE: 14 / 24 - -5x STS: 9 seconcds -ROM -MMT  08/11/23 Seated calf stretch x 30" x 3 on each  Toe yoga x 10 x 2 Ankle eversion on a towel x 10 x 2 Seated hip adduction with ball x 3" x 10 x 2 Education on foot arches and arch support  07/26/23 Evaluation and patient education done    PATIENT EDUCATION:  Education details: Educated on the pathoanatomy of pes cavus and foot drop. Educated on the goals and course of rehab. Educated on measures to reduce falls at home Person educated: Patient Education method: Explanation Education comprehension: verbalized understanding  HOME EXERCISE PROGRAM: Access Code: [email or web address] URL: [email or web address] Date: 08/11/2023 Prepared by: Krystal Clark  Exercises - Seated Calf Stretch with Strap  - 1-2 x daily - 7 x weekly - 3 sets - 3 reps - 30 hold - Toe Yoga - Alternating Great Toe and Lesser Toe Extension  - 1-2 x daily - 7 x weekly - 2 sets - 10 reps - Ankle Inversion Eversion Towel Slide  - 1 x daily - 7 x weekly - 2 sets - 10 reps - Seated Hip Adduction Isometrics with Ball  - 1-2 x daily - 7 x weekly - 3 sets - 10 reps - 3 hold  ASSESSMENT:  CLINICAL IMPRESSION: Patient continues to demonstrate decreased bilateral LE strength and  balance. Patient also demonstrates difficulty with SLS and other balance  activities during today's session. Patient able to initiate high intensity circuit training (sts with stair taps) with appropriate fatigue and visible sweat. Pt demonstrates need for CGA and gait belt for dynamic balance activities during today's treatment. Patient would continue to benefit from skilled physical therapy for increased endurance with ambulation, increased LE strength, and balance for improved quality of life, improved independence with gait training and continued progress towards therapy goals.   EVAL: Patient is a 73 y.o. female who was seen today for physical therapy evaluation and treatment for B foot drop. Patient's condition is further defined by difficulty with walking due to weakness, impaired proprioception and decreased soft tissue extensibility. Skilled PT is required to address the impairments and functional limitations listed below. Notified PCP via Epic about findings of the MRI.    OBJECTIVE IMPAIRMENTS: Abnormal gait, decreased activity tolerance, decreased balance, difficulty walking, decreased ROM, decreased strength, impaired flexibility, and postural dysfunction.   ACTIVITY LIMITATIONS: standing, squatting, stairs, and transfers  PARTICIPATION LIMITATIONS: meal prep, cleaning, laundry, driving, shopping, community activity, and yard work  PERSONAL FACTORS: Age, Time since onset of injury/illness/exacerbation, and 1-2 comorbidities: CMTD, PLIF  are also affecting patient's functional outcome.   REHAB POTENTIAL: Fair    CLINICAL DECISION MAKING: Evolving/moderate complexity  EVALUATION COMPLEXITY: Moderate   GOALS: Goals reviewed with patient? Yes  SHORT TERM GOALS: Target date: 08/09/23 Pt will demonstrate indep in HEP to facilitate carry-over of skilled services and improve functional outcomes Goal status: INITIAL  LONG TERM GOALS: Target date: 08/23/23  Pt will improve DGI by at  least 3 points in order to demonstrate clinically significant improvement in balance and decreased risk for falls Baseline: 14 Goal status: INITIAL  2.  Pt will increase by at least 40 ft in order to demonstrate clinically significant improvement in community ambulation Baseline: 349 ft Goal status: INITIAL  3.  Pt will demonstrate increase in ankle ROM by 5-10 deg  to facilitate ease in ADLs  and ambulation Baseline: see above Goal status: INITIAL  4.  Pt will demonstrate increase in LE strength to 3+/5 to facilitate ease and safety in ambulation Baseline: 2-/5 Goal status: INITIAL  PLAN:  PT FREQUENCY: 2x/week  PT DURATION: 4 weeks  PLANNED INTERVENTIONS: [id number]- PT Re-evaluation, 97110-Therapeutic exercises, 97530- Therapeutic activity, 97112- Neuromuscular re-education, 97535- Self Care, [id number]- Manual therapy, [id number]- Gait training, [phone]- Orthotic Fit/training, 97014- Electrical stimulation (unattended), [phone]- Electrical stimulation (manual), and Patient/Family education  PLAN FOR NEXT SESSION:  Begin LE flexibility and strengthening. May introduce electrical stimulation for ankle evertors. Progress dynamic SLS activities with gait belt for safety to pt tolerance.   Luz Lex, PT, DPT [facility] Office: [phone] 12:50 PM, 10/09/23

## 2023-10-10 ENCOUNTER — Other Ambulatory Visit (HOSPITAL_COMMUNITY): Payer: Self-pay

## 2023-10-10 ENCOUNTER — Other Ambulatory Visit: Payer: Self-pay

## 2023-10-13 ENCOUNTER — Ambulatory Visit (HOSPITAL_COMMUNITY): Attending: Neurology

## 2023-10-13 ENCOUNTER — Encounter (HOSPITAL_COMMUNITY): Payer: Self-pay

## 2023-10-13 DIAGNOSIS — R262 Difficulty in walking, not elsewhere classified: Secondary | ICD-10-CM | POA: Insufficient documentation

## 2023-10-13 DIAGNOSIS — M6281 Muscle weakness (generalized): Secondary | ICD-10-CM | POA: Insufficient documentation

## 2023-10-13 NOTE — Therapy (Signed)
 OUTPATIENT PHYSICAL THERAPY LOWER EXTREMITY TREATMENT   Patient Name: Julie May MRN: [id number] DOB:1950-12-06, 73 y.o., female Today's Date: 10/13/2023  END OF SESSION:  PT End of Session - 10/13/23 1434     Visit Number 3    Number of Visits 8    Authorization Type Medicare Part A & B    Progress Note Due on Visit 10    PT Start Time 1430    PT Stop Time 1511    PT Time Calculation (min) 41 min    Equipment Utilized During Treatment Gait belt    Activity Tolerance Patient tolerated treatment well    Behavior During Therapy WFL for tasks assessed/performed                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 Elie Confer Spine   TONSILECTOMY, ADENOIDECTOMY, BILATERAL MYRINGOTOMY AND TUBES     TONSILLECTOMY     uterine polyp removal      Patient Active Problem List   Diagnosis Date Noted   Impacted cerumen of both ears 08/04/2023   Left foot drop 05/07/2023   Encounter for immunization 05/07/2023   Low vitamin D level 05/07/2023   History of UTI 04/30/2023   Elevated LFTs 11/02/2022   Cat bite of forearm 10/29/2022   Spondylolisthesis of lumbar region 01/28/2020   Callus of foot  11/07/2017   Prediabetes 08/15/2017   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   Osteopenia 04/04/2015   Right foot pain 05/23/2013   Essential hypertension, benign 11/12/2010   Hypothyroidism 01/18/2010   Mixed hyperlipidemia 12/17/2009    PCP: Kerri Perches, MD  REFERRING PROVIDER: Glendale Chard, DO  REFERRING DIAG: M21.371,M21.372 (ICD-10-CM) - Bilateral foot-drop  THERAPY DIAG:  Difficulty in walking, not elsewhere classified  Muscle weakness of lower extremity  Rationale for Evaluation and Treatment: Rehabilitation  ONSET DATE: Years ago  SUBJECTIVE:   SUBJECTIVE STATEMENT: 6/10 pain in B feet.  EVAL: Arrives to the clinic due to foot drop and and walking outside her feet. Patient is also concerned about her balance. Denies any numbness. Condition with the foot drop began when she had spinal stenosis. Patient then had back surgery in July 2021 which fixed the foot drop. Recently, 3-4 months ago, the issues on her feet were back. 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 Patient was then referred to outpatient PT evaluation and management.  PERTINENT HISTORY: CMTD, PLIF PAIN:  Are you having pain? No  PRECAUTIONS: Fall  RED FLAGS: None   WEIGHT BEARING RESTRICTIONS: No  FALLS:  Has patient fallen  in last 6 months? Yes. Number of falls 1 (6 weeks to 2 months ago)  LIVING ENVIRONMENT: Lives with: lives with their spouse Lives in: House/apartment Stairs: No Has following equipment at home: Environmental consultant - 2 wheeled  OCCUPATION: retired  PLOF: Independent and Independent with basic ADLs  PATIENT GOALS: "to strengthen my legs and feet"  NEXT MD VISIT: February 2025  OBJECTIVE:  Note: Objective measures were completed at Evaluation unless otherwise noted.  DIAGNOSTIC FINDINGS:  06/27/23 MRI LUMBAR SPINE WITHOUT CONTRAST   TECHNIQUE: Multiplanar, multisequence MR imaging of the lumbar spine was performed. No  intravenous contrast was administered.   COMPARISON:  Lumbar spine radiograph 01/28/2020 and 03/01/2021. Lumbar MRI 01/02/2020.   FINDINGS: Segmentation: Transitional lumbosacral anatomy. In keeping with the numbering applied to the previous MRI, there is a transitional, partially lumbarized S1 segment. This numbering assumes interval L4-5 PLIF and bilateral ribs at the L1 level.   Alignment: Stable grade 1 fused anterolisthesis at L4-5 and similar grade 1 anterolisthesis at L5-S1.   Vertebrae: No worrisome osseous lesion, acute fracture or pars defect within the lumbar spine. Sagittal images demonstrate marrow edema and a fracture of the mid to distal sacrum asymmetric to the left. Interval L4-5 posterior decompression and PLIF.   Conus medullaris: Extends to the L2 level and appears normal.   Paraspinal and other soft tissues: No significant paraspinal findings. Mild fatty atrophy within the erector spinae musculature inferiorly.   Disc levels:   Sagittal images demonstrate mild lower thoracic spondylosis without evidence of significant spinal stenosis or foraminal narrowing.   L1-2: Mild loss of disc height with a new small right paracentral disc extrusion and superior migration of a small disc fragment. No resulting mass effect on the distal thoracic cord or foraminal narrowing.   L2-3: Preserved disc height with stable mild disc bulging and a small central annular fissure. Mild bilateral facet hypertrophy. No significant spinal stenosis or nerve root encroachment.   L3-4: Adjacent segment changes with mildly progressive loss of disc height, annular disc bulging and moderate facet and ligamentous hypertrophy. Resulting progressive multifactorial spinal stenosis, now moderate. In addition, there is moderate lateral recess and mild foraminal narrowing bilaterally.   L4-5: Interval posterior decompression and PLIF. The spinal canal and lateral recesses are well  decompressed. Mild residual inferior foraminal narrowing bilaterally due to spur without L4 nerve root encroachment.   L5-S1: Preserved disc height with mild disc bulging and moderate facet hypertrophy. No spinal stenosis or nerve root encroachment.   IMPRESSION: 1. Transitional lumbosacral anatomy. In keeping with the numbering applied to the previous MRI, there is a transitional, partially lumbarized S1 segment. This numbering assumes interval L4-5 PLIF and bilateral ribs at the L1 level. 2. Acute fracture of the mid to distal sacrum asymmetric to the left. 3. Interval posterior decompression and PLIF at L4-5 with well decompressed spinal canal and lateral recesses. Mild residual inferior foraminal narrowing bilaterally without L4 nerve root encroachment. 4. Adjacent segment changes at L3-4 with progressive multifactorial spinal stenosis, now moderate. In addition, there is moderate lateral recess and mild foraminal narrowing bilaterally. 5. New small right paracentral disc extrusion and superior migration of a small disc fragment at L1-2 without resulting mass effect on the distal thoracic cord or foraminal narrowing. 6. No other significant changes or nerve root encroachment.  PATIENT SURVEYS:  ABC scale 1280/1800 = 80%  COGNITION: Overall cognitive status: Within functional limits for tasks assessed     SENSATION: Light touch: WFL on B LE  MUSCLE LENGTH: Hamstrings: mild restriction on B Thomas test: moderate restriction on B Gastrocsoleus: moderate to severe restriction on B  POSTURE: rounded shoulders, forward head, increased thoracic kyphosis, and highly supinated feet, very slight genu varus  LOWER EXTREMITY ROM:  Active ROM Right eval Left eval Right 10/06/2023 Left 10/06/2023  Hip flexion [facility]    Hip extension [facility]    Hip abduction [facility]    Hip adduction      Hip internal rotation      Hip external rotation      Knee flexion [facility]    Knee  extension [facility] WFL    Ankle dorsiflexion 50-30 50-40 50-30 50-40  Ankle plantarflexion 50-56 50-56 50-56 50-56  Ankle inversion 20-50 10-40 20-50 10-40  Ankle eversion 20-10 10-8 20-10 10-8   (Blank rows = not tested)  LOWER EXTREMITY MMT:  MMT Right eval Left eval Right 10/06/2023 Left 10/06/2023  Hip flexion 4- 4-    Hip extension 4- 4-    Hip abduction 4- 4-    Hip adduction      Hip internal rotation      Hip external rotation      Knee flexion 4 4    Knee extension 4 4    Ankle dorsiflexion 2+ 2-    Ankle plantarflexion 3+ 3+    Ankle inversion 4 4    Ankle eversion 2- 2-     (Blank rows = not tested)   FUNCTIONAL TESTS:  5 times sit to stand: 9.59 sec 2 minute walk test: 349 ft Dynamic Gait Index: 14  DGI 07/26/23 1. Gait level surface (1) Moderate Impairment: Walks 20', slow speed, abnormal gait pattern, evidence for imbalance. 2. Change in gait speed (2) Mild Impairment: Is able to change speed but demonstrates mild gait deviations, or not gait deviations but unable to achieve a significant change in velocity, or uses an assistive device. 3. Gait with horizontal head turns (2) Mild Impairment: Performs head turns smoothly with slight change in gait velocity, i.e., minor disruption to smooth gait path or uses walking aid. 4. Gait with vertical head turns (2) Mild Impairment: Performs head turns smoothly with slight change in gait velocity, i.e., minor disruption to smooth gait path or uses walking aid. 5. Gait and pivot turn (2) Mild Impairment: Pivot turns safely in > 3 seconds and stops with no loss of balance. 6. Step over obstacle (1) Moderate Impairment: Is able to step over box but must stop, then step over. May require verbal cueing. 7. Step around obstacles (2) Mild Impairment: Is able to step around both cones, but must slow down and adjust steps to clear cones. 8. Stairs (2) Mild Impairment: Alternating feet, must use rail.  TOTAL SCORE: 14 /  24  GAIT: Distance walked: 349 ft Assistive device utilized: None Level of assistance: Complete Independence Comments: done during , patient demonstrates foot slap, excessive hip flexion on preswing, inadequate hip ext on terminal swing  TREATMENT DATE:  10/13/2023  -Recumbent bike x5' -Russian ESTIM x 15' duty cycle 50%  Cycle time 10/20  Channel 1: 40 on LLE & Channel 2: 50 -Discussion regarding potential ankle braces, AFOs, and inlclusion of that in daily activities and starting referral process to MD.     10/09/2023  Therapeutic Exercise: -Seated heel/toe raises, 2 sets of 10 bilateral -Red theraband eversion, 1 set of 10 reps bilateral, pt was not able to perform well due to weakness -3 way hip 1 set of 10 reps each side, BUE support needed for balance  Therapeutic Activity: -Step ups cueing for max glute extension, 1 set of 10 reps each side leading -STS with alternating stair taps, 2 sets of 8 reps, CGA with gait belt needed for safety  Neuromuscular Re-education: -SLS, 30 seconds bilateral, pt was not able to maintain balance without UE support every 2 seconds -NBOS, 30 second hold, Pt demonstrates knee shaking but no need for UE support for this activity -Semi-tandem, 30 second hold, bilateral, Pt demonstrates occasional need for UE support for this activity -Tandem stance, 30 second hold, bilateral, Pt demonstrates occasional need for UE support for this activity   10/06/2023  -DGI 1. Gait level surface (2) Mild Impairment: Walks 20', uses assistive devices, slower speed, mild gait deviations. 2. Change in gait speed (2) Mild Impairment: Is able to change speed but demonstrates mild gait deviations, or not gait deviations but unable to achieve a significant change in velocity, or uses an assistive device. 3. Gait with horizontal head  turns (2) Mild Impairment: Performs head turns smoothly with slight change in gait velocity, i.e., minor disruption to smooth gait path or uses walking aid. 4. Gait with vertical head turns (2) Mild Impairment: Performs head turns smoothly with slight change in gait velocity, i.e., minor disruption to smooth gait path or uses walking aid. 5. Gait and pivot turn (2) Mild Impairment: Pivot turns safely in > 3 seconds and stops with no loss of balance. 6. Step over obstacle (1) Moderate Impairment: Is able to step over box but must stop, then step over. May require verbal cueing. 7. Step around obstacles (2) Mild Impairment: Is able to step around both cones, but must slow down and adjust steps to clear cones. 8. Stairs (1) Moderate Impairment: Two feet to a stair, must use rail.  TOTAL SCORE: 14 / 24 - -5x STS: 9 seconcds -ROM -MMT  PATIENT EDUCATION:  Education details: Educated on the pathoanatomy of pes cavus and foot drop. Educated on the goals and course of rehab. Educated on measures to reduce falls at home Person educated: Patient Education method: Explanation Education comprehension: verbalized understanding  HOME EXERCISE PROGRAM: Access Code: [email or web address] URL: [email or web address] Date: 08/11/2023 Prepared by: Krystal Clark  Exercises - Seated Calf Stretch with Strap  - 1-2 x daily - 7 x weekly - 3 sets - 3 reps - 30 hold - Toe Yoga - Alternating Great Toe and Lesser Toe Extension  - 1-2 x daily - 7 x weekly - 2 sets - 10 reps - Ankle Inversion Eversion Towel Slide  - 1 x daily - 7 x weekly - 2 sets - 10 reps - Seated Hip Adduction Isometrics with Ball  - 1-2 x daily - 7 x weekly - 3 sets - 10 reps - 3 hold  ASSESSMENT:  CLINICAL IMPRESSION: Pt tolerated session. Main focus was e-stim for ankle everters, with poor activation and excessive ER of LE during activation. Did note minor redness around  patch site, and advised pt to monitor. Pt open to  orthotist referral and dynamic AFOs. Message pt's referring provider to regarding AFO thoughts. Patient would continue to benefit from skilled physical therapy for increased endurance with ambulation, increased LE strength, and balance for improved quality of life, improved independence with gait training and continued progress towards therapy goals.   EVAL: Patient is a 73 y.o. female who was seen today for physical therapy evaluation and treatment for B foot drop. Patient's condition is further defined by difficulty with walking due to weakness, impaired proprioception and decreased soft tissue extensibility. Skilled PT is required to address the impairments and functional limitations listed below. Notified PCP via Epic about findings of the MRI.    OBJECTIVE IMPAIRMENTS: Abnormal gait, decreased activity tolerance, decreased balance, difficulty walking, decreased ROM, decreased strength, impaired flexibility, and postural dysfunction.   ACTIVITY LIMITATIONS: standing, squatting, stairs, and transfers  PARTICIPATION LIMITATIONS: meal prep, cleaning, laundry, driving, shopping, community activity, and yard work  PERSONAL FACTORS: Age, Time since onset of injury/illness/exacerbation, and 1-2 comorbidities: CMTD, PLIF  are also affecting patient's functional outcome.   REHAB POTENTIAL: Fair    CLINICAL DECISION MAKING: Evolving/moderate complexity  EVALUATION COMPLEXITY: Moderate   GOALS: Goals reviewed with patient? Yes  SHORT TERM GOALS: Target date: 08/09/23 Pt will demonstrate indep in HEP to facilitate carry-over of skilled services and improve functional outcomes Goal status: INITIAL  LONG TERM GOALS: Target date: 08/23/23  Pt will improve DGI by at least 3 points in order to demonstrate clinically significant improvement in balance and decreased risk for falls Baseline: 14 Goal status: INITIAL  2.  Pt will increase by at least 40 ft in order to demonstrate clinically  significant improvement in community ambulation Baseline: 349 ft Goal status: INITIAL  3.  Pt will demonstrate increase in ankle ROM by 5-10 deg  to facilitate ease in ADLs  and ambulation Baseline: see above Goal status: INITIAL  4.  Pt will demonstrate increase in LE strength to 3+/5 to facilitate ease and safety in ambulation Baseline: 2-/5 Goal status: INITIAL  PLAN:  PT FREQUENCY: 2x/week  PT DURATION: 4 weeks  PLANNED INTERVENTIONS: [id number]- PT Re-evaluation, 97110-Therapeutic exercises, 97530- Therapeutic activity, 97112- Neuromuscular re-education, 97535- Self Care, [id number]- Manual therapy, [id number]- Gait training, [phone]- Orthotic Fit/training, 97014- Electrical stimulation (unattended), [phone]- Electrical stimulation (manual), and Patient/Family education  PLAN FOR NEXT SESSION:  Begin LE flexibility and strengthening. May introduce electrical stimulation for ankle evertors. Progress dynamic SLS activities with gait belt for safety to pt tolerance.  Nelida Meuse PT, DPT [facility] [phone] office   3:14 PM, 10/13/23

## 2023-10-18 ENCOUNTER — Encounter: Payer: Self-pay | Admitting: "Endocrinology

## 2023-10-19 ENCOUNTER — Ambulatory Visit (HOSPITAL_COMMUNITY): Admitting: Physical Therapy

## 2023-10-19 DIAGNOSIS — R262 Difficulty in walking, not elsewhere classified: Secondary | ICD-10-CM | POA: Diagnosis not present

## 2023-10-19 DIAGNOSIS — M6281 Muscle weakness (generalized): Secondary | ICD-10-CM

## 2023-10-19 NOTE — Therapy (Signed)
 OUTPATIENT PHYSICAL THERAPY LOWER EXTREMITY TREATMENT   Patient Name: Julie May MRN: [id number] DOB:April 13, 1951, 73 y.o., female Today's Date: 10/19/2023  END OF SESSION:  PT End of Session - 10/19/23 1246     Visit Number 4    Number of Visits 8    Authorization Type Medicare Part A & B    Progress Note Due on Visit 10    PT Start Time 1018    PT Stop Time 1104    PT Time Calculation (min) 46 min    Equipment Utilized During Treatment Gait belt    Activity Tolerance Patient tolerated treatment well    Behavior During Therapy WFL for tasks assessed/performed                 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 Elie Confer Spine   TONSILECTOMY, ADENOIDECTOMY, BILATERAL MYRINGOTOMY AND TUBES     TONSILLECTOMY     uterine polyp removal      Patient Active Problem List   Diagnosis Date Noted   Impacted cerumen of both ears 08/04/2023   Left foot drop 05/07/2023   Encounter for immunization 05/07/2023   Low vitamin D level 05/07/2023   History of UTI 04/30/2023   Elevated LFTs 11/02/2022   Cat bite of forearm 10/29/2022   Spondylolisthesis of lumbar region 01/28/2020   Callus of foot  11/07/2017   Prediabetes 08/15/2017   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   Osteopenia 04/04/2015   Right foot pain 05/23/2013   Essential hypertension, benign 11/12/2010   Hypothyroidism 01/18/2010   Mixed hyperlipidemia 12/17/2009    PCP: Kerri Perches, MD  REFERRING PROVIDER: Glendale Chard, DO  REFERRING DIAG: M21.371,M21.372 (ICD-10-CM) - Bilateral foot-drop  THERAPY DIAG:  No diagnosis found.  Rationale for Evaluation and Treatment: Rehabilitation  ONSET DATE: Years ago  SUBJECTIVE:   SUBJECTIVE STATEMENT: 10/19/23:  pt reports she decided on the brace for her Rt to help with the rolling out to the sides of her feet with ambulation.  States she is not to keen of getting the brace for her Lt foot drop. Reports no pain in her feet currently. Has been having issues with allergies due to pollen.  EVAL: Arrives to the clinic due to foot drop and and walking outside her feet. Patient is also concerned about her balance. Denies any numbness. Condition with the foot drop began when she had spinal stenosis. Patient then had back surgery in July 2021 which fixed the foot drop. Recently, 3-4 months ago, the issues on her feet were back. 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 Patient was then referred to outpatient PT evaluation and management.  PERTINENT HISTORY: CMTD, PLIF PAIN:  Are you having pain? No  PRECAUTIONS: Fall  RED FLAGS: None   WEIGHT BEARING RESTRICTIONS: No  FALLS:  Has patient fallen in last 6 months? Yes. Number of falls 1 (6 weeks to 2 months ago)  LIVING ENVIRONMENT: Lives with: lives with their spouse Lives in: House/apartment Stairs: No Has following equipment at home: Environmental consultant - 2 wheeled  OCCUPATION: retired  PLOF: Independent and Independent with basic ADLs  PATIENT GOALS: "to strengthen my legs and feet"  NEXT MD VISIT: February 2025  OBJECTIVE:  Note: Objective measures were completed  at Evaluation unless otherwise noted.  DIAGNOSTIC FINDINGS:  06/27/23 MRI LUMBAR SPINE WITHOUT CONTRAST   TECHNIQUE: Multiplanar, multisequence MR imaging of the lumbar spine was performed. No intravenous contrast was administered.   COMPARISON:  Lumbar spine radiograph 01/28/2020 and 03/01/2021. Lumbar MRI 01/02/2020.   FINDINGS: Segmentation: Transitional lumbosacral anatomy. In keeping with the numbering applied to the previous MRI, there is a transitional, partially lumbarized S1 segment. This numbering assumes interval L4-5 PLIF and bilateral ribs at the L1 level.   Alignment: Stable grade 1 fused anterolisthesis at L4-5 and similar grade 1 anterolisthesis at L5-S1.   Vertebrae: No worrisome osseous lesion, acute fracture or pars defect within the lumbar spine. Sagittal images demonstrate marrow edema and a fracture of the mid to distal sacrum asymmetric to the left. Interval L4-5 posterior decompression and PLIF.   Conus medullaris: Extends to the L2 level and appears normal.   Paraspinal and other soft tissues: No significant paraspinal findings. Mild fatty atrophy within the erector spinae musculature inferiorly.   Disc levels:   Sagittal images demonstrate mild lower thoracic spondylosis without evidence of significant spinal stenosis or foraminal narrowing.   L1-2: Mild loss of disc height with a new small right paracentral disc extrusion and superior migration of a small disc fragment. No resulting mass effect on the distal thoracic cord or foraminal narrowing.   L2-3: Preserved disc height with stable mild disc bulging and a small central annular fissure. Mild bilateral facet hypertrophy. No significant spinal stenosis or nerve root encroachment.   L3-4: Adjacent segment changes with mildly progressive loss of disc height, annular disc bulging and moderate facet and ligamentous hypertrophy. Resulting progressive multifactorial spinal stenosis, now  moderate. In addition, there is moderate lateral recess and mild foraminal narrowing bilaterally.   L4-5: Interval posterior decompression and PLIF. The spinal canal and lateral recesses are well decompressed. Mild residual inferior foraminal narrowing bilaterally due to spur without L4 nerve root encroachment.   L5-S1: Preserved disc height with mild disc bulging and moderate facet hypertrophy. No spinal stenosis or nerve root encroachment.   IMPRESSION: 1. Transitional lumbosacral anatomy. In keeping with the numbering applied to the previous MRI, there is a transitional, partially lumbarized S1 segment. This numbering assumes interval L4-5 PLIF and bilateral ribs at the L1 level. 2. Acute fracture of the mid to distal sacrum asymmetric to the left. 3. Interval posterior decompression and PLIF at L4-5 with well decompressed spinal canal and lateral recesses. Mild residual inferior foraminal narrowing bilaterally without L4 nerve root encroachment. 4. Adjacent segment changes at L3-4 with progressive multifactorial spinal stenosis, now moderate. In addition, there is moderate lateral recess and mild foraminal narrowing bilaterally. 5. New small right paracentral disc extrusion and superior migration of a small disc fragment at L1-2 without resulting mass effect on the distal thoracic  cord or foraminal narrowing. 6. No other significant changes or nerve root encroachment.  PATIENT SURVEYS:  ABC scale 1280/1800 = 80%  COGNITION: Overall cognitive status: Within functional limits for tasks assessed     SENSATION: Light touch: WFL on B LE  MUSCLE LENGTH: Hamstrings: mild restriction on B Thomas test: moderate restriction on B Gastrocsoleus: moderate to severe restriction on B  POSTURE: rounded shoulders, forward head, increased thoracic kyphosis, and highly supinated feet, very slight genu varus  LOWER EXTREMITY ROM:  Active ROM Right eval Left eval Right 10/06/2023  Left 10/06/2023  Hip flexion [facility]    Hip extension [facility]    Hip abduction [facility]    Hip adduction      Hip internal rotation      Hip external rotation      Knee flexion [facility]    Knee extension [facility] WFL    Ankle dorsiflexion 50-30 50-40 50-30 50-40  Ankle plantarflexion 50-56 50-56 50-56 50-56  Ankle inversion 20-50 10-40 20-50 10-40  Ankle eversion 20-10 10-8 20-10 10-8   (Blank rows = not tested)  LOWER EXTREMITY MMT:  MMT Right eval Left eval Right 10/06/2023 Left 10/06/2023  Hip flexion 4- 4-    Hip extension 4- 4-    Hip abduction 4- 4-    Hip adduction      Hip internal rotation      Hip external rotation      Knee flexion 4 4    Knee extension 4 4    Ankle dorsiflexion 2+ 2-    Ankle plantarflexion 3+ 3+    Ankle inversion 4 4    Ankle eversion 2- 2-     (Blank rows = not tested)   FUNCTIONAL TESTS:  5 times sit to stand: 9.59 sec 2 minute walk test: 349 ft Dynamic Gait Index: 14  DGI 07/26/23 1. Gait level surface (1) Moderate Impairment: Walks 20', slow speed, abnormal gait pattern, evidence for imbalance. 2. Change in gait speed (2) Mild Impairment: Is able to change speed but demonstrates mild gait deviations, or not gait deviations but unable to achieve a significant change in velocity, or uses an assistive device. 3. Gait with horizontal head turns (2) Mild Impairment: Performs head turns smoothly with slight change in gait velocity, i.e., minor disruption to smooth gait path or uses walking aid. 4. Gait with vertical head turns (2) Mild Impairment: Performs head turns smoothly with slight change in gait velocity, i.e., minor disruption to smooth gait path or uses walking aid. 5. Gait and pivot turn (2) Mild Impairment: Pivot turns safely in > 3 seconds and stops with no loss of balance. 6. Step over obstacle (1) Moderate Impairment: Is able to step over box but must stop, then step over. May require verbal cueing. 7. Step around obstacles (2)  Mild Impairment: Is able to step around both cones, but must slow down and adjust steps to clear cones. 8. Stairs (2) Mild Impairment: Alternating feet, must use rail.  TOTAL SCORE: 14 / 24  GAIT: Distance walked: 349 ft Assistive device utilized: None Level of assistance: Complete Independence Comments: done during , patient demonstrates foot slap, excessive hip flexion on preswing, inadequate hip ext on terminal swing  TREATMENT DATE:  10/19/23 Seated:  heel raise 20X  Toe raises 20X  Towel pull (windshield wipers) 2 minutes each foot Standing vectors 2 sets 5 reps 3-5" holds (Rt stance much more difficult) [place] ESTIM 10 minutes co-contraction Rt and Lt eversion   duty cycle 50%  Cycle time 10/20  Channel 1 Lt LE: , Channel 2: 43 mAcc  10/13/2023  -Recumbent bike x5' -Russian ESTIM x 15' duty cycle 50%  Cycle time 10/20  Channel 1: 40 on LLE & Channel 2: 50 -Discussion regarding potential ankle braces, AFOs, and inlclusion of that in daily activities and starting referral process to MD.     10/09/2023  Therapeutic Exercise: -Seated heel/toe raises, 2 sets of 10 bilateral -Red theraband eversion, 1 set of 10 reps bilateral, pt was not able to perform well due to weakness -3 way hip 1 set of 10 reps each side, BUE support needed for balance  Therapeutic Activity: -Step ups cueing for max glute extension, 1 set of 10 reps each side leading -STS with alternating stair taps, 2 sets of 8 reps, CGA with gait belt needed for safety  Neuromuscular Re-education: -SLS, 30 seconds bilateral, pt was not able to maintain balance without UE support every 2 seconds -NBOS, 30 second hold, Pt demonstrates knee shaking but no need for UE support for this activity -Semi-tandem, 30 second hold, bilateral, Pt demonstrates occasional need for UE support for  this activity -Tandem stance, 30 second hold, bilateral, Pt demonstrates occasional need for UE support for this activity   10/06/2023  -DGI 1. Gait level surface (2) Mild Impairment: Walks 20', uses assistive devices, slower speed, mild gait deviations. 2. Change in gait speed (2) Mild Impairment: Is able to change speed but demonstrates mild gait deviations, or not gait deviations but unable to achieve a significant change in velocity, or uses an assistive device. 3. Gait with horizontal head turns (2) Mild Impairment: Performs head turns smoothly with slight change in gait velocity, i.e., minor disruption to smooth gait path or uses walking aid. 4. Gait with vertical head turns (2) Mild Impairment: Performs head turns smoothly with slight change in gait velocity, i.e., minor disruption to smooth gait path or uses walking aid. 5. Gait and pivot turn (2) Mild Impairment: Pivot turns safely in > 3 seconds and stops with no loss of balance. 6. Step over obstacle (1) Moderate Impairment: Is able to step over box but must stop, then step over. May require verbal cueing. 7. Step around obstacles (2) Mild Impairment: Is able to step around both cones, but must slow down and adjust steps to clear cones. 8. Stairs (1) Moderate Impairment: Two feet to a stair, must use rail.  TOTAL SCORE: 14 / 24 - -5x STS: 9 seconcds -ROM -MMT  PATIENT EDUCATION:  Education details: Educated on the pathoanatomy of pes cavus and foot drop. Educated on the goals and course of rehab. Educated on measures to reduce falls at home Person educated: Patient Education method: Explanation Education comprehension: verbalized understanding  HOME EXERCISE PROGRAM: Access Code: [email or web address] URL: [email or web address] Date: 08/11/2023 Prepared by: Krystal Clark  Exercises - Seated Calf Stretch with Strap  - 1-2 x daily - 7 x weekly - 3 sets - 3 reps - 30 hold - Toe Yoga - Alternating Great Toe and  Lesser Toe Extension  - 1-2 x daily - 7 x weekly - 2 sets - 10 reps - Ankle Inversion Eversion Towel Slide  - 1 x daily - 7 x  weekly - 2 sets - 10 reps - Seated Hip Adduction Isometrics with Ball  - 1-2 x daily - 7 x weekly - 3 sets - 10 reps - 3 hold  ASSESSMENT:  CLINICAL IMPRESSION: Discussed bracing with pt reluctant to try on Lt; discussed possibly having on hand to only use if she knows she will be doing longer or harder periods of activity.  Added windshield wipers with towel to help recruit everters;  vectors are notably hard for patient, especially with Rt weight bearing; early mm fatigue.  Continued with russian estim for bilateral ankle everters.  Pt reported good contraction, however little visiable activation initiated.Cues to work with the motion when intensity increased.  No redness other than proximal Rt LE from patch, however very faint and without pain.  Patient will continue to benefit from skilled physical therapy for increased endurance with ambulation, increased LE strength, and balance for improved quality of life, improved independence with gait training and continued progress towards therapy goals.   EVAL: Patient is a 73 y.o. female who was seen today for physical therapy evaluation and treatment for B foot drop. Patient's condition is further defined by difficulty with walking due to weakness, impaired proprioception and decreased soft tissue extensibility. Skilled PT is required to address the impairments and functional limitations listed below. Notified PCP via Epic about findings of the MRI.    OBJECTIVE IMPAIRMENTS: Abnormal gait, decreased activity tolerance, decreased balance, difficulty walking, decreased ROM, decreased strength, impaired flexibility, and postural dysfunction.   ACTIVITY LIMITATIONS: standing, squatting, stairs, and transfers  PARTICIPATION LIMITATIONS: meal prep, cleaning, laundry, driving, shopping, community activity, and yard work  PERSONAL  FACTORS: Age, Time since onset of injury/illness/exacerbation, and 1-2 comorbidities: CMTD, PLIF  are also affecting patient's functional outcome.   REHAB POTENTIAL: Fair    CLINICAL DECISION MAKING: Evolving/moderate complexity  EVALUATION COMPLEXITY: Moderate   GOALS: Goals reviewed with patient? Yes  SHORT TERM GOALS: Target date: 08/09/23 Pt will demonstrate indep in HEP to facilitate carry-over of skilled services and improve functional outcomes Goal status: INITIAL  LONG TERM GOALS: Target date: 08/23/23  Pt will improve DGI by at least 3 points in order to demonstrate clinically significant improvement in balance and decreased risk for falls Baseline: 14 Goal status: INITIAL  2.  Pt will increase by at least 40 ft in order to demonstrate clinically significant improvement in community ambulation Baseline: 349 ft Goal status: INITIAL  3.  Pt will demonstrate increase in ankle ROM by 5-10 deg  to facilitate ease in ADLs  and ambulation Baseline: see above Goal status: INITIAL  4.  Pt will demonstrate increase in LE strength to 3+/5 to facilitate ease and safety in ambulation Baseline: 2-/5 Goal status: INITIAL  PLAN:  PT FREQUENCY: 2x/week  PT DURATION: 4 weeks  PLANNED INTERVENTIONS: 97164- PT Re-evaluation, 97110-Therapeutic exercises, 97530- Therapeutic activity, 97112- Neuromuscular re-education, 97535- Self Care, [id number]- Manual therapy, [id number]- Gait training, [phone]- Orthotic Fit/training, 97014- Electrical stimulation (unattended), [phone]- Electrical stimulation (manual), and Patient/Family education  PLAN FOR NEXT SESSION:  continue to progress LE flexibility and strengthening with electrical stimulation for ankle evertors. Progress dynamic SLS activities with gait belt for safety to pt tolerance.  Lurena Nida, PTA/CLT [facility] Ph: [phone]   12:47 PM, 10/19/23

## 2023-10-25 ENCOUNTER — Encounter (HOSPITAL_COMMUNITY): Payer: Self-pay

## 2023-10-25 ENCOUNTER — Ambulatory Visit (HOSPITAL_COMMUNITY)

## 2023-10-25 DIAGNOSIS — M6281 Muscle weakness (generalized): Secondary | ICD-10-CM | POA: Diagnosis not present

## 2023-10-25 DIAGNOSIS — R262 Difficulty in walking, not elsewhere classified: Secondary | ICD-10-CM | POA: Diagnosis not present

## 2023-10-25 NOTE — Therapy (Signed)
 OUTPATIENT PHYSICAL THERAPY LOWER EXTREMITY TREATMENT   Patient Name: Julie May MRN: [id number] DOB:1951-07-01, 73 y.o., female Today's Date: 10/25/2023  END OF SESSION:  PT End of Session - 10/25/23 1430     Visit Number 5    Number of Visits 8    Date for PT Re-Evaluation 11/17/23    Authorization Type Medicare Part A & B    Progress Note Due on Visit 10    PT Start Time 1345    PT Stop Time 1430    PT Time Calculation (min) 45 min    Activity Tolerance Patient tolerated treatment well    Behavior During Therapy [facility] for tasks assessed/performed                  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 Elie Confer Spine   TONSILECTOMY, ADENOIDECTOMY, BILATERAL MYRINGOTOMY AND TUBES     TONSILLECTOMY     uterine polyp removal      Patient Active Problem List   Diagnosis Date Noted   Impacted cerumen of both ears 08/04/2023   Left foot drop 05/07/2023   Encounter for immunization 05/07/2023   Low vitamin D level 05/07/2023   History of UTI 04/30/2023   Elevated LFTs 11/02/2022   Cat bite of forearm 10/29/2022   Spondylolisthesis of lumbar region 01/28/2020   Callus of foot 11/07/2017    Prediabetes 08/15/2017   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   Osteopenia 04/04/2015   Right foot pain 05/23/2013   Essential hypertension, benign 11/12/2010   Hypothyroidism 01/18/2010   Mixed hyperlipidemia 12/17/2009    PCP: Kerri Perches, MD  REFERRING PROVIDER: Glendale Chard, DO  REFERRING DIAG: M21.371,M21.372 (ICD-10-CM) - Bilateral foot-drop  THERAPY DIAG:  Muscle weakness of lower extremity  Difficulty in walking, not elsewhere classified  Rationale for Evaluation and Treatment: Rehabilitation  ONSET DATE: Years ago  SUBJECTIVE:   SUBJECTIVE STATEMENT: Pt reports no soreness since last session. Pt does report wearing brace on the right side. Pt would like to keep trying exercises instead of getting fitted for the AFO for the foot drop.  EVAL: Arrives to the clinic due to foot drop and and walking outside her feet. Patient is also concerned about her balance. Denies any numbness. Condition with the foot drop began when she had spinal stenosis. Patient then had back surgery in July 2021 which fixed the foot drop. Recently, 3-4 months ago, the issues on her feet were back. 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 Patient was then referred to outpatient PT evaluation and management.  PERTINENT HISTORY: CMTD, PLIF PAIN:  Are you having pain? No  PRECAUTIONS: Fall  RED FLAGS: None   WEIGHT BEARING RESTRICTIONS: No  FALLS:  Has patient fallen in last 6 months? Yes. Number of falls 1 (6 weeks to 2 months ago)  LIVING ENVIRONMENT: Lives with: lives with their spouse Lives in: House/apartment Stairs: No Has following equipment at home: Environmental consultant - 2 wheeled  OCCUPATION: retired  PLOF: Independent and Independent with basic ADLs  PATIENT GOALS: "to strengthen my legs and feet"  NEXT MD VISIT: February 2025  OBJECTIVE:  Note: Objective measures were completed at Evaluation unless otherwise  noted.  DIAGNOSTIC FINDINGS:  06/27/23 MRI LUMBAR SPINE WITHOUT CONTRAST   TECHNIQUE: Multiplanar, multisequence MR imaging of the lumbar spine was performed. No intravenous contrast was administered.   COMPARISON:  Lumbar spine radiograph 01/28/2020 and 03/01/2021. Lumbar MRI 01/02/2020.   FINDINGS: Segmentation: Transitional lumbosacral anatomy. In keeping with the numbering applied to the previous MRI, there is a transitional, partially lumbarized S1 segment. This numbering assumes interval L4-5 PLIF and bilateral ribs at the L1 level.   Alignment: Stable grade 1 fused anterolisthesis at L4-5 and similar grade 1 anterolisthesis at L5-S1.   Vertebrae: No worrisome osseous lesion, acute fracture or pars defect within the lumbar spine. Sagittal images demonstrate marrow edema and a fracture of the mid to distal sacrum asymmetric to the left. Interval L4-5 posterior decompression and PLIF.   Conus medullaris: Extends to the L2 level and appears normal.   Paraspinal and other soft tissues: No significant paraspinal findings. Mild fatty atrophy within the erector spinae musculature inferiorly.   Disc levels:   Sagittal images demonstrate mild lower thoracic spondylosis without evidence of significant spinal stenosis or foraminal narrowing.   L1-2: Mild loss of disc height with a new small right paracentral disc extrusion and superior migration of a small disc fragment. No resulting mass effect on the distal thoracic cord or foraminal narrowing.   L2-3: Preserved disc height with stable mild disc bulging and a small central annular fissure. Mild bilateral facet hypertrophy. No significant spinal stenosis or nerve root encroachment.   L3-4: Adjacent segment changes with mildly progressive loss of disc height, annular disc bulging and moderate facet and ligamentous hypertrophy. Resulting progressive multifactorial spinal stenosis, now moderate. In addition, there is  moderate lateral recess and mild foraminal narrowing bilaterally.   L4-5: Interval posterior decompression and PLIF. The spinal canal and lateral recesses are well decompressed. Mild residual inferior foraminal narrowing bilaterally due to spur without L4 nerve root encroachment.   L5-S1: Preserved disc height with mild disc bulging and moderate facet hypertrophy. No spinal stenosis or nerve root encroachment.   IMPRESSION: 1. Transitional lumbosacral anatomy. In keeping with the numbering applied to the previous MRI, there is a transitional, partially lumbarized S1 segment. This numbering assumes interval L4-5 PLIF and bilateral ribs at the L1 level. 2. Acute fracture of the mid to distal sacrum asymmetric to the left. 3. Interval posterior decompression and PLIF at L4-5 with well decompressed spinal canal and lateral recesses. Mild residual inferior foraminal narrowing bilaterally without L4 nerve root encroachment. 4. Adjacent segment changes at L3-4 with progressive multifactorial spinal stenosis, now moderate. In addition, there is moderate lateral recess and mild foraminal narrowing bilaterally. 5. New small right paracentral disc extrusion and superior migration of a small disc fragment at L1-2 without resulting mass effect on the distal thoracic cord or foraminal narrowing. 6. No other significant changes or nerve root encroachment.  PATIENT  SURVEYS:  ABC scale 1280/1800 = 80%  COGNITION: Overall cognitive status: Within functional limits for tasks assessed     SENSATION: Light touch: WFL on B LE  MUSCLE LENGTH: Hamstrings: mild restriction on B Thomas test: moderate restriction on B Gastrocsoleus: moderate to severe restriction on B  POSTURE: rounded shoulders, forward head, increased thoracic kyphosis, and highly supinated feet, very slight genu varus  LOWER EXTREMITY ROM:  Active ROM Right eval Left eval Right 10/06/2023 Left 10/06/2023  Hip flexion [facility]  [facility]    Hip extension [facility]    Hip abduction [facility]    Hip adduction      Hip internal rotation      Hip external rotation      Knee flexion [facility]    Knee extension [facility] WFL    Ankle dorsiflexion 50-30 50-40 50-30 50-40  Ankle plantarflexion 50-56 50-56 50-56 50-56  Ankle inversion 20-50 10-40 20-50 10-40  Ankle eversion 20-10 10-8 20-10 10-8   (Blank rows = not tested)  LOWER EXTREMITY MMT:  MMT Right eval Left eval Right 10/06/2023 Left 10/06/2023  Hip flexion 4- 4-    Hip extension 4- 4-    Hip abduction 4- 4-    Hip adduction      Hip internal rotation      Hip external rotation      Knee flexion 4 4    Knee extension 4 4    Ankle dorsiflexion 2+ 2-    Ankle plantarflexion 3+ 3+    Ankle inversion 4 4    Ankle eversion 2- 2-     (Blank rows = not tested)   FUNCTIONAL TESTS:  5 times sit to stand: 9.59 sec 2 minute walk test: 349 ft Dynamic Gait Index: 14  DGI 07/26/23 1. Gait level surface (1) Moderate Impairment: Walks 20', slow speed, abnormal gait pattern, evidence for imbalance. 2. Change in gait speed (2) Mild Impairment: Is able to change speed but demonstrates mild gait deviations, or not gait deviations but unable to achieve a significant change in velocity, or uses an assistive device. 3. Gait with horizontal head turns (2) Mild Impairment: Performs head turns smoothly with slight change in gait velocity, i.e., minor disruption to smooth gait path or uses walking aid. 4. Gait with vertical head turns (2) Mild Impairment: Performs head turns smoothly with slight change in gait velocity, i.e., minor disruption to smooth gait path or uses walking aid. 5. Gait and pivot turn (2) Mild Impairment: Pivot turns safely in > 3 seconds and stops with no loss of balance. 6. Step over obstacle (1) Moderate Impairment: Is able to step over box but must stop, then step over. May require verbal cueing. 7. Step around obstacles (2) Mild Impairment: Is able to step  around both cones, but must slow down and adjust steps to clear cones. 8. Stairs (2) Mild Impairment: Alternating feet, must use rail.  TOTAL SCORE: 14 / 24  GAIT: Distance walked: 349 ft Assistive device utilized: None Level of assistance: Complete Independence Comments: done during , patient demonstrates foot slap, excessive hip flexion on preswing, inadequate hip ext on terminal swing  TREATMENT DATE:  10/25/2023  Therapeutic Exercise: -Seated heel/toe raises, 2 sets of 10 bilateral -3 way hip on blue foam, 1 set of 5 reps each side, BUE support needed for balance, pt cued for keeping SLS limb stable during movement -BAPS board 1 set of 10 reps PF/DF in // bars and opposite LE support on board for completion, bilaterally  Neuromuscular Re-education: -Aeromat lateral walks and heel/toe walks, 3 laps each direction, pt cued for ankle control -Blue foam standing balance, 30 second bouts  -normal BOS  -narrow BOS  -Staggered stance, one foot on mat one foot on floor -Squats on upside down bosu ball with // bars, 2 set of 5 reps    10/19/23 Seated:  heel raise 20X  Toe raises 20X  Towel pull (windshield wipers) 2 minutes each foot Standing vectors 2 sets 5 reps 3-5" holds (Rt stance much more difficult) [place] ESTIM 10 minutes co-contraction Rt and Lt eversion   duty cycle 50%  Cycle time 10/20  Channel 1 Lt LE: , Channel 2: 43 mAcc  10/13/2023  -Recumbent bike x5' -[place] ESTIM x 15' duty cycle 50%  Cycle time 10/20  Channel 1: 40 on LLE & Channel 2: 50 -Discussion regarding potential ankle braces, AFOs, and inlclusion of that in daily activities and starting referral process to MD.     10/09/2023  Therapeutic Exercise: -Seated heel/toe raises, 2 sets of 10 bilateral -Red theraband eversion, 1 set of 10 reps bilateral, pt was not able  to perform well due to weakness -3 way hip 1 set of 10 reps each side, BUE support needed for balance  Therapeutic Activity: -Step ups cueing for max glute extension, 1 set of 10 reps each side leading -STS with alternating stair taps, 2 sets of 8 reps, CGA with gait belt needed for safety  Neuromuscular Re-education: -SLS, 30 seconds bilateral, pt was not able to maintain balance without UE support every 2 seconds -NBOS, 30 second hold, Pt demonstrates knee shaking but no need for UE support for this activity -Semi-tandem, 30 second hold, bilateral, Pt demonstrates occasional need for UE support for this activity -Tandem stance, 30 second hold, bilateral, Pt demonstrates occasional need for UE support for this activity   10/06/2023  -DGI 1. Gait level surface (2) Mild Impairment: Walks 20', uses assistive devices, slower speed, mild gait deviations. 2. Change in gait speed (2) Mild Impairment: Is able to change speed but demonstrates mild gait deviations, or not gait deviations but unable to achieve a significant change in velocity, or uses an assistive device. 3. Gait with horizontal head turns (2) Mild Impairment: Performs head turns smoothly with slight change in gait velocity, i.e., minor disruption to smooth gait path or uses walking aid. 4. Gait with vertical head turns (2) Mild Impairment: Performs head turns smoothly with slight change in gait velocity, i.e., minor disruption to smooth gait path or uses walking aid. 5. Gait and pivot turn (2) Mild Impairment: Pivot turns safely in > 3 seconds and stops with no loss of balance. 6. Step over obstacle (1) Moderate Impairment: Is able to step over box but must stop, then step over. May require verbal cueing. 7. Step around obstacles (2) Mild Impairment: Is able to step around both cones, but must slow down and adjust steps to clear cones. 8. Stairs (1) Moderate Impairment: Two feet to a stair, must use rail.  TOTAL SCORE: 14 /  24 - -5x STS: 9 seconcds -ROM -MMT  PATIENT EDUCATION:  Education  details: Educated on the pathoanatomy of pes cavus and foot drop. Educated on the goals and course of rehab. Educated on measures to reduce falls at home Person educated: Patient Education method: Explanation Education comprehension: verbalized understanding  HOME EXERCISE PROGRAM: Access Code: [email or web address] URL: [email or web address] Date: 08/11/2023 Prepared by: Juline Ohara  Exercises - Seated Calf Stretch with Strap  - 1-2 x daily - 7 x weekly - 3 sets - 3 reps - 30 hold - Toe Yoga - Alternating Great Toe and Lesser Toe Extension  - 1-2 x daily - 7 x weekly - 2 sets - 10 reps - Ankle Inversion Eversion Towel Slide  - 1 x daily - 7 x weekly - 2 sets - 10 reps - Seated Hip Adduction Isometrics with Ball  - 1-2 x daily - 7 x weekly - 3 sets - 10 reps - 3 hold  ASSESSMENT:  CLINICAL IMPRESSION: Patient continues to demonstrate decreased LE strength, decreased gait quality and balance. Patient also demonstrates decreased ankle stability with R ankle more so. Patient able to  progress dynamic balance and core activation exercises today with squats on bosu ball, good performance with verbal cueing. Pt also able to complete heel/toe and lateral stepping on aero foam mat with verbal cueing for ankle alignment. Patient would continue to benefit from skilled physical therapy for increased endurance with ambulation, increased LE strength, and improved balance for improved quality of life, improved gait pattern and continued progress towards therapy goals.    EVAL: Patient is a 73 y.o. female who was seen today for physical therapy evaluation and treatment for B foot drop. Patient's condition is further defined by difficulty with walking due to weakness, impaired proprioception and decreased soft tissue extensibility. Skilled PT is required to address the impairments and functional limitations listed below.  Notified PCP via Epic about findings of the MRI.    OBJECTIVE IMPAIRMENTS: Abnormal gait, decreased activity tolerance, decreased balance, difficulty walking, decreased ROM, decreased strength, impaired flexibility, and postural dysfunction.   ACTIVITY LIMITATIONS: standing, squatting, stairs, and transfers  PARTICIPATION LIMITATIONS: meal prep, cleaning, laundry, driving, shopping, community activity, and yard work  PERSONAL FACTORS: Age, Time since onset of injury/illness/exacerbation, and 1-2 comorbidities: CMTD, PLIF  are also affecting patient's functional outcome.   REHAB POTENTIAL: Fair    CLINICAL DECISION MAKING: Evolving/moderate complexity  EVALUATION COMPLEXITY: Moderate   GOALS: Goals reviewed with patient? Yes  SHORT TERM GOALS: Target date: 08/09/23 Pt will demonstrate indep in HEP to facilitate carry-over of skilled services and improve functional outcomes Goal status: INITIAL  LONG TERM GOALS: Target date: 08/23/23  Pt will improve DGI by at least 3 points in order to demonstrate clinically significant improvement in balance and decreased risk for falls Baseline: 14 Goal status: INITIAL  2.  Pt will increase by at least 40 ft in order to demonstrate clinically significant improvement in community ambulation Baseline: 349 ft Goal status: INITIAL  3.  Pt will demonstrate increase in ankle ROM by 5-10 deg  to facilitate ease in ADLs  and ambulation Baseline: see above Goal status: INITIAL  4.  Pt will demonstrate increase in LE strength to 3+/5 to facilitate ease and safety in ambulation Baseline: 2-/5 Goal status: INITIAL  PLAN:  PT FREQUENCY: 2x/week  PT DURATION: 4 weeks  PLANNED INTERVENTIONS: 97164- PT Re-evaluation, 97110-Therapeutic exercises, 97530- Therapeutic activity, 97112- Neuromuscular re-education, 97535- Self Care, 16109- Manual therapy, [id number]- Gait training, [phone]- Orthotic Fit/training, 97014- Electrical stimulation (unattended),  [id number]- Electrical stimulation (manual), and  Patient/Family education  PLAN FOR NEXT SESSION:  continue to progress LE flexibility and strengthening with electrical stimulation for ankle evertors. Progress dynamic SLS activities with gait belt for safety to pt tolerance. Follow up with [facility] as potential referral, (see Derwood Flor).  Armond Bertin, PT, DPT [facility] Office: [phone] 2:35 PM, 10/25/23'

## 2023-10-30 ENCOUNTER — Ambulatory Visit (HOSPITAL_COMMUNITY): Admitting: Physical Therapy

## 2023-10-30 DIAGNOSIS — R262 Difficulty in walking, not elsewhere classified: Secondary | ICD-10-CM | POA: Diagnosis not present

## 2023-10-30 DIAGNOSIS — M6281 Muscle weakness (generalized): Secondary | ICD-10-CM

## 2023-10-30 NOTE — Therapy (Signed)
 OUTPATIENT PHYSICAL THERAPY LOWER EXTREMITY TREATMENT   Patient Name: Julie May MRN: [id number] DOB:1951-01-24, 73 y.o., female Today's Date: 10/30/2023  END OF SESSION:  PT End of Session - 10/30/23 1024     Visit Number 6    Number of Visits 8    Date for PT Re-Evaluation 11/17/23    Authorization Type Medicare Part A & B    Progress Note Due on Visit 10    PT Start Time 1020    PT Stop Time 1100    PT Time Calculation (min) 40 min    Activity Tolerance Patient tolerated treatment well    Behavior During Therapy [facility] for tasks assessed/performed                  Past Medical History:  Diagnosis Date   Bilateral impacted cerumen 04/10/2019   Callus of foot 11/07/2017   Foot pain, bilateral 05/23/2013   Hyperlipidemia    Hyperpotassemia    Hypertension    new dx 11/12/2010   Hypothyroidism    OA (osteoarthritis)    Other abnormal glucose    Overweight(278.02)    Polycythemia, secondary    Routine general medical examination at a health care facility    THYROID  NODULE 01/18/2010   Qualifier: Diagnosis of  By: Washington Hacker MD, Sean A    Trigger finger (acquired)    Unspecified disorder of thyroid     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 Hobart Lulas Spine   TONSILECTOMY, ADENOIDECTOMY, BILATERAL MYRINGOTOMY AND TUBES     TONSILLECTOMY     uterine polyp removal      Patient Active Problem List   Diagnosis Date Noted   Impacted cerumen of both ears 08/04/2023   Left foot drop 05/07/2023   Encounter for immunization 05/07/2023   Low vitamin D  level 05/07/2023   History of UTI 04/30/2023   Elevated LFTs 11/02/2022   Cat bite of forearm 10/29/2022   Spondylolisthesis of lumbar region 01/28/2020   Callus of foot 11/07/2017    Prediabetes 08/15/2017   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   Osteopenia 04/04/2015   Right foot pain 05/23/2013   Essential hypertension, benign 11/12/2010   Hypothyroidism 01/18/2010   Mixed hyperlipidemia 12/17/2009    PCP: Towanda Fret, MD  REFERRING PROVIDER: Daryel Ensign, DO  REFERRING DIAG: M21.371,M21.372 (ICD-10-CM) - Bilateral foot-drop  THERAPY DIAG:  Muscle weakness of lower extremity  Difficulty in walking, not elsewhere classified  Rationale for Evaluation and Treatment: Rehabilitation  ONSET DATE: Years ago  SUBJECTIVE:   SUBJECTIVE STATEMENT: Pt states she had food poisoning and has been a rough night.  Feeling much better now. No pain currently.  EVAL: Arrives to the clinic due to foot drop and and walking outside her feet. Patient is also concerned about her balance. Denies any numbness. Condition with the foot drop began when she had spinal stenosis. Patient then had back surgery in July 2021 which fixed the foot drop. Recently, 3-4 months ago, the issues on her feet were back. 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 Patient was then referred to outpatient PT evaluation and management.  PERTINENT HISTORY: CMTD, PLIF PAIN:  Are you having pain? No  PRECAUTIONS: Fall  RED FLAGS: None   WEIGHT BEARING RESTRICTIONS: No  FALLS:  Has patient fallen in last 6 months? Yes. Number of falls 1 (6 weeks to 2 months ago)  LIVING ENVIRONMENT: Lives with: lives with their spouse Lives in: House/apartment Stairs: No Has following equipment at home: Environmental consultant - 2 wheeled  OCCUPATION: retired  PLOF: Independent and Independent with basic ADLs  PATIENT GOALS: "to strengthen my legs and feet"  NEXT MD VISIT: February 2025  OBJECTIVE:  Note: Objective measures were completed at Evaluation unless otherwise noted.  DIAGNOSTIC FINDINGS:  06/27/23 MRI LUMBAR SPINE WITHOUT CONTRAST    TECHNIQUE: Multiplanar, multisequence MR imaging of the lumbar spine was performed. No intravenous contrast was administered.   COMPARISON:  Lumbar spine radiograph 01/28/2020 and 03/01/2021. Lumbar MRI 01/02/2020.   FINDINGS: Segmentation: Transitional lumbosacral anatomy. In keeping with the numbering applied to the previous MRI, there is a transitional, partially lumbarized S1 segment. This numbering assumes interval L4-5 PLIF and bilateral ribs at the L1 level.   Alignment: Stable grade 1 fused anterolisthesis at L4-5 and similar grade 1 anterolisthesis at L5-S1.   Vertebrae: No worrisome osseous lesion, acute fracture or pars defect within the lumbar spine. Sagittal images demonstrate marrow edema and a fracture of the mid to distal sacrum asymmetric to the left. Interval L4-5 posterior decompression and PLIF.   Conus medullaris: Extends to the L2 level and appears normal.   Paraspinal and other soft tissues: No significant paraspinal findings. Mild fatty atrophy within the erector spinae musculature inferiorly.   Disc levels:   Sagittal images demonstrate mild lower thoracic spondylosis without evidence of significant spinal stenosis or foraminal narrowing.   L1-2: Mild loss of disc height with a new small right paracentral disc extrusion and superior migration of a small disc fragment. No resulting mass effect on the distal thoracic cord or foraminal narrowing.   L2-3: Preserved disc height with stable mild disc bulging and a small central annular fissure. Mild bilateral facet hypertrophy. No significant spinal stenosis or nerve root encroachment.   L3-4: Adjacent segment changes with mildly progressive loss of disc height, annular disc bulging and moderate facet and ligamentous hypertrophy. Resulting progressive multifactorial spinal stenosis, now moderate. In addition, there is moderate lateral recess and mild foraminal narrowing bilaterally.   L4-5: Interval  posterior decompression and PLIF. The spinal canal and lateral recesses are well decompressed. Mild residual inferior foraminal narrowing bilaterally due to spur without L4 nerve root encroachment.   L5-S1: Preserved disc height with mild disc bulging and moderate facet hypertrophy. No spinal stenosis or nerve root encroachment.   IMPRESSION: 1. Transitional lumbosacral anatomy. In keeping with the numbering applied to the previous MRI, there is a transitional, partially lumbarized S1 segment. This numbering assumes interval L4-5 PLIF and bilateral ribs at the L1 level. 2. Acute fracture of the mid to distal sacrum asymmetric to the left. 3. Interval posterior decompression and PLIF at L4-5 with well decompressed spinal canal and lateral recesses. Mild residual inferior foraminal narrowing bilaterally without L4 nerve root encroachment. 4. Adjacent segment changes at L3-4 with progressive multifactorial spinal stenosis, now moderate. In addition, there is moderate lateral recess and mild foraminal narrowing bilaterally. 5. New small right paracentral disc extrusion and superior migration of a small disc fragment at L1-2 without resulting mass effect on the distal thoracic cord or foraminal narrowing. 6. No other significant changes or nerve root encroachment.  PATIENT SURVEYS:  ABC scale 1280/1800 = 80%  COGNITION: Overall cognitive status: Within functional limits  for tasks assessed     SENSATION: Light touch: WFL on B LE  MUSCLE LENGTH: Hamstrings: mild restriction on B Thomas test: moderate restriction on B Gastrocsoleus: moderate to severe restriction on B  POSTURE: rounded shoulders, forward head, increased thoracic kyphosis, and highly supinated feet, very slight genu varus  LOWER EXTREMITY ROM:  Active ROM Right eval Left eval Right 10/06/2023 Left 10/06/2023  Hip flexion [facility]    Hip extension [facility]    Hip abduction [facility]    Hip adduction      Hip  internal rotation      Hip external rotation      Knee flexion [facility]    Knee extension [facility] WFL    Ankle dorsiflexion 50-30 50-40 50-30 50-40  Ankle plantarflexion 50-56 50-56 50-56 50-56  Ankle inversion 20-50 10-40 20-50 10-40  Ankle eversion 20-10 10-8 20-10 10-8   (Blank rows = not tested)  LOWER EXTREMITY MMT:  MMT Right eval Left eval Right 10/06/2023 Left 10/06/2023  Hip flexion 4- 4-    Hip extension 4- 4-    Hip abduction 4- 4-    Hip adduction      Hip internal rotation      Hip external rotation      Knee flexion 4 4    Knee extension 4 4    Ankle dorsiflexion 2+ 2-    Ankle plantarflexion 3+ 3+    Ankle inversion 4 4    Ankle eversion 2- 2-     (Blank rows = not tested)   FUNCTIONAL TESTS:  5 times sit to stand: 9.59 sec 2 minute walk test: 349 ft Dynamic Gait Index: 14  DGI 07/26/23 1. Gait level surface (1) Moderate Impairment: Walks 20', slow speed, abnormal gait pattern, evidence for imbalance. 2. Change in gait speed (2) Mild Impairment: Is able to change speed but demonstrates mild gait deviations, or not gait deviations but unable to achieve a significant change in velocity, or uses an assistive device. 3. Gait with horizontal head turns (2) Mild Impairment: Performs head turns smoothly with slight change in gait velocity, i.e., minor disruption to smooth gait path or uses walking aid. 4. Gait with vertical head turns (2) Mild Impairment: Performs head turns smoothly with slight change in gait velocity, i.e., minor disruption to smooth gait path or uses walking aid. 5. Gait and pivot turn (2) Mild Impairment: Pivot turns safely in > 3 seconds and stops with no loss of balance. 6. Step over obstacle (1) Moderate Impairment: Is able to step over box but must stop, then step over. May require verbal cueing. 7. Step around obstacles (2) Mild Impairment: Is able to step around both cones, but must slow down and adjust steps to clear cones. 8.  Stairs (2) Mild Impairment: Alternating feet, must use rail.  TOTAL SCORE: 14 / 24  GAIT: Distance walked: 349 ft Assistive device utilized: None Level of assistance: Complete Independence Comments: done during , patient demonstrates foot slap, excessive hip flexion on preswing, inadequate hip ext on terminal swing  TREATMENT DATE:  10/30/2023  Standing:   Heel raises, 2 sets of 10 bilateral Vectors 3 way hip 1 UE assist only 2 sets 5 each LE with 5" holds BAPS board level 2; 2 set of 10 reps bilaterally PF/DF in // bars and opposite LE support on board  BAPS board level 2; 2 set of 10 reps bilaterally inv/ev in // bars and opposite LE support on board   Slant board gastroc stretch 3X30" Sit to stands with feet on blue foam 2 sets 5 reps with CGA and no UE assist  10/25/2023  Therapeutic Exercise: -Seated heel/toe raises, 2 sets of 10 bilateral -3 way hip on blue foam, 1 set of 5 reps each side, BUE support needed for balance, pt cued for keeping SLS limb stable during movement -BAPS board 1 set of 10 reps PF/DF in // bars and opposite LE support on board for completion, bilaterally   Neuromuscular Re-education: -Aeromat lateral walks and heel/toe walks, 3 laps each direction, pt cued for ankle control -Blue foam standing balance, 30 second bouts           -normal BOS           -narrow BOS           -Staggered stance, one foot on mat one foot on floor -Squats on upside down bosu ball with // bars, 2 set of 5 reps   10/19/23 Seated:  heel raise 20X  Toe raises 20X  Towel pull (windshield wipers) 2 minutes each foot Standing vectors 2 sets 5 reps 3-5" holds (Rt stance much more difficult) [place] ESTIM 10 minutes co-contraction Rt and Lt eversion   duty cycle 50%  Cycle time 10/20  Channel 1 Lt LE: , Channel 2: 43 mAcc  10/13/2023  -Recumbent  bike x5' -Russian ESTIM x 15' duty cycle 50%  Cycle time 10/20  Channel 1: 40 on LLE & Channel 2: 50 -Discussion regarding potential ankle braces, AFOs, and inlclusion of that in daily activities and starting referral process to MD.     10/06/2023  PROGRESS NOTE -DGI 1. Gait level surface (2) Mild Impairment: Walks 20', uses assistive devices, slower speed, mild gait deviations. 2. Change in gait speed (2) Mild Impairment: Is able to change speed but demonstrates mild gait deviations, or not gait deviations but unable to achieve a significant change in velocity, or uses an assistive device. 3. Gait with horizontal head turns (2) Mild Impairment: Performs head turns smoothly with slight change in gait velocity, i.e., minor disruption to smooth gait path or uses walking aid. 4. Gait with vertical head turns (2) Mild Impairment: Performs head turns smoothly with slight change in gait velocity, i.e., minor disruption to smooth gait path or uses walking aid. 5. Gait and pivot turn (2) Mild Impairment: Pivot turns safely in > 3 seconds and stops with no loss of balance. 6. Step over obstacle (1) Moderate Impairment: Is able to step over box but must stop, then step over. May require verbal cueing. 7. Step around obstacles (2) Mild Impairment: Is able to step around both cones, but must slow down and adjust steps to clear cones. 8. Stairs (1) Moderate Impairment: Two feet to a stair, must use rail.  TOTAL SCORE: 14 / 24 - -5x STS: 9 seconcds -ROM -MMT  PATIENT EDUCATION:  Education details: Educated on the pathoanatomy of pes cavus and foot drop. Educated on the goals and course of rehab. Educated on measures to reduce falls at home Person  educated: Patient Education method: Explanation Education comprehension: verbalized understanding  HOME EXERCISE PROGRAM: Access Code: [email or web address] URL: [email or web address] Date: 08/11/2023 Prepared by: Juline Ohara  Exercises - Seated Calf Stretch with Strap  - 1-2 x daily - 7 x weekly - 3 sets - 3 reps - 30 hold - Toe Yoga - Alternating Great Toe and Lesser Toe Extension  - 1-2 x daily - 7 x weekly - 2 sets - 10 reps - Ankle Inversion Eversion Towel Slide  - 1 x daily - 7 x weekly - 2 sets - 10 reps - Seated Hip Adduction Isometrics with Ball  - 1-2 x daily - 7 x weekly - 3 sets - 10 reps - 3 hold  ASSESSMENT:  CLINICAL IMPRESSION: Attempted to reduce UE support with stability challenges today but too difficult with foam surface.  Good challenge with vectors with ony 1 UE with cues to keep foot flat.  Noted tightenss with gastroc stretch today and encouraged to complete this more on her own in addition to other exercises she is completing.  Sit to stand challenging with CGA needed as unstable when standing during some reps.  Cues to slow descent to chair.   Pt overall expressed fatigue with therex/challenges added today and required several seated rest breaks throughout session.  Patient will continue to benefit from skilled physical therapy for increased endurance with ambulation, increased LE strength, and improved balance for improved quality of life, improved gait pattern and continued progress towards therapy goals.  EVAL: Patient is a 73 y.o. female who was seen today for physical therapy evaluation and treatment for B foot drop. Patient's condition is further defined by difficulty with walking due to weakness, impaired proprioception and decreased soft tissue extensibility. Skilled PT is required to address the impairments and functional limitations listed below. Notified PCP via Epic about findings of the MRI.    OBJECTIVE IMPAIRMENTS: Abnormal gait, decreased activity tolerance, decreased balance, difficulty walking, decreased ROM, decreased strength, impaired flexibility, and postural dysfunction.   ACTIVITY LIMITATIONS: standing, squatting, stairs, and transfers  PARTICIPATION  LIMITATIONS: meal prep, cleaning, laundry, driving, shopping, community activity, and yard work  PERSONAL FACTORS: Age, Time since onset of injury/illness/exacerbation, and 1-2 comorbidities: CMTD, PLIF  are also affecting patient's functional outcome.   REHAB POTENTIAL: Fair    CLINICAL DECISION MAKING: Evolving/moderate complexity  EVALUATION COMPLEXITY: Moderate   GOALS: Goals reviewed with patient? Yes  SHORT TERM GOALS: Target date: 08/09/23 Pt will demonstrate indep in HEP to facilitate carry-over of skilled services and improve functional outcomes Goal status: INITIAL  LONG TERM GOALS: Target date: 08/23/23  Pt will improve DGI by at least 3 points in order to demonstrate clinically significant improvement in balance and decreased risk for falls Baseline: 14 Goal status: INITIAL  2.  Pt will increase by at least 40 ft in order to demonstrate clinically significant improvement in community ambulation Baseline: 349 ft Goal status: INITIAL  3.  Pt will demonstrate increase in ankle ROM by 5-10 deg  to facilitate ease in ADLs  and ambulation Baseline: see above Goal status: INITIAL  4.  Pt will demonstrate increase in LE strength to 3+/5 to facilitate ease and safety in ambulation Baseline: 2-/5 Goal status: INITIAL  PLAN:  PT FREQUENCY: 2x/week  PT DURATION: 4 weeks  PLANNED INTERVENTIONS: 97164- PT Re-evaluation, 97110-Therapeutic exercises, 97530- Therapeutic activity, 97112- Neuromuscular re-education, 97535- Self Care, [id number]- Manual therapy, [id number]- Gait training, [phone]- Orthotic Fit/training, 97014- Electrical stimulation (unattended), [id number]- Electrical stimulation (manual),  and Patient/Family education  PLAN FOR NEXT SESSION:  continue to progress LE flexibility and strengthening with electrical stimulation for ankle evertors. Progress dynamic SLS activities with gait belt for safety to pt tolerance. Follow up with bioness [facility] as potential referral, (see  [place] Flor).  Lorenso Romance, PTA/CLT [facility] Ph: [phone]  10:24 AM, 10/30/23'

## 2023-10-31 ENCOUNTER — Encounter (HOSPITAL_COMMUNITY): Admitting: Physical Therapy

## 2023-11-02 ENCOUNTER — Ambulatory Visit (HOSPITAL_COMMUNITY)

## 2023-11-02 ENCOUNTER — Encounter (HOSPITAL_COMMUNITY): Payer: Self-pay

## 2023-11-02 ENCOUNTER — Ambulatory Visit: Payer: Medicare Other | Admitting: Family Medicine

## 2023-11-02 DIAGNOSIS — M6281 Muscle weakness (generalized): Secondary | ICD-10-CM | POA: Diagnosis not present

## 2023-11-02 DIAGNOSIS — R262 Difficulty in walking, not elsewhere classified: Secondary | ICD-10-CM | POA: Diagnosis not present

## 2023-11-02 NOTE — Therapy (Signed)
 OUTPATIENT PHYSICAL THERAPY LOWER EXTREMITY TREATMENT   Patient Name: Julie May MRN: [id number] DOB:[birth year], 73 y.o., female Today's Date: 11/02/2023  END OF SESSION:  PT End of Session - 11/02/23 1550     Visit Number 7    Number of Visits 8    Date for PT Re-Evaluation 11/17/23    Authorization Type Medicare Part A & B    Progress Note Due on Visit 10    PT Start Time 1435    PT Stop Time 1519    PT Time Calculation (min) 44 min    Activity Tolerance Patient tolerated treatment well    Behavior During Therapy [facility] for tasks assessed/performed                   Past Medical History:  Diagnosis Date   Bilateral impacted cerumen 04/10/2019   Callus of foot 11/07/2017   Foot pain, bilateral 05/23/2013   Hyperlipidemia    Hyperpotassemia    Hypertension    new dx 11/12/2010   Hypothyroidism    OA (osteoarthritis)    Other abnormal glucose    Overweight(278.02)    Polycythemia, secondary    Routine general medical examination at a health care facility    THYROID  NODULE 01/18/2010   Qualifier: Diagnosis of  By: Washington Hacker MD, Sean A    Trigger finger (acquired)    Unspecified disorder of thyroid     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 Hobart Lulas Spine   TONSILECTOMY, ADENOIDECTOMY, BILATERAL MYRINGOTOMY AND TUBES     TONSILLECTOMY     uterine polyp removal      Patient Active Problem List   Diagnosis Date Noted   Impacted cerumen of both ears 08/04/2023   Left foot drop 05/07/2023   Encounter for immunization 05/07/2023   Low vitamin D  level 05/07/2023   History of UTI 04/30/2023   Elevated LFTs 11/02/2022   Cat bite of forearm 10/29/2022   Spondylolisthesis of lumbar region 01/28/2020   Callus of foot  11/07/2017   Prediabetes 08/15/2017   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   Osteopenia 04/04/2015   Right foot pain 05/23/2013   Essential hypertension, benign 11/12/2010   Hypothyroidism 01/18/2010   Mixed hyperlipidemia 12/17/2009    PCP: Towanda Fret, MD  REFERRING PROVIDER: Daryel Ensign, DO  REFERRING DIAG: M21.371,M21.372 (ICD-10-CM) - Bilateral foot-drop  THERAPY DIAG:  Muscle weakness of lower extremity  Difficulty in walking, not elsewhere classified  Rationale for Evaluation and Treatment: Rehabilitation  ONSET DATE: Years ago  SUBJECTIVE:   SUBJECTIVE STATEMENT: Pt states she had food poisoning and has been a rough night.  Feeling much better now. No pain currently.  EVAL: Arrives to the clinic due to foot drop and and walking outside her feet. Patient is also concerned about her balance. Denies any numbness. Condition with the foot drop began when she had spinal stenosis. Patient then had back surgery in July 2021 which fixed the foot drop. Recently, 3-4 months ago, the issues on her feet were back. 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 Patient was then referred to outpatient PT evaluation and management.  PERTINENT HISTORY: CMTD, PLIF PAIN:  Are you having pain? No  PRECAUTIONS:  Fall  RED FLAGS: None   WEIGHT BEARING RESTRICTIONS: No  FALLS:  Has patient fallen in last 6 months? Yes. Number of falls 1 (6 weeks to 2 months ago)  LIVING ENVIRONMENT: Lives with: lives with their spouse Lives in: House/apartment Stairs: No Has following equipment at home: Environmental consultant - 2 wheeled  OCCUPATION: retired  PLOF: Independent and Independent with basic ADLs  PATIENT GOALS: "to strengthen my legs and feet"  NEXT MD VISIT: February 2025  OBJECTIVE:  Note: Objective measures were completed at Evaluation unless otherwise noted.  DIAGNOSTIC FINDINGS:  06/27/23 MRI LUMBAR SPINE WITHOUT CONTRAST    TECHNIQUE: Multiplanar, multisequence MR imaging of the lumbar spine was performed. No intravenous contrast was administered.   COMPARISON:  Lumbar spine radiograph 01/28/2020 and 03/01/2021. Lumbar MRI 01/02/2020.   FINDINGS: Segmentation: Transitional lumbosacral anatomy. In keeping with the numbering applied to the previous MRI, there is a transitional, partially lumbarized S1 segment. This numbering assumes interval L4-5 PLIF and bilateral ribs at the L1 level.   Alignment: Stable grade 1 fused anterolisthesis at L4-5 and similar grade 1 anterolisthesis at L5-S1.   Vertebrae: No worrisome osseous lesion, acute fracture or pars defect within the lumbar spine. Sagittal images demonstrate marrow edema and a fracture of the mid to distal sacrum asymmetric to the left. Interval L4-5 posterior decompression and PLIF.   Conus medullaris: Extends to the L2 level and appears normal.   Paraspinal and other soft tissues: No significant paraspinal findings. Mild fatty atrophy within the erector spinae musculature inferiorly.   Disc levels:   Sagittal images demonstrate mild lower thoracic spondylosis without evidence of significant spinal stenosis or foraminal narrowing.   L1-2: Mild loss of disc height with a new small right paracentral disc extrusion and superior migration of a small disc fragment. No resulting mass effect on the distal thoracic cord or foraminal narrowing.   L2-3: Preserved disc height with stable mild disc bulging and a small central annular fissure. Mild bilateral facet hypertrophy. No significant spinal stenosis or nerve root encroachment.   L3-4: Adjacent segment changes with mildly progressive loss of disc height, annular disc bulging and moderate facet and ligamentous hypertrophy. Resulting progressive multifactorial spinal stenosis, now moderate. In addition, there is moderate lateral recess and mild foraminal narrowing bilaterally.   L4-5: Interval  posterior decompression and PLIF. The spinal canal and lateral recesses are well decompressed. Mild residual inferior foraminal narrowing bilaterally due to spur without L4 nerve root encroachment.   L5-S1: Preserved disc height with mild disc bulging and moderate facet hypertrophy. No spinal stenosis or nerve root encroachment.   IMPRESSION: 1. Transitional lumbosacral anatomy. In keeping with the numbering applied to the previous MRI, there is a transitional, partially lumbarized S1 segment. This numbering assumes interval L4-5 PLIF and bilateral ribs at the L1 level. 2. Acute fracture of the mid to distal sacrum asymmetric to the left. 3. Interval posterior decompression and PLIF at L4-5 with well decompressed spinal canal and lateral recesses. Mild residual inferior foraminal narrowing bilaterally without L4 nerve root encroachment. 4. Adjacent segment changes at L3-4 with progressive multifactorial spinal stenosis, now moderate. In addition, there is moderate lateral recess and mild foraminal narrowing bilaterally. 5. New small right paracentral disc extrusion and superior migration of a small disc fragment at L1-2 without resulting mass effect on the distal thoracic cord or foraminal narrowing. 6. No other significant changes or nerve root encroachment.  PATIENT SURVEYS:  ABC scale 1280/1800 = 80%  COGNITION: Overall cognitive status: Within  functional limits for tasks assessed     SENSATION: Light touch: WFL on B LE  MUSCLE LENGTH: Hamstrings: mild restriction on B Thomas test: moderate restriction on B Gastrocsoleus: moderate to severe restriction on B  POSTURE: rounded shoulders, forward head, increased thoracic kyphosis, and highly supinated feet, very slight genu varus  LOWER EXTREMITY ROM:  Active ROM Right eval Left eval Right 10/06/2023 Left 10/06/2023  Hip flexion [facility]    Hip extension [facility]    Hip abduction [facility]    Hip adduction      Hip  internal rotation      Hip external rotation      Knee flexion [facility]    Knee extension [place] [facility]    Ankle dorsiflexion 50-30 50-40 50-30 50-40  Ankle plantarflexion 50-56 50-56 50-56 50-56  Ankle inversion 20-50 10-40 20-50 10-40  Ankle eversion 20-10 10-8 20-10 10-8   (Blank rows = not tested)  LOWER EXTREMITY MMT:  MMT Right eval Left eval Right 10/06/2023 Left 10/06/2023  Hip flexion 4- 4-    Hip extension 4- 4-    Hip abduction 4- 4-    Hip adduction      Hip internal rotation      Hip external rotation      Knee flexion 4 4    Knee extension 4 4    Ankle dorsiflexion 2+ 2-    Ankle plantarflexion 3+ 3+    Ankle inversion 4 4    Ankle eversion 2- 2-     (Blank rows = not tested)   FUNCTIONAL TESTS:  5 times sit to stand: 9.59 sec 2 minute walk test: 349 ft Dynamic Gait Index: 14  DGI 07/26/23 1. Gait level surface (1) Moderate Impairment: Walks 20', slow speed, abnormal gait pattern, evidence for imbalance. 2. Change in gait speed (2) Mild Impairment: Is able to change speed but demonstrates mild gait deviations, or not gait deviations but unable to achieve a significant change in velocity, or uses an assistive device. 3. Gait with horizontal head turns (2) Mild Impairment: Performs head turns smoothly with slight change in gait velocity, i.e., minor disruption to smooth gait path or uses walking aid. 4. Gait with vertical head turns (2) Mild Impairment: Performs head turns smoothly with slight change in gait velocity, i.e., minor disruption to smooth gait path or uses walking aid. 5. Gait and pivot turn (2) Mild Impairment: Pivot turns safely in > 3 seconds and stops with no loss of balance. 6. Step over obstacle (1) Moderate Impairment: Is able to step over box but must stop, then step over. May require verbal cueing. 7. Step around obstacles (2) Mild Impairment: Is able to step around both cones, but must slow down and adjust steps to clear cones. 8.  Stairs (2) Mild Impairment: Alternating feet, must use rail.  TOTAL SCORE: 14 / 24  GAIT: Distance walked: 349 ft Assistive device utilized: None Level of assistance: Complete Independence Comments: done during , patient demonstrates foot slap, excessive hip flexion on preswing, inadequate hip ext on terminal swing  TREATMENT DATE:  11/02/2023  -Standing clamshell 1' bilaterally- terminated due to pt reporting knee pain with activity -Standing 6in stepdown with BUE support // bars 4 x 1' bilaterally  Neuro-muscular Re-education: -k-tape starting at navicular and applying laterally to fibular head -practiced ambulation forward and backwards with verbal cues and use of KT tape for tactile cues and increased proprioception of foot placement during stance pahse  10/30/2023  Standing:   Heel raises, 2 sets of 10 bilateral Vectors 3 way hip 1 UE assist only 2 sets 5 each LE with 5" holds BAPS board level 2; 2 set of 10 reps bilaterally PF/DF in // bars and opposite LE support on board  BAPS board level 2; 2 set of 10 reps bilaterally inv/ev in // bars and opposite LE support on board   Slant board gastroc stretch 3X30" Sit to stands with feet on blue foam 2 sets 5 reps with CGA and no UE assist  10/25/2023  Therapeutic Exercise: -Seated heel/toe raises, 2 sets of 10 bilateral -3 way hip on blue foam, 1 set of 5 reps each side, BUE support needed for balance, pt cued for keeping SLS limb stable during movement -BAPS board 1 set of 10 reps PF/DF in // bars and opposite LE support on board for completion, bilaterally   Neuromuscular Re-education: -Aeromat lateral walks and heel/toe walks, 3 laps each direction, pt cued for ankle control -Blue foam standing balance, 30 second bouts           -normal BOS           -narrow BOS           -Staggered stance, one  foot on mat one foot on floor -Squats on upside down bosu ball with // bars, 2 set of 5 reps     PATIENT EDUCATION:  Education details: Educated on the pathoanatomy of pes cavus and foot drop. Educated on the goals and course of rehab. Educated on measures to reduce falls at home Person educated: Patient Education method: Explanation Education comprehension: verbalized understanding  HOME EXERCISE PROGRAM: Access Code: [email or web address] URL: [email or web address] Date: 08/11/2023 Prepared by: Juline Ohara  Exercises - Seated Calf Stretch with Strap  - 1-2 x daily - 7 x weekly - 3 sets - 3 reps - 30 hold - Toe Yoga - Alternating Great Toe and Lesser Toe Extension  - 1-2 x daily - 7 x weekly - 2 sets - 10 reps - Ankle Inversion Eversion Towel Slide  - 1 x daily - 7 x weekly - 2 sets - 10 reps - Seated Hip Adduction Isometrics with Ball  - 1-2 x daily - 7 x weekly - 3 sets - 10 reps - 3 hold  ASSESSMENT:  CLINICAL IMPRESSION: Pt tolerating treatment session well. Pt showed referral for Orthotic evaluation and said she will get an appointment set up. Pt tolerating gluteal strengthening today and new addition of neuro-muscular re-education intervention with KT tape for increased proprioceptive input to peroneals. Continues to ambulate with inverted position which is limiting her balance reactions. Patient will continue to benefit from skilled physical therapy for increased endurance with ambulation, increased LE strength, and improved balance for improved quality of life, improved gait pattern and continued progress towards therapy goals.  EVAL: Patient is a 73 y.o. female who was seen today for physical therapy evaluation and treatment for B foot drop. Patient's condition is further defined by difficulty with walking due to weakness, impaired proprioception and decreased soft tissue extensibility. Skilled  PT is required to address the impairments and functional limitations listed  below. Notified PCP via Epic about findings of the MRI.    OBJECTIVE IMPAIRMENTS: Abnormal gait, decreased activity tolerance, decreased balance, difficulty walking, decreased ROM, decreased strength, impaired flexibility, and postural dysfunction.   ACTIVITY LIMITATIONS: standing, squatting, stairs, and transfers  PARTICIPATION LIMITATIONS: meal prep, cleaning, laundry, driving, shopping, community activity, and yard work  PERSONAL FACTORS: Age, Time since onset of injury/illness/exacerbation, and 1-2 comorbidities: CMTD, PLIF  are also affecting patient's functional outcome.   REHAB POTENTIAL: Fair    CLINICAL DECISION MAKING: Evolving/moderate complexity  EVALUATION COMPLEXITY: Moderate   GOALS: Goals reviewed with patient? Yes  SHORT TERM GOALS: Target date: 08/09/23 Pt will demonstrate indep in HEP to facilitate carry-over of skilled services and improve functional outcomes Goal status: INITIAL  LONG TERM GOALS: Target date: 08/23/23  Pt will improve DGI by at least 3 points in order to demonstrate clinically significant improvement in balance and decreased risk for falls Baseline: 14 Goal status: INITIAL  2.  Pt will increase by at least 40 ft in order to demonstrate clinically significant improvement in community ambulation Baseline: 349 ft Goal status: INITIAL  3.  Pt will demonstrate increase in ankle ROM by 5-10 deg  to facilitate ease in ADLs  and ambulation Baseline: see above Goal status: INITIAL  4.  Pt will demonstrate increase in LE strength to 3+/5 to facilitate ease and safety in ambulation Baseline: 2-/5 Goal status: INITIAL  PLAN:  PT FREQUENCY: 2x/week  PT DURATION: 4 weeks  PLANNED INTERVENTIONS: 97164- PT Re-evaluation, 97110-Therapeutic exercises, 97530- Therapeutic activity, 97112- Neuromuscular re-education, 97535- Self Care, 82956- Manual therapy, [id number]- Gait training, [phone]- Orthotic Fit/training, 97014- Electrical stimulation  (unattended), [phone]- Electrical stimulation (manual), and Patient/Family education  PLAN FOR NEXT SESSION:  continue to progress LE flexibility and strengthening with electrical stimulation for ankle evertors. Progress dynamic SLS activities with gait belt for safety to pt tolerance. Follow up with bioness [facility] as potential referral, (see Derwood Flor).  Gatha Kaska PT, DPT [facility] [phone] office  3:50 PM, 11/02/23'

## 2023-11-06 ENCOUNTER — Encounter (HOSPITAL_COMMUNITY): Payer: Self-pay

## 2023-11-06 ENCOUNTER — Ambulatory Visit (HOSPITAL_COMMUNITY)

## 2023-11-06 DIAGNOSIS — R262 Difficulty in walking, not elsewhere classified: Secondary | ICD-10-CM

## 2023-11-06 DIAGNOSIS — M6281 Muscle weakness (generalized): Secondary | ICD-10-CM | POA: Diagnosis not present

## 2023-11-06 NOTE — Therapy (Signed)
 +  OUTPATIENT PHYSICAL THERAPY LOWER EXTREMITY TREATMENT   Patient Name: Julie May MRN: [id number] DOB:Mar 18, 1951, 73 y.o., female Today's Date: 11/06/2023  END OF SESSION:  PT End of Session - 11/06/23 1141     Visit Number 8    Date for PT Re-Evaluation 11/17/23    Authorization Type Medicare Part A & B    Progress Note Due on Visit 10    PT Start Time 1105    PT Stop Time 1141    PT Time Calculation (min) 36 min    Equipment Utilized During Treatment Gait belt    Activity Tolerance Patient tolerated treatment well    Behavior During Therapy WFL for tasks assessed/performed                    Past Medical History:  Diagnosis Date   Bilateral impacted cerumen 04/10/2019   Callus of foot 11/07/2017   Foot pain, bilateral 05/23/2013   Hyperlipidemia    Hyperpotassemia    Hypertension    new dx 11/12/2010   Hypothyroidism    OA (osteoarthritis)    Other abnormal glucose    Overweight(278.02)    Polycythemia, secondary    Routine general medical examination at a health care facility    THYROID  NODULE 01/18/2010   Qualifier: Diagnosis of  By: Washington Hacker MD, Sean A    Trigger finger (acquired)    Unspecified disorder of thyroid     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 Hobart Lulas Spine   TONSILECTOMY, ADENOIDECTOMY, BILATERAL MYRINGOTOMY AND TUBES     TONSILLECTOMY     uterine polyp removal      Patient Active Problem List   Diagnosis Date Noted   Impacted cerumen of both ears 08/04/2023   Left foot drop 05/07/2023   Encounter for immunization 05/07/2023   Low vitamin D  level 05/07/2023   History of UTI 04/30/2023   Elevated LFTs 11/02/2022   Cat bite of forearm 10/29/2022   Spondylolisthesis of lumbar region  01/28/2020   Callus of foot 11/07/2017   Prediabetes 08/15/2017   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   Osteopenia 04/04/2015   Right foot pain 05/23/2013   Essential hypertension, benign 11/12/2010   Hypothyroidism 01/18/2010   Mixed hyperlipidemia 12/17/2009    PCP: Towanda Fret, MD  REFERRING PROVIDER: Daryel Ensign, DO  REFERRING DIAG: M21.371,M21.372 (ICD-10-CM) - Bilateral foot-drop  THERAPY DIAG:  Muscle weakness of lower extremity  Difficulty in walking, not elsewhere classified  Rationale for Evaluation and Treatment: Rehabilitation  ONSET DATE: Years ago  SUBJECTIVE:   SUBJECTIVE STATEMENT: Pt states she had food poisoning and has been a rough night.  Feeling much better now. No pain currently.  EVAL: Arrives to the clinic due to foot drop and and walking outside her feet. Patient is also concerned about her balance. Denies any numbness. Condition with the foot drop began when she had spinal stenosis. Patient then had back surgery in July 2021 which fixed the foot drop. Recently, 3-4 months ago, the issues on her feet were back. 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 Patient was then referred to outpatient PT evaluation and management.  PERTINENT HISTORY: CMTD, PLIF PAIN:  Are you  having pain? No  PRECAUTIONS: Fall  RED FLAGS: None   WEIGHT BEARING RESTRICTIONS: No  FALLS:  Has patient fallen in last 6 months? Yes. Number of falls 1 (6 weeks to 2 months ago)  LIVING ENVIRONMENT: Lives with: lives with their spouse Lives in: House/apartment Stairs: No Has following equipment at home: Environmental consultant - 2 wheeled  OCCUPATION: retired  PLOF: Independent and Independent with basic ADLs  PATIENT GOALS: "to strengthen my legs and feet"  NEXT MD VISIT: February 2025  OBJECTIVE:  Note: Objective measures were completed at Evaluation unless otherwise noted.  DIAGNOSTIC FINDINGS:  06/27/23 MRI LUMBAR  SPINE WITHOUT CONTRAST   TECHNIQUE: Multiplanar, multisequence MR imaging of the lumbar spine was performed. No intravenous contrast was administered.   COMPARISON:  Lumbar spine radiograph 01/28/2020 and 03/01/2021. Lumbar MRI 01/02/2020.   FINDINGS: Segmentation: Transitional lumbosacral anatomy. In keeping with the numbering applied to the previous MRI, there is a transitional, partially lumbarized S1 segment. This numbering assumes interval L4-5 PLIF and bilateral ribs at the L1 level.   Alignment: Stable grade 1 fused anterolisthesis at L4-5 and similar grade 1 anterolisthesis at L5-S1.   Vertebrae: No worrisome osseous lesion, acute fracture or pars defect within the lumbar spine. Sagittal images demonstrate marrow edema and a fracture of the mid to distal sacrum asymmetric to the left. Interval L4-5 posterior decompression and PLIF.   Conus medullaris: Extends to the L2 level and appears normal.   Paraspinal and other soft tissues: No significant paraspinal findings. Mild fatty atrophy within the erector spinae musculature inferiorly.   Disc levels:   Sagittal images demonstrate mild lower thoracic spondylosis without evidence of significant spinal stenosis or foraminal narrowing.   L1-2: Mild loss of disc height with a new small right paracentral disc extrusion and superior migration of a small disc fragment. No resulting mass effect on the distal thoracic cord or foraminal narrowing.   L2-3: Preserved disc height with stable mild disc bulging and a small central annular fissure. Mild bilateral facet hypertrophy. No significant spinal stenosis or nerve root encroachment.   L3-4: Adjacent segment changes with mildly progressive loss of disc height, annular disc bulging and moderate facet and ligamentous hypertrophy. Resulting progressive multifactorial spinal stenosis, now moderate. In addition, there is moderate lateral recess and mild foraminal narrowing  bilaterally.   L4-5: Interval posterior decompression and PLIF. The spinal canal and lateral recesses are well decompressed. Mild residual inferior foraminal narrowing bilaterally due to spur without L4 nerve root encroachment.   L5-S1: Preserved disc height with mild disc bulging and moderate facet hypertrophy. No spinal stenosis or nerve root encroachment.   IMPRESSION: 1. Transitional lumbosacral anatomy. In keeping with the numbering applied to the previous MRI, there is a transitional, partially lumbarized S1 segment. This numbering assumes interval L4-5 PLIF and bilateral ribs at the L1 level. 2. Acute fracture of the mid to distal sacrum asymmetric to the left. 3. Interval posterior decompression and PLIF at L4-5 with well decompressed spinal canal and lateral recesses. Mild residual inferior foraminal narrowing bilaterally without L4 nerve root encroachment. 4. Adjacent segment changes at L3-4 with progressive multifactorial spinal stenosis, now moderate. In addition, there is moderate lateral recess and mild foraminal narrowing bilaterally. 5. New small right paracentral disc extrusion and superior migration of a small disc fragment at L1-2 without resulting mass effect on the distal thoracic cord or foraminal narrowing. 6. No other significant changes or nerve root encroachment.  PATIENT SURVEYS:  ABC scale 1280/1800 = 80%  COGNITION: Overall cognitive status: Within functional limits for tasks assessed     SENSATION: Light touch: WFL on B LE  MUSCLE LENGTH: Hamstrings: mild restriction on B Thomas test: moderate restriction on B Gastrocsoleus: moderate to severe restriction on B  POSTURE: rounded shoulders, forward head, increased thoracic kyphosis, and highly supinated feet, very slight genu varus  LOWER EXTREMITY ROM:  Active ROM Right eval Left eval Right 10/06/2023 Left 10/06/2023  Hip flexion [facility]    Hip extension [facility]    Hip abduction [facility]     Hip adduction      Hip internal rotation      Hip external rotation      Knee flexion [facility]    Knee extension [facility] WFL    Ankle dorsiflexion 50-30 50-40 50-30 50-40  Ankle plantarflexion 50-56 50-56 50-56 50-56  Ankle inversion 20-50 10-40 20-50 10-40  Ankle eversion 20-10 10-8 20-10 10-8   (Blank rows = not tested)  LOWER EXTREMITY MMT:  MMT Right eval Left eval Right 10/06/2023 Left 10/06/2023  Hip flexion 4- 4-    Hip extension 4- 4-    Hip abduction 4- 4-    Hip adduction      Hip internal rotation      Hip external rotation      Knee flexion 4 4    Knee extension 4 4    Ankle dorsiflexion 2+ 2-    Ankle plantarflexion 3+ 3+    Ankle inversion 4 4    Ankle eversion 2- 2-     (Blank rows = not tested)   FUNCTIONAL TESTS:  5 times sit to stand: 9.59 sec 2 minute walk test: 349 ft Dynamic Gait Index: 14  DGI 07/26/23 1. Gait level surface (1) Moderate Impairment: Walks 20', slow speed, abnormal gait pattern, evidence for imbalance. 2. Change in gait speed (2) Mild Impairment: Is able to change speed but demonstrates mild gait deviations, or not gait deviations but unable to achieve a significant change in velocity, or uses an assistive device. 3. Gait with horizontal head turns (2) Mild Impairment: Performs head turns smoothly with slight change in gait velocity, i.e., minor disruption to smooth gait path or uses walking aid. 4. Gait with vertical head turns (2) Mild Impairment: Performs head turns smoothly with slight change in gait velocity, i.e., minor disruption to smooth gait path or uses walking aid. 5. Gait and pivot turn (2) Mild Impairment: Pivot turns safely in > 3 seconds and stops with no loss of balance. 6. Step over obstacle (1) Moderate Impairment: Is able to step over box but must stop, then step over. May require verbal cueing. 7. Step around obstacles (2) Mild Impairment: Is able to step around both cones, but must slow down and adjust steps to  clear cones. 8. Stairs (2) Mild Impairment: Alternating feet, must use rail.  TOTAL SCORE: 14 / 24  GAIT: Distance walked: 349 ft Assistive device utilized: None Level of assistance: Complete Independence Comments: done during , patient demonstrates foot slap, excessive hip flexion on preswing, inadequate hip ext on terminal swing  TREATMENT DATE:  11/06/2023  -Neuro-muscular Re-education: -k-tape starting at navicular and applying laterally to fibular head + tan KT tape at first metatarsal and wrapping laterally <>medially into posterior tibialis tendon - Tandem stance balance 1 x 1' bialterally with UE support on // bars  -Heel elevated and pushed into eversion with 2.5lb plate with functional squats x 12 in // bars   11/02/2023  -Standing clamshell 1' bilaterally- terminated due to pt reporting knee pain with activity -Standing 6in stepdown with BUE support // bars 4 x 1' bilaterally  Neuro-muscular Re-education: -k-tape starting at navicular and applying laterally to fibular head -practiced ambulation forward and backwards with verbal cues and use of KT tape for tactile cues and increased proprioception of foot placement during stance pahse  10/30/2023  Standing:   Heel raises, 2 sets of 10 bilateral Vectors 3 way hip 1 UE assist only 2 sets 5 each LE with 5" holds BAPS board level 2; 2 set of 10 reps bilaterally PF/DF in // bars and opposite LE support on board  BAPS board level 2; 2 set of 10 reps bilaterally inv/ev in // bars and opposite LE support on board   Slant board gastroc stretch 3X30" Sit to stands with feet on blue foam 2 sets 5 reps with CGA and no UE assist  10/25/2023  Therapeutic Exercise: -Seated heel/toe raises, 2 sets of 10 bilateral -3 way hip on blue foam, 1 set of 5 reps each side, BUE support needed for balance, pt cued for  keeping SLS limb stable during movement -BAPS board 1 set of 10 reps PF/DF in // bars and opposite LE support on board for completion, bilaterally   Neuromuscular Re-education: -Aeromat lateral walks and heel/toe walks, 3 laps each direction, pt cued for ankle control -Blue foam standing balance, 30 second bouts           -normal BOS           -narrow BOS           -Staggered stance, one foot on mat one foot on floor -Squats on upside down bosu ball with // bars, 2 set of 5 reps     PATIENT EDUCATION:  Education details: Educated on the pathoanatomy of pes cavus and foot drop. Educated on the goals and course of rehab. Educated on measures to reduce falls at home Person educated: Patient Education method: Explanation Education comprehension: verbalized understanding  HOME EXERCISE PROGRAM: Access Code: [email or web address] URL: [email or web address] Date: 08/11/2023 Prepared by: Juline Ohara  Exercises - Seated Calf Stretch with Strap  - 1-2 x daily - 7 x weekly - 3 sets - 3 reps - 30 hold - Toe Yoga - Alternating Great Toe and Lesser Toe Extension  - 1-2 x daily - 7 x weekly - 2 sets - 10 reps - Ankle Inversion Eversion Towel Slide  - 1 x daily - 7 x weekly - 2 sets - 10 reps - Seated Hip Adduction Isometrics with Ball  - 1-2 x daily - 7 x weekly - 3 sets - 10 reps - 3 hold  ASSESSMENT:  CLINICAL IMPRESSION: Pt tolerating treatment session well. Progress note to be completed next session. More KT completd with focus on neuromuscular re-education in closed chain positions, pt tolerating all treatments well. Pt will discuss with Neurologist in June regarding functional E-sttim. Patient will continue to benefit from skilled physical therapy for increased endurance with ambulation, increased LE strength, and improved balance for improved quality of life, improved gait  pattern and continued progress towards therapy goals.  EVAL: Patient is a 73 y.o. female who was seen today for  physical therapy evaluation and treatment for B foot drop. Patient's condition is further defined by difficulty with walking due to weakness, impaired proprioception and decreased soft tissue extensibility. Skilled PT is required to address the impairments and functional limitations listed below. Notified PCP via Epic about findings of the MRI.    OBJECTIVE IMPAIRMENTS: Abnormal gait, decreased activity tolerance, decreased balance, difficulty walking, decreased ROM, decreased strength, impaired flexibility, and postural dysfunction.   ACTIVITY LIMITATIONS: standing, squatting, stairs, and transfers  PARTICIPATION LIMITATIONS: meal prep, cleaning, laundry, driving, shopping, community activity, and yard work  PERSONAL FACTORS: Age, Time since onset of injury/illness/exacerbation, and 1-2 comorbidities: CMTD, PLIF  are also affecting patient's functional outcome.   REHAB POTENTIAL: Fair    CLINICAL DECISION MAKING: Evolving/moderate complexity  EVALUATION COMPLEXITY: Moderate   GOALS: Goals reviewed with patient? Yes  SHORT TERM GOALS: Target date: 08/09/23 Pt will demonstrate indep in HEP to facilitate carry-over of skilled services and improve functional outcomes Goal status: INITIAL  LONG TERM GOALS: Target date: 08/23/23  Pt will improve DGI by at least 3 points in order to demonstrate clinically significant improvement in balance and decreased risk for falls Baseline: 14 Goal status: INITIAL  2.  Pt will increase by at least 40 ft in order to demonstrate clinically significant improvement in community ambulation Baseline: 349 ft Goal status: INITIAL  3.  Pt will demonstrate increase in ankle ROM by 5-10 deg  to facilitate ease in ADLs  and ambulation Baseline: see above Goal status: INITIAL  4.  Pt will demonstrate increase in LE strength to 3+/5 to facilitate ease and safety in ambulation Baseline: 2-/5 Goal status: INITIAL  PLAN:  PT FREQUENCY: 2x/week  PT  DURATION: 4 weeks  PLANNED INTERVENTIONS: 97164- PT Re-evaluation, 97110-Therapeutic exercises, 97530- Therapeutic activity, 97112- Neuromuscular re-education, 97535- Self Care, 16109- Manual therapy, [id number]- Gait training, [phone]- Orthotic Fit/training, 97014- Electrical stimulation (unattended), [phone]- Electrical stimulation (manual), and Patient/Family education  PLAN FOR NEXT SESSION:  continue to progress LE flexibility and strengthening with electrical stimulation for ankle evertors. Progress dynamic SLS activities with gait belt for safety to pt tolerance. Follow up with bioness [facility] as potential referral, (see Derwood Flor).  Gatha Kaska PT, DPT [facility] [phone] office  11:43 AM, 11/06/23'

## 2023-11-08 ENCOUNTER — Encounter: Payer: Self-pay | Admitting: "Endocrinology

## 2023-11-08 ENCOUNTER — Other Ambulatory Visit (HOSPITAL_COMMUNITY): Payer: Self-pay

## 2023-11-09 ENCOUNTER — Encounter (HOSPITAL_COMMUNITY): Payer: Self-pay

## 2023-11-09 ENCOUNTER — Ambulatory Visit (HOSPITAL_COMMUNITY): Attending: Neurology

## 2023-11-09 ENCOUNTER — Other Ambulatory Visit: Payer: Self-pay

## 2023-11-09 ENCOUNTER — Other Ambulatory Visit (HOSPITAL_COMMUNITY): Payer: Self-pay

## 2023-11-09 DIAGNOSIS — M6281 Muscle weakness (generalized): Secondary | ICD-10-CM | POA: Diagnosis not present

## 2023-11-09 DIAGNOSIS — R262 Difficulty in walking, not elsewhere classified: Secondary | ICD-10-CM | POA: Insufficient documentation

## 2023-11-09 NOTE — Therapy (Addendum)
 +  OUTPATIENT PHYSICAL THERAPY LOWER EXTREMITY TREATMENT/PROGRESS-NOTE/DISCHARGE Progress Note Reporting Period 10/06/23 to 11/17/23  See note below for Objective Data and Assessment of Progress/Goals.  PHYSICAL THERAPY DISCHARGE SUMMARY  Visits from Start of Care: 9  Current functional level related to goals / functional outcomes: See below   Remaining deficits: See below   Education / Equipment: Initiate orthotic referral Reach out to PCP/neurologist for further referrals regarding functional NMES Continue HEP at home  Patient agrees to discharge. Patient goals were not met. Patient is being discharged due to lack of progress.      Patient Name: Julie May MRN: [id number] DOB:Feb 08, 1951, 73 y.o., female Today's Date: 11/10/2023  END OF SESSION:  PT End of Session - 11/10/23 2145     Visit Number 9    Number of Visits 9    Date for PT Re-Evaluation 11/17/23    Authorization Type Medicare Part A & B    Progress Note Due on Visit 9    PT Start Time 1150    PT Stop Time 1222    PT Time Calculation (min) 32 min    Equipment Utilized During Treatment Gait belt    Activity Tolerance Patient tolerated treatment well    Behavior During Therapy WFL for tasks assessed/performed                     Past Medical History:  Diagnosis Date   Bilateral impacted cerumen 04/10/2019   Callus of foot 11/07/2017   Foot pain, bilateral 05/23/2013   Hyperlipidemia    Hyperpotassemia    Hypertension    new dx 11/12/2010   Hypothyroidism    OA (osteoarthritis)    Other abnormal glucose    Overweight(278.02)    Polycythemia, secondary    Routine general medical examination at a health care facility    THYROID  NODULE 01/18/2010   Qualifier: Diagnosis of  By: Washington Hacker MD, Sean A    Trigger finger (acquired)    Unspecified disorder of thyroid     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 Hobart Lulas Spine   TONSILECTOMY, ADENOIDECTOMY, BILATERAL MYRINGOTOMY AND TUBES     TONSILLECTOMY     uterine polyp removal      Patient Active Problem List   Diagnosis Date Noted   Impacted cerumen of both ears 08/04/2023   Left foot drop 05/07/2023   Encounter for immunization 05/07/2023   Low vitamin D  level 05/07/2023   History of UTI 04/30/2023   Elevated LFTs 11/02/2022   Cat bite of forearm 10/29/2022   Spondylolisthesis of lumbar region 01/28/2020   Callus of foot 11/07/2017   Prediabetes 08/15/2017   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   Osteopenia 04/04/2015   Right foot pain 05/23/2013   Essential hypertension, benign 11/12/2010   Hypothyroidism 01/18/2010   Mixed hyperlipidemia 12/17/2009    PCP: Towanda Fret, MD  REFERRING PROVIDER: Daryel Ensign, DO  REFERRING DIAG: M21.371,M21.372 (ICD-10-CM) - Bilateral foot-drop  THERAPY DIAG:  Muscle weakness of lower extremity  Difficulty in walking, not elsewhere classified  Rationale for Evaluation and Treatment: Rehabilitation  ONSET DATE: Years ago  SUBJECTIVE:   SUBJECTIVE STATEMENT: Pt reporting that she still notices her balance as primary issues and does not  feel that therapy is helping with her ankles. Pt reports that the KT tape fell off and her husband applied more.   EVAL: Arrives to the clinic due to foot drop and and walking outside her feet. Patient is also concerned about her balance. Denies any numbness. Condition with the foot drop began when she had spinal stenosis. Patient then had back surgery in July 2021 which fixed the foot drop. Recently, 3-4 months ago, the issues on her feet were back. 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 Patient was then referred to outpatient PT evaluation  and management.  PERTINENT HISTORY: CMTD, PLIF PAIN:  Are you having pain? No  PRECAUTIONS: Fall  RED FLAGS: None   WEIGHT BEARING RESTRICTIONS: No  FALLS:  Has patient fallen in last 6 months? Yes. Number of falls 1 (6 weeks to 2 months ago)  LIVING ENVIRONMENT: Lives with: lives with their spouse Lives in: House/apartment Stairs: No Has following equipment at home: Environmental consultant - 2 wheeled  OCCUPATION: retired  PLOF: Independent and Independent with basic ADLs  PATIENT GOALS: "to strengthen my legs and feet"  NEXT MD VISIT: February 2025  OBJECTIVE:  Note: Objective measures were completed at Evaluation unless otherwise noted.  DIAGNOSTIC FINDINGS:  06/27/23 MRI LUMBAR SPINE WITHOUT CONTRAST   TECHNIQUE: Multiplanar, multisequence MR imaging of the lumbar spine was performed. No intravenous contrast was administered.   COMPARISON:  Lumbar spine radiograph 01/28/2020 and 03/01/2021. Lumbar MRI 01/02/2020.   FINDINGS: Segmentation: Transitional lumbosacral anatomy. In keeping with the numbering applied to the previous MRI, there is a transitional, partially lumbarized S1 segment. This numbering assumes interval L4-5 PLIF and bilateral ribs at the L1 level.   Alignment: Stable grade 1 fused anterolisthesis at L4-5 and similar grade 1 anterolisthesis at L5-S1.   Vertebrae: No worrisome osseous lesion, acute fracture or pars defect within the lumbar spine. Sagittal images demonstrate marrow edema and a fracture of the mid to distal sacrum asymmetric to the left. Interval L4-5 posterior decompression and PLIF.   Conus medullaris: Extends to the L2 level and appears normal.   Paraspinal and other soft tissues: No significant paraspinal findings. Mild fatty atrophy within the erector spinae musculature inferiorly.   Disc levels:   Sagittal images demonstrate mild lower thoracic spondylosis without evidence of significant spinal stenosis or foraminal  narrowing.   L1-2: Mild loss of disc height with a new small right paracentral disc extrusion and superior migration of a small disc fragment. No resulting mass effect on the distal thoracic cord or foraminal narrowing.   L2-3: Preserved disc height with stable mild disc bulging and a small central annular fissure. Mild bilateral facet hypertrophy. No significant spinal stenosis or nerve root encroachment.   L3-4: Adjacent segment changes with mildly progressive loss of disc height, annular disc bulging and moderate facet and ligamentous hypertrophy. Resulting progressive multifactorial spinal stenosis, now moderate. In addition, there is moderate lateral recess and mild foraminal narrowing bilaterally.   L4-5: Interval posterior decompression and PLIF. The spinal canal and lateral recesses are well decompressed. Mild residual inferior foraminal narrowing bilaterally due to spur without L4 nerve root encroachment.   L5-S1: Preserved disc height with mild disc bulging and moderate facet hypertrophy. No spinal stenosis or nerve root encroachment.   IMPRESSION: 1. Transitional lumbosacral anatomy. In keeping with the numbering applied to the previous MRI, there is a transitional, partially lumbarized S1 segment. This numbering assumes interval L4-5 PLIF and bilateral ribs at  the L1 level. 2. Acute fracture of the mid to distal sacrum asymmetric to the left. 3. Interval posterior decompression and PLIF at L4-5 with well decompressed spinal canal and lateral recesses. Mild residual inferior foraminal narrowing bilaterally without L4 nerve root encroachment. 4. Adjacent segment changes at L3-4 with progressive multifactorial spinal stenosis, now moderate. In addition, there is moderate lateral recess and mild foraminal narrowing bilaterally. 5. New small right paracentral disc extrusion and superior migration of a small disc fragment at L1-2 without resulting mass effect on the  distal thoracic cord or foraminal narrowing. 6. No other significant changes or nerve root encroachment.  PATIENT SURVEYS:  ABC scale 1280/1800 = 80%  COGNITION: Overall cognitive status: Within functional limits for tasks assessed     SENSATION: Light touch: WFL on B LE  MUSCLE LENGTH: Hamstrings: mild restriction on B Thomas test: moderate restriction on B Gastrocsoleus: moderate to severe restriction on B  POSTURE: rounded shoulders, forward head, increased thoracic kyphosis, and highly supinated feet, very slight genu varus  LOWER EXTREMITY ROM:  Active ROM Right eval Left eval Right 10/06/2023 Left 10/06/2023  Hip flexion [facility]    Hip extension [facility]    Hip abduction [facility]    Hip adduction      Hip internal rotation      Hip external rotation      Knee flexion [facility]    Knee extension [facility] WFL    Ankle dorsiflexion 50-30 50-40 50-30 50-40  Ankle plantarflexion 50-56 50-56 50-56 50-56  Ankle inversion 20-50 10-40 20-50 10-40  Ankle eversion 20-10 10-8 20-10 10-8   (Blank rows = not tested)  LOWER EXTREMITY MMT:  MMT Right eval Left eval Right 10/06/2023 Left 10/06/2023  Hip flexion 4- 4-    Hip extension 4- 4-    Hip abduction 4- 4-    Hip adduction      Hip internal rotation      Hip external rotation      Knee flexion 4 4    Knee extension 4 4    Ankle dorsiflexion 2+ 2-    Ankle plantarflexion 3+ 3+    Ankle inversion 4 4    Ankle eversion 2- 2-     (Blank rows = not tested)   FUNCTIONAL TESTS:  5 times sit to stand: 9.59 sec 2 minute walk test: 349 ft Dynamic Gait Index: 14  DGI 07/26/23 1. Gait level surface (1) Moderate Impairment: Walks 20', slow speed, abnormal gait pattern, evidence for imbalance. 2. Change in gait speed (2) Mild Impairment: Is able to change speed but demonstrates mild gait deviations, or not gait deviations but unable to achieve a significant change in velocity, or uses an assistive device. 3. Gait with  horizontal head turns (2) Mild Impairment: Performs head turns smoothly with slight change in gait velocity, i.e., minor disruption to smooth gait path or uses walking aid. 4. Gait with vertical head turns (2) Mild Impairment: Performs head turns smoothly with slight change in gait velocity, i.e., minor disruption to smooth gait path or uses walking aid. 5. Gait and pivot turn (2) Mild Impairment: Pivot turns safely in > 3 seconds and stops with no loss of balance. 6. Step over obstacle (1) Moderate Impairment: Is able to step over box but must stop, then step over. May require verbal cueing. 7. Step around obstacles (2) Mild Impairment: Is able to step around both cones, but must slow down and adjust steps to clear cones. 8.  Stairs (2) Mild Impairment: Alternating feet, must use rail.  TOTAL SCORE: 14 / 24  GAIT: Distance walked: 349 ft Assistive device utilized: None Level of assistance: Complete Independence Comments: done during , patient demonstrates foot slap, excessive hip flexion on preswing, inadequate hip ext on terminal swing                                                                                                                                TREATMENT DATE:  11/09/2023  - Extensive conversation at length regarding patient's prior progress, perceived progress, and next steps regarding lack of ankle muscle strengthening. - Patient was educated on prognosis with peripheral neurological deficits such as CMT. patient was educated on exhausting resources for various outlets to promote patient's improved quality of life and function. -Time spent educating patient on next step regarding desired effects on ankle mobility and safety.  Patient was informed to initiate starting to schedule orthotic evaluation for proper orthotics.  As well as reach out to neurologist/PCP regarding areas with functional NMES.  11/06/2023  -Neuro-muscular Re-education: -k-tape starting at  navicular and applying laterally to fibular head + tan KT tape at first metatarsal and wrapping laterally <>medially into posterior tibialis tendon - Tandem stance balance 1 x 1' bialterally with UE support on // bars  -Heel elevated and pushed into eversion with 2.5lb plate with functional squats x 12 in // bars   11/02/2023  -Standing clamshell 1' bilaterally- terminated due to pt reporting knee pain with activity -Standing 6in stepdown with BUE support // bars 4 x 1' bilaterally  Neuro-muscular Re-education: -k-tape starting at navicular and applying laterally to fibular head -practiced ambulation forward and backwards with verbal cues and use of KT tape for tactile cues and increased proprioception of foot placement during stance pahse   PATIENT EDUCATION:  Education details: Educated on the pathoanatomy of pes cavus and foot drop. Educated on the goals and course of rehab. Educated on measures to reduce falls at home Person educated: Patient Education method: Explanation Education comprehension: verbalized understanding  HOME EXERCISE PROGRAM: Access Code: [email or web address] URL: [email or web address] Date: 08/11/2023 Prepared by: Juline Ohara  Exercises - Seated Calf Stretch with Strap  - 1-2 x daily - 7 x weekly - 3 sets - 3 reps - 30 hold - Toe Yoga - Alternating Great Toe and Lesser Toe Extension  - 1-2 x daily - 7 x weekly - 2 sets - 10 reps - Ankle Inversion Eversion Towel Slide  - 1 x daily - 7 x weekly - 2 sets - 10 reps - Seated Hip Adduction Isometrics with Ball  - 1-2 x daily - 7 x weekly - 3 sets - 10 reps - 3 hold  ASSESSMENT:  CLINICAL IMPRESSION: Patient being discharged today due to lack of progress.  Patient was highly educated on next steps regarding other resources to utilize for proper ankle mobility and muscle activation.  Patient will to reach out  to PCP/neurologist regarding further referrals to a clinic with a functional NMES.  Patient was also  informed to reach out to orthotist to see their recommendations for proper foot alignment.  Patient in agreement with plan to get another PT referral if more physical therapy.     OBJECTIVE IMPAIRMENTS: Abnormal gait, decreased activity tolerance, decreased balance, difficulty walking, decreased ROM, decreased strength, impaired flexibility, and postural dysfunction.   ACTIVITY LIMITATIONS: standing, squatting, stairs, and transfers  PARTICIPATION LIMITATIONS: meal prep, cleaning, laundry, driving, shopping, community activity, and yard work  PERSONAL FACTORS: Age, Time since onset of injury/illness/exacerbation, and 1-2 comorbidities: CMTD, PLIF  are also affecting patient's functional outcome.   REHAB POTENTIAL: Fair    CLINICAL DECISION MAKING: Evolving/moderate complexity  EVALUATION COMPLEXITY: Moderate   GOALS: Goals reviewed with patient? Yes  SHORT TERM GOALS: Target date: 08/09/23 Pt will demonstrate indep in HEP to facilitate carry-over of skilled services and improve functional outcomes Goal status: INITIAL  LONG TERM GOALS: Target date: 08/23/23  Pt will improve DGI by at least 3 points in order to demonstrate clinically significant improvement in balance and decreased risk for falls Baseline: 14 Goal status: INITIAL  2.  Pt will increase by at least 40 ft in order to demonstrate clinically significant improvement in community ambulation Baseline: 349 ft Goal status: INITIAL  3.  Pt will demonstrate increase in ankle ROM by 5-10 deg  to facilitate ease in ADLs  and ambulation Baseline: see above Goal status: INITIAL  4.  Pt will demonstrate increase in LE strength to 3+/5 to facilitate ease and safety in ambulation Baseline: 2-/5 Goal status: INITIAL  PLAN:  PT FREQUENCY: 2x/week  PT DURATION: 4 weeks  PLANNED INTERVENTIONS: 97164- PT Re-evaluation, 97110-Therapeutic exercises, 97530- Therapeutic activity, 97112- Neuromuscular re-education, 97535- Self  Care, 16109- Manual therapy, [id number]- Gait training, [phone]- Orthotic Fit/training, 97014- Electrical stimulation (unattended), [phone]- Electrical stimulation (manual), and Patient/Family education  PLAN FOR NEXT SESSION:  continue to progress LE flexibility and strengthening with electrical stimulation for ankle evertors. Progress dynamic SLS activities with gait belt for safety to pt tolerance. Follow up with bioness FES as potential referral, (see Derwood Flor).  Gatha Kaska PT, DPT [facility] [phone] office  9:45 PM, 11/10/23'

## 2023-11-13 ENCOUNTER — Telehealth: Payer: Self-pay

## 2023-11-13 ENCOUNTER — Encounter (HOSPITAL_COMMUNITY)

## 2023-11-13 ENCOUNTER — Other Ambulatory Visit (HOSPITAL_COMMUNITY): Payer: Self-pay

## 2023-11-13 NOTE — Telephone Encounter (Signed)
 Pharmacy Patient Advocate Encounter   Received notification from Pt Calls Messages that prior authorization for Tirosint  is required/requested.   Insurance verification completed.   The patient is insured through [facility] .   Per test claim: PA required; PA submitted to above mentioned insurance via CoverMyMeds Key/confirmation #/EOC [id number] Status is pending

## 2023-11-13 NOTE — Telephone Encounter (Signed)
 Pt notified her Rx for 90 day supply of Tirosint  needs a prior authorization.

## 2023-11-15 ENCOUNTER — Encounter (HOSPITAL_COMMUNITY): Admitting: Physical Therapy

## 2023-11-20 ENCOUNTER — Encounter (HOSPITAL_COMMUNITY)

## 2023-11-23 ENCOUNTER — Encounter (HOSPITAL_COMMUNITY): Admitting: Physical Therapy

## 2023-11-23 NOTE — Telephone Encounter (Signed)
 Pharmacy Patient Advocate Encounter  Received notification from [employer] that Prior Authorization for Tirosint  has been CANCELLED due to a previous approval on file effective 08-02-2023 to 07-10-2024   PA #/Case ID/Reference #: [id number]

## 2023-11-29 ENCOUNTER — Encounter: Payer: Self-pay | Admitting: Family Medicine

## 2023-11-29 ENCOUNTER — Ambulatory Visit (INDEPENDENT_AMBULATORY_CARE_PROVIDER_SITE_OTHER): Admitting: Family Medicine

## 2023-11-29 VITALS — BP 126/80 | HR 80 | Resp 18 | Ht 65.0 in | Wt 152.0 lb

## 2023-11-29 DIAGNOSIS — M21372 Foot drop, left foot: Secondary | ICD-10-CM

## 2023-11-29 DIAGNOSIS — E038 Other specified hypothyroidism: Secondary | ICD-10-CM

## 2023-11-29 DIAGNOSIS — I10 Essential (primary) hypertension: Secondary | ICD-10-CM

## 2023-11-29 DIAGNOSIS — R7989 Other specified abnormal findings of blood chemistry: Secondary | ICD-10-CM

## 2023-11-29 DIAGNOSIS — E782 Mixed hyperlipidemia: Secondary | ICD-10-CM | POA: Diagnosis not present

## 2023-11-29 DIAGNOSIS — Z23 Encounter for immunization: Secondary | ICD-10-CM

## 2023-11-29 DIAGNOSIS — E559 Vitamin D deficiency, unspecified: Secondary | ICD-10-CM

## 2023-11-29 DIAGNOSIS — Z1211 Encounter for screening for malignant neoplasm of colon: Secondary | ICD-10-CM | POA: Diagnosis not present

## 2023-11-29 NOTE — Assessment & Plan Note (Signed)
 Controlled, no change in medication Hyperlipidemia:Low fat diet discussed and encouraged.   Lipid Panel  Lab Results  Component Value Date   CHOL 149 09/28/2023   HDL 79 09/28/2023   LDLCALC 60 09/28/2023   TRIG 44 09/28/2023   CHOLHDL 1.9 09/28/2023

## 2023-11-29 NOTE — Assessment & Plan Note (Signed)
 Updated lab needed at/ before next visit.

## 2023-11-29 NOTE — Patient Instructions (Signed)
 F/U iin 6 month, call if you need me sooner  Cmp and EGFR, cBC, and vit D in sept when drawing  labs for Endo  Pneumonia 20 today  Nurse pls order cologuard for Sseptember 2025  It is important that you exercise regularly at least 30 minutes 5 times a week. If you develop chest pain, have severe difficulty breathing, or feel very tired, stop exercising immediately and seek medical attention   Think about what you will eat, plan ahead. Choose " clean, green, fresh or frozen" over canned, processed or packaged foods which are more sugary, salty and fatty. 70 to 75% of food eaten should be vegetables and fruit. Three meals at set times with snacks allowed between meals, but they must be fruit or vegetables. Aim to eat over a 12 hour period , example 7 am to 7 pm, and STOP after  your last meal of the day. Drink water ,generally about 64 ounces per day, no other drink is as healthy. Fruit juice is best enjoyed in a healthy way, by EATING the fruit.   Thanks for choosing [facility], we consider it a privelige to serve you.

## 2023-11-30 ENCOUNTER — Encounter: Payer: Self-pay | Admitting: "Endocrinology

## 2023-11-30 DIAGNOSIS — E038 Other specified hypothyroidism: Secondary | ICD-10-CM

## 2023-11-30 MED ORDER — ROSUVASTATIN CALCIUM 5 MG PO TABS: 5.0000 mg | ORAL_TABLET | Freq: Every evening | ORAL | 5 refills | Status: DC

## 2023-11-30 MED ORDER — TIROSINT 50 MCG PO CAPS: 50.0000 ug | ORAL_CAPSULE | Freq: Every day | ORAL | 4 refills | Status: DC

## 2023-12-01 ENCOUNTER — Encounter: Payer: Self-pay | Admitting: Family Medicine

## 2023-12-01 DIAGNOSIS — Z1211 Encounter for screening for malignant neoplasm of colon: Secondary | ICD-10-CM | POA: Insufficient documentation

## 2023-12-01 DIAGNOSIS — E559 Vitamin D deficiency, unspecified: Secondary | ICD-10-CM | POA: Insufficient documentation

## 2023-12-01 NOTE — Assessment & Plan Note (Signed)
 After obtaining informed consent, the pneumonia 20  vaccine is  administered , with no adverse effect noted at the time of administration.

## 2023-12-01 NOTE — Assessment & Plan Note (Signed)
Managed by endo and controlled 

## 2023-12-01 NOTE — Progress Notes (Signed)
   Julie May     MRN: [id number]      DOB: 1950-12-15  Chief Complaint  Patient presents with   Hypertension    6 month follow up     HPI Julie May is here for follow up and re-evaluation of chronic medical conditions, medication management and review of any available recent lab and radiology data.  Preventive health is updated, specifically  Cancer screening and Immunization.   Questions or concerns regarding consultations or procedures which the PT has had in the interim are  addressed. The PT denies any adverse reactions to current medications since the last visit. Very pleased with cholesterol labs There are no new concerns.  There are no specific complaints   ROS Denies recent fever or chills. Denies sinus pressure, nasal congestion, ear pain or sore throat. Denies chest congestion, productive cough or wheezing. Denies chest pains, palpitations and leg swelling Denies abdominal pain, nausea, vomiting,diarrhea or constipation.   Denies dysuria, frequency, hesitancy or incontinence.  Denies headaches, seizures, numbness, or tingling. Denies depression, anxiety or insomnia. Denies skin break down or rash.   PE  BP 126/80   Pulse 80   Resp 18   Ht 5\' 5"  (1.651 m)   Wt 152 lb 0.6 oz (69 kg)   SpO2 96%   BMI 25.30 kg/m   Patient alert and oriented and in no cardiopulmonary distress.  HEENT: No facial asymmetry, EOMI,     Neck supple .  Chest: Clear to auscultation bilaterally.  CVS: S1, S2 no murmurs, no S3.Regular rate.  ABD: Soft non tender.   Ext: No edema  MS: Adequate ROM spine, shoulders, hips and knees.  Skin: Intact, no ulcerations or rash noted.  Psych: Good eye contact, normal affect. Memory intact not anxious or depressed appearing.  CNS: CN 2-12 intact, power,  normal throughout.no focal deficits noted.   Assessment & Plan  Mixed hyperlipidemia Controlled, no change in medication Hyperlipidemia:Low fat diet discussed and  encouraged.   Lipid Panel  Lab Results  Component Value Date   CHOL 149 09/28/2023   HDL 79 09/28/2023   LDLCALC 60 09/28/2023   TRIG 44 09/28/2023   CHOLHDL 1.9 09/28/2023       Low vitamin D  level Updated lab needed at/ before next visit.   Encounter for immunization After obtaining informed consent, the pneumonia 20  vaccine is  administered , with no adverse effect noted at the time of administration.   Essential hypertension, benign Controlled, no change in medication DASH diet and commitment to daily physical activity for a minimum of 30 minutes discussed and encouraged, as a part of hypertension management. The importance of attaining a healthy weight is also discussed.     11/29/2023    4:58 PM 11/29/2023    4:28 PM 10/02/2023   10:19 AM 08/04/2023   10:31 AM 08/02/2023    2:01 PM 05/29/2023    9:49 AM 05/04/2023   10:58 AM  BP/Weight  Systolic BP 126 144 134 116 96 143 117  Diastolic BP 80 78 82 80 51 84 69  Wt. (Lbs)  152.04 155 154 150 149 145.12  BMI  25.3 kg/m2 25.79 kg/m2 25.63 kg/m2 24.96 kg/m2 24.79 kg/m2 24.15 kg/m2       Hypothyroidism Managed by endo and controlled  Left foot drop Has had full evakluation is to try to get orthotics fittted

## 2023-12-01 NOTE — Assessment & Plan Note (Signed)
 Controlled, no change in medication DASH diet and commitment to daily physical activity for a minimum of 30 minutes discussed and encouraged, as a part of hypertension management. The importance of attaining a healthy weight is also discussed.     11/29/2023    4:58 PM 11/29/2023    4:28 PM 10/02/2023   10:19 AM 08/04/2023   10:31 AM 08/02/2023    2:01 PM 05/29/2023    9:49 AM 05/04/2023   10:58 AM  BP/Weight  Systolic BP 126 144 134 116 96 143 117  Diastolic BP 80 78 82 80 51 84 69  Wt. (Lbs)  152.04 155 154 150 149 145.12  BMI  25.3 kg/m2 25.79 kg/m2 25.63 kg/m2 24.96 kg/m2 24.79 kg/m2 24.15 kg/m2

## 2023-12-01 NOTE — Assessment & Plan Note (Signed)
 Has had full evakluation is to try to get orthotics fittted

## 2023-12-05 ENCOUNTER — Other Ambulatory Visit (HOSPITAL_COMMUNITY): Payer: Self-pay

## 2023-12-07 ENCOUNTER — Other Ambulatory Visit: Payer: Self-pay

## 2023-12-11 ENCOUNTER — Other Ambulatory Visit: Payer: Self-pay | Admitting: "Endocrinology

## 2023-12-11 DIAGNOSIS — I10 Essential (primary) hypertension: Secondary | ICD-10-CM

## 2023-12-15 ENCOUNTER — Other Ambulatory Visit (HOSPITAL_COMMUNITY): Payer: Self-pay

## 2023-12-25 ENCOUNTER — Encounter: Payer: Self-pay | Admitting: Neurology

## 2023-12-25 ENCOUNTER — Ambulatory Visit: Payer: Medicare Other | Admitting: Neurology

## 2023-12-25 VITALS — BP 140/86 | HR 74 | Ht 65.0 in | Wt 157.0 lb

## 2023-12-25 DIAGNOSIS — Q6672 Congenital pes cavus, left foot: Secondary | ICD-10-CM

## 2023-12-25 DIAGNOSIS — M21371 Foot drop, right foot: Secondary | ICD-10-CM

## 2023-12-25 DIAGNOSIS — G600 Hereditary motor and sensory neuropathy: Secondary | ICD-10-CM

## 2023-12-25 DIAGNOSIS — Q6671 Congenital pes cavus, right foot: Secondary | ICD-10-CM | POA: Diagnosis not present

## 2023-12-25 NOTE — Patient Instructions (Addendum)
 Follow-up with Dr. Luster Salters for recommendations on foot orthotics and insoles at [facility] - [phone]. We will contact [facility] and see if they utilize Bioness (functional electrical stimulation)

## 2023-12-25 NOTE — Progress Notes (Signed)
 Follow-up Visit   Date: 12/25/2023    Julie May MRN: [id number] DOB: 01/06/1951    Julie May is a 73 y.o. right-handed Caucasian female with hypertension returning to the clinic for follow-up of bilateral foot drop.  The patient was accompanied to the clinic by self.  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  EMG shows severe axonal changes as well as slow-demyelinating features suggestive of CMT. Genetic testing has been declined.  Prognosis of neuropathy was discussed.  Progression is very slow.  She does not have pain.  There is imbalnce due to neuropathy and foot deformity and bilateral foot drop.  She was referred for PT who suggested a trial of Bioness, functional electrical stimulation would benefit her.  I will check to see if [facility] provides this service.  She also has many questions about foot insoles and appropriate arch support, which is best address by podiatry.  She has seen Dr. Wilnette Haste in the past and I recommend she follow-up with their office.  She was given prescription for bilateral AFO, and reports that she is not ready for this currently.   2. 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 Asymptomatic.   Return to clinic as needed  --------------------------------------------- History of present illness: She had lumbar decompression in 2021 after presenting with left foot drop with MRI showing severe spinal canal stenosis at L4-5.  Her foot drop resolved with surgery.  For the past year, she began noticing that her legs started getting weak and she feels unsteady, walks unassisted.   She has fallen twice. She has weakness in the feet, worse on the left.  She denies numbness/tingling of the feet or hand weakness.    Mother had feet that look similar with high arches and hammer toes.  She reports rolling her ankles frequently even as a child.    She is retired [employer].  UPDATE 07/13/2023:  She is here for  NCS/EMG of the legs.  There has been no interval change in her symptoms. She continues to have bilateral foot drop.  Mother has high arches and hammer toes as her, but lived into her 90s requiring gait assistance very late in life.   UPDATE 12/25/2023:  She is here for follow-up of neuropathy and bilateral foot drop.  She reviewed her EMG results and had questions about the waveforms, which were addressed. She did a few sessions of PT but was not making improvement so stopped. Her therapist had mentioned the use of Bioness for her foot drop and she is inquiring whether there was a way to trial this as it is costly. At her last visit, AFO was recommended and she saw orthotics specialist but felt that the AFO was too bulky and opted not to order this. From a neuropathy standpoint, there has been no progression.  She denies pain or numbness.  Weakness is unchanged.  She has imbalance and notices that her feet roll inwards.  Fortunately, no falls.   Medications:  Current Outpatient Medications on File Prior to Visit  Medication Sig Dispense Refill   Cholecalciferol  (VITAMIN D ) 2000 units CAPS Take 2,000 Units by mouth daily with lunch.     losartan  (COZAAR ) 25 MG tablet TAKE 1 TABLET(25 MG) BY MOUTH DAILY 90 tablet 0   rosuvastatin  (CRESTOR ) 5 MG tablet Take 1 tablet (5 mg total) by mouth at bedtime. 30 tablet 5   TIROSINT  50 MCG CAPS Take 1 capsule (50 mcg total)  by mouth daily before breakfast. 30 capsule 4   No current facility-administered medications on file prior to visit.    Allergies: No Known Allergies  Vital Signs:  There were no vitals taken for this visit.   Neurological Exam: MENTAL STATUS including orientation to time, place, person, recent and remote memory, attention span and concentration, language, and fund of knowledge is normal.  Speech is not dysarthric.  CRANIAL NERVES:   Face is symmetric. Extraocular muscles intact.   MOTOR:  Prominent bilateral foot deformity with high  arches and hammer toes.  She atrophy of the lower legs.    Upper Extremity:  Right   Left  Deltoid  5/5    5/5   Biceps  5/5    5/5   Triceps  5/5    5/5   Finger flexors  5/5    5/5   Dorsal interossei  4/5    4/5   Tone (Ashworth scale)  0   0    Lower Extremity:  Right   Left  Hip flexors  5/5    5/5   Knee extensors  5/5    5/5   Dorsiflexors  3/5    3/5   Plantarflexors  3/5    3/5   Toe extensors  3/5    2/5   Toe flexors  3/5    2/5   Tone (Ashworth scale)  0   0    MSRs:                                              Right        Left brachioradialis 2+   2+  biceps 2+   2+  triceps 2+   2+  patellar 0   0  ankle jerk 0   0  Hoffman no   no  plantar response down   down    SENSORY:  Vibration reduced at the left ankle, intact at the left ankle.    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  Overall, these findings are severe in degree electrically.    MRI lumbar spine wo contrast 06/27/2023: 1. Transitional lumbosacral anatomy. In keeping with the numbering applied to the previous MRI, there is a transitional, partially lumbarized S1 segment. This numbering assumes interval L4-5 PLIF and bilateral ribs at the L1 level. 2. Acute fracture of the mid to distal sacrum asymmetric to the left. 3. Interval posterior decompression and PLIF at L4-5 with well decompressed spinal canal and lateral recesses. Mild residual inferior foraminal narrowing bilaterally without L4 nerve root encroachment. 4. Adjacent segment changes at L3-4 with progressive multifactorial spinal stenosis, now moderate. In addition, there is moderate lateral recess and mild foraminal narrowing bilaterally. 5. New small right  paracentral disc extrusion and superior migration of a small disc fragment at L1-2 without resulting mass effect on the distal thoracic cord or foraminal narrowing. 6. No other significant changes or nerve root encroachment.   Thank you for allowing me to participate in patient's care.  If I can answer any additional questions, I would be pleased to do so.    Sincerely,    Kavir Savoca K.  Lydia Sams, DO

## 2023-12-26 ENCOUNTER — Telehealth: Payer: Self-pay

## 2023-12-26 DIAGNOSIS — G600 Hereditary motor and sensory neuropathy: Secondary | ICD-10-CM

## 2023-12-26 DIAGNOSIS — Q6671 Congenital pes cavus, right foot: Secondary | ICD-10-CM

## 2023-12-26 DIAGNOSIS — Q6672 Congenital pes cavus, left foot: Secondary | ICD-10-CM

## 2023-12-26 DIAGNOSIS — M4800 Spinal stenosis, site unspecified: Secondary | ICD-10-CM

## 2023-12-26 DIAGNOSIS — Z9889 Other specified postprocedural states: Secondary | ICD-10-CM

## 2023-12-26 DIAGNOSIS — M21371 Foot drop, right foot: Secondary | ICD-10-CM

## 2023-12-26 NOTE — Telephone Encounter (Signed)
 Called [facility] and was informed that they do not do the Bioness Functional Electrical Stimulation.

## 2023-12-27 DIAGNOSIS — D225 Melanocytic nevi of trunk: Secondary | ICD-10-CM | POA: Diagnosis not present

## 2023-12-27 DIAGNOSIS — L728 Other follicular cysts of the skin and subcutaneous tissue: Secondary | ICD-10-CM | POA: Diagnosis not present

## 2023-12-27 DIAGNOSIS — X32XXXD Exposure to sunlight, subsequent encounter: Secondary | ICD-10-CM | POA: Diagnosis not present

## 2023-12-27 DIAGNOSIS — L570 Actinic keratosis: Secondary | ICD-10-CM | POA: Diagnosis not present

## 2023-12-27 NOTE — Telephone Encounter (Signed)
 Called patient and left a message for a call back.

## 2023-12-27 NOTE — Telephone Encounter (Signed)
 Patient returned call and was informed that [facility] does not do the Bioness Functional Electrical Stimulation. However, It looks like [facility] in [place] may use it. If she would like a referral, we can place referral.    [email or web address]   Patient would like a referral sent to above. Patient thanked us  for the call and had no further questions or concerns.

## 2023-12-28 ENCOUNTER — Other Ambulatory Visit: Payer: Self-pay | Admitting: "Endocrinology

## 2023-12-28 ENCOUNTER — Other Ambulatory Visit (HOSPITAL_COMMUNITY): Payer: Self-pay

## 2023-12-28 DIAGNOSIS — E038 Other specified hypothyroidism: Secondary | ICD-10-CM

## 2023-12-28 NOTE — Addendum Note (Signed)
 Addended by: Rox Cope A on: 12/28/2023 03:34 PM   Modules accepted: Orders

## 2023-12-28 NOTE — Telephone Encounter (Signed)
 Called [facility]  Out patient Re[facility]. Phone#:[phone] Fax#:[phone].  Confirmed that they do offer the Bioness Functional Stimulation. Was informed to send a PT referral to number above.

## 2023-12-30 ENCOUNTER — Other Ambulatory Visit (HOSPITAL_COMMUNITY): Payer: Self-pay

## 2024-01-03 ENCOUNTER — Other Ambulatory Visit (HOSPITAL_COMMUNITY): Payer: Self-pay

## 2024-01-03 ENCOUNTER — Other Ambulatory Visit: Payer: Self-pay | Admitting: "Endocrinology

## 2024-01-03 ENCOUNTER — Encounter: Payer: Self-pay | Admitting: "Endocrinology

## 2024-01-03 DIAGNOSIS — E038 Other specified hypothyroidism: Secondary | ICD-10-CM

## 2024-01-04 ENCOUNTER — Other Ambulatory Visit: Payer: Self-pay

## 2024-01-04 ENCOUNTER — Other Ambulatory Visit: Payer: Self-pay | Admitting: "Endocrinology

## 2024-01-04 ENCOUNTER — Other Ambulatory Visit (HOSPITAL_COMMUNITY): Payer: Self-pay

## 2024-01-04 DIAGNOSIS — E038 Other specified hypothyroidism: Secondary | ICD-10-CM

## 2024-01-04 MED ORDER — TIROSINT 50 MCG PO CAPS: 50.0000 ug | ORAL_CAPSULE | Freq: Every day | ORAL | 2 refills | Status: DC

## 2024-01-04 MED ORDER — TIROSINT 50 MCG PO CAPS
50.0000 ug | ORAL_CAPSULE | Freq: Every day | ORAL | 1 refills | Status: DC
  Filled 2024-01-04: qty 30, 30d supply, fill #0

## 2024-01-05 ENCOUNTER — Other Ambulatory Visit: Payer: Self-pay

## 2024-01-05 ENCOUNTER — Other Ambulatory Visit (HOSPITAL_COMMUNITY): Payer: Self-pay

## 2024-01-08 ENCOUNTER — Other Ambulatory Visit: Payer: Self-pay

## 2024-01-08 ENCOUNTER — Other Ambulatory Visit (HOSPITAL_COMMUNITY): Payer: Self-pay

## 2024-01-08 DIAGNOSIS — H25813 Combined forms of age-related cataract, bilateral: Secondary | ICD-10-CM | POA: Diagnosis not present

## 2024-01-10 ENCOUNTER — Other Ambulatory Visit (HOSPITAL_COMMUNITY): Payer: Self-pay

## 2024-02-02 ENCOUNTER — Encounter (INDEPENDENT_AMBULATORY_CARE_PROVIDER_SITE_OTHER): Payer: Self-pay | Admitting: Otolaryngology

## 2024-02-02 ENCOUNTER — Ambulatory Visit (INDEPENDENT_AMBULATORY_CARE_PROVIDER_SITE_OTHER): Payer: Medicare Other | Admitting: Otolaryngology

## 2024-02-02 VITALS — BP 127/76 | HR 72

## 2024-02-02 DIAGNOSIS — H6123 Impacted cerumen, bilateral: Secondary | ICD-10-CM

## 2024-02-02 DIAGNOSIS — H903 Sensorineural hearing loss, bilateral: Secondary | ICD-10-CM | POA: Diagnosis not present

## 2024-02-03 ENCOUNTER — Other Ambulatory Visit (HOSPITAL_COMMUNITY): Payer: Self-pay

## 2024-02-04 DIAGNOSIS — H903 Sensorineural hearing loss, bilateral: Secondary | ICD-10-CM | POA: Insufficient documentation

## 2024-02-04 NOTE — Progress Notes (Signed)
 Patient ID: Julie May, female   DOB: 20-Jun-1951, 73 y.o.   MRN: [id number]  Follow-up: Hearing loss  HPI: The patient is a 73 year old female who returns today for her follow-up evaluation.  The patient has a history of bilateral hearing loss.  Her hearing test showed bilateral high-frequency sensorineural hearing loss, consistent with presbycusis.  She has never worn hearing aids.  The patient returns today reporting no significant change in her hearing.  She has noted increasing clogging sensation in the ears, likely secondary to cerumen impaction.  She denies any otalgia, otorrhea, or vertigo.  Exam: General: Communicates without difficulty, well nourished, no acute distress. Head: Normocephalic, no evidence injury, no tenderness, facial buttresses intact without stepoff. Face/sinus: No tenderness to palpation and percussion. Facial movement is normal and symmetric. Eyes: PERRL, EOMI. No scleral icterus, conjunctivae clear. Neuro: CN II exam reveals vision grossly intact.  No nystagmus at any point of gaze. Ears: Auricles well formed without lesions.  Bilateral cerumen impaction.  Nose: External evaluation reveals normal support and skin without lesions.  Dorsum is intact.  Anterior rhinoscopy reveals normal mucosa over anterior aspect of inferior turbinates and intact septum.  No purulence noted. Oral:  Oral cavity and oropharynx are intact, symmetric, without erythema or edema.  Mucosa is moist without lesions. Neck: Full range of motion without pain.  There is no significant lymphadenopathy.  No masses palpable.  Thyroid  bed within normal limits to palpation.  Parotid glands and submandibular glands equal bilaterally without mass.  Trachea is midline. Neuro:  CN 2-12 grossly intact.   Procedure: Bilateral cerumen disimpaction Anesthesia: None Description: Under the operating microscope, the cerumen is carefully removed with a combination of cerumen currette, alligator forceps, and suction  catheters.  After the cerumen is removed, the TMs are noted to be normal.  No mass, erythema, or lesions. The patient tolerated the procedure well.    Assessment: 1.  Subjectively stable bilateral high-frequency sensorineural hearing loss. 2.  Bilateral cerumen impaction.  Her ear canals, tympanic membranes, and middle ear spaces are otherwise normal.  Plan: 1.  The physical exam findings are reviewed with the patient. 2.  Otomicroscopy with bilateral cerumen disimpaction. 3.  The patient is a candidate for hearing amplification. 4.  The patient will return for reevaluation in 6 months.

## 2024-02-05 ENCOUNTER — Other Ambulatory Visit: Payer: Self-pay

## 2024-02-06 ENCOUNTER — Other Ambulatory Visit: Payer: Self-pay

## 2024-03-05 ENCOUNTER — Other Ambulatory Visit (HOSPITAL_COMMUNITY): Payer: Self-pay

## 2024-03-06 ENCOUNTER — Other Ambulatory Visit: Payer: Self-pay

## 2024-03-06 DIAGNOSIS — Z1211 Encounter for screening for malignant neoplasm of colon: Secondary | ICD-10-CM | POA: Diagnosis not present

## 2024-03-07 ENCOUNTER — Other Ambulatory Visit: Payer: Self-pay

## 2024-03-13 ENCOUNTER — Encounter: Payer: Self-pay | Admitting: "Endocrinology

## 2024-03-16 LAB — COLOGUARD: COLOGUARD: NEGATIVE

## 2024-03-17 ENCOUNTER — Other Ambulatory Visit: Payer: Self-pay | Admitting: "Endocrinology

## 2024-03-17 DIAGNOSIS — I10 Essential (primary) hypertension: Secondary | ICD-10-CM

## 2024-03-19 ENCOUNTER — Encounter: Payer: Self-pay | Admitting: "Endocrinology

## 2024-03-20 ENCOUNTER — Other Ambulatory Visit: Payer: Self-pay

## 2024-03-20 DIAGNOSIS — I10 Essential (primary) hypertension: Secondary | ICD-10-CM

## 2024-03-20 MED ORDER — LOSARTAN POTASSIUM 25 MG PO TABS: 25.0000 mg | ORAL_TABLET | Freq: Every day | ORAL | 0 refills | Status: DC

## 2024-03-27 DIAGNOSIS — E038 Other specified hypothyroidism: Secondary | ICD-10-CM | POA: Diagnosis not present

## 2024-03-27 DIAGNOSIS — E782 Mixed hyperlipidemia: Secondary | ICD-10-CM | POA: Diagnosis not present

## 2024-03-27 DIAGNOSIS — E559 Vitamin D deficiency, unspecified: Secondary | ICD-10-CM | POA: Diagnosis not present

## 2024-03-28 LAB — VITAMIN D 25 HYDROXY (VIT D DEFICIENCY, FRACTURES): Vit D, 25-Hydroxy: 40.3 ng/mL (ref 30.0–100.0)

## 2024-03-28 LAB — CBC WITH DIFFERENTIAL/PLATELET
Basophils Absolute: 0 x10E3/uL (ref 0.0–0.2)
Basos: 1 %
EOS (ABSOLUTE): 0.1 x10E3/uL (ref 0.0–0.4)
Eos: 2 %
Hematocrit: 42.6 % (ref 34.0–46.6)
Hemoglobin: 14.3 g/dL (ref 11.1–15.9)
Immature Grans (Abs): 0 x10E3/uL (ref 0.0–0.1)
Immature Granulocytes: 0 %
Lymphocytes Absolute: 1.9 x10E3/uL (ref 0.7–3.1)
Lymphs: 42 %
MCH: 29.7 pg (ref 26.6–33.0)
MCHC: 33.6 g/dL (ref 31.5–35.7)
MCV: 88 fL (ref 79–97)
Monocytes Absolute: 0.4 x10E3/uL (ref 0.1–0.9)
Monocytes: 9 %
Neutrophils Absolute: 2.1 x10E3/uL (ref 1.4–7.0)
Neutrophils: 46 %
Platelets: 240 x10E3/uL (ref 150–450)
RBC: 4.82 x10E6/uL (ref 3.77–5.28)
RDW: 13.1 % (ref 11.7–15.4)
WBC: 4.5 x10E3/uL (ref 3.4–10.8)

## 2024-03-28 LAB — LIPID PANEL
Chol/HDL Ratio: 1.9 ratio (ref 0.0–4.4)
Cholesterol, Total: 144 mg/dL (ref 100–199)
HDL: 77 mg/dL (ref >39)
LDL Chol Calc (NIH): 56 mg/dL (ref 0–99)
Triglycerides: 47 mg/dL (ref 0–149)
VLDL Cholesterol Cal: 11 mg/dL (ref 5–40)

## 2024-03-28 LAB — CMP14+EGFR
ALT: 15 IU/L (ref 0–32)
AST: 24 IU/L (ref 0–40)
Albumin: 4.1 g/dL (ref 3.8–4.8)
Alkaline Phosphatase: 94 IU/L (ref 49–135)
BUN/Creatinine Ratio: 14 (ref 12–28)
BUN: 10 mg/dL (ref 8–27)
Bilirubin Total: 0.5 mg/dL (ref 0.0–1.2)
CO2: 22 mmol/L (ref 20–29)
Calcium: 9.1 mg/dL (ref 8.7–10.3)
Chloride: 97 mmol/L (ref 96–106)
Creatinine, Ser: 0.71 mg/dL (ref 0.57–1.00)
Globulin, Total: 2.2 g/dL (ref 1.5–4.5)
Glucose: 89 mg/dL (ref 70–99)
Potassium: 4.3 mmol/L (ref 3.5–5.2)
Sodium: 134 mmol/L (ref 134–144)
Total Protein: 6.3 g/dL (ref 6.0–8.5)
eGFR: 89 mL/min/1.73 (ref >59)

## 2024-03-28 LAB — TSH: TSH: 4.79 u[IU]/mL — ABNORMAL HIGH (ref 0.450–4.500)

## 2024-03-28 LAB — T4, FREE: Free T4: 1.58 ng/dL (ref 0.82–1.77)

## 2024-04-03 ENCOUNTER — Encounter: Payer: Self-pay | Admitting: "Endocrinology

## 2024-04-03 ENCOUNTER — Ambulatory Visit (INDEPENDENT_AMBULATORY_CARE_PROVIDER_SITE_OTHER): Admitting: "Endocrinology

## 2024-04-03 ENCOUNTER — Ambulatory Visit: Admitting: "Endocrinology

## 2024-04-03 VITALS — BP 124/72 | HR 68 | Ht 65.0 in | Wt 159.0 lb

## 2024-04-03 DIAGNOSIS — E782 Mixed hyperlipidemia: Secondary | ICD-10-CM

## 2024-04-03 DIAGNOSIS — M8589 Other specified disorders of bone density and structure, multiple sites: Secondary | ICD-10-CM

## 2024-04-03 DIAGNOSIS — R7303 Prediabetes: Secondary | ICD-10-CM

## 2024-04-03 DIAGNOSIS — E038 Other specified hypothyroidism: Secondary | ICD-10-CM

## 2024-04-03 DIAGNOSIS — I10 Essential (primary) hypertension: Secondary | ICD-10-CM | POA: Diagnosis not present

## 2024-04-03 MED ORDER — TIROSINT 50 MCG PO CAPS: 50.0000 ug | ORAL_CAPSULE | Freq: Every day | ORAL | 1 refills | Status: AC

## 2024-04-03 NOTE — Progress Notes (Signed)
 04/03/2024        Endocrinology Follow Up Visit   Subjective:    Patient ID: Julie May, female    DOB: 1950-09-04, PCP Julie Rollene BRAVO, MD   Past Medical History:  Diagnosis Date   Bilateral impacted cerumen 04/10/2019   Callus of foot 11/07/2017   Foot pain, bilateral 05/23/2013   Hyperlipidemia    Hyperpotassemia    Hypertension    new dx 11/12/2010   Hypothyroidism    OA (osteoarthritis)    Other abnormal glucose    Overweight(278.02)    Polycythemia, secondary    Routine general medical examination at a health care facility    THYROID  NODULE 01/18/2010   Qualifier: Diagnosis of  By: Kassie MD, Sean A    Trigger finger (acquired)    Unspecified disorder of thyroid     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 Unice Leash Spine   TONSILECTOMY, ADENOIDECTOMY, BILATERAL MYRINGOTOMY AND TUBES     TONSILLECTOMY     uterine polyp removal      Social History   Socioeconomic History   Marital status: Married    Spouse name: Mitchell   Number of children: 0   Years of education: Not on file   Highest education level: Bachelor's degree (e.g., BA, AB, BS)  Occupational History   Occupation: Conservator, museum/gallery   Tobacco Use   Smoking status: Never   Smokeless tobacco: Never  Vaping Use   Vaping status: Never Used  Substance and Sexual Activity   Alcohol use: Not Currently    Alcohol/week: 1.0 standard drink of alcohol    Types: 1 Glasses of wine per week    Comment: occasional beer   Drug use: No   Sexual activity: Yes    Birth control/protection: Post-menopausal  Other Topics Concern   Not on file  Social History Narrative   Married x 36 years 2022.         Are you right handed or left handed? Right Handed    Are you currently  employed ? No   What is your current occupation? Retired    Do you live at home alone? No    Who lives with you? With husband    What type of home do you live in: 1 story or 2 story? Lives in a one story home with a basement        Social Drivers of Health   Financial Resource Strain: Low Risk  (11/28/2023)   Overall Financial Resource Strain (CARDIA)    Difficulty of Paying Living Expenses: Not hard at all  Food Insecurity: No Food Insecurity (11/28/2023)   Hunger Vital Sign    Worried About Running Out of Food in the Last Year: Never true    Ran Out of Food in the Last Year: Never true  Transportation Needs: No Transportation Needs (11/28/2023)   PRAPARE - Administrator, Civil Service (Medical): No    Lack of Transportation (Non-Medical): No  Physical Activity: Insufficiently Active (11/28/2023)   Exercise Vital Sign    Days of Exercise per Week: 4 days    Minutes of Exercise per Session: 30 min  Stress: No Stress Concern Present (11/28/2023)   [employer] of Occupational Health - Occupational  Stress Questionnaire    Feeling of Stress : Not at all  Social Connections: Socially Integrated (11/28/2023)   Social Connection and Isolation Panel    Frequency of Communication with Friends and Family: More than three times a week    Frequency of Social Gatherings with Friends and Family: More than three times a week    Attends Religious Services: More than 4 times per year    Active Member of [employer] or Organizations: Yes    Attends Banker Meetings: More than 4 times per year    Marital Status: Married   Outpatient Encounter Medications as of 04/03/2024  Medication Sig   rosuvastatin  (CRESTOR ) 5 MG tablet Take 5 mg by mouth every other day.   [DISCONTINUED] rosuvastatin  (CRESTOR ) 5 MG tablet Take 1 tablet (5 mg total) by mouth at bedtime. (Patient taking differently: Take 5 mg by mouth 3 (three) times a week.)   [DISCONTINUED] TIROSINT  50 MCG CAPS Take 1  capsule (50 mcg total) by mouth daily before breakfast.   Cholecalciferol  (VITAMIN D ) 2000 units CAPS Take 2,000 Units by mouth daily with lunch.   losartan  (COZAAR ) 25 MG tablet Take 1 tablet (25 mg total) by mouth daily.   TIROSINT  50 MCG CAPS Take 1 capsule (50 mcg total) by mouth daily before breakfast.   [DISCONTINUED] losartan  (COZAAR ) 25 MG tablet TAKE 1 TABLET(25 MG) BY MOUTH DAILY   [DISCONTINUED] TIROSINT  50 MCG CAPS Take 1 capsule (50 mcg total) by mouth daily before breakfast.   [DISCONTINUED] TIROSINT  50 MCG CAPS Take 1 capsule (50 mcg total) by mouth daily before breakfast.   No facility-administered encounter medications on file as of 04/03/2024.   ALLERGIES: No Known Allergies VACCINATION STATUS: Immunization History  Administered Date(s) Administered   Fluad Quad(high Dose 65+) 04/10/2019, 04/13/2020, 04/20/2021, 04/27/2022   Fluad Trivalent(High Dose 65+) 05/04/2023   Influenza Split 06/22/2011   Influenza Whole 05/18/2006, 04/30/2007, 04/14/2008   Influenza,inj,Quad PF,6+ Mos 05/31/2013, 05/28/2014, 04/14/2015, 04/27/2016, 05/16/2017, 04/13/2018   Moderna SARS-COV2 Booster Vaccination 04/29/2021   Moderna Sars-Covid-2 Vaccination 08/23/2019, 09/20/2019, 05/23/2020, 10/30/2020   PNEUMOCOCCAL CONJUGATE-20 11/29/2023   Pneumococcal Conjugate-13 09/14/2018   Td 08/13/2009   Tdap 10/27/2022   Zoster Recombinant(Shingrix) 03/30/2021, 08/12/2021   Zoster, Live 03/23/2011    Thyroid  Problem Presents for follow-up visit. Patient reports no anxiety, cold intolerance, constipation, depressed mood, diarrhea, fatigue, heat intolerance, leg swelling, palpitations, tremors, weight gain or weight loss. (She has history of intolerance to standard precautions on levothyroxine , Synthroid .  She is currently on Tirosint  50 mcg p.o. daily before breakfast.  She continues to tolerate this preparation.) The symptoms have been stable.  Hyperlipidemia This is a chronic problem. The current  episode started more than 1 year ago. The problem is uncontrolled. She is currently on no antihyperlipidemic treatment. Risk factors for coronary artery disease include dyslipidemia and hypertension.   Hypertension:  She is  on treatment.  Review of systems  Constitutional: + Minimally fluctuating body weight.,  current Body mass index is 26.46 kg/m. , no fatigue, no subjective hyperthermia, no subjective hypothermia    Objective:    BP 124/72   Pulse 68   Ht 5' 5 (1.651 m)   Wt 159 lb (72.1 kg)   BMI 26.46 kg/m   Wt Readings from Last 3 Encounters:  04/03/24 159 lb (72.1 kg)  12/25/23 157 lb (71.2 kg)  11/29/23 152 lb 0.6 oz (69 kg)    BP Readings from Last 3 Encounters:  04/03/24 124/72  02/02/24 127/76  12/25/23 (!) 140/86    Physical Exam- Limited  Constitutional:  Body mass index is 26.46 kg/m. , not in acute distress  CMP     Component Value Date/Time   NA 134 03/27/2024 0958   K 4.3 03/27/2024 0958   CL 97 03/27/2024 0958   CO2 22 03/27/2024 0958   GLUCOSE 89 03/27/2024 0958   GLUCOSE 99 10/30/2022 0535   BUN 10 03/27/2024 0958   CREATININE 0.71 03/27/2024 0958   CREATININE 0.80 05/11/2020 0000   CALCIUM  9.1 03/27/2024 0958   PROT 6.3 03/27/2024 0958   ALBUMIN  4.1 03/27/2024 0958   AST 24 03/27/2024 0958   ALT 15 03/27/2024 0958   ALKPHOS 94 03/27/2024 0958   BILITOT 0.5 03/27/2024 0958   GFRNONAA >60 10/30/2022 0535   GFRNONAA 75 05/11/2020 0000   GFRAA 87 05/11/2020 0000     Diabetic Labs (most recent): Lab Results  Component Value Date   HGBA1C 5.6 (A) 09/29/2022   HGBA1C 5.5 (A) 06/25/2021   HGBA1C 5.7 (H) 05/11/2020      Lipid Panel     Component Value Date/Time   CHOL 144 03/27/2024 0951   TRIG 47 03/27/2024 0951   HDL 77 03/27/2024 0951   CHOLHDL 1.9 03/27/2024 0951   CHOLHDL 2.5 05/11/2020 0000   VLDL 10 04/03/2019 0934   LDLCALC 56 03/27/2024 0951   LDLCALC 110 (H) 05/11/2020 0000   Recent Results (from the past 2160  hours)  Cologuard     Status: None   Collection Time: 03/06/24 10:30 AM  Result Value Ref Range   COLOGUARD Negative Negative    Comment: The Cologuard Plus (TM) test was performed on this specimen.  NEGATIVE TEST RESULT. A negative (normal) Cologuard Plus result means the patient has a less-than-average chance of having colorectal cancer (CRC) or advanced precancer (polyps or lesions that could become cancer). Negative is the normal value (reference range) for this assay.  Guidelines recommend screening again 3 years after a negative Cologuard Plus result. Continued screening increases the chance of finding CRC early or preventing it entirely.  A clinical validation study showed the Cologuard Plus test is effective at ruling out CRC. Out of every 10,000 patients testing negative, approximately 2 will be falsely reassured that they do not have CRC, and out of every 100 patients testing negative, approximately 7 patients will be falsely reassured they do not have advanced precancer.  TEST DESCRIPTION: The Cologuard Plus test is a multi-target stool DNA (mt-sDNA) test that analyzes DNA and hemoglobin biomarkers in  stool. It uses a proprietary algorithm to qualitatively detect CRC and advanced precancer. It is FDA-approved and indicated for use in adults 45 years or older at average risk for CRC.  A positive (abnormal) result should be followed by a colonoscopy. Patients with a negative (normal) result should screen again in 3 years. False positive and false negative results may occur. The USPSTF recommends the Cologuard test as a CRC screening option. Their modeling estimates that screening with the test every 3 years from ages 47-85 could prevent up to 73% of CRC and avoid up to 85% of CRC deaths.  A 18,911-patient clinical trial found the Cologuard Plus test effectively detects CRC and precancer. The study found the test was 95% sensitive for CRC, 43% sensitive for advanced precancer, and had a 91%  specificity (Cologuard Plus Clinician Brochure. [employer]. [place], [place] Visit [email or web address] for more test information,  references, warnings, and precautions.   Lipid  panel     Status: None   Collection Time: 03/27/24  9:51 AM  Result Value Ref Range   Cholesterol, Total 144 100 - 199 mg/dL   Triglycerides 47 0 - 149 mg/dL   HDL 77 >60 mg/dL   VLDL Cholesterol Cal 11 5 - 40 mg/dL   LDL Chol Calc (NIH) 56 0 - 99 mg/dL   Chol/HDL Ratio 1.9 0.0 - 4.4 ratio    Comment:                                   T. Chol/HDL Ratio                                             Men  Women                               1/2 Avg.Risk  3.4    3.3                                   Avg.Risk  5.0    4.4                                2X Avg.Risk  9.6    7.1                                3X Avg.Risk 23.4   11.0   TSH     Status: Abnormal   Collection Time: 03/27/24  9:51 AM  Result Value Ref Range   TSH 4.790 (H) 0.450 - 4.500 uIU/mL  T4, free     Status: None   Collection Time: 03/27/24  9:51 AM  Result Value Ref Range   Free T4 1.58 0.82 - 1.77 ng/dL  [id number]     Status: None   Collection Time: 03/27/24  9:58 AM  Result Value Ref Range   Glucose 89 70 - 99 mg/dL   BUN 10 8 - 27 mg/dL   Creatinine, Ser 9.28 0.57 - 1.00 mg/dL   eGFR 89 >40 fO/fpw/8.26   BUN/Creatinine Ratio 14 12 - 28   Sodium 134 134 - 144 mmol/L   Potassium 4.3 3.5 - 5.2 mmol/L   Chloride 97 96 - 106 mmol/L   CO2 22 20 - 29 mmol/L   Calcium  9.1 8.7 - 10.3 mg/dL   Total Protein 6.3 6.0 - 8.5 g/dL   Albumin  4.1 3.8 - 4.8 g/dL   Globulin, Total 2.2 1.5 - 4.5 g/dL   Bilirubin Total 0.5 0.0 - 1.2 mg/dL   Alkaline Phosphatase 94 49 - 135 IU/L    Comment:               **Please note reference interval change**   AST 24 0 - 40 IU/L   ALT 15 0 - 32 IU/L  CBC with Differential/Platelet     Status: None   Collection Time: 03/27/24  9:58 AM  Result Value Ref Range    WBC 4.5 3.4 - 10.8 x10E3/uL   RBC 4.82 3.77 - 5.28  x10E6/uL   Hemoglobin 14.3 11.1 - 15.9 g/dL   Hematocrit 57.3 65.9 - 46.6 %   MCV 88 79 - 97 fL   MCH 29.7 26.6 - 33.0 pg   MCHC 33.6 31.5 - 35.7 g/dL   RDW 86.8 88.2 - 84.5 %   Platelets 240 150 - 450 x10E3/uL   Neutrophils 46 Not Estab. %   Lymphs 42 Not Estab. %   Monocytes 9 Not Estab. %   Eos 2 Not Estab. %   Basos 1 Not Estab. %   Neutrophils Absolute 2.1 1.4 - 7.0 x10E3/uL   Lymphocytes Absolute 1.9 0.7 - 3.1 x10E3/uL   Monocytes Absolute 0.4 0.1 - 0.9 x10E3/uL   EOS (ABSOLUTE) 0.1 0.0 - 0.4 x10E3/uL   Basophils Absolute 0.0 0.0 - 0.2 x10E3/uL   Immature Granulocytes 0 Not Estab. %   Immature Grans (Abs) 0.0 0.0 - 0.1 x10E3/uL  VITAMIN D  25 Hydroxy (Vit-D Deficiency, Fractures)     Status: None   Collection Time: 03/27/24  9:58 AM  Result Value Ref Range   Vit D, 25-Hydroxy 40.3 30.0 - 100.0 ng/mL    Comment: Vitamin D  deficiency has been defined by the Institute of Medicine and an Endocrine Society practice guideline as a level of serum 25-OH vitamin D  less than 20 ng/mL (1,2). The Endocrine Society went on to further define vitamin D  insufficiency as a level between 21 and 29 ng/mL (2). 1. IOM (Institute of Medicine). 2010. Dietary reference    intakes for calcium  and D. [place]: The    [employer]. 2. Holick MF, Binkley Harleigh, Bischoff-Ferrari HA, et al.    Evaluation, treatment, and prevention of vitamin D     deficiency: an Endocrine Society clinical practice    guideline. JCEM. 2011 Jul; 96(7):1911-30.      Assessment & Plan:   1.  Primary hypothyroidism -Her previsit thyroid  function tests are discordant showing target free T4, however still slightly above target TSH.  She was given an option of taking alternating doses of 50 and 75 mcg of Tirosint , however she opts to stay on her current dose of 50 mcg instead.  That is acceptable with the next measurement.     - We discussed about the  correct intake of her thyroid  hormone, on empty stomach at fasting, with water , separated by at least 30 minutes from breakfast and other medications,  and separated by more than 4 hours from calcium , iron, multivitamins, acid reflux medications (PPIs). -Patient is made aware of the fact that thyroid  hormone replacement is needed for life, dose to be adjusted by periodic monitoring of thyroid  function tests.  -She did not tolerate levothyroxine  or Synthroid  in the past.    2. Nontoxic multinodular goiter Her last ultrasound from 2017 was unchanged from previous studies, showing no significant findings.  More recent imaging showed similar findings in 2022.  No further thyroid  imaging is necessary.    3.  Hypertension -She is responding to her blood pressure management with losartan  25 mg p.o. daily at breakfast.  She is advised to continue her current regimen.     4) Hyperlipidemia: she did not engage with lifestyle medicine optimally.  She is responding to low-dose Crestor  with improvement in her lipid panel, LDL at 56.  She is advised to continue Crestor  5 mg every other night.     5) Prediabetes- resolved with ac of 5.6%.  She will not need medication intervention for this at this time.  Lifestyle medicine nutrition  described above will help her avoid progression to type 2 diabetes.  6) osteopenia: She would not need intervention with bisphosphonates for now.  She is advised to maintain adequate calcium  and vitamin D .  She will need a repeat, follow-up bone density in February 2027.   - I advised patient to maintain close follow up with Julie Rollene BRAVO, MD for primary care needs.   I spent  26  minutes in the care of the patient today including review of labs from Thyroid  Function, CMP, and other relevant labs ; imaging/biopsy records (current and previous including abstractions from other facilities); face-to-face time discussing  her lab results and symptoms, medications doses, her  options of short and long term treatment based on the latest standards of care / guidelines;   and documenting the encounter.  Ronal LITTIE Mon  participated in the discussions, expressed understanding, and voiced agreement with the above plans.  All questions were answered to her satisfaction. she is encouraged to contact clinic should she have any questions or concerns prior to her return visit.   Follow up plan: Return in about 6 months (around 10/01/2024) for F/U with Pre-visit Labs.  Benton Rio, [facility] [address] [place], [place] [address] Phone: [phone] Fax: [phone]  04/03/2024, 12:33 PM

## 2024-04-08 ENCOUNTER — Other Ambulatory Visit: Payer: Self-pay

## 2024-04-09 ENCOUNTER — Other Ambulatory Visit (HOSPITAL_COMMUNITY): Payer: Self-pay

## 2024-04-09 ENCOUNTER — Other Ambulatory Visit: Payer: Self-pay

## 2024-05-04 ENCOUNTER — Other Ambulatory Visit: Payer: Self-pay | Admitting: "Endocrinology

## 2024-05-04 ENCOUNTER — Other Ambulatory Visit (HOSPITAL_COMMUNITY): Payer: Self-pay

## 2024-05-04 DIAGNOSIS — E038 Other specified hypothyroidism: Secondary | ICD-10-CM

## 2024-05-06 ENCOUNTER — Other Ambulatory Visit: Payer: Self-pay

## 2024-05-06 ENCOUNTER — Other Ambulatory Visit (HOSPITAL_COMMUNITY): Payer: Self-pay

## 2024-05-06 MED ORDER — TIROSINT 50 MCG PO CAPS
50.0000 ug | ORAL_CAPSULE | Freq: Every day | ORAL | 1 refills | Status: AC
  Filled 2024-05-06: qty 30, 30d supply, fill #0
  Filled 2024-06-04: qty 30, 30d supply, fill #1
  Filled 2024-07-05: qty 30, 30d supply, fill #2
  Filled 2024-08-05 – 2024-08-08 (×2): qty 30, 30d supply, fill #3

## 2024-05-13 ENCOUNTER — Encounter: Payer: Self-pay | Admitting: Radiology

## 2024-05-27 DIAGNOSIS — Z1283 Encounter for screening for malignant neoplasm of skin: Secondary | ICD-10-CM | POA: Diagnosis not present

## 2024-05-27 DIAGNOSIS — X32XXXD Exposure to sunlight, subsequent encounter: Secondary | ICD-10-CM | POA: Diagnosis not present

## 2024-05-27 DIAGNOSIS — L570 Actinic keratosis: Secondary | ICD-10-CM | POA: Diagnosis not present

## 2024-05-27 DIAGNOSIS — D225 Melanocytic nevi of trunk: Secondary | ICD-10-CM | POA: Diagnosis not present

## 2024-05-31 ENCOUNTER — Ambulatory Visit (INDEPENDENT_AMBULATORY_CARE_PROVIDER_SITE_OTHER): Admitting: Family Medicine

## 2024-05-31 ENCOUNTER — Encounter: Payer: Self-pay | Admitting: Family Medicine

## 2024-05-31 VITALS — BP 137/83 | HR 78 | Resp 16 | Ht 65.0 in | Wt 157.0 lb

## 2024-05-31 DIAGNOSIS — M21372 Foot drop, left foot: Secondary | ICD-10-CM

## 2024-05-31 DIAGNOSIS — Z23 Encounter for immunization: Secondary | ICD-10-CM | POA: Diagnosis not present

## 2024-05-31 DIAGNOSIS — Z9189 Other specified personal risk factors, not elsewhere classified: Secondary | ICD-10-CM

## 2024-05-31 DIAGNOSIS — H6123 Impacted cerumen, bilateral: Secondary | ICD-10-CM

## 2024-05-31 DIAGNOSIS — R7303 Prediabetes: Secondary | ICD-10-CM | POA: Diagnosis not present

## 2024-05-31 DIAGNOSIS — I10 Essential (primary) hypertension: Secondary | ICD-10-CM

## 2024-05-31 DIAGNOSIS — E782 Mixed hyperlipidemia: Secondary | ICD-10-CM | POA: Diagnosis not present

## 2024-05-31 DIAGNOSIS — M8580 Other specified disorders of bone density and structure, unspecified site: Secondary | ICD-10-CM | POA: Diagnosis not present

## 2024-05-31 DIAGNOSIS — E038 Other specified hypothyroidism: Secondary | ICD-10-CM

## 2024-05-31 NOTE — Patient Instructions (Signed)
 F/U in 6 months   Please schedule mammogram at checkout due in March  Commit to 1000 to 1200 mg of calcium  every day for bone building along with weight bearing exercise like walking for 150 mis per week  You will be referred for cordiac calcium  score as we discussed  Flu vaccine today  Covid vaccine is recommended and is at your pharmacy   No medication changes  Next dexa due 07/2025   Thanks for choosing [facility], we consider it a privelige to serve you.

## 2024-06-01 ENCOUNTER — Encounter: Payer: Self-pay | Admitting: Family Medicine

## 2024-06-01 DIAGNOSIS — Z9189 Other specified personal risk factors, not elsewhere classified: Secondary | ICD-10-CM | POA: Insufficient documentation

## 2024-06-01 DIAGNOSIS — Z23 Encounter for immunization: Secondary | ICD-10-CM | POA: Insufficient documentation

## 2024-06-01 NOTE — Assessment & Plan Note (Signed)
 Patient educated about the importance of limiting  Carbohydrate intake , the need to commit to daily physical activity for a minimum of 30 minutes , and to commit weight loss. The fact that changes in all these areas will reduce or eliminate all together the development of diabetes is stressed.      Latest Ref Rng & Units 03/27/2024    9:58 AM 03/27/2024    9:51 AM 09/28/2023   10:43 AM 03/28/2023   10:21 AM 10/30/2022    5:35 AM  Diabetic Labs  Chol 100 - 199 mg/dL  855  850  795    HDL >60 mg/dL  77  79  80    Calc LDL 0 - 99 mg/dL  56  60  883    Triglycerides 0 - 149 mg/dL  47  44  41    Creatinine 0.57 - 1.00 mg/dL 9.28   9.29   9.38       05/31/2024    3:53 PM 04/03/2024   10:52 AM 02/02/2024   11:53 AM 12/25/2023   10:33 AM 11/29/2023    4:58 PM 11/29/2023    4:28 PM 10/02/2023   10:19 AM  BP/Weight  Systolic BP 137 124 127 140 126 144 134  Diastolic BP 83 72 76 86 80 78 82  Wt. (Lbs) 157 159  157  152.04 155  BMI 26.13 kg/m2 26.46 kg/m2  26.13 kg/m2  25.3 kg/m2 25.79 kg/m2       No data to display

## 2024-06-01 NOTE — Assessment & Plan Note (Signed)
 Weightr bearing exercise , calcium  and D3 supplement daily

## 2024-06-01 NOTE — Assessment & Plan Note (Signed)
 After obtaining informed consent, the vaccine is  administered , with no adverse effect noted at the time of administration.

## 2024-06-01 NOTE — Assessment & Plan Note (Signed)
 Controlled, no change in medication DASH diet and commitment to daily physical activity for a minimum of 30 minutes discussed and encouraged, as a part of hypertension management. The importance of attaining a healthy weight is also discussed.     05/31/2024    3:53 PM 04/03/2024   10:52 AM 02/02/2024   11:53 AM 12/25/2023   10:33 AM 11/29/2023    4:58 PM 11/29/2023    4:28 PM 10/02/2023   10:19 AM  BP/Weight  Systolic BP 137 124 127 140 126 144 134  Diastolic BP 83 72 76 86 80 78 82  Wt. (Lbs) 157 159  157  152.04 155  BMI 26.13 kg/m2 26.46 kg/m2  26.13 kg/m2  25.3 kg/m2 25.79 kg/m2     h

## 2024-06-01 NOTE — Assessment & Plan Note (Signed)
 Improved with new orthotic which she obtained from podorthist in past 1 month

## 2024-06-01 NOTE — Progress Notes (Signed)
 Julie May     MRN: [id number]      DOB: 1950/07/17  Chief Complaint  Patient presents with   Medical Management of Chronic Issues    6 month follow up     HPI [name] is here for follow up and re-evaluation of chronic medical conditions, medication management and review of any available recent lab and radiology data.  Preventive health is updated, specifically  Cancer screening and Immunization.   Very happy with the new inserts that she obtained in past 1 month for her left foot  There are no new concerns.  There are no specific complaints  She is invested in regular exercise as well as healthy food choice and feels better as a result  ROS Denies recent fever or chills. Denies sinus pressure, nasal congestion, ear pain or sore throat. Denies chest congestion, productive cough or wheezing. Denies chest pains, palpitations and leg swelling Denies abdominal pain, nausea, vomiting,diarrhea or constipation.   Denies dysuria, frequency, hesitancy or incontinence. Denies headaches, seizures, numbness, or tingling. Denies depression, anxiety or insomnia. Denies skin break down or rash.   PE  BP 137/83   Pulse 78   Resp 16   Ht 5' 5 (1.651 m)   Wt 157 lb (71.2 kg)   SpO2 99%   BMI 26.13 kg/m   Patient alert and oriented and in no cardiopulmonary distress.  HEENT: No facial asymmetry, EOMI,     Neck supple .  Chest: Clear to auscultation bilaterally.  CVS: S1, S2 no murmurs, no S3.Regular rate.  ABD: Soft non tender.   Ext: No edema  MS: Adequate ROM spine, shoulders, hips and knees.  Skin: Intact, no ulcerations or rash noted.  Psych: Good eye contact, normal affect. Memory intact not anxious or depressed appearing.  CNS: CN 2-12 intact, power,  normal throughout.no focal deficits noted.   Assessment & Plan  Influenza vaccination administered at current visit After obtaining informed consent, the vaccine is  administered , with no adverse effect noted at  the time of administration.   Hypothyroidism Managed by Endo and controlled  Essential hypertension, benign Controlled, no change in medication DASH diet and commitment to daily physical activity for a minimum of 30 minutes discussed and encouraged, as a part of hypertension management. The importance of attaining a healthy weight is also discussed.     05/31/2024    3:53 PM 04/03/2024   10:52 AM 02/02/2024   11:53 AM 12/25/2023   10:33 AM 11/29/2023    4:58 PM 11/29/2023    4:28 PM 10/02/2023   10:19 AM  BP/Weight  Systolic BP 137 124 127 140 126 144 134  Diastolic BP 83 72 76 86 80 78 82  Wt. (Lbs) 157 159  157  152.04 155  BMI 26.13 kg/m2 26.46 kg/m2  26.13 kg/m2  25.3 kg/m2 25.79 kg/m2     h   Impacted cerumen of both ears Followed byu ENT on a regular basis fore irrigation  Mixed hyperlipidemia Hyperlipidemia:Low fat diet discussed and encouraged.   Lipid Panel  Lab Results  Component Value Date   CHOL 144 03/27/2024   HDL 77 03/27/2024   LDLCALC 56 03/27/2024   TRIG 47 03/27/2024   CHOLHDL 1.9 03/27/2024     Controlled, no change in medication Updated lab needed at/ before next visit.   Osteopenia Weightr bearing exercise , calcium  and D3 supplement daily  Prediabetes Patient educated about the importance of limiting  Carbohydrate intake , the  need to commit to daily physical activity for a minimum of 30 minutes , and to commit weight loss. The fact that changes in all these areas will reduce or eliminate all together the development of diabetes is stressed.      Latest Ref Rng & Units 03/27/2024    9:58 AM 03/27/2024    9:51 AM 09/28/2023   10:43 AM 03/28/2023   10:21 AM 10/30/2022    5:35 AM  Diabetic Labs  Chol 100 - 199 mg/dL  855  850  795    HDL >60 mg/dL  77  79  80    Calc LDL 0 - 99 mg/dL  56  60  883    Triglycerides 0 - 149 mg/dL  47  44  41    Creatinine 0.57 - 1.00 mg/dL 9.28   9.29   9.38       05/31/2024    3:53 PM 04/03/2024    10:52 AM 02/02/2024   11:53 AM 12/25/2023   10:33 AM 11/29/2023    4:58 PM 11/29/2023    4:28 PM 10/02/2023   10:19 AM  BP/Weight  Systolic BP 137 124 127 140 126 144 134  Diastolic BP 83 72 76 86 80 78 82  Wt. (Lbs) 157 159  157  152.04 155  BMI 26.13 kg/m2 26.46 kg/m2  26.13 kg/m2  25.3 kg/m2 25.79 kg/m2       No data to display            At increased risk for cardiovascular disease Will refer for coronary calcium  scoring  Left foot drop Improved with new orthotic which she obtained from podorthist in past 1 month

## 2024-06-01 NOTE — Assessment & Plan Note (Signed)
 Hyperlipidemia:Low fat diet discussed and encouraged.   Lipid Panel  Lab Results  Component Value Date   CHOL 144 03/27/2024   HDL 77 03/27/2024   LDLCALC 56 03/27/2024   TRIG 47 03/27/2024   CHOLHDL 1.9 03/27/2024     Controlled, no change in medication Updated lab needed at/ before next visit.

## 2024-06-01 NOTE — Assessment & Plan Note (Signed)
Managed by Endo and controlled 

## 2024-06-01 NOTE — Assessment & Plan Note (Signed)
 Followed byu ENT on a regular basis fore irrigation

## 2024-06-01 NOTE — Assessment & Plan Note (Signed)
 Will refer for coronary calcium  scoring

## 2024-06-04 ENCOUNTER — Other Ambulatory Visit (HOSPITAL_COMMUNITY): Payer: Self-pay

## 2024-06-04 ENCOUNTER — Other Ambulatory Visit: Payer: Self-pay

## 2024-06-05 ENCOUNTER — Other Ambulatory Visit: Payer: Self-pay

## 2024-06-05 ENCOUNTER — Other Ambulatory Visit (HOSPITAL_COMMUNITY): Payer: Self-pay

## 2024-06-07 ENCOUNTER — Other Ambulatory Visit: Payer: Self-pay

## 2024-06-07 ENCOUNTER — Other Ambulatory Visit (HOSPITAL_COMMUNITY): Payer: Self-pay

## 2024-06-11 ENCOUNTER — Other Ambulatory Visit (HOSPITAL_COMMUNITY): Payer: Self-pay | Admitting: Family Medicine

## 2024-06-11 DIAGNOSIS — N63 Unspecified lump in unspecified breast: Secondary | ICD-10-CM

## 2024-06-11 DIAGNOSIS — R928 Other abnormal and inconclusive findings on diagnostic imaging of breast: Secondary | ICD-10-CM

## 2024-06-17 ENCOUNTER — Other Ambulatory Visit: Payer: Self-pay | Admitting: "Endocrinology

## 2024-06-17 ENCOUNTER — Encounter: Payer: Self-pay | Admitting: "Endocrinology

## 2024-06-17 ENCOUNTER — Other Ambulatory Visit: Payer: Self-pay

## 2024-06-17 DIAGNOSIS — I10 Essential (primary) hypertension: Secondary | ICD-10-CM

## 2024-06-17 MED ORDER — LOSARTAN POTASSIUM 25 MG PO TABS: 25.0000 mg | ORAL_TABLET | Freq: Every day | ORAL | 0 refills | Status: AC

## 2024-06-24 ENCOUNTER — Ambulatory Visit (HOSPITAL_COMMUNITY)
Admission: RE | Admit: 2024-06-24 | Discharge: 2024-06-24 | Disposition: A | Payer: Self-pay | Source: Ambulatory Visit | Attending: Family Medicine | Admitting: Family Medicine

## 2024-06-24 DIAGNOSIS — E782 Mixed hyperlipidemia: Secondary | ICD-10-CM

## 2024-06-24 DIAGNOSIS — I10 Essential (primary) hypertension: Secondary | ICD-10-CM

## 2024-06-24 DIAGNOSIS — Z9189 Other specified personal risk factors, not elsewhere classified: Secondary | ICD-10-CM

## 2024-06-24 DIAGNOSIS — R7303 Prediabetes: Secondary | ICD-10-CM

## 2024-06-25 ENCOUNTER — Ambulatory Visit: Payer: Self-pay | Admitting: Family Medicine

## 2024-06-25 DIAGNOSIS — R931 Abnormal findings on diagnostic imaging of heart and coronary circulation: Secondary | ICD-10-CM

## 2024-07-05 ENCOUNTER — Other Ambulatory Visit (HOSPITAL_COMMUNITY): Payer: Self-pay

## 2024-07-08 ENCOUNTER — Other Ambulatory Visit: Payer: Self-pay

## 2024-07-10 ENCOUNTER — Other Ambulatory Visit: Payer: Self-pay

## 2024-07-10 ENCOUNTER — Other Ambulatory Visit (HOSPITAL_COMMUNITY): Payer: Self-pay

## 2024-07-15 ENCOUNTER — Encounter: Payer: Self-pay | Admitting: "Endocrinology

## 2024-07-16 ENCOUNTER — Other Ambulatory Visit (HOSPITAL_COMMUNITY): Payer: Self-pay

## 2024-07-16 ENCOUNTER — Telehealth: Payer: Self-pay

## 2024-07-16 NOTE — Telephone Encounter (Signed)
 Pharmacy Patient Advocate Encounter   Received notification from Pt Calls Messages that prior authorization for Tirosint  capsules is required/requested.   Insurance verification completed.   The patient is insured through [facility].   Per test claim: PA required and submitted KEY/EOC/Request #: [id number] from 07/11/2024 to 07/10/2025

## 2024-07-22 ENCOUNTER — Other Ambulatory Visit (HOSPITAL_COMMUNITY): Payer: Self-pay

## 2024-07-22 NOTE — Telephone Encounter (Signed)
 Patient is calling the office for a update on the PA. Do you have any information?

## 2024-07-22 NOTE — Telephone Encounter (Signed)
 PA was approved. Please see Blakely's message below.

## 2024-07-22 NOTE — Telephone Encounter (Signed)
 Patient was called and a message was left letting the patient know that she had been approved.

## 2024-07-22 NOTE — Telephone Encounter (Signed)
 Patient will be needing a refill soon.

## 2024-08-05 ENCOUNTER — Ambulatory Visit (INDEPENDENT_AMBULATORY_CARE_PROVIDER_SITE_OTHER): Admitting: Otolaryngology

## 2024-08-05 ENCOUNTER — Other Ambulatory Visit: Payer: Self-pay

## 2024-08-07 ENCOUNTER — Other Ambulatory Visit (HOSPITAL_COMMUNITY): Payer: Self-pay

## 2024-08-07 ENCOUNTER — Other Ambulatory Visit: Payer: Self-pay

## 2024-08-08 ENCOUNTER — Other Ambulatory Visit (HOSPITAL_COMMUNITY): Payer: Self-pay

## 2024-08-08 ENCOUNTER — Other Ambulatory Visit: Payer: Self-pay

## 2024-08-09 ENCOUNTER — Other Ambulatory Visit: Payer: Self-pay

## 2024-08-12 ENCOUNTER — Ambulatory Visit: Payer: Medicare Other

## 2024-08-12 ENCOUNTER — Other Ambulatory Visit: Payer: Self-pay

## 2024-08-13 ENCOUNTER — Other Ambulatory Visit (HOSPITAL_COMMUNITY): Payer: Self-pay

## 2024-09-12 ENCOUNTER — Ambulatory Visit: Admitting: Cardiovascular Disease

## 2024-09-18 ENCOUNTER — Ambulatory Visit (INDEPENDENT_AMBULATORY_CARE_PROVIDER_SITE_OTHER): Admitting: Otolaryngology

## 2024-10-02 ENCOUNTER — Ambulatory Visit: Admitting: "Endocrinology

## 2024-10-08 ENCOUNTER — Encounter (HOSPITAL_COMMUNITY)

## 2024-10-08 ENCOUNTER — Other Ambulatory Visit (HOSPITAL_COMMUNITY)

## 2024-11-28 ENCOUNTER — Ambulatory Visit: Admitting: Family Medicine
